# Patient Record
Sex: Female | Born: 1970 | Race: Black or African American | Hispanic: No | Smoking: Never smoker
Health system: Southern US, Community
[De-identification: ages and names within clinical notes are randomized; demographics above are authoritative.]

## PROBLEM LIST (undated history)

## (undated) DIAGNOSIS — Z8742 Personal history of other diseases of the female genital tract: Secondary | ICD-10-CM

## (undated) DIAGNOSIS — IMO0002 Reserved for concepts with insufficient information to code with codable children: Secondary | ICD-10-CM

## (undated) DIAGNOSIS — M329 Systemic lupus erythematosus, unspecified: Secondary | ICD-10-CM

## (undated) DIAGNOSIS — J329 Chronic sinusitis, unspecified: Secondary | ICD-10-CM

## (undated) HISTORY — PX: OTHER SURGICAL HISTORY: SHX169

## (undated) HISTORY — PX: DILATION AND CURETTAGE OF UTERUS: SHX78

## (undated) HISTORY — DX: Reserved for concepts with insufficient information to code with codable children: IMO0002

## (undated) HISTORY — DX: Chronic sinusitis, unspecified: J32.9

## (undated) HISTORY — PX: TONSILLECTOMY: SUR1361

## (undated) HISTORY — DX: Personal history of other diseases of the female genital tract: Z87.42

## (undated) HISTORY — DX: Systemic lupus erythematosus, unspecified: M32.9

---

## 1998-03-28 ENCOUNTER — Ambulatory Visit (HOSPITAL_COMMUNITY): Admission: RE | Admit: 1998-03-28 | Discharge: 1998-03-28 | Payer: Self-pay | Admitting: Internal Medicine

## 1998-05-17 ENCOUNTER — Emergency Department (HOSPITAL_COMMUNITY): Admission: EM | Admit: 1998-05-17 | Discharge: 1998-05-17 | Payer: Self-pay | Admitting: Emergency Medicine

## 2001-08-05 ENCOUNTER — Other Ambulatory Visit: Admission: RE | Admit: 2001-08-05 | Discharge: 2001-08-05 | Payer: Self-pay | Admitting: Obstetrics & Gynecology

## 2003-07-24 ENCOUNTER — Other Ambulatory Visit: Admission: RE | Admit: 2003-07-24 | Discharge: 2003-07-24 | Payer: Self-pay | Admitting: Obstetrics & Gynecology

## 2004-08-30 ENCOUNTER — Encounter: Admission: RE | Admit: 2004-08-30 | Discharge: 2004-08-30 | Payer: Self-pay | Admitting: Gastroenterology

## 2005-08-29 ENCOUNTER — Other Ambulatory Visit: Admission: RE | Admit: 2005-08-29 | Discharge: 2005-08-29 | Payer: Self-pay | Admitting: Family Medicine

## 2006-11-20 ENCOUNTER — Ambulatory Visit (HOSPITAL_COMMUNITY): Admission: RE | Admit: 2006-11-20 | Discharge: 2006-11-20 | Payer: Self-pay | Admitting: Obstetrics & Gynecology

## 2006-11-20 ENCOUNTER — Encounter (INDEPENDENT_AMBULATORY_CARE_PROVIDER_SITE_OTHER): Payer: Self-pay | Admitting: Obstetrics & Gynecology

## 2008-01-18 ENCOUNTER — Ambulatory Visit (HOSPITAL_COMMUNITY): Admission: RE | Admit: 2008-01-18 | Discharge: 2008-01-18 | Payer: Self-pay | Admitting: Gastroenterology

## 2008-03-25 ENCOUNTER — Ambulatory Visit: Payer: Self-pay | Admitting: Interventional Radiology

## 2008-03-25 ENCOUNTER — Emergency Department (HOSPITAL_BASED_OUTPATIENT_CLINIC_OR_DEPARTMENT_OTHER): Admission: EM | Admit: 2008-03-25 | Discharge: 2008-03-25 | Payer: Self-pay | Admitting: Emergency Medicine

## 2008-06-05 ENCOUNTER — Ambulatory Visit: Payer: Self-pay | Admitting: Diagnostic Radiology

## 2008-06-05 ENCOUNTER — Emergency Department (HOSPITAL_BASED_OUTPATIENT_CLINIC_OR_DEPARTMENT_OTHER): Admission: EM | Admit: 2008-06-05 | Discharge: 2008-06-05 | Payer: Self-pay | Admitting: Emergency Medicine

## 2009-01-30 ENCOUNTER — Encounter: Admission: RE | Admit: 2009-01-30 | Discharge: 2009-01-30 | Payer: Self-pay | Admitting: Specialist

## 2009-02-21 ENCOUNTER — Encounter: Admission: RE | Admit: 2009-02-21 | Discharge: 2009-02-21 | Payer: Self-pay | Admitting: Family Medicine

## 2009-05-03 ENCOUNTER — Encounter: Admission: RE | Admit: 2009-05-03 | Discharge: 2009-05-03 | Payer: Self-pay | Admitting: Family Medicine

## 2009-05-16 ENCOUNTER — Encounter: Admission: RE | Admit: 2009-05-16 | Discharge: 2009-05-16 | Payer: Self-pay | Admitting: Neurological Surgery

## 2009-08-15 ENCOUNTER — Encounter: Admission: RE | Admit: 2009-08-15 | Discharge: 2009-08-15 | Payer: Self-pay | Admitting: Family Medicine

## 2010-03-30 ENCOUNTER — Emergency Department (HOSPITAL_BASED_OUTPATIENT_CLINIC_OR_DEPARTMENT_OTHER)
Admission: EM | Admit: 2010-03-30 | Discharge: 2010-03-30 | Payer: Self-pay | Source: Home / Self Care | Admitting: Emergency Medicine

## 2010-04-11 ENCOUNTER — Emergency Department (HOSPITAL_BASED_OUTPATIENT_CLINIC_OR_DEPARTMENT_OTHER)
Admission: EM | Admit: 2010-04-11 | Discharge: 2010-04-11 | Payer: Self-pay | Source: Home / Self Care | Admitting: Emergency Medicine

## 2010-04-21 ENCOUNTER — Encounter: Payer: Self-pay | Admitting: Specialist

## 2010-04-21 ENCOUNTER — Encounter: Payer: Self-pay | Admitting: Family Medicine

## 2010-08-13 NOTE — Op Note (Signed)
NAME:  Jennifer Erickson, Jennifer Erickson        ACCOUNT NO.:  [id number]   MEDICAL RECORD NO.:  [id number]          PATIENT TYPE:  AMB   LOCATION:  SDC                           FACILITY:  [facility]   PHYSICIAN:  Genia Del, M.D.DATE OF BIRTH:  January 25, 1971   DATE OF PROCEDURE:  11/20/2006  DATE OF DISCHARGE:                               OPERATIVE REPORT   PREOPERATIVE DIAGNOSIS:  Menorrhagia with thick endometrium.   POSTOPERATIVE DIAGNOSIS:  Menorrhagia with thick endometrium.   PROCEDURE:  Hysteroscopy with dilatation and curettage.   SURGEON:  Genia Del, M.D.   ASSISTANT:  None.   ANESTHESIOLOGIST:  Angelica Pou, M.D.   DESCRIPTION OF PROCEDURE:  Under general anesthesia with endotracheal  intubation, the patient was placed in the lithotomy position.  She was  prepped with Betadine on the suprapubic, vulvar, and vaginal areas.  The  bladder is catheterized and the patient is draped as usual.  Vaginal  exam under general anesthesia reveals an anteverted uterus, mobile,  normal volume, no adnexal mass.  The cervix is long and closed.  No  vaginal bleeding is present.  We introduce the speculum in the vagina.  The anterior lip of the cervix is grasped with a tenaculum.  We proceed  with dilatation of the cervix with Hegar dilators up to #31 without  difficulty.  We then insert the diagnostic hysteroscope in the  intrauterine cavity.  The intrauterine cavity is inspected thoroughly.  Pictures are taken.  Both ostia are seen.  No frank lesion is present.  No evidence of myoma.  No large polyp. The endometrium is thick, small  polyps could be present within the thick endometrium. We decide to  proceed with curettage.  We, therefore, removed the hysteroscope.  We  used a sharp curet and systematically curettaged the entire intrauterine  cavity.  Endometrial curettings are sent to pathology.  We then go back  with the hysteroscope and visualize the entire intrauterine cavity.  No  lesion is visible. Pictures are taken of the intrauterine cavity and  also of the endocervix. Hemostasis is adequate.  All instruments are  removed. The estimated blood loss was minimal.  The fluid deficit was 20  mL.  No complications occurred.  The patient was brought to the recovery  room in good stable status.      Genia Del, M.D.  Electronically Signed     ML/MEDQ  D:  11/20/2006  T:  11/21/2006  Job:  [id number]

## 2011-01-10 LAB — PREGNANCY, URINE: Preg Test, Ur: NEGATIVE

## 2011-01-10 LAB — CBC
RBC: 4.35
WBC: 11.1 — ABNORMAL HIGH

## 2011-03-14 ENCOUNTER — Other Ambulatory Visit (HOSPITAL_COMMUNITY): Payer: Self-pay | Admitting: Otolaryngology

## 2011-03-14 ENCOUNTER — Other Ambulatory Visit (HOSPITAL_COMMUNITY): Payer: Self-pay | Admitting: *Deleted

## 2011-03-14 DIAGNOSIS — R4702 Dysphasia: Secondary | ICD-10-CM

## 2011-03-14 DIAGNOSIS — M542 Cervicalgia: Secondary | ICD-10-CM

## 2011-03-14 DIAGNOSIS — R220 Localized swelling, mass and lump, head: Secondary | ICD-10-CM

## 2011-03-17 ENCOUNTER — Inpatient Hospital Stay (HOSPITAL_COMMUNITY): Admission: RE | Admit: 2011-03-17 | Payer: Self-pay | Source: Ambulatory Visit

## 2011-03-17 ENCOUNTER — Other Ambulatory Visit (HOSPITAL_COMMUNITY): Payer: Self-pay

## 2011-03-17 ENCOUNTER — Ambulatory Visit (HOSPITAL_COMMUNITY): Payer: Self-pay

## 2011-07-16 ENCOUNTER — Encounter: Payer: Self-pay | Admitting: *Deleted

## 2011-07-16 ENCOUNTER — Emergency Department (INDEPENDENT_AMBULATORY_CARE_PROVIDER_SITE_OTHER)
Admission: EM | Admit: 2011-07-16 | Discharge: 2011-07-16 | Disposition: A | Discharge status: Home / Self Care | Payer: Federal, State, Local not specified - PPO | Source: Home / Self Care | Attending: Family Medicine | Admitting: Family Medicine

## 2011-07-16 DIAGNOSIS — R109 Unspecified abdominal pain: Secondary | ICD-10-CM

## 2011-07-16 DIAGNOSIS — R110 Nausea: Secondary | ICD-10-CM

## 2011-07-16 LAB — POCT URINALYSIS DIPSTICK
Bilirubin, UA: NEGATIVE
Ketones, UA: NEGATIVE
Leukocytes, UA: NEGATIVE
Nitrite, UA: NEGATIVE

## 2011-07-16 LAB — POCT CBC W AUTO DIFF (K'VILLE URGENT CARE)

## 2011-07-16 LAB — POCT URINE PREGNANCY: Preg Test, Ur: NEGATIVE

## 2011-07-16 MED ORDER — SULFAMETHOXAZOLE-TRIMETHOPRIM 800-160 MG PO TABS: 1.0000 | ORAL_TABLET | Freq: Two times a day (BID) | ORAL | Status: AC

## 2011-07-16 MED ORDER — ONDANSETRON HCL 4 MG PO TABS: 4.0000 mg | ORAL_TABLET | Freq: Four times a day (QID) | ORAL | Status: AC

## 2011-07-16 MED ORDER — METRONIDAZOLE 500 MG PO TABS: ORAL_TABLET | ORAL | Status: DC

## 2011-07-16 NOTE — ED Notes (Signed)
Patient c/o nausea, abdominal cramps and diarrhea x 2 days. She has not eaten anything today but has had fluids.

## 2011-07-16 NOTE — Discharge Instructions (Signed)
Begin clear liquids for 24 hours, then advance to BRAT diet if tolerated.  Gradually advance to a regular diet. If symptoms become significantly worse during the night or over the weekend, proceed to the local emergency room.   Clear Liquid Diet The clear liquid dietconsists of foods that are liquid or will become liquid at room temperature.You should be able to see through the liquid and beverages. Examples of foods allowed on a clear liquid diet include fruit juice, broth or bouillon, gelatin, or frozen ice pops. The purpose of this diet is to provide necessary fluid, electrolytes such as sodium and potassium, and energy to keep the body functioning during times when you are not able to consume a regular diet.A clear liquid diet should not be continued for long periods of time as it is not nutritionally adequate.  REASONS FOR USING A CLEAR LIQUID DIET  In sudden onset (acute) conditions for a patient before or after surgery.   As the first step in oral feeding.   For fluid and electrolyte replacement in diarrheal diseases.   As a diet before certain medical tests are performed.  ADEQUACY The clear liquid diet is adequate only in ascorbic acid, according to the Recommended Dietary Allowances of the [employer]. CHOOSING FOODS Breads and Starches  Allowed:  None are allowed.   Avoid: All are avoided.  Vegetables  Allowed:  Strained tomato or vegetable juice.   Avoid: Any others.  Fruit  Allowed:  Strained fruit juices and fruit drinks. Include 1 serving of citrus or vitamin C-enriched fruit juice daily.   Avoid: Any others.  Meat and Meat Substitutes  Allowed:  None are allowed.   Avoid: All are avoided.  Milk  Allowed:  None are allowed.   Avoid: All are avoided.  Soups and Combination Foods  Allowed:  Clear bouillon, broth, or strained broth-based soups.   Avoid: Any others.  Desserts and Sweets  Allowed:  Sugar, honey. High protein gelatin.  Flavored gelatin, ices, or frozen ice pops that do not contain milk.   Avoid: Any others.  Fats and Oils  Allowed:  None are allowed.   Avoid: All are avoided.  Beverages  Allowed: Cereal beverages, coffee (regular or decaffeinated), tea, or soda at the discretion of your caregiver.   Avoid: Any others.  Condiments  Allowed:  Iodized salt.   Avoid: Any others, including pepper.  Supplements  Allowed:  Liquid nutrition beverages.   Avoid: Any others that contain lactose or fiber.  SAMPLE MEAL PLAN Breakfast  4 oz (120 mL) strained orange juice.    to 1 cup (125 to 250 mL) gelatin (plain or fortified).   1 cup (250 mL) beverage (coffee or tea).   Sugar, if desired.  Midmorning Snack   cup (125 mL) gelatin (plain or fortified).  Lunch  1 cup (250 mL) broth or consomm.   4 oz (120 mL) strained grapefruit juice.    cup (125 mL) gelatin (plain or fortified).   1 cup (250 mL) beverage (coffee or tea).   Sugar, if desired.  Midafternoon Snack   cup (125 mL) fruit ice.    cup (125 mL) strained fruit juice.  Dinner  1 cup (250 mL) broth or consomm.    cup (125 mL) cranberry juice.    cup (125 mL) flavored gelatin (plain or fortified).   1 cup (250 mL) beverage (coffee or tea).   Sugar, if desired.  Evening Snack  4 oz (120 mL) strained apple juice (  vitamin C-fortified).    cup (125 mL) flavored gelatin (plain or fortified).  Document Released: 03/17/2005 Document Revised: 03/06/2011 Document Reviewed: 06/14/2010 [facility] Patient Information 2012 [place], [place].   B.R.A.T. Diet Your doctor has recommended the B.R.A.T. diet for you or your child until the condition improves. This is often used to help control diarrhea and vomiting symptoms. If you or your child can tolerate clear liquids, you may have:  Bananas.   Rice.   Applesauce.   Toast (and other simple starches such as crackers, potatoes, noodles).  Be sure to avoid dairy  products, meats, and fatty foods until symptoms are better. Fruit juices such as apple, grape, and prune juice can make diarrhea worse. Avoid these. Continue this diet for 2 days or as instructed by your caregiver. Document Released: 03/17/2005 Document Revised: 03/06/2011 Document Reviewed: 09/03/2006 [facility] Patient Information 2012 [place], [place].    Abdominal Pain Abdominal pain can be caused by many things. Your caregiver decides the seriousness of your pain by an examination and possibly blood tests and X-rays. Many cases can be observed and treated at home. Most abdominal pain is not caused by a disease and will probably improve without treatment. However, in many cases, more time must pass before a clear cause of the pain can be found. Before that point, it may not be known if you need more testing, or if hospitalization or surgery is needed. HOME CARE INSTRUCTIONS   Do not take laxatives unless directed by your caregiver.   Take pain medicine only as directed by your caregiver.   Only take over-the-counter or prescription medicines for pain, discomfort, or fever as directed by your caregiver.   Try a clear liquid diet (broth, tea, or water) for as long as directed by your caregiver. Slowly move to a bland diet as tolerated.  SEEK IMMEDIATE MEDICAL CARE IF:   The pain does not go away.   You have a fever.   You keep throwing up (vomiting).   The pain is felt only in portions of the abdomen. Pain in the right side could possibly be appendicitis. In an adult, pain in the left lower portion of the abdomen could be colitis or diverticulitis.   You pass bloody or black tarry stools.  MAKE SURE YOU:   Understand these instructions.   Will watch your condition.   Will get help right away if you are not doing well or get worse.  Document Released: 12/25/2004 Document Revised: 03/06/2011 Document Reviewed: 11/03/2007 [facility] Patient Information 2012 [place], [place].

## 2011-07-16 NOTE — ED Provider Notes (Signed)
History     CSN: [id number]  Arrival date & time 07/16/11  1613   First MD Initiated Contact with Patient 07/16/11 1634      Chief Complaint  Patient presents with  . Diarrhea  . Nausea     HPI Comments: Patient complains of onset of lower abdominal cramps 2 days ago with tenesmus, but no diarrhea.  She has had several bowel movements that were small but otherwise normal.  Today she developed nausea without vomiting.  No fevers, chills, and sweats.  No urinary symptoms.  She states that she normally does not have menstrual periods (on estrostep)  Patient is a 41 y.o. female presenting with abdominal pain. The history is provided by the patient.  Abdominal Pain The primary symptoms of the illness include abdominal pain, fatigue and nausea. The primary symptoms of the illness do not include fever, vomiting, diarrhea or dysuria. The current episode started 2 days ago. The onset of the illness was sudden.  The patient states that she believes she is currently not pregnant. The patient has had a change in bowel habit. Additional symptoms associated with the illness include anorexia. Symptoms associated with the illness do not include chills, diaphoresis, heartburn, constipation, urgency, hematuria, frequency or back pain.    History reviewed. No pertinent past medical history.  Past Surgical History  Procedure Date  . Dilation and curettage of uterus   . Tonsillectomy     History reviewed. No pertinent family history.  History  Substance Use Topics  . Smoking status: Never Smoker   . Smokeless tobacco: Not on file  . Alcohol Use: No    OB History    Grav Para Term Preterm Abortions TAB SAB Ect Mult Living                  Review of Systems  Constitutional: Positive for appetite change and fatigue. Negative for fever, chills and diaphoresis.  HENT: Negative.   Eyes: Negative.   Respiratory: Negative.   Cardiovascular: Negative.   Gastrointestinal: Positive for nausea,  abdominal pain, abdominal distention and anorexia. Negative for heartburn, vomiting, diarrhea, constipation, blood in stool and rectal pain.  Genitourinary: Negative for dysuria, urgency, frequency, hematuria, flank pain, decreased urine volume, vaginal pain and pelvic pain.  Musculoskeletal: Negative.  Negative for back pain.  Skin: Negative.   Neurological: Negative for headaches.    Allergies  Aspirin  Home Medications   Current Outpatient Rx  Name Route Sig Dispense Refill  . NORETHINDRON-ETHINYL ESTRAD-FE 1-20/1-30/1-35 MG-MCG PO TABS Oral Take 1 tablet by mouth daily.    [name] METRONIDAZOLE 500 MG PO TABS  Take one tab by mouth Q6hr 28 tablet 0  . ONDANSETRON HCL 4 MG PO TABS Oral Take 1 tablet (4 mg total) by mouth every 6 (six) hours. as needed for nausea 12 tablet 0  . SULFAMETHOXAZOLE-TRIMETHOPRIM 800-160 MG PO TABS Oral Take 1 tablet by mouth 2 (two) times daily. 14 tablet 0    BP 126/87  Pulse 88  Temp(Src) 98.5 F (36.9 C) (Oral)  Resp 16  Ht 5\' 6"  (1.676 m)  Wt 274 lb (124.286 kg)  BMI 44.22 kg/m2  SpO2 97%  Physical Exam Nursing notes and Vital Signs reviewed. Appearance:  Patient appears stated age, and in no acute distress.  Patient is obese (BMI 44.3)  Eyes:  Pupils are equal, round, and reactive to light and accomodation.  Extraocular movement is intact.  Conjunctivae are not inflamed  Pharynx:  Normal Neck:  Supple.  No adenopathy Lungs:  Clear to auscultation.  Breath sounds are equal.  Heart:  Regular rate and rhythm without murmurs, rubs, or gallops.  Abdomen:   Obese.  No masses or hepatosplenomegaly.  Bowel sounds are present.  There is tenderness in the hypogastric regions bilaterally.  No rebound tenderness.  No CVA or flank tenderness.  Extremities:  No edema.  No calf tenderness Skin:  No rash present.   ED Course  Procedures none   Labs Reviewed  POCT CBC W AUTO DIFF ([facility]) :  WBC 7.0; LY 48.2; MO 2.8; GR 49.0; Hgb 13.6;  Platelets 271   POCT URINALYSIS DIPSTICK trace blood, trace prot, SG = 1.025, otherwise negative  POCT URINE PREGNANCY negative      1. Abdominal pain; ? Diverticulitis early  2. Nausea alone       MDM   Begin Septra DS and Flagyl for one week.  Zofran for nausea. Begin clear liquids for 24 hours, then advance to BRAT diet if tolerated.  Gradually advance to a regular diet. If symptoms become significantly worse during the night or over the weekend, proceed to the local emergency room. Followup with Family Doctor if not improved in 3 days.         Lattie Haw, MD 07/19/11 [phone]

## 2011-07-28 ENCOUNTER — Emergency Department
Admission: EM | Admit: 2011-07-28 | Discharge: 2011-07-28 | Disposition: A | Discharge status: Home / Self Care | Payer: Federal, State, Local not specified - PPO | Source: Home / Self Care

## 2011-07-28 DIAGNOSIS — R059 Cough, unspecified: Secondary | ICD-10-CM

## 2011-07-28 DIAGNOSIS — R05 Cough: Secondary | ICD-10-CM

## 2011-07-28 LAB — POCT INFLUENZA A/B
Influenza A, POC: NEGATIVE
Influenza B, POC: NEGATIVE

## 2011-07-28 MED ORDER — FLUTICASONE PROPIONATE 50 MCG/ACT NA SUSP: 2.0000 | Freq: Every day | NASAL | Status: DC

## 2011-07-28 MED ORDER — METHYLPREDNISOLONE ACETATE 80 MG/ML IJ SUSP
80.0000 mg | Freq: Once | INTRAMUSCULAR | Status: AC
  Administered 2011-07-28: 80 mg via INTRAMUSCULAR

## 2011-07-28 MED ORDER — CETIRIZINE HCL 10 MG PO CAPS: 10.0000 mg | ORAL_CAPSULE | Freq: Every day | ORAL | Status: DC

## 2011-07-28 NOTE — Discharge Instructions (Signed)
Viremia Your exam shows you have a viral illness. Viremia means your symptoms are due to the presence of the virus in your blood. This will often cause a chill or sweat. Other common symptoms of viral infections include fever, muscle aches, headache, fatigue, stomach upsets, sore throat, and dry cough. Antibiotics are not effective in viral illnesses; they are usually only given when there is a secondary bacterial infection. General treatment includes bed rest, increasing oral fluid intake of clear, non-caffeinated drinks like ginger ale, fruit juices, water, or sports drinks. Medicines to relieve specific symptoms such as cough, pain, or diarrhea may also be prescribed. Only take over-the-counter or prescription medicines for pain, discomfort, or fever as directed by your caregiver.  Please call your doctor if you are not better after 2 to 3 days of symptom treatment. Call or return here right away if your illness gets more severe, or you develop any other new symptoms, such as a fever above 103 F (39.4 C), vomiting for more than a day, severe headache or other pain, stiff neck, trouble breathing, visual problems, "blackouts" or fainting. Document Released: 04/24/2004 Document Revised: 03/06/2011 Document Reviewed: 03/17/2005 [facility] Patient Information 2012 [place], [place].  Allergic Rhinitis Allergic rhinitis is when the mucous membranes in the nose respond to allergens. Allergens are particles in the air that cause your body to have an allergic reaction. This causes you to release allergic antibodies. Through a chain of events, these eventually cause you to release histamine into the blood stream (hence the use of antihistamines). Although meant to be protective to the body, it is this release that causes your discomfort, such as frequent sneezing, congestion and an itchy runny nose.  CAUSES  The pollen allergens may come from grasses, trees, and weeds. This is seasonal allergic rhinitis, or "hay  fever." Other allergens cause year-round allergic rhinitis (perennial allergic rhinitis) such as house dust mite allergen, pet dander and mold spores.  SYMPTOMS   Nasal stuffiness (congestion).   Runny, itchy nose with sneezing and tearing of the eyes.   There is often an itching of the mouth, eyes and ears.  It cannot be cured, but it can be controlled with medications. DIAGNOSIS  If you are unable to determine the offending allergen, skin or blood testing may find it. TREATMENT   Avoid the allergen.   Medications and allergy shots (immunotherapy) can help.   Hay fever may often be treated with antihistamines in pill or nasal spray forms. Antihistamines block the effects of histamine. There are over-the-counter medicines that may help with nasal congestion and swelling around the eyes. Check with your caregiver before taking or giving this medicine.  If the treatment above does not work, there are many new medications your caregiver can prescribe. Stronger medications may be used if initial measures are ineffective. Desensitizing injections can be used if medications and avoidance fails. Desensitization is when a patient is given ongoing shots until the body becomes less sensitive to the allergen. Make sure you follow up with your caregiver if problems continue. SEEK MEDICAL CARE IF:   You develop fever (more than 100.5 F (38.1 C).   You develop a cough that does not stop easily (persistent).   You have shortness of breath.   You start wheezing.   Symptoms interfere with normal daily activities.  Document Released: 12/10/2000 Document Revised: 03/06/2011 Document Reviewed: 06/21/2008 [facility] Patient Information 2012 [place], [place].

## 2011-07-28 NOTE — ED Notes (Signed)
Symptoms started on Tuesday w/out relief from OTC meds

## 2011-07-28 NOTE — ED Provider Notes (Signed)
History     CSN: [id number]  Arrival date & time 07/28/11  1949   First MD Initiated Contact with Patient 07/28/11 1952      Chief Complaint  Patient presents with  . URI    (Consider location/radiation/quality/duration/timing/severity/associated sxs/prior treatment) HPI Comments: URI Symptoms Onset: 6 days  Description: rhinorrhea, nasal congestion, cough, swollen lymph nodes  Modifying factors:  None   Symptoms Nasal discharge: yes  Fever: no Sore throat: yes Cough: yes Wheezing: no Ear pain: no GI symptoms: no Sick contacts: yes  Red Flags  Stiff neck: no Dyspnea: no Rash: no Swallowing difficulty: no  Sinusitis Risk Factors Headache/face pain: no Double sickening: no tooth pain: no  Allergy Risk Factors Sneezing: no Itchy scratchy throat: yes Seasonal symptoms: yes  Flu Risk Factors Headache: yes muscle aches: no severe fatigue: yes + chills   -Pt has used claritin with mild to moderate improvement in sxs.    Patient is a 41 y.o. female presenting with URI. The history is provided by the patient.  URI The primary symptoms include fatigue, swollen glands and cough. Primary symptoms do not include fever. The current episode started 6 to 7 days ago. This is a new problem.  Symptoms associated with the illness include congestion and rhinorrhea.    History reviewed. No pertinent past medical history.  Past Surgical History  Procedure Date  . Dilation and curettage of uterus   . Tonsillectomy     History reviewed. No pertinent family history.  History  Substance Use Topics  . Smoking status: Never Smoker   . Smokeless tobacco: Not on file  . Alcohol Use: No    OB History    Grav Para Term Preterm Abortions TAB SAB Ect Mult Living                  Review of Systems  Constitutional: Positive for fatigue. Negative for fever.  HENT: Positive for congestion, rhinorrhea and postnasal drip.   Eyes: Positive for itching.  Respiratory:  Positive for cough.   Cardiovascular: Negative for palpitations.    Allergies  Aspirin  Home Medications   Current Outpatient Rx  Name Route Sig Dispense Refill  . CETIRIZINE HCL 10 MG PO CAPS Oral Take 1 capsule (10 mg total) by mouth daily. 30 capsule 2  . FLUTICASONE PROPIONATE 50 MCG/ACT NA SUSP Nasal Place 2 sprays into the nose daily. 16 g 2  . METRONIDAZOLE 500 MG PO TABS  Take one tab by mouth Q6hr 28 tablet 0  . NORETHINDRON-ETHINYL ESTRAD-FE 1-20/1-30/1-35 MG-MCG PO TABS Oral Take 1 tablet by mouth daily.      BP 134/90  Pulse 93  Temp(Src) 98.4 F (36.9 C) (Oral)  Resp 24  Ht 5\' 6"  (1.676 m)  Wt 271 lb (122.925 kg)  BMI 43.74 kg/m2  SpO2 97%  Physical Exam  Constitutional: She appears well-developed and well-nourished.  HENT:  Head: Normocephalic and atraumatic.       +nasal erythema, rhinorrhea bilaterally, + post oropharyngeal erythema    Eyes: Conjunctivae are normal. Pupils are equal, round, and reactive to light.  Neck:       Large neck girth   Cardiovascular: Normal rate and regular rhythm.   Pulmonary/Chest: Effort normal and breath sounds normal.  Musculoskeletal: Normal range of motion.  Lymphadenopathy:    She has cervical adenopathy.  Neurological: She is alert.  Skin: Skin is warm. No pallor.    ED Course  Procedures (including critical care time)  Labs  Reviewed - No data to display No results found.   No diagnosis found.    MDM  Likely viral illness with overlapping allergic sxs. Centor criteria score of 1. Discussed supportive care, infectious, and resp red flags. Zyrtec and flonase for allergic rhinitis. Depo medrol 80mg  IM x1 to help sinus inflammation. F/u w/ PCP in 2-3 days if not improved.         Floydene Flock, MD 07/28/11 2014  Floydene Flock, MD 07/28/11 2017

## 2011-07-29 NOTE — ED Provider Notes (Signed)
Agree with exam, assessment, and plan.   Lattie Haw, MD 07/29/11 [phone]

## 2011-08-05 ENCOUNTER — Emergency Department
Admission: EM | Admit: 2011-08-05 | Discharge: 2011-08-05 | Disposition: A | Discharge status: Home / Self Care | Payer: Federal, State, Local not specified - PPO | Source: Home / Self Care | Attending: Emergency Medicine | Admitting: Emergency Medicine

## 2011-08-05 DIAGNOSIS — J329 Chronic sinusitis, unspecified: Secondary | ICD-10-CM

## 2011-08-05 DIAGNOSIS — J069 Acute upper respiratory infection, unspecified: Secondary | ICD-10-CM

## 2011-08-05 MED ORDER — AMOXICILLIN-POT CLAVULANATE 875-125 MG PO TABS: 1.0000 | ORAL_TABLET | Freq: Two times a day (BID) | ORAL | Status: AC

## 2011-08-05 MED ORDER — FLUTICASONE PROPIONATE 50 MCG/ACT NA SUSP: 2.0000 | Freq: Every day | NASAL | Status: DC

## 2011-08-05 MED ORDER — LORATADINE-PSEUDOEPHEDRINE ER 5-120 MG PO TB12: 1.0000 | ORAL_TABLET | Freq: Two times a day (BID) | ORAL | Status: DC

## 2011-08-05 NOTE — ED Provider Notes (Signed)
History     CSN: [id number]  Arrival date & time 08/05/11  1404   First MD Initiated Contact with Patient 08/05/11 1412      Chief Complaint  Patient presents with  . Sinus Problem    (Consider location/radiation/quality/duration/timing/severity/associated sxs/prior treatment) HPI Jennifer Erickson is a 41 y.o. female who complains of onset of cold symptoms for 2-3 weeks.  The symptoms are constant and mild-moderate in severity.  She was seen here 8 days ago with very similar symptoms.  She was diagnosed with a viral illness with overlapping allergic symptoms.  She was given a shot of Depo-Medrol and told to followup with her PCP in 2-3 days if she is not getting better.  She cannot do that.  She feels that she is getting worse. No sore throat + cough No pleuritic pain No wheezing ++ nasal congestion ++ post-nasal drainage ++ sinus pain/pressure No chest congestion No itchy/red eyes No earache No hemoptysis No SOB No chills/sweats No fever No nausea No vomiting No abdominal pain No diarrhea No skin rashes + fatigue No myalgias + headache   No past medical history on file.  Past Surgical History  Procedure Date  . Dilation and curettage of uterus   . Tonsillectomy     No family history on file.  History  Substance Use Topics  . Smoking status: Never Smoker   . Smokeless tobacco: Not on file  . Alcohol Use: No    OB History    Grav Para Term Preterm Abortions TAB SAB Ect Mult Living                  Review of Systems  All other systems reviewed and are negative.    Allergies  Aspirin  Home Medications   Current Outpatient Rx  Name Route Sig Dispense Refill  . AMOXICILLIN-POT CLAVULANATE 875-125 MG PO TABS Oral Take 1 tablet by mouth 2 (two) times daily. 20 tablet 0  . CETIRIZINE HCL 10 MG PO CAPS Oral Take 1 capsule (10 mg total) by mouth daily. 30 capsule 2  . FLUTICASONE PROPIONATE 50 MCG/ACT NA SUSP Nasal Place 2 sprays into the nose daily. 16 g 2  .  FLUTICASONE PROPIONATE 50 MCG/ACT NA SUSP Nasal Place 2 sprays into the nose daily. 16 g 1  . LORATADINE-PSEUDOEPHEDRINE ER 5-120 MG PO TB12 Oral Take 1 tablet by mouth 2 (two) times daily. 10 tablet 0  . METRONIDAZOLE 500 MG PO TABS  Take one tab by mouth Q6hr 28 tablet 0  . NORETHINDRON-ETHINYL ESTRAD-FE 1-20/1-30/1-35 MG-MCG PO TABS Oral Take 1 tablet by mouth daily.      BP 121/89  Pulse 84  Temp(Src) 98.3 F (36.8 C) (Oral)  Resp 16  Ht 5\' 6"  (1.676 m)  Wt 275 lb (124.739 kg)  BMI 44.39 kg/m2  SpO2 97%  Physical Exam  Nursing note and vitals reviewed. Constitutional: She is oriented to person, place, and time. She appears well-developed and well-nourished.  HENT:  Head: Normocephalic and atraumatic.  Right Ear: Tympanic membrane, external ear and ear canal normal.  Left Ear: Tympanic membrane, external ear and ear canal normal.  Nose: Mucosal edema and rhinorrhea present.  Mouth/Throat: Posterior oropharyngeal erythema (post nasal drip) present. No oropharyngeal exudate or posterior oropharyngeal edema.  Eyes: No scleral icterus.  Neck: Neck supple.  Cardiovascular: Regular rhythm and normal heart sounds.   Pulmonary/Chest: Effort normal and breath sounds normal. No respiratory distress.  Neurological: She is alert and oriented to person, place,  and time.  Skin: Skin is warm and dry.  Psychiatric: She has a normal mood and affect. Her speech is normal.    ED Course  Procedures (including critical care time)  Labs Reviewed - No data to display No results found.   1. Acute upper respiratory infections of unspecified site   2. Sinusitis       MDM  1) this is probably a combination of mild sinusitis and a lingering allergic rhinitis. Take the prescribed antibiotic as instructed.  I also gave her prescription for Flonase and Claritin-D.  After finished with Claritin-D, switch to generic Claritin.  If she is not improving in 2 weeks, she is to followup with an ENT since  she has been having symptoms for the last 3 months and not improving on multiple modalities. 2)  Use nasal saline solution (over the counter) at least 3 times a day. 3)  Can take tylenol every 6 hours or motrin every 8 hours for pain or fever. 4)  Follow up with your primary doctor if no improvement in 5-7 days, sooner if increasing pain, fever, or new symptoms.     Marlaine Hind, MD 08/05/11 [id number]

## 2011-08-05 NOTE — ED Notes (Signed)
Sinus congestion, cough, headache x 3 weeks

## 2011-08-06 ENCOUNTER — Telehealth: Payer: Self-pay | Admitting: *Deleted

## 2011-08-06 ENCOUNTER — Ambulatory Visit: Payer: Federal, State, Local not specified - PPO | Admitting: Physician Assistant

## 2011-08-19 ENCOUNTER — Encounter: Payer: Self-pay | Admitting: Family Medicine

## 2011-08-19 ENCOUNTER — Ambulatory Visit (INDEPENDENT_AMBULATORY_CARE_PROVIDER_SITE_OTHER): Payer: Federal, State, Local not specified - PPO | Admitting: Family Medicine

## 2011-08-19 VITALS — BP 114/63 | HR 93 | Temp 98.4°F | Ht 66.0 in | Wt 270.0 lb

## 2011-08-19 DIAGNOSIS — R519 Headache, unspecified: Secondary | ICD-10-CM

## 2011-08-19 DIAGNOSIS — K1370 Unspecified lesions of oral mucosa: Secondary | ICD-10-CM

## 2011-08-19 DIAGNOSIS — E669 Obesity, unspecified: Secondary | ICD-10-CM

## 2011-08-19 DIAGNOSIS — E66813 Obesity, class 3: Secondary | ICD-10-CM

## 2011-08-19 DIAGNOSIS — K2100 Gastro-esophageal reflux disease with esophagitis, without bleeding: Secondary | ICD-10-CM

## 2011-08-19 DIAGNOSIS — R51 Headache: Secondary | ICD-10-CM

## 2011-08-19 DIAGNOSIS — K1379 Other lesions of oral mucosa: Secondary | ICD-10-CM | POA: Insufficient documentation

## 2011-08-19 MED ORDER — PANTOPRAZOLE SODIUM 40 MG PO TBEC: 40.0000 mg | DELAYED_RELEASE_TABLET | Freq: Every day | ORAL | Status: DC

## 2011-08-19 MED ORDER — KETOROLAC TROMETHAMINE 60 MG/2ML IM SOLN
60.0000 mg | Freq: Once | INTRAMUSCULAR | Status: AC
  Administered 2011-08-19: 60 mg via INTRAMUSCULAR

## 2011-08-19 NOTE — Progress Notes (Signed)
Subjective:    Patient ID: Jennifer Erickson, female    DOB: 05-Jan-1971, 41 y.o.   MRN: [id number]  Headache  This is a new problem. The current episode started 1 to 4 weeks ago (10 days). The problem occurs daily. The pain is located in the bilateral region. The pain does not radiate. Similar to Prior Headaches: imaging study inthe last 6 months. Quality: sitting there. The pain is at a severity of 3/10. The pain is mild. Associated symptoms include a loss of balance and nausea. Pertinent negatives include no abdominal pain, abnormal behavior, anorexia, back pain, blurred vision, coughing, dizziness, drainage, ear pain, eye pain, eye redness, eye watering, facial sweating, fever, hearing loss, insomnia, muscle aches, neck pain, numbness, phonophobia, photophobia, rhinorrhea, scalp tenderness, seizures, sinus pressure, sore throat, swollen glands, tingling, tinnitus, visual change, vomiting, weakness or weight loss. The symptoms are aggravated by nothing. She has tried NSAIDs for the symptoms. The treatment provided no relief. Her past medical history is significant for obesity and sinus disease. There is no history of cancer, cluster headaches, hypertension, immunosuppression, migraine headaches, migraines in the family, pseudotumor cerebri, recent head traumas or TMJ.   She also reports trouble with uvula swelling. She states So bad that she is actually an EpiPen. She is seen a primary care for this problem before and allergist and an ears nose and throat doctor. She reports not getting along well with the ENT doctor who offered to cut out her uvula which she took [place] with. She states that the allergist prescribed an EpiPen for her. She's had episodes of snoring heavily and various people have some concern that her breathing would be cut off. She has never taken ACE's before. She doesn't think that reflux is a problem since she doesn't have any significant acid indigestion. She has had trouble for  her sinuses and this winter season she's had about 4 URI. She states the problems with uvula started about 4 years ago off and on but she's had trouble snoring most of her life and also coughing or heavily in the morning.  She is concerned about her weight and we talked about getting her in for physical in the near future..   Review of Systems  Constitutional: Negative for fever and weight loss.  HENT: Negative for hearing loss, ear pain, sore throat, rhinorrhea, neck pain, sinus pressure and tinnitus.        Swelling of the ulvla  Eyes: Negative for blurred vision, photophobia, pain and redness.  Respiratory: Negative for cough.   Gastrointestinal: Positive for nausea. Negative for vomiting, abdominal pain and anorexia.  Musculoskeletal: Negative for back pain.  Neurological: Positive for headaches and loss of balance. Negative for dizziness, tingling, seizures, weakness and numbness.  Psychiatric/Behavioral: The patient does not have insomnia.       BP 114/63  Pulse 93  Temp(Src) 98.4 F (36.9 C) (Oral)  Ht 5\' 6"  (1.676 m)  Wt 270 lb (122.471 kg)  BMI 43.58 kg/m2  SpO2 98% Objective:   Physical Exam  Vitals reviewed. Constitutional: She is oriented to person, place, and time. She appears well-developed and well-nourished. No distress.       obesse  HENT:  Head: Normocephalic and atraumatic.  Right Ear: External ear normal.  Left Ear: External ear normal.  Nose: Nose normal.  Mouth/Throat: Oropharynx is clear and moist.       Uvula today is normal no signs of significant swelling or edema present. Negative TMJ tenderness, occipital area tenderness,  or sinus tenderness  Eyes: Pupils are equal, round, and reactive to light.  Neck: Normal range of motion. Neck supple. No tracheal deviation present. No thyromegaly present.  Cardiovascular: Normal rate.   Pulmonary/Chest: No stridor.  Musculoskeletal: Normal range of motion.  Neurological: She is alert and oriented to person,  place, and time. She displays normal reflexes. No cranial nerve deficit.  Skin: Skin is warm. She is not diaphoretic. No erythema.  Psychiatric: She has a normal mood and affect.      Assessment & Plan:  #1 headache. We'll give her a Frova tablet see if this helps with the headache and a shot of Toradol. She's contact nursing staff if she's not better by tomorrow.  #2 uvula swelling which appears to be intermittent. I'm going to recommend at this time treated for reflux. We'll going to elevate head of bed place her on Protonix 40 mg a day and hopefully this will keep her from having uvula swelling. Explained to her that since the uvula is not swollen 24 7 surgery does not seem to be a worthwhile option at this time. She may use the EpiPen but does not mean that she has to go to the hospital for a doesn't mean that the EpiPen doesn't help then she should and must get medical attention at that time. #3 scheduled for return in 2 weeks for health maintenance and obesity evaluation.

## 2011-08-19 NOTE — Patient Instructions (Signed)

## 2011-08-28 ENCOUNTER — Ambulatory Visit (INDEPENDENT_AMBULATORY_CARE_PROVIDER_SITE_OTHER): Payer: Federal, State, Local not specified - PPO | Admitting: Family Medicine

## 2011-08-28 ENCOUNTER — Encounter: Payer: Self-pay | Admitting: Family Medicine

## 2011-08-28 VITALS — BP 116/78 | HR 83 | Temp 98.2°F | Ht 65.5 in | Wt 271.0 lb

## 2011-08-28 DIAGNOSIS — R51 Headache: Secondary | ICD-10-CM

## 2011-08-28 DIAGNOSIS — Z0000 Encounter for general adult medical examination without abnormal findings: Secondary | ICD-10-CM

## 2011-08-28 LAB — POCT URINALYSIS DIPSTICK
Bilirubin, UA: NEGATIVE
Glucose, UA: NEGATIVE
Leukocytes, UA: NEGATIVE
Nitrite, UA: NEGATIVE
Urobilinogen, UA: 0.2
pH, UA: 6

## 2011-08-28 MED ORDER — SUMATRIPTAN SUCCINATE 100 MG PO TABS: 100.0000 mg | ORAL_TABLET | ORAL | Status: DC | PRN

## 2011-08-28 MED ORDER — BUTALBITAL-APAP-CAFFEINE 50-325-40 MG PO TABS: 1.0000 | ORAL_TABLET | Freq: Two times a day (BID) | ORAL | Status: AC | PRN

## 2011-08-28 NOTE — Patient Instructions (Signed)

## 2011-08-28 NOTE — Progress Notes (Signed)
  Subjective:    Patient ID: Jennifer Erickson, female    DOB: [birth year], 41 y.o.   MRN: [id number]  HPI  Physical examination.  Review of Systems  All other systems reviewed and are negative.      ,BP 116/78  Pulse 83  Temp(Src) 98.2 F (36.8 C) (Oral)  Ht 5' 5.5" (1.664 m)  Wt 271 lb (122.925 kg)  BMI 44.41 kg/m2  SpO2 100% Objective:   Physical Exam  Vitals reviewed. Constitutional: She is oriented to person, place, and time. She appears well-developed and well-nourished.       Obese black female  HENT:  Head: Normocephalic and atraumatic.  Right Ear: External ear normal.  Left Ear: External ear normal.  Nose: Nose normal.       Uvula normal size today.  Eyes: Conjunctivae are normal. Pupils are equal, round, and reactive to light. Right eye exhibits no discharge. Left eye exhibits no discharge.  Neck: Normal range of motion. Neck supple. No tracheal deviation present. No thyromegaly present.  Cardiovascular: Normal rate, regular rhythm and normal heart sounds.  Exam reveals no friction rub.   No murmur heard. Pulmonary/Chest: Effort normal and breath sounds normal.  Abdominal: Soft. Bowel sounds are normal. She exhibits no distension. There is no tenderness. There is no rebound.  Musculoskeletal: Normal range of motion. She exhibits no edema.  Neurological: She is alert and oriented to person, place, and time. She has normal reflexes.  Skin: Skin is warm and dry.  Psychiatric: She has a normal mood and affect. Her behavior is normal.          Results for orders placed in visit on 08/28/11  POCT URINALYSIS DIPSTICK      Component Value Range   Color, UA yellow     Clarity, UA clear     Glucose, UA negative     Bilirubin, UA negative     Ketones, UA negative     Spec Grav, UA 1.025     Blood, UA trace-intact     pH, UA 6.0     Protein, UA negative     Urobilinogen, UA 0.2     Nitrite, UA negative     Leukocytes, UA Negative     Assessment & Plan:    #1 health maintenance. She see her Pap smear breast exam with her GYN. Lab work will be obtained at a later date when she is fasting and the lab is open. #2 history of amenorrhea. She is on low progesterone or minipill. #3 headaches. Prescription for Fioricet for tension headaches and Imitrex for migraines.  #4 hematuria. Mild recommend repeat urine in 4-6 months. #5 immunization status she does bring back reports of her last tetanus and Pap smear.  #6 obesity. Need to start exercising. Natural alternatives as his body key but in the next year we'll consider phentermine if so desired.

## 2011-09-01 ENCOUNTER — Encounter: Payer: Self-pay | Admitting: *Deleted

## 2011-09-01 ENCOUNTER — Other Ambulatory Visit (HOSPITAL_BASED_OUTPATIENT_CLINIC_OR_DEPARTMENT_OTHER): Payer: Self-pay | Admitting: Obstetrics & Gynecology

## 2011-09-01 DIAGNOSIS — Z1231 Encounter for screening mammogram for malignant neoplasm of breast: Secondary | ICD-10-CM

## 2011-09-02 ENCOUNTER — Ambulatory Visit (HOSPITAL_BASED_OUTPATIENT_CLINIC_OR_DEPARTMENT_OTHER)
Admission: RE | Admit: 2011-09-02 | Discharge: 2011-09-02 | Disposition: A | Discharge status: Home / Self Care | Payer: Federal, State, Local not specified - PPO | Source: Ambulatory Visit | Attending: Obstetrics & Gynecology | Admitting: Obstetrics & Gynecology

## 2011-09-02 DIAGNOSIS — Z1231 Encounter for screening mammogram for malignant neoplasm of breast: Secondary | ICD-10-CM

## 2011-09-03 LAB — COMPREHENSIVE METABOLIC PANEL
ALT: 29 U/L (ref 0–35)
AST: 18 U/L (ref 0–37)
Albumin: 3.8 g/dL (ref 3.5–5.2)
BUN: 13 mg/dL (ref 6–23)
Calcium: 9.3 mg/dL (ref 8.4–10.5)
Chloride: 104 mEq/L (ref 96–112)
Potassium: 3.9 mEq/L (ref 3.5–5.3)
Sodium: 138 mEq/L (ref 135–145)
Total Protein: 7.6 g/dL (ref 6.0–8.3)

## 2011-09-03 LAB — CBC WITH DIFFERENTIAL/PLATELET
Basophils Absolute: 0 10*3/uL (ref 0.0–0.1)
Basophils Relative: 0 % (ref 0–1)
Eosinophils Absolute: 0.1 10*3/uL (ref 0.0–0.7)
MCH: 29.7 pg (ref 26.0–34.0)
MCHC: 33.3 g/dL (ref 30.0–36.0)
Neutro Abs: 5.2 10*3/uL (ref 1.7–7.7)
Neutrophils Relative %: 58 % (ref 43–77)
Platelets: 347 10*3/uL (ref 150–400)
RDW: 14.2 % (ref 11.5–15.5)

## 2011-09-03 LAB — TSH: TSH: 1.229 u[IU]/mL (ref 0.350–4.500)

## 2011-09-03 LAB — HEMOGLOBIN A1C
Hgb A1c MFr Bld: 5.9 % — ABNORMAL HIGH (ref <5.7)
Mean Plasma Glucose: 123 mg/dL — ABNORMAL HIGH (ref <117)

## 2011-09-03 LAB — LIPID PANEL
LDL Cholesterol: 95 mg/dL (ref 0–99)
Total CHOL/HDL Ratio: 3.1 Ratio

## 2011-09-04 ENCOUNTER — Encounter: Payer: Self-pay | Admitting: Family Medicine

## 2011-09-08 ENCOUNTER — Ambulatory Visit
Admission: RE | Admit: 2011-09-08 | Discharge: 2011-09-08 | Disposition: A | Discharge status: Home / Self Care | Payer: Federal, State, Local not specified - PPO | Source: Ambulatory Visit | Attending: Physician Assistant | Admitting: Physician Assistant

## 2011-09-08 ENCOUNTER — Encounter: Payer: Self-pay | Admitting: Physician Assistant

## 2011-09-08 ENCOUNTER — Ambulatory Visit (INDEPENDENT_AMBULATORY_CARE_PROVIDER_SITE_OTHER): Payer: Federal, State, Local not specified - PPO | Admitting: Physician Assistant

## 2011-09-08 VITALS — BP 115/71 | HR 76 | Temp 98.2°F | Ht 65.5 in | Wt 268.0 lb

## 2011-09-08 DIAGNOSIS — M549 Dorsalgia, unspecified: Secondary | ICD-10-CM

## 2011-09-08 DIAGNOSIS — M542 Cervicalgia: Secondary | ICD-10-CM

## 2011-09-08 LAB — POCT URINALYSIS DIPSTICK
Blood, UA: NEGATIVE
Glucose, UA: NEGATIVE
Nitrite, UA: NEGATIVE
Protein, UA: NEGATIVE
Spec Grav, UA: >=1.03
Urobilinogen, UA: 0.2

## 2011-09-08 MED ORDER — CYCLOBENZAPRINE HCL 5 MG PO TABS: 5.0000 mg | ORAL_TABLET | Freq: Every evening | ORAL | Status: AC | PRN

## 2011-09-08 NOTE — Patient Instructions (Addendum)
Start taking Ibuprofen 800mg  three times a day. Take Flexeril 10mg  at bedtime. Use Ice. Will get x-rays of neck and call with results. Call in not improving in next week.  UA negative likely back pain due to muscle tightness.  Cervical Sprain A cervical sprain is an injury in the neck in which the ligaments are stretched or torn. The ligaments are the tissues that hold the bones of the neck (vertebrae) in place.Cervical sprains can range from very mild to very severe. Most cervical sprains get better in 1 to 3 weeks, but it depends on the cause and extent of the injury. Severe cervical sprains can cause the neck vertebrae to be unstable. This can lead to damage of the spinal cord and can result in serious nervous system problems. Your caregiver will determine whether your cervical sprain is mild or severe. CAUSES  Severe cervical sprains may be caused by:  Contact sport injuries (football, rugby, wrestling, hockey, auto racing, gymnastics, diving, martial arts, boxing).   Motor vehicle collisions.   Whiplash injuries. This means the neck is forcefully whipped backward and forward.   Falls.  Mild cervical sprains may be caused by:   Awkward positions, such as cradling a telephone between your ear and shoulder.   Sitting in a chair that does not offer proper support.   Working at a poorly Marketing executive station.   Activities that require looking up or down for long periods of time.  SYMPTOMS   Pain, soreness, stiffness, or a burning sensation in the front, back, or sides of the neck. This discomfort may develop immediately after injury or it may develop slowly and not begin for 24 hours or more after an injury.   Pain or tenderness directly in the middle of the back of the neck.   Shoulder or upper back pain.   Limited ability to move the neck.   Headache.   Dizziness.   Weakness, numbness, or tingling in the hands or arms.   Muscle spasms.   Difficulty swallowing or  chewing.   Tenderness and swelling of the neck.  DIAGNOSIS  Most of the time, your caregiver can diagnose this problem by taking your history and doing a physical exam. Your caregiver will ask about any known problems, such as arthritis in the neck or a previous neck injury. X-rays may be taken to find out if there are any other problems, such as problems with the bones of the neck. However, an X-ray often does not reveal the full extent of a cervical sprain. Other tests such as a computed tomography (CT) scan or magnetic resonance imaging (MRI) may be needed. TREATMENT  Treatment depends on the severity of the cervical sprain. Mild sprains can be treated with rest, keeping the neck in place (immobilization), and pain medicines. Severe cervical sprains need immediate immobilization and an appointment with an orthopedist or neurosurgeon. Several treatment options are available to help with pain, muscle spasms, and other symptoms. Your caregiver may prescribe:  Medicines, such as pain relievers, numbing medicines, or muscle relaxants.   Physical therapy. This can include stretching exercises, strengthening exercises, and posture training. Exercises and improved posture can help stabilize the neck, strengthen muscles, and help stop symptoms from returning.   A neck collar to be worn for short periods of time. Often, these collars are worn for comfort. However, certain collars may be worn to protect the neck and prevent further worsening of a serious cervical sprain.  HOME CARE INSTRUCTIONS   Put ice  on the injured area.   Put ice in a plastic bag.   Place a towel between your skin and the bag.   Leave the ice on for 15 to 20 minutes, 3 to 4 times a day.   Only take over-the-counter or prescription medicines for pain, discomfort, or fever as directed by your caregiver.   Keep all follow-up appointments as directed by your caregiver.   Keep all physical therapy appointments as directed by your  caregiver.   If a neck collar is prescribed, wear it as directed by your caregiver.   Do not drive while wearing a neck collar.   Make any needed adjustments to your work station to promote good posture.   Avoid positions and activities that make your symptoms worse.   Warm up and stretch before being active to help prevent problems.  SEEK MEDICAL CARE IF:   Your pain is not controlled with medicine.   You are unable to decrease your pain medicine over time as planned.   Your activity level is not improving as expected.  SEEK IMMEDIATE MEDICAL CARE IF:   You develop any bleeding, stomach upset, or signs of an allergic reaction to your medicine.   Your symptoms get worse.   You develop new, unexplained symptoms.   You have numbness, tingling, weakness, or paralysis in any part of your body.  MAKE SURE YOU:   Understand these instructions.   Will watch your condition.   Will get help right away if you are not doing well or get worse.  Document Released: 01/12/2007 Document Revised: 03/06/2011 Document Reviewed: 12/18/2010 [facility] Patient Information 2012 [place], [place].

## 2011-09-08 NOTE — Progress Notes (Signed)
  Subjective:    Patient ID: Jennifer Erickson, female    DOB: October 02, 1970, 41 y.o.   MRN: [id number]  HPI Patient presents with 4 days of neck pain. Denies any trauma. Has had a hx of neck pain but not like it has been the last 4 days. Reports 7/10 pain without radiation. She feels like she cannot move her neck at all. Denies headaches or numbness or tingling of either arm. She has tried heat on neck as well as changing the way she sleeps. Nothing makes better but has not tried any medication. She has not had fever, chills, muscle aches. She has had low to mid back pain. She has not had painful urination or abdominal discomfort.    Review of Systems     Objective:   Physical Exam  Constitutional: She is oriented to person, place, and time. She appears well-developed and well-nourished.       Morbidly obese.  HENT:  Head: Normocephalic and atraumatic.  Neck: Neck supple.       Neck ROM limited to side to side left to right. Less ROM when having to turn head to left. Up and down ROM is normal.   Cardiovascular: Normal rate, regular rhythm and normal heart sounds.   Pulmonary/Chest: Effort normal and breath sounds normal.       No CVA tenderness.  Musculoskeletal:       Arms:      paraspinus tenderness and tightness to the left of the base of cervical spine. No bony tenderness with palpation.  Lymphadenopathy:    She has no cervical adenopathy.  Neurological: She is alert and oriented to person, place, and time.  Skin: Skin is warm and dry.  Psychiatric: She has a normal mood and affect. Her behavior is normal.          Assessment & Plan:    Cervical neck pain- Will get x ray of cervical spine. Likely a sprain. Start taking ibuprofen 800mg  three times a day. Take Flexeril at bedtime. Use ice on neck. Start doing slow exercises with neck. Call if not improving in next week. May need to consider PT. Gave exercising to start doing at home. Follow up in 2 months.  No signs of  acute changes with xray. Reversal of normal curvature.  Back pain- UA negative for blood, leuks, and nitrates. Likely due to muscle tightness from neck strain. Treatment for neck strain should also help lower/mid back pain.

## 2011-09-24 ENCOUNTER — Other Ambulatory Visit: Payer: Self-pay | Admitting: Family Medicine

## 2011-09-30 ENCOUNTER — Ambulatory Visit (INDEPENDENT_AMBULATORY_CARE_PROVIDER_SITE_OTHER): Payer: Federal, State, Local not specified - PPO | Admitting: Family Medicine

## 2011-09-30 ENCOUNTER — Encounter: Payer: Self-pay | Admitting: Family Medicine

## 2011-09-30 VITALS — BP 126/76 | HR 84 | Ht 65.5 in | Wt 265.0 lb

## 2011-09-30 DIAGNOSIS — R7309 Other abnormal glucose: Secondary | ICD-10-CM

## 2011-09-30 DIAGNOSIS — E66813 Obesity, class 3: Secondary | ICD-10-CM

## 2011-09-30 NOTE — Patient Instructions (Signed)
Obesity Obesity is defined as having a body mass index (BMI) of 30 or more. To calculate your BMI divide your weight in pounds by your height in inches squared and multiply that product by 703. Major illnesses resulting from long-term obesity include:  Stroke.   Heart disease.   Diabetes.   Many cancers.   Arthritis.  Obesity also complicates recovery from many other medical problems.  CAUSES   A history of obesity in your parents.   Thyroid hormone imbalance.   Environmental factors such as excess calorie intake and physical inactivity.  TREATMENT  A healthy weight loss program includes:  A calorie restricted diet based on individual calorie needs.   Increased physical activity (exercise).  An exercise program is just as important as the right low-calorie diet.  Weight-loss medicines should be used only under the supervision of your physician. These medicines help, but only if they are used with diet and exercise programs. Medicines can have side effects including nervousness, nausea, abdominal pain, diarrhea, headache, drowsiness, and depression.  An unhealthy weight loss program includes:  Fasting.   Fad diets.   Supplements and drugs.  These choices do not succeed in long-term weight control.  HOME CARE INSTRUCTIONS  To help you make the needed dietary changes:   Exercise and perform physical activity as directed by your caregiver.   Keep a daily record of everything you eat. There are many free websites to help you with this. It may be helpful to measure your foods so you can determine if you are eating the correct portion sizes.   Use low-calorie cookbooks or take special cooking classes.   Avoid alcohol. Drink more water and drinks with no calories.   Take vitamins and supplements only as recommended by your caregiver.   Weight loss support groups, Registered Dieticians, counselors, and stress reduction education can also be very helpful.  Document Released:  04/24/2004 Document Revised: 03/06/2011 Document Reviewed: 02/21/2007 [employer] Patient Information 2012 [employer]. 

## 2011-09-30 NOTE — Progress Notes (Signed)
  Subjective:    Patient ID: Jennifer Erickson, female    DOB: [birth year], 41 y.o.   MRN: [id number]  HPI  Obesity discussed needs to start exercising she has had about a 6 pound weight loss since the end of May. While in the weight loss his weight loss she seems to be somewhat discouraged him the amount did talk and offered phentermine but she doesn't want to take any pills also spent time discussing the need to exercise. Unfortunately she has the concept of exercise being doing more labor at work but explained to her that this doesn't count since it doesn't increase her aerobic need to try her system to help boost her metabolism #2 elevated A1c 5.9. Patient informed of her borderline status.  Review of Systems  Constitutional: Negative.   All other systems reviewed and are negative.      BP 126/76  Pulse 84  Ht 5' 5.5" (1.664 m)  Wt 265 lb (120.203 kg)  BMI 43.43 kg/m2  SpO2 99% Objective:   Physical Exam  Vitals reviewed. Constitutional: She is oriented to person, place, and time. She appears well-nourished. No distress.       Obese black female  HENT:  Head: Normocephalic.  Neck: Normal range of motion. Neck supple.  Cardiovascular: Normal rate, regular rhythm and normal heart sounds.   Pulmonary/Chest: Effort normal and breath sounds normal.  Neurological: She is alert and oriented to person, place, and time.  Skin: Skin is warm and dry.  Psychiatric: Her speech is normal. Cognition and memory are normal. She exhibits a depressed mood.     Assessment & Plan:  #1 obesity return a month followup encouraged exercise. #2 elevated A1c. Would recheck in about 3-4 months. Addendum patient reports being very happy with current lab downstairs in obtaining both her and her husband lab work. Did offer patient the opportunity to have her lab work in the future at or near [employer] if she would like and if we can work things out.  Return one month for followup.

## 2011-10-16 ENCOUNTER — Emergency Department (INDEPENDENT_AMBULATORY_CARE_PROVIDER_SITE_OTHER): Payer: Federal, State, Local not specified - PPO

## 2011-10-16 ENCOUNTER — Emergency Department
Admission: EM | Admit: 2011-10-16 | Discharge: 2011-10-16 | Disposition: A | Discharge status: Home Health | Payer: Federal, State, Local not specified - PPO | Source: Home / Self Care

## 2011-10-16 DIAGNOSIS — S63509A Unspecified sprain of unspecified wrist, initial encounter: Secondary | ICD-10-CM

## 2011-10-16 DIAGNOSIS — M25539 Pain in unspecified wrist: Secondary | ICD-10-CM

## 2011-10-16 NOTE — ED Notes (Signed)
Pt c/o L wrist pain for past 2 days.  Pt states she struck wrist on unknown object 2 days ago.  TX with ice at home with relief.  Pt states she notices intermittent swelling and some ring finger pain as well.  Pt not taking any meds for pain.  Cap refil <2 secs distally.

## 2011-10-16 NOTE — ED Provider Notes (Signed)
History     CSN: [id number]  Arrival date & time 10/16/11  1654   First MD Initiated Contact with Patient 10/16/11 1655      Chief Complaint  Patient presents with  . Wrist Pain  HPI Comments: Pt struck ulnar side of her wrist while cleaning out her garage.  Pt states that she has had persistent ulnar sided wrist pain since this point.  No numbness or paresthesias.  Pain worse with wrist supination.   Patient is a 41 y.o. female presenting with wrist pain.  Wrist Pain This is a new problem. The current episode started more than 2 days ago. The problem occurs constantly. The problem has not changed since onset.   Past Medical History  Diagnosis Date  . Personal history of amenorrhea   . Sinusitis     Past Surgical History  Procedure Date  . Dilation and curettage of uterus   . Tonsillectomy     Family History  Problem Relation Age of Onset  . Cancer Mother   . Hypertension Father   . Diabetes Brother     History  Substance Use Topics  . Smoking status: Never Smoker   . Smokeless tobacco: Never Used  . Alcohol Use: No    OB History    Grav Para Term Preterm Abortions TAB SAB Ect Mult Living                  Review of Systems  All other systems reviewed and are negative.    Allergies  Aspirin  Home Medications   Current Outpatient Rx  Name Route Sig Dispense Refill  . FLUTICASONE PROPIONATE 50 MCG/ACT NA SUSP Nasal Place 2 sprays into the nose daily. 16 g 1  . NORETHINDRON-ETHINYL ESTRAD-FE 1-20/1-30/1-35 MG-MCG PO TABS Oral Take 1 tablet by mouth daily.    [name] PANTOPRAZOLE SODIUM 40 MG PO TBEC Oral Take 1 tablet (40 mg total) by mouth daily. 30 tablet 1  . SUMATRIPTAN SUCCINATE 100 MG PO TABS Oral Take 1 tablet (100 mg total) by mouth every 2 (two) hours as needed for migraine (no more than 2 in 24 hrs). 10 tablet 0    BP 147/91  Pulse 87  Temp 98.3 F (36.8 C) (Oral)  Resp 14  Ht 5\' 6"  (1.676 m)  Wt 270 lb (122.471 kg)  BMI 43.58 kg/m2  SpO2  99%  Physical Exam  Constitutional: She appears well-developed and well-nourished.  HENT:  Head: Normocephalic and atraumatic.  Mouth/Throat: Oropharynx is clear and moist.  Eyes: Conjunctivae are normal. Pupils are equal, round, and reactive to light.  Neck: Normal range of motion. Neck supple.  Cardiovascular: Normal rate and regular rhythm.   Pulmonary/Chest: Effort normal and breath sounds normal.  Abdominal: Soft.  Musculoskeletal:       Arms: Neurological: She is alert.  Skin: Skin is warm.    ED Course  Procedures (including critical care time)  Labs Reviewed - No data to display Dg Wrist Complete Left  10/16/2011  *RADIOLOGY REPORT*  Clinical Data: Left wrist pain.  LEFT WRIST - COMPLETE 3+ VIEW  Comparison: None.  Findings: The left wrist is located.  No acute bone or soft tissue abnormality is evident.  IMPRESSION: Negative left wrist.  Original Report Authenticated By: Jamesetta Orleans. MATTERN, M.D.     No diagnosis found.    MDM  Likely TFCC sprain.  Will splint.  RICE, NSAIDs.  Sports medicine follow up in 1-2 weeks.  Handout given.  The patient and/or caregiver has been counseled thoroughly with regard to treatment plan and/or medications prescribed including dosage, schedule, interactions, rationale for use, and possible side effects and they verbalize understanding. Diagnoses and expected course of recovery discussed and will return if not improved as expected or if the condition worsens. Patient and/or caregiver verbalized understanding.              Floydene Flock, MD 10/16/11 [phone]

## 2011-10-18 NOTE — ED Provider Notes (Signed)
Agree with exam, assessment, and plan.   Lattie Haw, MD 10/18/11 [phone]

## 2011-10-24 ENCOUNTER — Encounter: Payer: Self-pay | Admitting: Physician Assistant

## 2011-10-24 ENCOUNTER — Ambulatory Visit (INDEPENDENT_AMBULATORY_CARE_PROVIDER_SITE_OTHER): Payer: Federal, State, Local not specified - PPO | Admitting: Physician Assistant

## 2011-10-24 VITALS — BP 112/75 | HR 70 | Ht 66.0 in | Wt 263.0 lb

## 2011-10-24 DIAGNOSIS — R030 Elevated blood-pressure reading, without diagnosis of hypertension: Secondary | ICD-10-CM

## 2011-10-24 DIAGNOSIS — IMO0001 Reserved for inherently not codable concepts without codable children: Secondary | ICD-10-CM

## 2011-10-24 NOTE — Progress Notes (Signed)
  Subjective:    Patient ID: Jennifer Erickson, female    DOB: December 05, 1970, 41 y.o.   MRN: [id number]  HPI Pt presents to the clinic because she has felt dizzy and had episodes of high blood pressure for the last week and a half. She felt really bad yesterday and took blood pressure and was 140/100 and 138/98. She denies any chest pains or weird chest tightness. She denies any SOB, fever, chills, vision blurredness. She didn't take anything for it but went to sleep and felt better when she woke up. She has been working more at work lately and been hurting all over and not drinking as much fluid. 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 No GI symptoms. Work has also been making her feel more anxious.    Review of Systems     Objective:   Physical Exam  Constitutional: She is oriented to person, place, and time. She appears well-developed and well-nourished.  HENT:  Head: Normocephalic and atraumatic.  Right Ear: External ear normal.  Left Ear: External ear normal.  Nose: Nose normal.  Mouth/Throat: Oropharynx is clear and moist. No oropharyngeal exudate.       TM's are normal.  Eyes: EOM are normal. Pupils are equal, round, and reactive to light.  Neck: Normal range of motion. Neck supple.  Cardiovascular: Normal rate, regular rhythm, normal heart sounds and intact distal pulses.   Pulmonary/Chest: Effort normal and breath sounds normal. She has no wheezes.  Lymphadenopathy:    She has no cervical adenopathy.  Neurological: She is alert and oriented to person, place, and time.       Negative Dix hallpike  Skin: Skin is warm and dry.  Psychiatric: She has a normal mood and affect. Her behavior is normal.          Assessment & Plan:  Elevated Blood pressure- I suspect that elevation might be due to pain or temporary anxiety. STay hydrated water hourly. Continue to monitor blood pressure. If blood pressure get above 140/90 on a regular basis then we need to talk  about medication. Consider low salt. Limit processed foods and fast food. Continue exercise. Tylenol or Motrin for headaches.

## 2011-10-24 NOTE — Patient Instructions (Addendum)
STay hydrated water hourly. Continue to monitor blood pressure. If blood pressure get above 140/90 on a regular basis then we need to talk about medication. Consider low salt. Limit processed foods and fast food. Continue exercise. Tylenol or Motrin for headaches.

## 2011-11-04 ENCOUNTER — Ambulatory Visit: Payer: Federal, State, Local not specified - PPO | Admitting: Family Medicine

## 2011-11-25 ENCOUNTER — Ambulatory Visit (INDEPENDENT_AMBULATORY_CARE_PROVIDER_SITE_OTHER): Payer: Federal, State, Local not specified - PPO | Admitting: Family Medicine

## 2011-11-25 ENCOUNTER — Encounter: Payer: Self-pay | Admitting: Family Medicine

## 2011-11-25 VITALS — BP 109/59 | HR 70 | Ht 66.0 in | Wt 262.0 lb

## 2011-11-25 DIAGNOSIS — F4541 Pain disorder exclusively related to psychological factors: Secondary | ICD-10-CM

## 2011-11-25 DIAGNOSIS — E66813 Obesity, class 3: Secondary | ICD-10-CM

## 2011-11-25 DIAGNOSIS — IMO0001 Reserved for inherently not codable concepts without codable children: Secondary | ICD-10-CM

## 2011-11-25 DIAGNOSIS — R030 Elevated blood-pressure reading, without diagnosis of hypertension: Secondary | ICD-10-CM

## 2011-11-25 DIAGNOSIS — G44209 Tension-type headache, unspecified, not intractable: Secondary | ICD-10-CM

## 2011-11-25 NOTE — Patient Instructions (Signed)

## 2011-11-25 NOTE — Progress Notes (Signed)
Subjective:    Patient ID: Jennifer Erickson, female    DOB: March 13, 1971, 41 y.o.   MRN: [id number]  HPI #1 headaches. Patient is concerned about her headaches. She has used a few of her Fioricet tablets for headaches. She is concerned about driving with the Fioricet tablets in her system due to sedation. Still feel that this may be migraine headaches and we talked about treatment for migraines with Tryptans.  #2 obesity. Patient's husband has had results losing a significant amount of weight by restricting his daily calories and exercise. While this has been great for him she has been frustrated and taking supplements from a local supplements store that she thinks may not be agreeing with her and aggravating her headaches. She states her husband refuses to take those supplements. Once again I've explained to her the lack of regulation by the FDA on supplements and that some weight loss supplements have stimulants to try to improve their efficacy.  #3 elevated blood pressure she reports that her blood pressure at work was elevated. Especially one time when her her hand was hurting and she is also noted after taking her dietary supplement at work and then having her blood pressure checked.  #4 she needs a new date on her FMLA for her husband because her husband does not drive and she works day shift. I bring him to the doctor's office she's unable to get her normal daytime sleep and then has trouble functioning at work.  Review of Systems  Neurological: Positive for headaches.  All other systems reviewed and are negative.      BP 109/59  Pulse 70  Ht 5\' 6"  (1.676 m)  Wt 262 lb (118.842 kg)  BMI 42.29 kg/m2  SpO2 99% Objective:   Physical Exam  Vitals reviewed. Constitutional: She is oriented to person, place, and time. She appears well-developed and well-nourished. She is active. She appears distressed.       Obese black female  HENT:  Head: Normocephalic and atraumatic.  Neck:  Neck supple.  Cardiovascular: Normal rate, regular rhythm and normal heart sounds.   Pulmonary/Chest: Effort normal and breath sounds normal.  Neurological: She is alert and oriented to person, place, and time.  Skin: Skin is warm and dry.  Psychiatric: She has a normal mood and affect. Her behavior is normal.    CT report of her head done less than two years ago was reviewed.    Assessment & Plan:  #1 tension headaches/possible migraine headaches. Explained to her why I want to try her on a tryptan to diagnose whether she had migraines or not. Unfortunately we are out of injectable and other samples Triptans in our sample closets. Because of that I will give her a prescription of Imitrex 100 mg to take 1 and repeat in 2 hours if needed maximum 2 in 24 hours to see if these headaches are migraine headaches. Reassure her that this low numbers of her sats given should not cause addiction and that if she is able to function and has not suffered from sedation with the Fioricet then she could actually probably drive with the Fioricet.  #2 obesity. The current obesity trial with supplements that may or may not have been contaminated has not worked well for her weight loss. We'll try to get more information such as the Nutrilite products to her.  #3 elevated blood pressure. Reviewed blood pressure but she's had done at the office and other than 1 blood pressure where her systolic  went into the 140s 150 range she has not show no signs of hypertension at this time.  #4 situational problems. Patient needs a new FLMA form signed to allow her to bring her husband to the doctor's office since he does not drive and he has had exacerbation of some of his illnesses.  We will have her return with her husband at his next visit.

## 2011-12-25 ENCOUNTER — Other Ambulatory Visit: Payer: Self-pay | Admitting: *Deleted

## 2011-12-25 MED ORDER — SUMATRIPTAN SUCCINATE 100 MG PO TABS: 100.0000 mg | ORAL_TABLET | ORAL | Status: DC | PRN

## 2011-12-30 ENCOUNTER — Encounter: Payer: Self-pay | Admitting: Family Medicine

## 2011-12-30 ENCOUNTER — Ambulatory Visit (INDEPENDENT_AMBULATORY_CARE_PROVIDER_SITE_OTHER): Payer: Federal, State, Local not specified - PPO | Admitting: Family Medicine

## 2011-12-30 VITALS — BP 135/90 | HR 94 | Wt 262.0 lb

## 2011-12-30 DIAGNOSIS — K1379 Other lesions of oral mucosa: Secondary | ICD-10-CM | POA: Insufficient documentation

## 2011-12-30 DIAGNOSIS — R51 Headache: Secondary | ICD-10-CM

## 2011-12-30 DIAGNOSIS — Z23 Encounter for immunization: Secondary | ICD-10-CM

## 2011-12-30 DIAGNOSIS — K2100 Gastro-esophageal reflux disease with esophagitis, without bleeding: Secondary | ICD-10-CM

## 2011-12-30 DIAGNOSIS — K1370 Unspecified lesions of oral mucosa: Secondary | ICD-10-CM

## 2011-12-30 DIAGNOSIS — G4700 Insomnia, unspecified: Secondary | ICD-10-CM

## 2011-12-30 MED ORDER — RANITIDINE HCL 300 MG PO TABS: 300.0000 mg | ORAL_TABLET | Freq: Every day | ORAL | Status: DC

## 2011-12-30 MED ORDER — LORATADINE-PSEUDOEPHEDRINE ER 10-240 MG PO TB24: 1.0000 | ORAL_TABLET | Freq: Every day | ORAL | Status: DC

## 2011-12-30 MED ORDER — AMOXICILLIN-POT CLAVULANATE 875-125 MG PO TABS: 1.0000 | ORAL_TABLET | Freq: Two times a day (BID) | ORAL | Status: DC

## 2011-12-30 MED ORDER — EPINEPHRINE 0.3 MG/0.3ML IJ DEVI: 0.3000 mg | Freq: Once | INTRAMUSCULAR | Status: DC

## 2011-12-30 NOTE — Patient Instructions (Signed)
Epinephrine Injection Epinephrine is a medicine given by injection to temporarily treat an emergency allergic reaction. It is also used to treat severe asthmatic attacks and other lung problems. The medicine helps to enlarge (dilate) the small breathing tubes of the lungs. A life-threatening, sudden allergic reaction that involves the whole body is called anaphylaxis. Because of potential side effects, epinephrine should only be used as directed by your caregiver. RISKS AND COMPLICATIONS Possible side effects of epinephrine injections include:  Chest pain.  Irregular or rapid heartbeat.  Shortness of breath.  Nausea.  Vomiting.  Abdominal pain or cramping.  Sweating.  Dizziness.  Weakness.  Headache.  Nervousness. Report all side effects to your caregiver. HOW TO GIVE AN EPINEPHRINE INJECTION Give the epinephrine injection immediately when symptoms of a severe reaction begin. Inject the medicine into the outer thigh or any available, large muscle. Your caregiver can teach you how to do this. You do not need to remove any clothing. After the injection, call your local emergency services (911 in [place] Even if you improve after the injection, you need to be examined at a hospital emergency department. Epinephrine works quickly, but it also wears off quickly. Delayed reactions can occur. A delayed reaction may be as serious and dangerous as the initial reaction. HOME CARE INSTRUCTIONS  Make sure you and your family know how to give an epinephrine injection.  Use epinephrine injections as directed by your caregiver. Do not use this medicine more often or in larger doses than prescribed.  Always carry your epinephrine injection or anaphylaxis kit with you. This can be lifesaving if you have a severe reaction.  Store the medicine in a cool, dry place. If the medicine becomes discolored or cloudy, dispose of it properly and replace it with new medicine.  Check the expiration date on  your medicine. It may be unsafe to use medicines past their expiration date.  Tell your caregiver about any other medicines you are taking. Some medicines can react badly with epinephrine.  Tell your caregiver about any medical conditions you have, such as diabetes, high blood pressure (hypertension), heart disease, irregular heartbeats, or if you are pregnant. SEEK IMMEDIATE MEDICAL CARE IF:  You have used an epinephrine injection. Call your local emergency services (911 in [place] Even if you improve after the injection, you need to be examined at a hospital emergency department to make sure your allergic reaction is under control. You will also be monitored for adverse effects from the medicine.  You have chest pain.  You have irregular or fast heartbeats.  You have shortness of breath.  You have severe headaches.  You have severe nausea, vomiting, or abdominal cramps.  You have severe pain, swelling, or redness in the area where you gave the injection. Document Released: 03/14/2000 Document Revised: 06/09/2011 Document Reviewed: 12/04/2010 [facility] Patient Information 2013 [place], [place]. Anaphylactic Reaction An anaphylactic reaction is a sudden, severe allergic reaction that involves the whole body. It can be life threatening. A hospital stay is often required. People with asthma, eczema, or hay fever are slightly more likely to have an anaphylactic reaction. CAUSES  An anaphylactic reaction may be caused by anything to which you are allergic. After being exposed to the allergic substance, your immune system becomes sensitized to it. When you are exposed to that allergic substance again, an allergic reaction can occur. Common causes of an anaphylactic reaction include:  Medicines.  Foods, especially peanuts, wheat, shellfish, milk, and eggs.  Insect bites or stings.  Blood  products.  Chemicals, such as dyes, latex, and contrast material used for imaging tests. SYMPTOMS    When an allergic reaction occurs, the body releases histamine and other substances. These substances cause symptoms such as tightening of the airway. Symptoms often develop within seconds or minutes of exposure. Symptoms may include:  Skin rash or hives.  Itching.  Chest tightness.  Swelling of the eyes, tongue, or lips.  Trouble breathing or swallowing.  Lightheadedness or fainting.  Anxiety or confusion.  Stomach pains, vomiting, or diarrhea.  Nasal congestion.  A fast or irregular heartbeat (palpitations). DIAGNOSIS  Diagnosis is based on your history of recent exposure to allergic substances, your symptoms, and a physical exam. Your caregiver may also perform blood or urine tests to confirm the diagnosis. TREATMENT  Epinephrine medicine is the main treatment for an anaphylactic reaction. Other medicines that may be used for treatment include antihistamines, steroids, and albuterol. In severe cases, fluids and medicine to support blood pressure may be given through an intravenous line (IV). Even if you improve after treatment, you need to be observed to make sure your condition does not get worse. This may require a stay in the hospital. HOME CARE INSTRUCTIONS   Wear a medical alert bracelet or necklace stating your allergy.  You and your family must learn how to use an anaphylaxis kit or give an epinephrine injection to temporarily treat an emergency allergic reaction. Always carry your epinephrine injection or anaphylaxis kit with you. This can be lifesaving if you have a severe reaction.  Do not drive or perform tasks after treatment until the medicines used to treat your reaction have worn off, or until your caregiver says it is okay.  If you have hives or a rash:  Take medicines as directed by your caregiver.  You may use an over-the-counter antihistamine (diphenhydramine) as needed.  Apply cold compresses to the skin or take baths in cool water. Avoid hot baths or  showers. SEEK MEDICAL CARE IF:   You develop symptoms of an allergic reaction to a new substance. Symptoms may start right away or minutes later.  You develop a rash, hives, or itching.  You develop new symptoms. SEEK IMMEDIATE MEDICAL CARE IF:   You have swelling of the mouth, difficulty breathing, or wheezing.  You have a tight feeling in your chest or throat.  You develop hives, swelling, or itching all over your body.  You develop severe vomiting or diarrhea.  You feel faint or pass out. This is an emergency. Use your epinephrine injection or anaphylaxis kit as you have been instructed. Call your local emergency services (911 in [place] Even if you improve after the injection, you need to be examined at a hospital emergency department. MAKE SURE YOU:   Understand these instructions.  Will watch your condition.  Will get help right away if you are not doing well or get worse. Document Released: 03/17/2005 Document Revised: 09/16/2011 Document Reviewed: 06/18/2011 [facility] Patient Information 2013 [place], [place].

## 2011-12-30 NOTE — Progress Notes (Signed)
Subjective:    Patient ID: Jennifer Erickson, female    DOB: 03-Jun-1970, 41 y.o.   MRN: [id number]  Allergic Reaction This is a recurrent problem. The current episode started 5 to 7 days ago (Patient took an epipen Sunday due to ulvula swelling). The problem occurs intermittently. The problem has been waxing and waning since onset. The problem is severe. The time of exposure is unknown. The exposure occurred at home. Associated symptoms include coughing, difficulty breathing and trouble swallowing. Pertinent negatives include no abdominal pain, chest pain, chest pressure, diarrhea, drooling, eye itching, eye redness, globus sensation, hyperventilation, itching, stridor, vomiting or wheezing. Swelling is present on the tongue. Past treatments include epinephrine. The treatment provided moderate relief. There is no history of asthma, atopic dermatitis, food allergies, medication allergies or seasonal allergies.   Patient on Sunday has some trouble swallowing and consider uvula swollen she administered injection or at to her thigh. Her UA did shrink she did breathe better however she had a lot of burning in her thigh that was persistent after the injection.  Should be noted that patient has had a history of reflux esophagitis.  #2 headaches. Patient still has not tried her Imitrex. She is afraid it will make her sleepy and she states that when she takes her Fioricets it knocks her out. Explained once again that the Fioricet will cause sedation while the Imitrex would not and that she needs to get this a try.  #3 offered flu vaccination. Patient declines.     Review of Systems  HENT: Positive for trouble swallowing. Negative for drooling.   Eyes: Negative for redness and itching.  Respiratory: Positive for cough. Negative for wheezing and stridor.   Cardiovascular: Negative for chest pain.  Gastrointestinal: Negative for vomiting, abdominal pain and diarrhea.       Patient has had history of  acid reflux and does have some recurrence of the acid reflux as well  Skin: Negative for itching.      BP 135/90  Pulse 94  Wt 262 lb (118.842 kg) Objective:   Physical Exam  Constitutional: She is oriented to person, place, and time. She appears well-developed and well-nourished.  HENT:  Head: Normocephalic.  Neck: Neck supple.  Cardiovascular: Normal rate and regular rhythm.   Pulmonary/Chest: Effort normal and breath sounds normal.  Neurological: She is alert and oriented to person, place, and time.  Skin: Skin is warm and dry.  Psychiatric: She has a normal mood and affect.       Assessment & Plan:  #1 uvula swelling/allergic reaction. The question is whether this is coming from uvula irritation from reflux or sinus drainage. Even today with uvula back to normal size she reports of fullness of her mouth and the need to spit. When the one considers that she is ready had her tonsils and adenoids removed I have to wonder if this could be coming from the GI tract versus her nasal passages.  We will do a strep test place her on a short course of antibiotics but the main 3 things I want her to take on a daily basis until she returns is Zantac 300 mg at night every night, Flonase nasal spray 2 puffs each nostril daily, and either Allegra D., Claritin D. or Zyrtec D. on a daily basis until I see her back in 4-6 weeks if this stops the swelling and the symptoms fine but if not may need ENT evaluation. She did not particularly connect with her previous ENT  so she might see a different ENT.  Also we're going to give her another EpiPen and I want to make sure that she has access to 2 pins at all times and if she has to use one EpiPen she needs to be able to have access and second EpiPen and if she doesn't then she may need to strongly consider getting to the emergency room.  #2 headaches. Due to her recurrent headaches will try the Imitrex and reevaluate when she returns in 6 weeks #3 should be  noted the patient declined flu vaccination today despite my recommendations.

## 2012-01-08 ENCOUNTER — Encounter: Payer: Self-pay | Admitting: Family Medicine

## 2012-01-08 ENCOUNTER — Ambulatory Visit (INDEPENDENT_AMBULATORY_CARE_PROVIDER_SITE_OTHER): Payer: Federal, State, Local not specified - PPO | Admitting: Family Medicine

## 2012-01-08 ENCOUNTER — Ambulatory Visit (INDEPENDENT_AMBULATORY_CARE_PROVIDER_SITE_OTHER): Payer: Federal, State, Local not specified - PPO

## 2012-01-08 VITALS — BP 131/86 | HR 99 | Temp 99.6°F | Ht 66.0 in | Wt 261.0 lb

## 2012-01-08 DIAGNOSIS — R079 Chest pain, unspecified: Secondary | ICD-10-CM

## 2012-01-08 DIAGNOSIS — R0781 Pleurodynia: Secondary | ICD-10-CM

## 2012-01-08 DIAGNOSIS — G479 Sleep disorder, unspecified: Secondary | ICD-10-CM

## 2012-01-08 DIAGNOSIS — R51 Headache: Secondary | ICD-10-CM

## 2012-01-08 DIAGNOSIS — R0602 Shortness of breath: Secondary | ICD-10-CM

## 2012-01-08 MED ORDER — KETOROLAC TROMETHAMINE 60 MG/2ML IM SOLN
60.0000 mg | Freq: Once | INTRAMUSCULAR | Status: AC
  Administered 2012-01-08: 60 mg via INTRAMUSCULAR

## 2012-01-08 NOTE — Patient Instructions (Addendum)
Sleep Apnea  Sleep apnea is a sleep disorder characterized by abnormal pauses in breathing while you sleep. When your breathing pauses, the level of oxygen in your blood decreases. This causes you to move out of deep sleep and into light sleep. As a result, your quality of sleep is poor, and the system that carries your blood throughout your body (cardiovascular system) experiences stress. If sleep apnea remains untreated, the following conditions can develop:  High blood pressure (hypertension).  Coronary artery disease.  Inability to achieve or maintain an erection (impotence).  Impairment of your thought process (cognitive dysfunction). There are three types of sleep apnea: 1. Obstructive sleep apnea Pauses in breathing during sleep because of a blocked airway. 2. Central sleep apnea Pauses in breathing during sleep because the area of the brain that controls your breathing does not send the correct signals to the muscles that control breathing. 3. Mixed sleep apnea A combination of both obstructive and central sleep apnea. RISK FACTORS The following risk factors can increase your risk of developing sleep apnea:  Being overweight.  Smoking.  Having narrow passages in your nose and throat.  Being of older age.  Being female.  Alcohol use.  Sedative and tranquilizer use.  Ethnicity. Among individuals younger than 35 years, African Americans are at increased risk of sleep apnea. SYMPTOMS   Difficulty staying asleep.  Daytime sleepiness and fatigue.  Loss of energy.  Irritability.  Loud, heavy snoring.  Morning headaches.  Trouble concentrating.  Forgetfulness.  Decreased interest in sex. DIAGNOSIS  In order to diagnose sleep apnea, your caregiver will perform a physical examination. Your caregiver may suggest that you take a home sleep test. Your caregiver may also recommend that you spend the night in a sleep lab. In the sleep lab, several monitors record  information about your heart, lungs, and brain while you sleep. Your leg and arm movements and blood oxygen level are also recorded. TREATMENT The following actions may help to resolve mild sleep apnea:  Sleeping on your side.   Using a decongestant if you have nasal congestion.   Avoiding the use of depressants, including alcohol, sedatives, and narcotics.   Losing weight and modifying your diet if you are overweight. There also are devices and treatments to help open your airway:  Oral appliances. These are custom-made mouthpieces that shift your lower jaw forward and slightly open your bite. This opens your airway.  Devices that create positive airway pressure. This positive pressure "splints" your airway open to help you breathe better during sleep. The following devices create positive airway pressure:  Continuous positive airway pressure (CPAP) device. The CPAP device creates a continuous level of air pressure with an air pump. The air is delivered to your airway through a mask while you sleep. This continuous pressure keeps your airway open.  Nasal expiratory positive airway pressure (EPAP) device. The EPAP device creates positive air pressure as you exhale. The device consists of single-use valves, which are inserted into each nostril and held in place by adhesive. The valves create very little resistance when you inhale but create much more resistance when you exhale. That increased resistance creates the positive airway pressure. This positive pressure while you exhale keeps your airway open, making it easier to breath when you inhale again.  Bilevel positive airway pressure (BPAP) device. The BPAP device is used mainly in patients with central sleep apnea. This device is similar to the CPAP device because it also uses an air pump to deliver  continuous air pressure through a mask. However, with the BPAP machine, the pressure is set at two different levels. The pressure when you  exhale is lower than the pressure when you inhale.  Surgery. Typically, surgery is only done if you cannot comply with less invasive treatments or if the less invasive treatments do not improve your condition. Surgery involves removing excess tissue in your airway to create a wider passage way. Document Released: 03/07/2002 Document Revised: 09/16/2011 Document Reviewed: 07/24/2011 [facility] Patient Information 2013 [place], [place]. Polysomnography (Sleep Studies) Polysomnography (PSG) is a series of tests used for detecting (diagnosing) obstructive sleep apnea and other sleep disorders. The tests measure how some parts of your body are working while you are sleeping. The tests are extensive and expensive. They are done in a sleep lab or hospital, and vary from center to center. Your caregiver may perform other more simple sleep studies and questionnaires before doing more complete and involved testing. Testing may not be covered by insurance. Some of these tests are:  An EEG (Electroencephalogram). This tests your brain waves and stages of sleep.  An EOG (Electrooculogram). This measures the movements of your eyes. It detects periods of REM (rapid eye movement) sleep, which is your dream sleep.  An EKG (Electrocardiogram). This measures your heart rhythm.  EMG (Electromyography). This is a measurement of how the muscles are working in your upper airway and your legs while sleeping.  An oximetry measurement. It measures how much oxygen (air) you are getting while sleeping.  Breathing efforts may be measured. The same test can be interpreted (understood) differently by different caregivers and centers that study sleep.  Studies may be given an apnea/hypopnea index (AHI). This is a number which is found by counting the times of no breathing or under breathing during the night, and relating those numbers to the amount of time spent in bed. When the AHI is greater than 15, the patient is likely to  complain of daytime sleepiness. When the AHI is greater than 30, the patient is at increased risk for heart problems and must be followed more closely. Following the AHI also allows you to know how treatment is working. Simple oximetry (tracking the amount of oxygen that is taken in) can be used for screening patients who:  Do not have symptoms (problems) of OSA.  Have a normal Epworth Sleepiness Scale Score.  Have a low pre-test probability of having OSA.  Have none of the upper airway problems likely to cause apnea.  Oximetry is also used to determine if treatment is effective in patients who showed significant desaturations (not getting enough oxygen) on their home sleep study. One extra measure of safety is to perform additional studies for the person who only snores. This is because no one can predict with absolute certainty who will have OSA. Those who show significant desaturations (not getting enough oxygen) are recommended to have a more detailed sleep study. Document Released: 09/21/2002 Document Revised: 06/09/2011 Document Reviewed: 03/17/2005 [facility] Patient Information 2013 [place], [place]. Chest Pain (Nonspecific) It is often hard to give a specific diagnosis for the cause of chest pain. There is always a chance that your pain could be related to something serious, such as a heart attack or a blood clot in the lungs. You need to follow up with your caregiver for further evaluation. CAUSES   Heartburn.  Pneumonia or bronchitis.  Anxiety or stress.  Inflammation around your heart (pericarditis) or lung (pleuritis or pleurisy).  A blood clot in  the lung.  A collapsed lung (pneumothorax). It can develop suddenly on its own (spontaneous pneumothorax) or from injury (trauma) to the chest.  Shingles infection (herpes zoster virus). The chest wall is composed of bones, muscles, and cartilage. Any of these can be the source of the pain.  The bones can be bruised by  injury.  The muscles or cartilage can be strained by coughing or overwork.  The cartilage can be affected by inflammation and become sore (costochondritis). DIAGNOSIS  Lab tests or other studies, such as X-rays, electrocardiography, stress testing, or cardiac imaging, may be needed to find the cause of your pain.  TREATMENT   Treatment depends on what may be causing your chest pain. Treatment may include:  Acid blockers for heartburn.  Anti-inflammatory medicine.  Pain medicine for inflammatory conditions.  Antibiotics if an infection is present.  You may be advised to change lifestyle habits. This includes stopping smoking and avoiding alcohol, caffeine, and chocolate.  You may be advised to keep your head raised (elevated) when sleeping. This reduces the chance of acid going backward from your stomach into your esophagus.  Most of the time, nonspecific chest pain will improve within 2 to 3 days with rest and mild pain medicine. HOME CARE INSTRUCTIONS   If antibiotics were prescribed, take your antibiotics as directed. Finish them even if you start to feel better.  For the next few days, avoid physical activities that bring on chest pain. Continue physical activities as directed.  Do not smoke.  Avoid drinking alcohol.  Only take over-the-counter or prescription medicine for pain, discomfort, or fever as directed by your caregiver.  Follow your caregiver's suggestions for further testing if your chest pain does not go away.  Keep any follow-up appointments you made. If you do not go to an appointment, you could develop lasting (chronic) problems with pain. If there is any problem keeping an appointment, you must call to reschedule. SEEK MEDICAL CARE IF:   You think you are having problems from the medicine you are taking. Read your medicine instructions carefully.  Your chest pain does not go away, even after treatment.  You develop a rash with blisters on your  chest. SEEK IMMEDIATE MEDICAL CARE IF:   You have increased chest pain or pain that spreads to your arm, neck, jaw, back, or abdomen.  You develop shortness of breath, an increasing cough, or you are coughing up blood.  You have severe back or abdominal pain, feel nauseous, or vomit.  You develop severe weakness, fainting, or chills.  You have a fever. THIS IS AN EMERGENCY. Do not wait to see if the pain will go away. Get medical help at once. Call your local emergency services (911 in [place] Do not drive yourself to the hospital. MAKE SURE YOU:   Understand these instructions.  Will watch your condition.  Will get help right away if you are not doing well or get worse. Document Released: 12/25/2004 Document Revised: 06/09/2011 Document Reviewed: 10/21/2007 [facility] Patient Information 2013 [place], [place].

## 2012-01-08 NOTE — Progress Notes (Signed)
  Subjective:    Patient ID: Jennifer Erickson, female    DOB: 26-Jun-1970, 41 y.o.   MRN: [id number]  HPI #1 chest pain . She reports for the last 2 days having chest pain. 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 Hurts when she takes deep breath. It hurts when she is at rest or is working. Interferes with her sleeping at night because it is constant. No previous history of heart attack or MI before.  #2 headaches she reports having a significant headache at this time that his also been ongoing for several days.  #3 sleep disturbance she's been unable to sleep and finally agrees to have sleep study done to make sure she does not have sleep apnea. She does state that her husband reports that she does snore and at times seems to stop breathing.  Review of Systems  Respiratory: Positive for chest tightness and shortness of breath.   Musculoskeletal: Positive for arthralgias.  Neurological: Positive for dizziness.  Psychiatric/Behavioral: Positive for disturbed wake/sleep cycle.      BP 106/64  Pulse 94  Temp 99.6 F (37.6 C) (Oral)  Ht 5\' 6"  (1.676 m)  Wt 261 lb (118.389 kg)  BMI 42.13 kg/m2  SpO2 99% Objective:   Physical Exam  Vitals reviewed. Constitutional: She is oriented to person, place, and time. She appears well-developed and well-nourished.       Obese black female.  HENT:  Head: Normocephalic and atraumatic.  Eyes: Pupils are equal, round, and reactive to light.  Neck: Normal range of motion. Neck supple. No tracheal deviation present.  Cardiovascular: Normal rate, regular rhythm and normal heart sounds.        Marked tenderness over the anterior chest wall including the sternum and also tenderness along the right ribs as well. The pain along the ribs goes from the anterior ribs all the way to the back  Pulmonary/Chest: Effort normal and breath sounds normal.  Musculoskeletal: Normal range of motion.    Neurological: She is alert and oriented to person, place, and time.  Skin: Skin is warm and dry. No rash noted. No erythema.  Psychiatric: She has a normal mood and affect. Her behavior is normal.      EKG showed sinus rhythm no specific abnormalities noted. Was compared with previous EKG and found to be essentially unchanged.  Assessment & Plan:  #1 chest pain/costochondritis and rib pain. Patient sent for x-rays and lab work after the Toradol injection was given and she returned she does states that the headache and chest pain were better. #2 headaches improvement of the headaches will encourage her to continue to think about using     triptans for her headaches. #3 sleep disturbance. Will refer for sleep study. I am aware of her concern that if we try to do that test at night she might not be able to sleep.  Return in 2 weeks for followup.

## 2012-01-09 LAB — COMPREHENSIVE METABOLIC PANEL
ALT: 22 U/L (ref 0–35)
Albumin: 3.9 g/dL (ref 3.5–5.2)
CO2: 26 mEq/L (ref 19–32)
Calcium: 8.9 mg/dL (ref 8.4–10.5)
Chloride: 104 mEq/L (ref 96–112)
Glucose, Bld: 89 mg/dL (ref 70–99)
Potassium: 4.1 mEq/L (ref 3.5–5.3)
Sodium: 140 mEq/L (ref 135–145)
Total Protein: 7.2 g/dL (ref 6.0–8.3)

## 2012-01-09 LAB — CBC
MCV: 87.7 fL (ref 78.0–100.0)
Platelets: 289 10*3/uL (ref 150–400)
RBC: 4.54 MIL/uL (ref 3.87–5.11)
RDW: 14 % (ref 11.5–15.5)
WBC: 16.6 10*3/uL — ABNORMAL HIGH (ref 4.0–10.5)

## 2012-01-09 LAB — TROPONIN I: Troponin I: <0.01 ng/mL (ref <0.06)

## 2012-01-09 LAB — CK TOTAL AND CKMB (NOT AT ARMC): Relative Index: 0.8 (ref 0.0–2.5)

## 2012-01-12 ENCOUNTER — Encounter: Payer: Self-pay | Admitting: *Deleted

## 2012-01-12 ENCOUNTER — Ambulatory Visit (INDEPENDENT_AMBULATORY_CARE_PROVIDER_SITE_OTHER): Payer: Federal, State, Local not specified - PPO | Admitting: Physician Assistant

## 2012-01-12 ENCOUNTER — Encounter: Payer: Self-pay | Admitting: Physician Assistant

## 2012-01-12 VITALS — BP 104/59 | HR 97 | Temp 98.4°F | Ht 66.0 in | Wt 259.0 lb

## 2012-01-12 DIAGNOSIS — R0789 Other chest pain: Secondary | ICD-10-CM

## 2012-01-12 DIAGNOSIS — R109 Unspecified abdominal pain: Secondary | ICD-10-CM

## 2012-01-12 DIAGNOSIS — R748 Abnormal levels of other serum enzymes: Secondary | ICD-10-CM

## 2012-01-12 DIAGNOSIS — R112 Nausea with vomiting, unspecified: Secondary | ICD-10-CM

## 2012-01-12 DIAGNOSIS — R7989 Other specified abnormal findings of blood chemistry: Secondary | ICD-10-CM

## 2012-01-12 DIAGNOSIS — R071 Chest pain on breathing: Secondary | ICD-10-CM

## 2012-01-12 LAB — CBC WITH DIFFERENTIAL/PLATELET
Basophils Absolute: 0.1 10*3/uL (ref 0.0–0.1)
HCT: 36.1 % (ref 36.0–46.0)
Lymphocytes Relative: 13 % (ref 12–46)
Lymphs Abs: 3.1 10*3/uL (ref 0.7–4.0)
MCV: 85.7 fL (ref 78.0–100.0)
Neutro Abs: 17.2 10*3/uL — ABNORMAL HIGH (ref 1.7–7.7)
Platelets: 318 10*3/uL (ref 150–400)
RBC: 4.21 MIL/uL (ref 3.87–5.11)
RDW: 13.2 % (ref 11.5–15.5)
WBC: 23.3 10*3/uL — ABNORMAL HIGH (ref 4.0–10.5)

## 2012-01-12 LAB — POCT URINALYSIS DIPSTICK
Leukocytes, UA: NEGATIVE
Nitrite, UA: NEGATIVE
Protein, UA: >=300
Urobilinogen, UA: 1
pH, UA: 5.5

## 2012-01-12 LAB — CK TOTAL AND CKMB (NOT AT ARMC): CK, MB: 1.4 ng/mL (ref 0.3–4.0)

## 2012-01-12 MED ORDER — NITROFURANTOIN MONOHYD MACRO 100 MG PO CAPS: 100.0000 mg | ORAL_CAPSULE | Freq: Two times a day (BID) | ORAL | Status: DC

## 2012-01-12 MED ORDER — PROMETHAZINE HCL 25 MG/ML IJ SOLN
25.0000 mg | Freq: Four times a day (QID) | INTRAMUSCULAR | Status: DC | PRN
  Administered 2012-01-12: 25 mg via INTRAMUSCULAR

## 2012-01-12 MED ORDER — MELOXICAM 7.5 MG PO TABS: 7.5000 mg | ORAL_TABLET | Freq: Every day | ORAL | Status: DC

## 2012-01-12 MED ORDER — KETOROLAC TROMETHAMINE 30 MG/ML IJ SOLN
30.0000 mg | Freq: Once | INTRAMUSCULAR | Status: AC
  Administered 2012-01-12: 30 mg via INTRAVENOUS

## 2012-01-12 NOTE — Progress Notes (Signed)
  Subjective:    Patient ID: Jennifer Erickson, female    DOB: September 03, 1970, 41 y.o.   MRN: [id number]  HPI Patient presents to the clinic to follow up after seeing Dr. Thurmond Butts and being diagnosed with Costochondritis. All of cardiac work up was negative. Her WBC were elevated on CBC at a little over 16,000. She did feel better after shot of Toradol. She still has right sided chest pain that is made worse when she takes deep breath in. Yesterday she also started to vomit. She has not been able to keep anything down. She has not vomited this morning. She has not taken temperature but feels warm and then hot. Denies any lower extremity muscle pains. She does have a headache but no sinus pressure. Ears do hurt and feel like there is pressure behind them. No ST. She has felt some pressure and pain with urination. She is having some loose stools but no diarrhea. When she was able to keep pills down aleve did take the edge off of symptoms. Denies any wheezing or excessive cough. Her chest x-ray on 10/10 was negative for any pulmonary issues. Her CK was elevated slightly. She is very fatigued.  She is on OCP. NO recent surgery, no lower edema swelling, no recent immoblization. No history of blood clots.     Review of Systems     Objective:   Physical Exam  Constitutional: She is oriented to person, place, and time. She appears well-developed and well-nourished.       Obese. Large Breasts.  HENT:  Head: Normocephalic and atraumatic.  Right Ear: External ear normal.  Left Ear: External ear normal.  Nose: Nose normal.  Mouth/Throat: Oropharynx is clear and moist. No oropharyngeal exudate.  Eyes: Conjunctivae normal are normal.  Neck: Normal range of motion. Neck supple.  Cardiovascular: Normal rate, regular rhythm and normal heart sounds.   Pulmonary/Chest:       SP02 is 96.   Effort decreased due to pain with deep breath. NO wheezing heard on exam.   No CVA tenderness.  Abdominal: Soft. Bowel  sounds are normal. She exhibits no distension. There is no tenderness.  Lymphadenopathy:    She has no cervical adenopathy.  Neurological: She is alert and oriented to person, place, and time.  Skin: Skin is warm and dry.  Psychiatric: She has a normal mood and affect. Her behavior is normal.          Assessment & Plan:  Right flank pain/dysuria- UA dipstick +Bilirubin/+blood.and protein. Will send for culture. Went ahead and treated for UTI with Macrobid. Encouraged patient to stay hydrated.   Nausea and vomiting- Gave shot of phenergan in office. Offered oral anti-nausea. She did not want at this time. Told her to call back and would give at any time. If not improving in 24-48 hours please call office. CMP was normal 5 days ago will recheck if N/V continues.   Right sided chest wall pain/S0B- Will reorder CBC to recheck WBC. Will also recheck CK to make sure not trending up. Gave another shot of Toradol. Start Mobic daily and gave samples of nexium to take along side to make sure she does not have any GI upset. Blood clots/Pe low on differential. Will consider if not improving.

## 2012-01-12 NOTE — Patient Instructions (Addendum)
Go get labs to recheck from 10/10. Will call with results. I am going to give you a stronger anti-inflammatory to use daily for at least 7 days. Gave samples of nexium to take in adjunct. If not improving or worsening. in the next week then call office. Advance diet as tolerated. Call office if vomiting is not better in next 24-48 hrs.   I am going to treat since blood in urine. Will give macrobid to pharmacy.  B.R.A.T. Diet Your doctor has recommended the B.R.A.T. diet for you or your child until the condition improves. This is often used to help control diarrhea and vomiting symptoms. If you or your child can tolerate clear liquids, you may have:  Bananas.   Rice.   Applesauce.   Toast (and other simple starches such as crackers, potatoes, noodles).  Be sure to avoid dairy products, meats, and fatty foods until symptoms are better. Fruit juices such as apple, grape, and prune juice can make diarrhea worse. Avoid these. Continue this diet for 2 days or as instructed by your caregiver. Document Released: 03/17/2005 Document Revised: 03/06/2011 Document Reviewed: 09/03/2006 [facility] Patient Information 2012 [place], [place].  Costochondritis Costochondritis (Tietze syndrome), or costochondral separation, is a swelling and irritation (inflammation) of the tissue (cartilage) that connects your ribs with your breastbone (sternum). It may occur on its own (spontaneously), through damage caused by an accident (trauma), or simply from coughing or minor exercise. It may take up to 6 weeks to get better and longer if you are unable to be conservative in your activities. HOME CARE INSTRUCTIONS   Avoid exhausting physical activity. Try not to strain your ribs during normal activity. This would include any activities using chest, belly (abdominal), and side muscles, especially if heavy weights are used.  Use ice for 15 to 20 minutes per hour while awake for the first 2 days. Place the ice in a plastic  bag, and place a towel between the bag of ice and your skin.  Only take over-the-counter or prescription medicines for pain, discomfort, or fever as directed by your caregiver. SEEK IMMEDIATE MEDICAL CARE IF:   Your pain increases or you are very uncomfortable.  You have a fever.  You develop difficulty with your breathing.  You cough up blood.  You develop worse chest pains, shortness of breath, sweating, or vomiting.  You develop new, unexplained problems (symptoms). MAKE SURE YOU:   Understand these instructions.  Will watch your condition.  Will get help right away if you are not doing well or get worse. Document Released: 12/25/2004 Document Revised: 06/09/2011 Document Reviewed: 11/03/2007 [facility] Patient Information 2013 [place], [place].

## 2012-01-14 ENCOUNTER — Other Ambulatory Visit: Payer: Self-pay | Admitting: Physician Assistant

## 2012-01-14 DIAGNOSIS — R109 Unspecified abdominal pain: Secondary | ICD-10-CM

## 2012-01-14 DIAGNOSIS — R0602 Shortness of breath: Secondary | ICD-10-CM

## 2012-01-14 DIAGNOSIS — R0789 Other chest pain: Secondary | ICD-10-CM

## 2012-01-15 ENCOUNTER — Ambulatory Visit (INDEPENDENT_AMBULATORY_CARE_PROVIDER_SITE_OTHER): Payer: Federal, State, Local not specified - PPO | Admitting: Family Medicine

## 2012-01-15 ENCOUNTER — Ambulatory Visit (HOSPITAL_BASED_OUTPATIENT_CLINIC_OR_DEPARTMENT_OTHER)
Admission: RE | Admit: 2012-01-15 | Discharge: 2012-01-15 | Disposition: A | Discharge status: Home / Self Care | Payer: Federal, State, Local not specified - PPO | Source: Ambulatory Visit | Attending: Physician Assistant | Admitting: Physician Assistant

## 2012-01-15 ENCOUNTER — Encounter: Payer: Self-pay | Admitting: Family Medicine

## 2012-01-15 VITALS — BP 134/92 | HR 91 | Ht 66.0 in | Wt 257.0 lb

## 2012-01-15 DIAGNOSIS — R0602 Shortness of breath: Secondary | ICD-10-CM

## 2012-01-15 DIAGNOSIS — N12 Tubulo-interstitial nephritis, not specified as acute or chronic: Secondary | ICD-10-CM

## 2012-01-15 DIAGNOSIS — N200 Calculus of kidney: Secondary | ICD-10-CM | POA: Insufficient documentation

## 2012-01-15 DIAGNOSIS — R109 Unspecified abdominal pain: Secondary | ICD-10-CM

## 2012-01-15 DIAGNOSIS — R0789 Other chest pain: Secondary | ICD-10-CM

## 2012-01-15 DIAGNOSIS — R079 Chest pain, unspecified: Secondary | ICD-10-CM | POA: Insufficient documentation

## 2012-01-15 DIAGNOSIS — R5381 Other malaise: Secondary | ICD-10-CM

## 2012-01-15 DIAGNOSIS — J329 Chronic sinusitis, unspecified: Secondary | ICD-10-CM

## 2012-01-15 DIAGNOSIS — R1011 Right upper quadrant pain: Secondary | ICD-10-CM | POA: Insufficient documentation

## 2012-01-15 MED ORDER — ONDANSETRON 8 MG PO TBDP: 8.0000 mg | ORAL_TABLET | Freq: Three times a day (TID) | ORAL | Status: DC | PRN

## 2012-01-15 MED ORDER — CEFIXIME 400 MG PO TABS: 400.0000 mg | ORAL_TABLET | Freq: Every day | ORAL | Status: DC

## 2012-01-15 MED ORDER — CEFTRIAXONE SODIUM 1 G IJ SOLR
1.0000 g | Freq: Once | INTRAMUSCULAR | Status: AC
  Administered 2012-01-15: 1 g via INTRAMUSCULAR

## 2012-01-15 MED ORDER — IOHEXOL 300 MG/ML  SOLN
100.0000 mL | Freq: Once | INTRAMUSCULAR | Status: AC | PRN
  Administered 2012-01-15: 100 mL via INTRAVENOUS

## 2012-01-15 NOTE — Patient Instructions (Addendum)
Continue with the Macrobid antibiotic and add Suprax 400 mg one tablet a day as well. Uses Zofran for nausea as needed. Return to office followup on Tuesday.Pyelonephritis, Adult Pyelonephritis is a kidney infection. In general, there are 2 main types of pyelonephritis:  Infections that come on quickly without any warning (acute pyelonephritis).  Infections that persist for a long period of time (chronic pyelonephritis). CAUSES  Two main causes of pyelonephritis are:  Bacteria traveling from the bladder to the kidney. This is a problem especially in pregnant women. The urine in the bladder can become filled with bacteria from multiple causes, including:  Inflammation of the prostate gland (prostatitis).  Sexual intercourse in females.  Bladder infection (cystitis).  Bacteria traveling from the bloodstream to the tissue part of the kidney. Problems that may increase your risk of getting a kidney infection include:  Diabetes.  Kidney stones or bladder stones.  Cancer.  Catheters placed in the bladder.  Other abnormalities of the kidney or ureter. SYMPTOMS   Abdominal pain.  Pain in the side or flank area.  Fever.  Chills.  Upset stomach.  Blood in the urine (dark urine).  Frequent urination.  Strong or persistent urge to urinate.  Burning or stinging when urinating. DIAGNOSIS  Your caregiver may diagnose your kidney infection based on your symptoms. A urine sample may also be taken. TREATMENT  In general, treatment depends on how severe the infection is.   If the infection is mild and caught early, your caregiver may treat you with oral antibiotics and send you home.  If the infection is more severe, the bacteria may have gotten into the bloodstream. This will require intravenous (IV) antibiotics and a hospital stay. Symptoms may include:  High fever.  Severe flank pain.  Shaking chills.  Even after a hospital stay, your caregiver may require you to be on  oral antibiotics for a period of time.  Other treatments may be required depending upon the cause of the infection. HOME CARE INSTRUCTIONS   Take your antibiotics as directed. Finish them even if you start to feel better.  Make an appointment to have your urine checked to make sure the infection is gone.  Drink enough fluids to keep your urine clear or pale yellow.  Take medicines for the bladder if you have urgency and frequency of urination as directed by your caregiver. SEEK IMMEDIATE MEDICAL CARE IF:   You have a fever or persistent symptoms for more than 2-3 days.  You have a fever and your symptoms suddenly get worse.  You are unable to take your antibiotics or fluids.  You develop shaking chills.  You experience extreme weakness or fainting.  There is no improvement after 2 days of treatment. MAKE SURE YOU:  Understand these instructions.  Will watch your condition.  Will get help right away if you are not doing well or get worse. Document Released: 03/17/2005 Document Revised: 09/16/2011 Document Reviewed: 08/21/2010 [facility] Patient Information 2013 [place], [place].

## 2012-01-15 NOTE — Progress Notes (Signed)
  Subjective:    Patient ID: Jennifer Erickson, female    DOB: [birth year], 41 y.o.   MRN: [id number]  HPI  Patient general malaise not feeling well and hurting all over. She states that she is miserable since seeing Ms.Breback on a Monday. Apparently her white count had gone up and so they were concerned. Not sure why but she was unable to get her CT scan of her abdomen until today which was abnormal showing possible kidney abscess and what appears to be pyelonephritis on CT scan. She is afebrile now but did go to contrast and feels miserable.  Review of Systems  Constitutional: Positive for activity change and appetite change.  HENT: Positive for nosebleeds, facial swelling, rhinorrhea, trouble swallowing and postnasal drip.   Gastrointestinal: Positive for nausea and vomiting.  Neurological: Positive for dizziness and light-headedness.  Psychiatric/Behavioral: Positive for disturbed wake/sleep cycle.      BP 134/92  Pulse 91  Ht 5\' 6"  (1.676 m)  Wt 257 lb (116.574 kg)  BMI 41.48 kg/m2  SpO2 98% Objective:   Physical Exam  Constitutional: She is oriented to person, place, and time. She appears well-developed and well-nourished. She appears listless. She is cooperative. She appears ill.  HENT:  Head: Normocephalic.  Right Ear: Hearing, tympanic membrane and external ear normal.  Left Ear: Hearing and tympanic membrane normal.  Nose: Mucosal edema, rhinorrhea and sinus tenderness present. No septal deviation. Right sinus exhibits maxillary sinus tenderness. Right sinus exhibits no frontal sinus tenderness. Left sinus exhibits maxillary sinus tenderness. Left sinus exhibits no frontal sinus tenderness.  Mouth/Throat: Mucous membranes are normal. Normal dentition. No uvula swelling or dental caries. No tonsillar abscesses.  Eyes: Pupils are equal, round, and reactive to light.       Reassured patient uvula swelling was not present that she's had before  Neck: Normal range of motion.  Neck supple. No thyromegaly present.  Cardiovascular: Normal rate, regular rhythm and normal heart sounds.   Pulmonary/Chest: Effort normal and breath sounds normal. No respiratory distress.  Abdominal: Soft. She exhibits no distension. There is no tenderness.  Neurological: She is oriented to person, place, and time. She appears listless.  Skin: Skin is warm and dry.  Psychiatric: Her mood appears anxious. She exhibits a depressed mood.      Assessment & Plan:  #1 possible pyelonephritis. Patient refuses/declines hospitalization. In fact she does not want to take a more time off work. Explained to her that that may not be her choice. Will give 1 g of Rocephin IM here. Continue the Macrobid we'll also add Suprax 400 milligrams tablets a day. Obtain urine culture sensitivity but that may not show anything since she has been on Macrobid. Return Tuesday for followup. Work note given for Monday if she chooses to use it. Zofran for nausea as needed.  #2 sinusitis explained to patient she will be on Suprax which will work on her sinuses. Recommend taking her Flonase nasal spray and her decongestant of choice. Reassured that the uvula swelling is not prominent at this time.

## 2012-01-18 LAB — CULTURE, URINE COMPREHENSIVE: Colony Count: NO GROWTH

## 2012-01-20 ENCOUNTER — Encounter: Payer: Self-pay | Admitting: Family Medicine

## 2012-01-20 ENCOUNTER — Encounter (HOSPITAL_BASED_OUTPATIENT_CLINIC_OR_DEPARTMENT_OTHER): Payer: Federal, State, Local not specified - PPO

## 2012-01-20 ENCOUNTER — Ambulatory Visit (INDEPENDENT_AMBULATORY_CARE_PROVIDER_SITE_OTHER): Payer: Federal, State, Local not specified - PPO | Admitting: Family Medicine

## 2012-01-20 VITALS — BP 131/78 | HR 105 | Ht 66.0 in | Wt 254.0 lb

## 2012-01-20 DIAGNOSIS — R079 Chest pain, unspecified: Secondary | ICD-10-CM

## 2012-01-20 DIAGNOSIS — N159 Renal tubulo-interstitial disease, unspecified: Secondary | ICD-10-CM

## 2012-01-20 DIAGNOSIS — N939 Abnormal uterine and vaginal bleeding, unspecified: Secondary | ICD-10-CM

## 2012-01-20 DIAGNOSIS — N898 Other specified noninflammatory disorders of vagina: Secondary | ICD-10-CM

## 2012-01-20 DIAGNOSIS — R0781 Pleurodynia: Secondary | ICD-10-CM

## 2012-01-20 DIAGNOSIS — N12 Tubulo-interstitial nephritis, not specified as acute or chronic: Secondary | ICD-10-CM

## 2012-01-20 DIAGNOSIS — G4730 Sleep apnea, unspecified: Secondary | ICD-10-CM

## 2012-01-20 LAB — POCT URINE PREGNANCY: Preg Test, Ur: NEGATIVE

## 2012-01-20 MED ORDER — FLUCONAZOLE 150 MG PO TABS: ORAL_TABLET | ORAL | Status: DC

## 2012-01-20 NOTE — Progress Notes (Signed)
  Subjective:    Patient ID: Jennifer Erickson, female    DOB: 1971-02-17, 41 y.o.   MRN: [id number]  HPI #1 chest pain she reports feeling much better with improvement of the chest pain. She reports the pain over her right ribs have diminished. There is still some pain but nothing like it was before. #2 pyelonephritis. Patient reports her friends were very concerned about her health and well-being. She reports being exhausted when she was here last Thursday and after arguing with me that she was going to go back to work Thursday night she was unable to function. She has been trying to go to work since Friday but only able to stay for 4-6 because of the tremendous fatigue and general malaise. Fortunately she is now feeling much better.   #3 possible side effects of antibiotics. She's worried about developing a yeast infection, she has noticed some spotting and has had some bleeding which she describes as being more old vaginal blood. She does not have a period anymore after having an IUD replaced due to side effects she is on a continuous pill which prevents her cycle. She also reports having some diarrhea which is actually improved.   #4 we received notice that she did not go to her sleep apnea testing.   #5 once again offered flu vaccination.  Review of Systems  Constitutional: Positive for activity change and appetite change. Negative for fever and fatigue.  Musculoskeletal: Negative for myalgias.       Back pain and myalgias have all improved  Neurological:       Improved weakness.      BP 131/78  Pulse 105  Ht 5\' 6"  (1.676 m)  Wt 254 lb (115.214 kg)  BMI 41.00 kg/m2  SpO2 98% Objective:   Physical Exam  Vitals reviewed. Constitutional: She is oriented to person, place, and time. She appears well-developed and well-nourished. No distress.       Looks much better today.  HENT:  Head: Normocephalic.  Musculoskeletal: She exhibits no edema and no tenderness.  Neurological: She  is alert and oriented to person, place, and time.  Skin: She is not diaphoretic.       Assessment & Plan:  #1 chest pain. Much better. Retrospectively the chest pain on the right and right rib pains may have been referred from the pyelonephritis. #2 pyelonephritis improving. We'll have her continue finishing the Suprax antibiotic. Work note written allowing her to work 4-6 hours a day for the rest of this week. Will also have her return in 4-6 weeks and agree with her that ultrasound of the kidney would probably be a good test to make sure that no abscesses left. #3 side effects of medication. We'll give her Diflucan prescription for possible yeast infection. Vaginal bleeding should stop on his own if it was caused by the pyelonephritis. Pregnancy test was negative. If the bleeding continues after douching would recommend followup here or with her GYN. If the diarrhea persists may need a dose of Flagyl for 10 days but would first see if it continues to improve. #4 sleep apnea encourage her to followup with the sleep apnea testing site. #5 she continues to decline flu vaccination.    Return in 4-6 weeks for followup.

## 2012-01-20 NOTE — Patient Instructions (Signed)
Pyelonephritis, Adult  Pyelonephritis is a kidney infection. In general, there are 2 main types of pyelonephritis:   Infections that come on quickly without any warning (acute pyelonephritis).   Infections that persist for a long period of time (chronic pyelonephritis).  CAUSES   Two main causes of pyelonephritis are:   Bacteria traveling from the bladder to the kidney. This is a problem especially in pregnant women. The urine in the bladder can become filled with bacteria from multiple causes, including:   Inflammation of the prostate gland (prostatitis).   Sexual intercourse in females.   Bladder infection (cystitis).   Bacteria traveling from the bloodstream to the tissue part of the kidney.  Problems that may increase your risk of getting a kidney infection include:   Diabetes.   Kidney stones or bladder stones.   Cancer.   Catheters placed in the bladder.   Other abnormalities of the kidney or ureter.  SYMPTOMS    Abdominal pain.   Pain in the side or flank area.   Fever.   Chills.   Upset stomach.   Blood in the urine (dark urine).   Frequent urination.   Strong or persistent urge to urinate.   Burning or stinging when urinating.  DIAGNOSIS   Your caregiver may diagnose your kidney infection based on your symptoms. A urine sample may also be taken.  TREATMENT   In general, treatment depends on how severe the infection is.    If the infection is mild and caught early, your caregiver may treat you with oral antibiotics and send you home.   If the infection is more severe, the bacteria may have gotten into the bloodstream. This will require intravenous (IV) antibiotics and a hospital stay. Symptoms may include:   High fever.   Severe flank pain.   Shaking chills.   Even after a hospital stay, your caregiver may require you to be on oral antibiotics for a period of time.   Other treatments may be required depending upon the cause of the infection.  HOME CARE INSTRUCTIONS    Take your  antibiotics as directed. Finish them even if you start to feel better.   Make an appointment to have your urine checked to make sure the infection is gone.   Drink enough fluids to keep your urine clear or pale yellow.   Take medicines for the bladder if you have urgency and frequency of urination as directed by your caregiver.  SEEK IMMEDIATE MEDICAL CARE IF:    You have a fever or persistent symptoms for more than 2-3 days.   You have a fever and your symptoms suddenly get worse.   You are unable to take your antibiotics or fluids.   You develop shaking chills.   You experience extreme weakness or fainting.   There is no improvement after 2 days of treatment.  MAKE SURE YOU:   Understand these instructions.   Will watch your condition.   Will get help right away if you are not doing well or get worse.  Document Released: 03/17/2005 Document Revised: 09/16/2011 Document Reviewed: 08/21/2010  ExitCare Patient Information 2013 [employer].

## 2012-01-28 ENCOUNTER — Ambulatory Visit (HOSPITAL_BASED_OUTPATIENT_CLINIC_OR_DEPARTMENT_OTHER): Payer: Federal, State, Local not specified - PPO | Attending: Family Medicine | Admitting: Radiology

## 2012-01-28 VITALS — Ht 66.0 in | Wt 260.0 lb

## 2012-01-28 DIAGNOSIS — G4733 Obstructive sleep apnea (adult) (pediatric): Secondary | ICD-10-CM | POA: Insufficient documentation

## 2012-01-31 DIAGNOSIS — G4733 Obstructive sleep apnea (adult) (pediatric): Secondary | ICD-10-CM

## 2012-02-01 NOTE — Procedures (Signed)
NAME:  Jennifer Erickson, Jennifer Erickson        ACCOUNT NO.:  [id number]  MEDICAL RECORD NO.:  [id number]          PATIENT TYPE:  OUT  LOCATION:  SLEEP CENTER                 FACILITY:  [facility]  PHYSICIAN:  Clinton D. Maple Hudson, MD, FCCP, FACPDATE OF BIRTH:  12-Jan-1971  DATE OF STUDY:                           NOCTURNAL POLYSOMNOGRAM  REFERRING PHYSICIAN:  EUGENE WADE  INDICATION FOR STUDY:  Hypersomnia with sleep apnea.  EPWORTH SLEEPINESS SCORE:  A 13/24.  BMI 42, weight 260 pounds.  Height 66 inches, neck 14 inches.  MEDICATIONS:  Home medications are charted and reviewed.  This study was done as a daytime sleep protocol to accommodate her usual sleep schedule.  Lights out at 9:24 a.m. and study ended at 3:04 p.m.  SLEEP ARCHITECTURE:  Split study protocol.  During the diagnostic phase, a total sleep time 130 minutes with sleep efficiency 73.4%.  Stage I was 12.7%, stage II 77.7%.  Stage III 0.8%, REM 8.8% of total sleep time. Sleep latency 19 minutes, REM latency 85 minutes.  Awake after sleep onset 16.5 minutes.  Arousal index 17.1.  BEDTIME MEDICATION:  None.  RESPIRATORY DATA:  Split study protocol.  Apnea-hypopnea index (AHI) 14.8 per hour.  A total of 32 events was scored including 1 obstructive apnea and 31 hypopneas.  Most events were associated with supine sleep position.  REM AHI 99.1 per hour.  CPAP was then titrated to 13 CWP, AHI 0 per hour.  She wore a small ResMed Mirage Quattro full-face mask with heated humidifier and a C-Flex setting of 3.  OXYGEN DATA:  Very loud snoring before CPAP with oxygen desaturation to a nadir of 83% on room air.  With CPAP control, snoring was prevented and mean oxygen saturation held at 96.8% on room air.  CARDIAC DATA:  Normal sinus rhythm.  MOVEMENT-PARASOMNIA:  No significant movement disturbance.  Bathroom x1.  IMPRESSIONS-RECOMMENDATIONS: 1. Mild obstructive sleep apnea/hypopnea syndrome, AHI of 14.8 per     hour with mainly supine  events.  Very loud snoring with oxygen     desaturation to a nadir of 83% on room air. 2. Successful CPAP titration to 13 CWP, AHI 0 per hour.  She wore a     small ResMed Mirage Quattro  full face     mask with heated humidifier and C-Flex setting of 3.  Snoring was     prevented and mean oxygen saturation held 96.8% on room air with     CPAP.     Clinton D. Maple Hudson, MD, [facility], FACP Diplomate, American Board of Sleep Medicine    CDY/MEDQ  D:  01/31/2012 13:09:22  T:  02/01/2012 01:12:24  Job:  [id number]

## 2012-02-10 ENCOUNTER — Telehealth: Payer: Self-pay | Admitting: *Deleted

## 2012-02-10 NOTE — Telephone Encounter (Signed)
Pt calls and states had sleep study done and they are calling to set up a time for Cpap. States hasn't heard from you on results and would like to hear from you before doing this

## 2012-02-10 NOTE — Telephone Encounter (Signed)
Please inform patient that Dr. Maple Hudson a pulmonologist at [facility] did review her studies. It was his opinion that she would benefit greatly from CPAP and I've asked the home health agency that works with:[facility] to initiate CPAP at the settings recommended by Dr. Maple Hudson.

## 2012-02-11 NOTE — Telephone Encounter (Signed)
Patient advised.

## 2012-02-25 ENCOUNTER — Encounter: Payer: Self-pay | Admitting: Physician Assistant

## 2012-02-25 ENCOUNTER — Emergency Department (HOSPITAL_BASED_OUTPATIENT_CLINIC_OR_DEPARTMENT_OTHER): Payer: Federal, State, Local not specified - PPO

## 2012-02-25 ENCOUNTER — Emergency Department (HOSPITAL_BASED_OUTPATIENT_CLINIC_OR_DEPARTMENT_OTHER)
Admission: EM | Admit: 2012-02-25 | Discharge: 2012-02-25 | Disposition: A | Discharge status: Home / Self Care | Payer: Federal, State, Local not specified - PPO | Attending: Emergency Medicine | Admitting: Emergency Medicine

## 2012-02-25 ENCOUNTER — Encounter (HOSPITAL_BASED_OUTPATIENT_CLINIC_OR_DEPARTMENT_OTHER): Payer: Self-pay | Admitting: Family Medicine

## 2012-02-25 ENCOUNTER — Ambulatory Visit (INDEPENDENT_AMBULATORY_CARE_PROVIDER_SITE_OTHER): Payer: Federal, State, Local not specified - PPO | Admitting: Physician Assistant

## 2012-02-25 VITALS — BP 139/66 | HR 78 | Temp 97.9°F | Ht 66.0 in | Wt 262.0 lb

## 2012-02-25 DIAGNOSIS — Z87440 Personal history of urinary (tract) infections: Secondary | ICD-10-CM

## 2012-02-25 DIAGNOSIS — N912 Amenorrhea, unspecified: Secondary | ICD-10-CM | POA: Insufficient documentation

## 2012-02-25 DIAGNOSIS — Z792 Long term (current) use of antibiotics: Secondary | ICD-10-CM | POA: Insufficient documentation

## 2012-02-25 DIAGNOSIS — J329 Chronic sinusitis, unspecified: Secondary | ICD-10-CM | POA: Insufficient documentation

## 2012-02-25 DIAGNOSIS — Z791 Long term (current) use of non-steroidal anti-inflammatories (NSAID): Secondary | ICD-10-CM | POA: Insufficient documentation

## 2012-02-25 DIAGNOSIS — R300 Dysuria: Secondary | ICD-10-CM

## 2012-02-25 DIAGNOSIS — N898 Other specified noninflammatory disorders of vagina: Secondary | ICD-10-CM | POA: Insufficient documentation

## 2012-02-25 DIAGNOSIS — R109 Unspecified abdominal pain: Secondary | ICD-10-CM

## 2012-02-25 DIAGNOSIS — N390 Urinary tract infection, site not specified: Secondary | ICD-10-CM

## 2012-02-25 DIAGNOSIS — R350 Frequency of micturition: Secondary | ICD-10-CM | POA: Insufficient documentation

## 2012-02-25 DIAGNOSIS — Z79899 Other long term (current) drug therapy: Secondary | ICD-10-CM | POA: Insufficient documentation

## 2012-02-25 DIAGNOSIS — Z87448 Personal history of other diseases of urinary system: Secondary | ICD-10-CM

## 2012-02-25 DIAGNOSIS — Z7982 Long term (current) use of aspirin: Secondary | ICD-10-CM | POA: Insufficient documentation

## 2012-02-25 LAB — POCT URINALYSIS DIPSTICK
Bilirubin, UA: NEGATIVE
Glucose, UA: NEGATIVE
Ketones, UA: NEGATIVE
Nitrite, UA: NEGATIVE
Spec Grav, UA: >=1.03
Urobilinogen, UA: 0.2
pH, UA: 6

## 2012-02-25 LAB — URINALYSIS, ROUTINE W REFLEX MICROSCOPIC
Bilirubin Urine: NEGATIVE
Glucose, UA: NEGATIVE mg/dL
Ketones, ur: NEGATIVE mg/dL
Nitrite: NEGATIVE
Protein, ur: NEGATIVE mg/dL
Specific Gravity, Urine: 1.018 (ref 1.005–1.030)
Urobilinogen, UA: 0.2 mg/dL (ref 0.0–1.0)
pH: 5.5 (ref 5.0–8.0)

## 2012-02-25 LAB — URINE MICROSCOPIC-ADD ON

## 2012-02-25 MED ORDER — CIPROFLOXACIN HCL 500 MG PO TABS: 500.0000 mg | ORAL_TABLET | Freq: Two times a day (BID) | ORAL | Status: DC

## 2012-02-25 NOTE — ED Notes (Signed)
Pt sts she was seen at [facility] today and diagnosed with UTI and told she should come here for repeat CT due to kidney issue.

## 2012-02-25 NOTE — Patient Instructions (Addendum)
I want you to go to [facility] ED. Tell allergic to contrast die. Need follow up CT. Suspect possible pyelonephritis. Had previous CT done 01/14/12 that was suspicious for cyst.   [phone] ([facility] Raygoza's Cell)

## 2012-02-25 NOTE — ED Notes (Signed)
Patient back from CT.

## 2012-02-25 NOTE — ED Provider Notes (Signed)
History     CSN: [id number]  Arrival date & time 02/25/12  1245   First MD Initiated Contact with Patient 02/25/12 1302      Chief Complaint  Patient presents with  . Urinary Tract Infection    (Consider location/radiation/quality/duration/timing/severity/associated sxs/prior treatment) HPI Comments: Patient presents with a two-day history of some right lower back pain and some urinary frequency. She was treated for pyelonephritis about one month ago and at that point had a CT scan which showed a possible renal cyst on the right versus micro abscess. She was treated initially with Macrobid and then Suprax. She was seen by her primary care physician in [place]. She saw her physician there today when she started having the symptoms again and they did a urine dipstick which showed trace leukocyte Estrace and felt that she needed a repeat CT scan on to make sure that there wasn't a worsening abscess on CT. She's had a constant throbbing to her right lower back since yesterday. She's had urinary frequency but no burning on urination. She denies any hematuria although she's had some vaginal spotting. She denies any other vaginal discharge. She denies any nausea or vomiting. Denies any fevers or chills. Denies a history of kidney problems in the past.  Patient is a 41 y.o. female presenting with urinary tract infection.  Urinary Tract Infection Pertinent negatives include no chest pain, no abdominal pain, no headaches and no shortness of breath.    Past Medical History  Diagnosis Date  . Personal history of amenorrhea   . Sinusitis     Past Surgical History  Procedure Date  . Dilation and curettage of uterus   . Tonsillectomy     Family History  Problem Relation Age of Onset  . Cancer Mother   . Hypertension Father   . Diabetes Brother     History  Substance Use Topics  . Smoking status: Never Smoker   . Smokeless tobacco: Never Used  . Alcohol Use: No    OB History    Grav Para Term Preterm Abortions TAB SAB Ect Mult Living                  Review of Systems  Constitutional: Negative for fever, chills, diaphoresis and fatigue.  HENT: Negative for congestion, rhinorrhea and sneezing.   Eyes: Negative.   Respiratory: Negative for cough, chest tightness and shortness of breath.   Cardiovascular: Negative for chest pain and leg swelling.  Gastrointestinal: Negative for nausea, vomiting, abdominal pain, diarrhea and blood in stool.  Genitourinary: Positive for frequency and vaginal bleeding. Negative for hematuria, flank pain, vaginal discharge and difficulty urinating.  Musculoskeletal: Positive for back pain. Negative for arthralgias.  Skin: Negative for rash.  Neurological: Negative for dizziness, speech difficulty, weakness, numbness and headaches.    Allergies  Aspirin and Ivp dye  Home Medications   Current Outpatient Rx  Name  Route  Sig  Dispense  Refill  . CEFIXIME 400 MG PO TABS   Oral   Take 1 tablet (400 mg total) by mouth daily.   14 tablet   0   . CIPROFLOXACIN HCL 500 MG PO TABS   Oral   Take 1 tablet (500 mg total) by mouth 2 (two) times daily.   14 tablet   0   . EPINEPHRINE 0.3 MG/0.3ML IJ DEVI   Intramuscular   Inject 0.3 mLs (0.3 mg total) into the muscle once.   1 Device   4   . FLUCONAZOLE  150 MG PO TABS      Take 1 now and repeat in 1 week   2 tablet   2   . LORATADINE-PSEUDOEPHEDRINE ER 10-240 MG PO TB24   Oral   Take 1 tablet by mouth daily.   30 tablet   6   . MELOXICAM 7.5 MG PO TABS   Oral   Take 1 tablet (7.5 mg total) by mouth daily.   30 tablet   0   . NITROFURANTOIN MONOHYD MACRO 100 MG PO CAPS   Oral   Take 1 capsule (100 mg total) by mouth 2 (two) times daily.   14 capsule   0   . NORETHINDRON-ETHINYL ESTRAD-FE 1-20/1-30/1-35 MG-MCG PO TABS   Oral   Take 1 tablet by mouth daily.         [name] ONDANSETRON 8 MG PO TBDP   Oral   Take 1 tablet (8 mg total) by mouth every 8 (eight)  hours as needed for nausea.   20 tablet   0   . RANITIDINE HCL 300 MG PO TABS   Oral   Take 1 tablet (300 mg total) by mouth at bedtime.   30 tablet   6   . SUMATRIPTAN SUCCINATE 100 MG PO TABS   Oral   Take 1 tablet (100 mg total) by mouth every 2 (two) hours as needed. Maximum 2 dosages in 24 hours   10 tablet   1     BP 118/80  Pulse 78  Temp 98.2 F (36.8 C) (Oral)  Resp 16  SpO2 100%  Physical Exam  Constitutional: She is oriented to person, place, and time. She appears well-developed and well-nourished.  HENT:  Head: Normocephalic and atraumatic.  Eyes: Pupils are equal, round, and reactive to light.  Neck: Normal range of motion. Neck supple.  Cardiovascular: Normal rate, regular rhythm and normal heart sounds.   Pulmonary/Chest: Effort normal and breath sounds normal. No respiratory distress. She has no wheezes. She has no rales. She exhibits no tenderness.  Abdominal: Soft. Bowel sounds are normal. There is no tenderness. There is no rebound and no guarding.       Mild tenderness to right lower back  Musculoskeletal: Normal range of motion. She exhibits no edema.  Lymphadenopathy:    She has no cervical adenopathy.  Neurological: She is alert and oriented to person, place, and time.  Skin: Skin is warm and dry. No rash noted.  Psychiatric: She has a normal mood and affect.    ED Course  Procedures (including critical care time)  Results for orders placed during the hospital encounter of 02/25/12  URINALYSIS, ROUTINE W REFLEX MICROSCOPIC      Component Value Range   Color, Urine YELLOW  YELLOW   APPearance CLOUDY (*) CLEAR   Specific Gravity, Urine 1.018  1.005 - 1.030   pH 5.5  5.0 - 8.0   Glucose, UA NEGATIVE  NEGATIVE mg/dL   Hgb urine dipstick LARGE (*) NEGATIVE   Bilirubin Urine NEGATIVE  NEGATIVE   Ketones, ur NEGATIVE  NEGATIVE mg/dL   Protein, ur NEGATIVE  NEGATIVE mg/dL   Urobilinogen, UA 0.2  0.0 - 1.0 mg/dL   Nitrite NEGATIVE  NEGATIVE    Leukocytes, UA MODERATE (*) NEGATIVE  URINE MICROSCOPIC-ADD ON      Component Value Range   Squamous Epithelial / LPF RARE  RARE   WBC, UA 0-2  <3 WBC/hpf   RBC / HPF TOO NUMEROUS TO COUNT  <3 RBC/hpf  Bacteria, UA FEW (*) RARE   Urine-Other MUCOUS PRESENT     Ct Abdomen Pelvis Wo Contrast  02/25/2012  *RADIOLOGY REPORT*  Clinical Data: Right flank pain, concern for renal abscess.  CT ABDOMEN AND PELVIS WITHOUT CONTRAST  Technique:  Multidetector CT imaging of the abdomen and pelvis was performed following the standard protocol without intravenous contrast.  Comparison: CT 01/15/2012  Findings: Lung bases are clear.  No pericardial fluid.  Non-IV contrast images demonstrate no focal hepatic lesion.  The gallbladder, pancreas, spleen, and adrenal glands are normal. There is a coarse calcification in the lower pole of the left kidney measuring 11 mm which is unchanged from prior.  No obstructive uropathy on the left.  No ureterolithiasis.  The right kidney is free of calculi as well as the right ureter.  The stomach, small bowel, and cecum are normal.  Appendix is not identified.  No pericecal inflammation.  The colon rectosigmoid colon are normal.  Abdominal aorta is normal caliber.  No retroperitoneal periportal lymphadenopathy. A tiny umbilical hernia.  No free fluid the pelvis.  The bladder and uterus and ovaries are normal.  No distal ureteral stones or bladder stones.  No pelvic lymphadenopathy. Review of  bone windows demonstrates no aggressive osseous lesions. 1.  IMPRESSION: 1. No acute abdominal or pelvic findings.  No change from prior. 2.  Nonobstructing calculus within the lower pole of the left kidney. 3.  Small umbilical hernia. 4. No abdominal abscess or renal abscess.   Original Report Authenticated By: Genevive Bi, M.D.       1. UTI (lower urinary tract infection)       MDM  Patient is well-appearing with no evidence of a renal abscess. I will start her on Cipro for her  urinary tract infection and advised her that she will need to followup with her primary care physician [facility] for recheck on her urine within next 1-2 weeks. Advised her to be seen sooner if her symptoms are not improving or return here as needed for any worsening symptoms.        Rolan Bucco, MD 02/25/12 [phone]

## 2012-02-25 NOTE — Progress Notes (Signed)
  Subjective:    Patient ID: Jennifer Erickson, female    DOB: 28-Apr-1970, 41 y.o.   MRN: [id number]  HPI Patient is a 41 yo female who presents to the clinic with dysuria, abdominal pressure, right sided flank pain and just not feeling right. She had pyelonephritis in October. Radiologist suggested follow up CT if symptoms reoccured. She was feeling better but has never felt back to 100%. She was allergic to contrast dye when she had CT in October. She was treated with Rochephin, suprax, and macrobid. She denies and fever, chills, N/V. She has not tried anything to make better.    Review of Systems     Objective:   Physical Exam  Constitutional: She is oriented to person, place, and time. She appears well-developed and well-nourished.       Obese.  HENT:  Head: Normocephalic and atraumatic.  Cardiovascular: Normal rate, regular rhythm and normal heart sounds.   Pulmonary/Chest: Effort normal and breath sounds normal.       CVA tenderness right side only.  Abdominal: Soft. Bowel sounds are normal. There is no tenderness.  Neurological: She is alert and oriented to person, place, and time.  Skin: Skin is warm and dry.  Psychiatric: She has a normal mood and affect. Her behavior is normal.          Assessment & Plan:  Suspected pyleonephritis/Right flank pain/dysuria- UA positive for leuks and blood; however not a very big sample of urine. I wanted to get repeat CT to evaluate Kidneys and questionable abscess formation; therefore, I sent to [place] ED to have done via protocol for allergy to contrast. I am also concerned that she might need to be treated with IV abx since October Cocktail may not have completely eliminated infection. After ED evaluation she should be treated appropriately after testing. Pt informed that if she did not go or get any abx she is to call me and I will send something in. I do believe that if pt is not treated with abx it could developed into more.

## 2012-02-25 NOTE — ED Notes (Signed)
Patient transported to CT via stretcher.

## 2012-02-27 LAB — URINE CULTURE
Colony Count: 10000
Special Requests: NORMAL

## 2012-02-28 NOTE — ED Notes (Signed)
+   Urine Patient treated with cipro-sensitive to samec-chart appended per protocol MD.

## 2012-03-02 ENCOUNTER — Ambulatory Visit: Payer: Federal, State, Local not specified - PPO | Admitting: Family Medicine

## 2012-04-29 ENCOUNTER — Encounter: Payer: Self-pay | Admitting: Family Medicine

## 2012-04-29 ENCOUNTER — Ambulatory Visit (INDEPENDENT_AMBULATORY_CARE_PROVIDER_SITE_OTHER): Payer: Federal, State, Local not specified - PPO | Admitting: Family Medicine

## 2012-04-29 VITALS — BP 131/81 | HR 94 | Temp 98.0°F | Resp 18 | Wt 260.0 lb

## 2012-04-29 DIAGNOSIS — R350 Frequency of micturition: Secondary | ICD-10-CM

## 2012-04-29 DIAGNOSIS — N912 Amenorrhea, unspecified: Secondary | ICD-10-CM

## 2012-04-29 DIAGNOSIS — Z87448 Personal history of other diseases of urinary system: Secondary | ICD-10-CM

## 2012-04-29 DIAGNOSIS — Z87440 Personal history of urinary (tract) infections: Secondary | ICD-10-CM

## 2012-04-29 DIAGNOSIS — R109 Unspecified abdominal pain: Secondary | ICD-10-CM

## 2012-04-29 LAB — POCT URINALYSIS DIPSTICK
Bilirubin, UA: NEGATIVE
Glucose, UA: NEGATIVE
Ketones, UA: NEGATIVE
Leukocytes, UA: NEGATIVE
Nitrite, UA: NEGATIVE
pH, UA: 7

## 2012-04-29 LAB — POCT URINE PREGNANCY: Preg Test, Ur: NEGATIVE

## 2012-04-29 MED ORDER — CYCLOBENZAPRINE HCL 10 MG PO TABS: 10.0000 mg | ORAL_TABLET | Freq: Every evening | ORAL | Status: DC | PRN

## 2012-04-29 NOTE — Patient Instructions (Addendum)
Can try Ibuprofen 600mg  up to 3 times a day.  Try the muscle relaxer at bedtime.   Gentle stretches of the low back.   Call if any fever or feel you are getting worse

## 2012-04-29 NOTE — Progress Notes (Signed)
  Subjective:    Patient ID: Jennifer Erickson, female    DOB: [birth year], 42 y.o.   MRN: [id number]  HPI Stopped her OCP x 3 weeks and wants pregnancy test. She stopped it because she was on Symbicort as of antibiotics back in the fall that she started having irregular bleeding. She usually takes continuous birth control so doesn't usually have periods.   Urinary freq x 2 days.  Had ecoli UTI w/ pyelnephritits in November that was resistant to BActrim.  Urine is darker than usual.  Has some mild right low back pain. No bodyches or fever.  Does have a kidney stones on the left.  She says she drinks about 3 L of water a day. Thus she was concerned when her urine started to look darker than usual.  Right low back pain - constant low grade aching.  No aggreavting factors such as movement. She has not tried anti-inflammatory. She has not tried a heating pad or ice pack. She had similar pain when she had, nephritis back in October of 2013. She does not have any fevers and denies feeling achy like she did then.  Review of Systems     Objective:   Physical Exam  Constitutional: She is oriented to person, place, and time. She appears well-developed and well-nourished.  HENT:  Head: Normocephalic and atraumatic.  Cardiovascular: Normal rate, regular rhythm and normal heart sounds.   Pulmonary/Chest: Effort normal and breath sounds normal.  Musculoskeletal:       Tender over the right low back.  Hip with NROM.  Non-tender over the spine.    Neurological: She is alert and oriented to person, place, and time.  Skin: Skin is warm and dry.  Psychiatric: She has a normal mood and affect. Her behavior is normal.          Assessment & Plan:  Right low Back Pain - Likely MSK in nature. She is very feaful it is pyelnephritis. Recommend trial of over-the-counter anti-inflammatory. Instructions given. I did write for muscle relaxer as well to use at bedtime as needed. Work on low back stretches. If  she feels it's getting worse or not improving over the next week then please let us know. She does have a heating pad at home and I encouraged her to try using this.  Urinary freq - UA was neg for infection, but will send for culture.  Call if suddenly gets worse, or starts to notice any urinary symptoms. She does have a followup with urology in about 2 weeks. I gave her printouts of the CT scans as well as her last urine culture to take with her to her appointment..   Amenorrhea-her urine pregnancy test is negative today. Gave reassurance. She can go ahead and restart her birth control.

## 2012-05-02 LAB — URINE CULTURE: Colony Count: 4000

## 2012-05-12 ENCOUNTER — Ambulatory Visit: Payer: Federal, State, Local not specified - PPO | Admitting: Physician Assistant

## 2012-05-26 ENCOUNTER — Ambulatory Visit (INDEPENDENT_AMBULATORY_CARE_PROVIDER_SITE_OTHER): Payer: Federal, State, Local not specified - PPO | Admitting: Family Medicine

## 2012-05-26 VITALS — BP 138/93 | HR 88 | Temp 98.3°F | Wt 261.0 lb

## 2012-05-26 DIAGNOSIS — L259 Unspecified contact dermatitis, unspecified cause: Secondary | ICD-10-CM

## 2012-05-26 DIAGNOSIS — L309 Dermatitis, unspecified: Secondary | ICD-10-CM | POA: Insufficient documentation

## 2012-05-26 DIAGNOSIS — N390 Urinary tract infection, site not specified: Secondary | ICD-10-CM

## 2012-05-26 DIAGNOSIS — R300 Dysuria: Secondary | ICD-10-CM

## 2012-05-26 DIAGNOSIS — M79609 Pain in unspecified limb: Secondary | ICD-10-CM

## 2012-05-26 DIAGNOSIS — M79672 Pain in left foot: Secondary | ICD-10-CM

## 2012-05-26 LAB — POCT URINALYSIS DIPSTICK
Ketones, UA: NEGATIVE
Protein, UA: NEGATIVE
Urobilinogen, UA: 0.2
pH, UA: 6

## 2012-05-26 MED ORDER — CIPROFLOXACIN HCL 250 MG PO TABS: ORAL_TABLET | ORAL | Status: AC

## 2012-05-26 MED ORDER — FLUCONAZOLE 150 MG PO TABS: ORAL_TABLET | ORAL | Status: AC

## 2012-05-26 NOTE — Progress Notes (Signed)
CC: Jennifer Erickson is a 42 y.o. female is here for left ankle pain and Dysuria   Subjective: HPI:  Patient presents concerned of UTI. For the past 4 days she has had mild dysuria. Symptoms are identical to that which led to pyelonephritis last year. No interventions as of yet. Nothing seems to make the symptoms better or worse but they are not apparent when she's not urinating. Symptoms are present any hour of the day but not during sleep. Discomfort is localized to the external urethra. She denies fevers, chills, nausea, vomiting, abdominal pain, back pain, flank pain, vaginal bleeding, nor any other genitourinary complaints  Patient complains of left foot pain. It has been present for a undetermined number of months and seems to be getting worse on monthly basis. Is described only as a pain, it is moderate in severity. It is worse when standing for long periods of time and improves greatly with rest. At its worst it is moderate to severe in severity. She denies trauma. She has tried over-the-counter inserts but has not helped with her pain. No interventions otherwise. Is localized on the dorsal lateral surface of the foot. It is described only as a pain  Patient complains of a rash on the left arm. It has been present for 2-3 weeks. It came on after scratching her arm while shaving. It seems to get worse the more she shaves her arms. It does not itch and does not painful. No interventions as of yet. No skin lesions elsewhere. She's never had this before.   Review Of Systems Outlined In HPI  Past Medical History  Diagnosis Date  . Personal history of amenorrhea   . Sinusitis      Family History  Problem Relation Age of Onset  . Cancer Mother   . Hypertension Father   . Diabetes Brother      History  Substance Use Topics  . Smoking status: Never Smoker   . Smokeless tobacco: Never Used  . Alcohol Use: No     Objective: Filed Vitals:   05/26/12 1015  BP: 138/93  Pulse:  88  Temp: 98.3 F (36.8 C)    General: Alert and Oriented, No Acute Distress HEENT: Pupils equal, round, reactive to light. Conjunctivae clear. Moist mucous membranes  Lungs: Clear and comfortable work of breathing Cardiac: Regular rate and rhythm. Back: No CVA tenderness Extremities: No peripheral edema.  Strong peripheral pulses. She is able to bear weight on the left foot. Pain is not elicited with palpation of fifth metatarsal, navicular, nor medial malleolus. Mild pain with palpation of calcanofibular ligament. She has markedly pes planus on the left foot. Mental Status: No depression, anxiety, nor agitation. Skin: Warm and dry. There is mild erythema in a circular half centimeter diameter on the left forearm.  Assessment & Plan: Jennifer Erickson was seen today for left ankle pain and dysuria.  Diagnoses and associated orders for this visit:  Dysuria - Urinalysis Dipstick - Urine Culture  UTI (urinary tract infection) - ciprofloxacin (CIPRO) 250 MG tablet; Take one by mouth twice a day for five days. - fluconazole (DIFLUCAN) 150 MG tablet; Take one tab, may take second tab if no improvement after 72 hours.  Left foot pain  Dermatitis    Dysuria: Discussed suspicion for UTI given blood and leukocytes in urine. Reviewed past cultures, will prescribe Cipro pending culture from today. Patient requests Diflucan for history of yeast infections with antibiotics. Left foot pain: Discussed with her pain is likely do  to poor biomechanics in the setting of flatfootedness. Discussed low suspicion for stress fracture. She is open to the idea of referral to Dr. Karie Schwalbe. in sports medicine for further evaluation. She declines nonsteroidal anti-inflammatory medications Dermatitis: Discussed this is likely do to persistent irritation do to shaving, encouraged her to stop shaving the site of her arm and to apply hydrocortisone cream 1-2 times a day for the next 2 weeks  25 minutes spent face-to-face during  visit today of which at least 50% was counseling or coordinating care regarding dermatitis, left foot pain, UTI.   Return in about 1 week (around 06/02/2012), or sports med consult Dr. Karie Schwalbe.

## 2012-05-28 LAB — URINE CULTURE: Colony Count: 40000

## 2012-08-02 ENCOUNTER — Encounter: Payer: Self-pay | Admitting: Emergency Medicine

## 2012-08-02 ENCOUNTER — Emergency Department
Admission: EM | Admit: 2012-08-02 | Discharge: 2012-08-02 | Disposition: A | Discharge status: Home / Self Care | Payer: Federal, State, Local not specified - PPO | Source: Home / Self Care | Attending: Family Medicine | Admitting: Family Medicine

## 2012-08-02 DIAGNOSIS — J069 Acute upper respiratory infection, unspecified: Secondary | ICD-10-CM

## 2012-08-02 MED ORDER — METHYLPREDNISOLONE SODIUM SUCC 125 MG IJ SOLR
125.0000 mg | Freq: Once | INTRAMUSCULAR | Status: AC
  Administered 2012-08-02: 125 mg via INTRAMUSCULAR

## 2012-08-02 MED ORDER — BENZONATATE 200 MG PO CAPS: 200.0000 mg | ORAL_CAPSULE | Freq: Every day | ORAL | Status: DC

## 2012-08-02 MED ORDER — AMOXICILLIN 875 MG PO TABS: 875.0000 mg | ORAL_TABLET | Freq: Two times a day (BID) | ORAL | Status: DC

## 2012-08-02 MED ORDER — PREDNISONE 20 MG PO TABS: 20.0000 mg | ORAL_TABLET | Freq: Two times a day (BID) | ORAL | Status: DC

## 2012-08-02 NOTE — ED Notes (Signed)
Reports progressively worsening of sinus congestion with cough over past 5 days.

## 2012-08-02 NOTE — ED Provider Notes (Signed)
History     CSN: [id number]  Arrival date & time 08/02/12  2003   First MD Initiated Contact with Patient 08/02/12 2031      Chief Complaint  Patient presents with  . Nasal Congestion       HPI Comments: Patient has a history of seasonal rhinitis and one week ago developed increased sinus congestion and sneezing that has not responded to a variety of antihistamines.  She has developed fatigue, chills, myalgias, headache and a cough.  In the mornings upon awakening, she feels that her uvula is swollen and she feels shortness of breath.  Upon arising, the swelling resolves.  She has had no sore throat.  The history is provided by the patient.    Past Medical History  Diagnosis Date  . Personal history of amenorrhea   . Sinusitis     Past Surgical History  Procedure Laterality Date  . Dilation and curettage of uterus    . Tonsillectomy      Family History  Problem Relation Age of Onset  . Cancer Mother   . Hypertension Father   . Diabetes Brother     History  Substance Use Topics  . Smoking status: Never Smoker   . Smokeless tobacco: Never Used  . Alcohol Use: No    OB History   Grav Para Term Preterm Abortions TAB SAB Ect Mult Living                  Review of Systems + sore throat + cough No pleuritic pain + wheezing + nasal congestion + post-nasal drainage ? sinus pain/pressure No itchy/red eyes No earache No hemoptysis + SOB No fever, + chills No nausea No vomiting No abdominal pain No diarrhea No urinary symptoms No skin rashes + fatigue + myalgias + headache Used OTC meds without relief  Allergies  Aspirin and Ivp dye  Home Medications   Current Outpatient Rx  Name  Route  Sig  Dispense  Refill  . amoxicillin (AMOXIL) 875 MG tablet   Oral   Take 1 tablet (875 mg total) by mouth 2 (two) times daily. (Rx void after 08/10/12)   20 tablet   0   . benzonatate (TESSALON) 200 MG capsule   Oral   Take 1 capsule (200 mg total) by mouth  at bedtime.   12 capsule   0   . cyclobenzaprine (FLEXERIL) 10 MG tablet   Oral   Take 1 tablet (10 mg total) by mouth at bedtime as needed for muscle spasms.   20 tablet   0   . EPINEPHrine (EPIPEN 2-PAK) 0.3 mg/0.3 mL DEVI   Intramuscular   Inject 0.3 mLs (0.3 mg total) into the muscle once.   1 Device   4   . norethindrone-ethinyl estradiol-iron (ESTROSTEP FE,TILIA FE,TRI-LEGEST FE) 1-20/1-30/1-35 MG-MCG tablet   Oral   Take 1 tablet by mouth daily.         . predniSONE (DELTASONE) 20 MG tablet   Oral   Take 1 tablet (20 mg total) by mouth 2 (two) times daily. Take with food.   10 tablet   0   . SUMAtriptan (IMITREX) 100 MG tablet   Oral   Take 1 tablet (100 mg total) by mouth every 2 (two) hours as needed. Maximum 2 dosages in 24 hours   10 tablet   1     BP 121/80  Pulse 81  Temp(Src) 97.9 F (36.6 C) (Oral)  Resp 18  Ht 5'  6" (1.676 m)  Wt 260 lb (117.935 kg)  BMI 41.99 kg/m2  SpO2 100%  Physical Exam Nursing notes and Vital Signs reviewed. Appearance:  Patient appears stated age, and in no acute distress.  Patient is obese (BMI 42.0) Eyes:  Pupils are equal, round, and reactive to light and accomodation.  Extraocular movement is intact.  Conjunctivae are not inflamed  Ears:  Canals normal.  Tympanic membranes normal.  Nose:  Mildly congested turbinates.  No sinus tenderness.   Pharynx:  Normal;  No erythema and no swelling of uvula noted. Neck:  Supple.   Tender shotty posterior nodes are palpated bilaterally  Lungs:  Clear to auscultation.  Breath sounds are equal.  Chest:  Distinct tenderness to palpation over the mid-sternum.  Heart:  Regular rate and rhythm without murmurs, rubs, or gallops.  Abdomen:  Nontender without masses or hepatosplenomegaly.  Bowel sounds are present.  No CVA or flank tenderness.  Extremities:  No edema.  No calf tenderness Skin:  No rash present.   ED Course  Procedures  none      1. Acute upper respiratory  infections of unspecified site; suspect viral URI       MDM  Solumedrol 125mg  IM;  Prescription written for Benzonatate (Tessalon) to take at bedtime for night-time cough.  Take Mucinex D (guaifenesin with decongestant) twice daily for congestion.  Increase fluid intake, rest. Begin prednisone on Tuesday morning. May use Afrin nasal spray (or generic oxymetazoline) twice daily for about 5 days.  Also recommend using saline nasal spray several times daily and saline nasal irrigation (AYR is a common brand).  Use Flonase spray after using Afrin and saline irrigation. Stop all antihistamines for now, and other non-prescription cough/cold preparations. Begin Amoxicillin if not improving about 5 days or if persistent fever develops (Given a prescription to hold, with an expiration date)  Follow-up with family doctor if not improving 7 to 10 days.         Lattie Haw, MD 08/04/12 [phone]

## 2012-10-20 ENCOUNTER — Encounter: Payer: Self-pay | Admitting: Physician Assistant

## 2012-10-20 ENCOUNTER — Ambulatory Visit (INDEPENDENT_AMBULATORY_CARE_PROVIDER_SITE_OTHER): Payer: Federal, State, Local not specified - PPO | Admitting: Physician Assistant

## 2012-10-20 VITALS — BP 143/91 | HR 81 | Wt 265.0 lb

## 2012-10-20 DIAGNOSIS — M79609 Pain in unspecified limb: Secondary | ICD-10-CM

## 2012-10-20 DIAGNOSIS — M722 Plantar fascial fibromatosis: Secondary | ICD-10-CM

## 2012-10-20 DIAGNOSIS — M79661 Pain in right lower leg: Secondary | ICD-10-CM

## 2012-10-20 MED ORDER — MELOXICAM 7.5 MG PO TABS: ORAL_TABLET | ORAL | Status: DC

## 2012-10-20 NOTE — Progress Notes (Signed)
  Subjective:    Patient ID: Jennifer Erickson, female    DOB: 22-Oct-1970, 42 y.o.   MRN: [id number]  HPI Patient presents to the clinic with bilateral plantar facittis pain/swelling and right calf pain. She has ongoing feet and joint issues for many years.recent pain been worsening over the past month. She stands on concrete every day and wears steel toed boots. Her feet have become more and more painful. Last night pain was 7/10 today it is more manageable at a 3/10. She wears orthotics but does not like to take anything by mouth. She has had injections before in fascia but not recently. She does not do any stretches/icing. She admits to being worse after work. Swelling is occuring on the bottom of foot which sometimes makes it hard to maintain her balance. Her calf pain occurred after feet began to ache. NO trauma to leg, to injury, no swelling, no erythema, no briusing. She has not had any recent surgeries. She is not currently taking birth control and has not been immobilized. Right calf and up behind knee hurts when walking the most.    Review of Systems     Objective:   Physical Exam  Constitutional: She appears well-developed and well-nourished.  Cardiovascular: Normal rate, regular rhythm and normal heart sounds.   Musculoskeletal:  Minimal swelling around bilateral heels and middle of plantar aspect of foot. Pain with palpation over heel and fascia of bilateral feet. ROM full and normal. Strength 5/5.   No swelling, bruising, erythema over right calf. Tenderness with palpation over calf and behind knee. ROM of right knee full.   Psychiatric: She has a normal mood and affect. Her behavior is normal.          Assessment & Plan:  Plantar fasciitis- discussed with patient I think she would benefit from an injection by Dr. Karie Schwalbe in office. Pt wanted to try other things first. I gave her mobic to take 1-2 tabs daily for at least 2 weeks. Pt does not like oral meds but strongly  recommended. She was given exercises and told to ice bottoms of foot. Continue to wear shoes with orthotics or strong support. If not improving schedule appt with Dr. Karie Schwalbe for injection.   Right calf pain- I suspect pain coming from walking and posture of feet pain. I think NSAIDs will help. Gave exercises for calf. Consider icing and stretching before any activities or heavy walking.  Follow up if not improving. Do not suspect blood clot discussed swelling, redness worsening pain to call office to potentially get U/S.  Elevated bp- suspect due to pain and discomfort. Will continue to monitor.   Spent 30 minutes with patient and greater than 50 percent of visit spent counseling on exercises and treatment plan for plantar fasciitis.

## 2012-10-25 ENCOUNTER — Telehealth: Payer: Self-pay

## 2012-10-25 NOTE — Telephone Encounter (Signed)
Patient called and left a message on nurse line asking for a return call.   Returned Call: Left message asking patient to call back.  

## 2012-10-29 ENCOUNTER — Other Ambulatory Visit (HOSPITAL_BASED_OUTPATIENT_CLINIC_OR_DEPARTMENT_OTHER): Payer: Self-pay | Admitting: Obstetrics & Gynecology

## 2012-10-29 DIAGNOSIS — Z1231 Encounter for screening mammogram for malignant neoplasm of breast: Secondary | ICD-10-CM

## 2012-11-01 ENCOUNTER — Encounter: Payer: Self-pay | Admitting: Sports Medicine

## 2012-11-01 ENCOUNTER — Ambulatory Visit (INDEPENDENT_AMBULATORY_CARE_PROVIDER_SITE_OTHER): Payer: Federal, State, Local not specified - PPO | Admitting: Sports Medicine

## 2012-11-01 ENCOUNTER — Ambulatory Visit (HOSPITAL_BASED_OUTPATIENT_CLINIC_OR_DEPARTMENT_OTHER)
Admission: RE | Admit: 2012-11-01 | Discharge: 2012-11-01 | Disposition: A | Discharge status: Home / Self Care | Payer: Federal, State, Local not specified - PPO | Source: Ambulatory Visit | Attending: Obstetrics & Gynecology | Admitting: Obstetrics & Gynecology

## 2012-11-01 VITALS — BP 142/90 | HR 91 | Wt 265.0 lb

## 2012-11-01 DIAGNOSIS — M722 Plantar fascial fibromatosis: Secondary | ICD-10-CM

## 2012-11-01 DIAGNOSIS — Z1231 Encounter for screening mammogram for malignant neoplasm of breast: Secondary | ICD-10-CM | POA: Insufficient documentation

## 2012-11-01 NOTE — Assessment & Plan Note (Signed)
Bilateral guided injections. She will return for custom orthotics. Continue Mobic. Work aggressively on home rehabilitation.

## 2012-11-01 NOTE — Progress Notes (Signed)
Subjective:    I'm seeing this patient as a consultation for: Tandy Gaw, PA-C   CC: Bilateral foot pain  HPI: This is a very pleasant 42 year old female who comes in with a long history of pain which he localizes on the calcaneal insertion of the plantar fascia, worse in the mornings, spends all day working on concrete. It is moderate, persistent. She's had injections by a podiatrist in the past, as well as custom orthotics created by the podiatrist. These were moderately effective, injections lasted approximately 2-3 weeks. Unfortunately pain is persistent, she desires interventional treatment today.  Past medical history, Surgical history, Family history not pertinant except as noted below, Social history, Allergies, and medications have been entered into the medical record, reviewed, and no changes needed.   Review of Systems: No headache, visual changes, nausea, vomiting, diarrhea, constipation, dizziness, abdominal pain, skin rash, fevers, chills, night sweats, weight loss, swollen lymph nodes, body aches, joint swelling, muscle aches, chest pain, shortness of breath, mood changes, visual or auditory hallucinations.   Objective:   General: Well Developed, well nourished, and in no acute distress.  Neuro/Psych: Alert and oriented x3, extra-ocular muscles intact, able to move all 4 extremities, sensation grossly intact. Skin: Warm and dry, no rashes noted.  Respiratory: Not using accessory muscles, speaking in full sentences, trachea midline.  Cardiovascular: Pulses palpable, no extremity edema. Abdomen: Does not appear distended. Bilateral Foot: No visible erythema or swelling. Range of motion is full in all directions. Strength is 5/5 in all directions. No hallux valgus. Mild pes planus. No abnormal callus noted. No pain over the navicular prominence, or base of fifth metatarsal. Tender to palpation of the calcaneal insertion of plantar fascia. No pain at the Achilles  insertion. No pain over the calcaneal bursa. No pain of the retrocalcaneal bursa. No tenderness to palpation over the tarsals, metatarsals, or phalanges. No hallux rigidus or limitus. No tenderness palpation over interphalangeal joints. No pain with compression of the metatarsal heads. Neurovascularly intact distally.  Procedure: Real-time Ultrasound Guided Injection of left plantar fascia Device: GE Logiq E  Verbal informed consent obtained.  Time-out conducted.  Noted no overlying erythema, induration, or other signs of local infection.  Skin prepped in a sterile fashion.  Local anesthesia: Topical Ethyl chloride.  With sterile technique and under real time ultrasound guidance:  25-gauge needle advanced just deep to the plantar fascia, to the calcaneal insertion, 1 cc Kenalog 40, 3 cc lidocaine injected easily. Completed without difficulty  Pain immediately resolved suggesting accurate placement of the medication.  Advised to call if fevers/chills, erythema, induration, drainage, or persistent bleeding.  Images permanently stored and available for review in the ultrasound unit.  Impression: Technically successful ultrasound guided injection.  Procedure: Real-time Ultrasound Guided Injection of right plantar fascia Device: GE Logiq E  Verbal informed consent obtained.  Time-out conducted.  Noted no overlying erythema, induration, or other signs of local infection.  Skin prepped in a sterile fashion.  Local anesthesia: Topical Ethyl chloride.  With sterile technique and under real time ultrasound guidance:  25-gauge needle advanced just deep to the plantar fascia, to the calcaneal insertion, 1 cc Kenalog 40, 3 cc lidocaine injected easily. Completed without difficulty  Pain immediately resolved suggesting accurate placement of the medication.  Advised to call if fevers/chills, erythema, induration, drainage, or persistent bleeding.  Images permanently stored and available for review  in the ultrasound unit.  Impression: Technically successful ultrasound guided injection. Impression and Recommendations:   This case required  medical decision making of moderate complexity.

## 2012-11-02 ENCOUNTER — Other Ambulatory Visit: Payer: Self-pay | Admitting: Obstetrics & Gynecology

## 2012-11-02 DIAGNOSIS — R928 Other abnormal and inconclusive findings on diagnostic imaging of breast: Secondary | ICD-10-CM

## 2012-11-03 ENCOUNTER — Ambulatory Visit (INDEPENDENT_AMBULATORY_CARE_PROVIDER_SITE_OTHER): Payer: Federal, State, Local not specified - PPO | Admitting: Physician Assistant

## 2012-11-03 ENCOUNTER — Encounter: Payer: Self-pay | Admitting: Sports Medicine

## 2012-11-03 ENCOUNTER — Encounter: Payer: Self-pay | Admitting: Physician Assistant

## 2012-11-03 ENCOUNTER — Ambulatory Visit (INDEPENDENT_AMBULATORY_CARE_PROVIDER_SITE_OTHER): Payer: Federal, State, Local not specified - PPO | Admitting: Sports Medicine

## 2012-11-03 VITALS — BP 138/87 | HR 83 | Wt 269.0 lb

## 2012-11-03 VITALS — BP 126/88 | HR 78 | Wt 266.0 lb

## 2012-11-03 DIAGNOSIS — R5383 Other fatigue: Secondary | ICD-10-CM

## 2012-11-03 DIAGNOSIS — R5381 Other malaise: Secondary | ICD-10-CM

## 2012-11-03 DIAGNOSIS — M722 Plantar fascial fibromatosis: Secondary | ICD-10-CM

## 2012-11-03 NOTE — Progress Notes (Signed)
    Patient was fitted for a : standard, cushioned, semi-rigid orthotic. The orthotic was heated and afterward the patient stood on the orthotic blank positioned on the orthotic stand. The patient was positioned in subtalar neutral position and 10 degrees of ankle dorsiflexion in a weight bearing stance. After completion of molding, a stable base was applied to the orthotic blank. The blank was ground to a stable position for weight bearing. Size:10 Base: Blue EVA Additional Posting and Padding: None The patient ambulated these, and they were very comfortable.  I spent 40 minutes with this patient, greater than 50% was face-to-face time counseling regarding the below diagnosis.   

## 2012-11-03 NOTE — Assessment & Plan Note (Signed)
Orthotics as above, continue exercises, return one month, repeat injection if no better.

## 2012-11-04 LAB — COMPLETE METABOLIC PANEL WITH GFR
ALT: 20 U/L (ref 0–35)
Albumin: 4 g/dL (ref 3.5–5.2)
CO2: 23 mEq/L (ref 19–32)
Calcium: 9.4 mg/dL (ref 8.4–10.5)
Chloride: 106 mEq/L (ref 96–112)
GFR, Est African American: >89 mL/min
Glucose, Bld: 73 mg/dL (ref 70–99)
Sodium: 139 mEq/L (ref 135–145)
Total Protein: 7.2 g/dL (ref 6.0–8.3)

## 2012-11-04 LAB — CBC WITH DIFFERENTIAL/PLATELET
Hemoglobin: 13.2 g/dL (ref 12.0–15.0)
Lymphocytes Relative: 29 % (ref 12–46)
Lymphs Abs: 3.4 10*3/uL (ref 0.7–4.0)
MCV: 88 fL (ref 78.0–100.0)
Monocytes Relative: 7 % (ref 3–12)
Neutrophils Relative %: 63 % (ref 43–77)
Platelets: 327 10*3/uL (ref 150–400)
RBC: 4.5 MIL/uL (ref 3.87–5.11)
WBC: 12 10*3/uL — ABNORMAL HIGH (ref 4.0–10.5)

## 2012-11-04 LAB — EPSTEIN-BARR VIRUS VCA ANTIBODY PANEL
EBV EA IgG: <5 U/mL (ref <9.0)
EBV VCA IgM: <10 U/mL (ref <36.0)

## 2012-11-05 ENCOUNTER — Telehealth: Payer: Self-pay | Admitting: Physician Assistant

## 2012-11-05 NOTE — Telephone Encounter (Signed)
Call patient and see if would be interested in sleep study test to evaluate for sleep apnea as a cause of fatigue.

## 2012-11-05 NOTE — Telephone Encounter (Signed)
Spoke with patient & she states that she DOES have sleep apnea & already has a c-pap.  She's asking about her lab results as well.

## 2012-11-05 NOTE — Progress Notes (Signed)
  Subjective:    Patient ID: Jennifer Erickson, female    DOB: June 10, 1970, 42 y.o.   MRN: [id number]  HPI Patient is a 42 year old female who presents to the clinic with fatigue. Patient admits to working third shift and not getting adequate rest. She is not exercising and not watching her diet. When she is at work she is very active. She reports she feels tired all the time. She denies any feelings of hopelessness or helplessness. She denies that anything helps with the sensation that it does not seem that anything makes her fatigue worse. She has not had routine labs check. She does feel like she sleeps good when she does sleep is just not for long periods of time. Patient does snore. Patient denies any fever, chills, muscle aches, sinus pressure, ear pain, sore throat, nausea, vomiting, diarrhea.   Review of Systems     Objective:   Physical Exam  Constitutional: She is oriented to person, place, and time. She appears well-developed and well-nourished.  Obese.  HENT:  Head: Normocephalic and atraumatic.  Right Ear: External ear normal.  Left Ear: External ear normal.  Nose: Nose normal.  Mouth/Throat: Oropharynx is clear and moist.  Eyes: Conjunctivae are normal.  Neck: Normal range of motion. Neck supple. No thyromegaly present.  Cardiovascular: Normal rate, regular rhythm and normal heart sounds.   Pulmonary/Chest: Effort normal and breath sounds normal. She has no wheezes.  Abdominal: Soft. Bowel sounds are normal. There is no tenderness.  Neurological: She is alert and oriented to person, place, and time.  Skin: Skin is warm and dry.  Psychiatric: She has a normal mood and affect. Her behavior is normal.          Assessment & Plan:  Fatigue-fatigue work up was done with CBC, TSH, B12, vitamin D. We may need to consider sleep study test to rule out sleep apnea. I am not completely convinced that some of this fatigue might be caused by depression; however patient does deny  depression. Will continue to follow.

## 2012-11-10 ENCOUNTER — Other Ambulatory Visit: Payer: Self-pay | Admitting: Radiology

## 2012-11-11 ENCOUNTER — Encounter: Payer: Self-pay | Admitting: *Deleted

## 2012-11-12 ENCOUNTER — Encounter: Payer: Federal, State, Local not specified - PPO | Admitting: Physician Assistant

## 2012-11-12 NOTE — Telephone Encounter (Signed)
Pt been called with labs.

## 2012-11-18 ENCOUNTER — Other Ambulatory Visit: Payer: Federal, State, Local not specified - PPO

## 2012-11-22 ENCOUNTER — Encounter: Payer: Self-pay | Admitting: Physician Assistant

## 2012-11-22 ENCOUNTER — Ambulatory Visit (INDEPENDENT_AMBULATORY_CARE_PROVIDER_SITE_OTHER): Payer: Federal, State, Local not specified - PPO | Admitting: Physician Assistant

## 2012-11-22 VITALS — BP 111/81 | HR 74 | Wt 264.0 lb

## 2012-11-22 DIAGNOSIS — N63 Unspecified lump in unspecified breast: Secondary | ICD-10-CM

## 2012-11-22 DIAGNOSIS — Z0000 Encounter for general adult medical examination without abnormal findings: Secondary | ICD-10-CM

## 2012-11-22 DIAGNOSIS — N6320 Unspecified lump in the left breast, unspecified quadrant: Secondary | ICD-10-CM | POA: Insufficient documentation

## 2012-11-22 DIAGNOSIS — G4733 Obstructive sleep apnea (adult) (pediatric): Secondary | ICD-10-CM | POA: Insufficient documentation

## 2012-11-22 DIAGNOSIS — Z13220 Encounter for screening for lipoid disorders: Secondary | ICD-10-CM

## 2012-11-22 NOTE — Patient Instructions (Addendum)
Keeping You Healthy  Get These Tests 1. Blood Pressure- Have your blood pressure checked once a year by your health care provider.  Normal blood pressure is 120/80. 2. Weight- Have your body mass index (BMI) calculated to screen for obesity.  BMI is measure of body fat based on height and weight.  You can also calculate your own BMI at www.nhlbisupport.com/bmi/. 3. Cholesterol- Have your cholesterol checked every 5 years starting at age 42 then yearly starting at age 45. 4. Chlamydia, HIV, and other sexually transmitted diseases- Get screened every year until age 25, then within three months of each new sexual provider. 5. Pap Smear- Every 1-3 years; discuss with your health care provider. 6. Mammogram- Every year starting at age 40  Take these medicines  Calcium with Vitamin D-Your body needs 1200 mg of Calcium each day and 800-1000 IU of Vitamin D daily.  Your body can only absorb 500 mg of Calcium at a time so Calcium must be taken in 2 or 3 divided doses throughout the day.  Multivitamin with folic acid- Once daily if it is possible for you to become pregnant.  Get these Immunizations  Tetanus shot- Every 10 years.  Flu shot-Every year.  Take these steps 1. Do not smoke-Your healthcare provider can help you quit.  For tips on how to quit go to www.smokefree.gov or call 1-800 QUITNOW. 2. Be physically active- Exercise 5 days a week for at least 30 minutes.  If you are not already physically active, start slow and gradually work up to 30 minutes of moderate physical activity.  Examples of moderate activity include walking briskly, dancing, swimming, bicycling, etc. 3. Breast Cancer- A self breast exam every month is important for early detection of breast cancer.  For more information and instruction on self breast exams, ask your healthcare provider or www.womenshealth.gov/faq/breast-self-exam.cfm. 4. Eat a healthy diet- Eat a variety of healthy foods such as fruits, vegetables, whole  grains, low fat milk, low fat cheeses, yogurt, lean meats, poultry and fish, beans, nuts, tofu, etc.  For more information go to www. Thenutritionsource.org 5. Drink alcohol in moderation- Limit alcohol intake to one drink or less per day. Never drink and drive. 6. Depression- Your emotional health is as important as your physical health.  If you're feeling down or losing interest in things you normally enjoy please talk to your healthcare provider about being screened for depression. 7. Dental visit- Brush and floss your teeth twice daily; visit your dentist twice a year. 8. Eye doctor- Get an eye exam at least every 2 years. 9. Helmet use- Always wear a helmet when riding a bicycle, motorcycle, rollerblading or skateboarding. 10. Safe sex- If you may be exposed to sexually transmitted infections, use a condom. 11. Seat belts- Seat belts can save your live; always wear one. 12. Smoke/Carbon Monoxide detectors- These detectors need to be installed on the appropriate level of your home. Replace batteries at least once a year. 13. Skin cancer- When out in the sun please cover up and use sunscreen 15 SPF or higher. 14. Violence- If anyone is threatening or hurting you, please tell your healthcare provider.        

## 2012-11-22 NOTE — Progress Notes (Signed)
  Subjective:     Jennifer Erickson is a 42 y.o. female and is here for a comprehensive physical exam. The patient reports no problems.  Patient went to update medical record with left breast mass evaluated after biopsy to be benign.  Patient also reports to have much more energy and feels better than previous visits.  History   Social History  . Marital Status: Married    Spouse Name: N/A    Number of Children: N/A  . Years of Education: N/A   Occupational History  . Not on file.   Social History Main Topics  . Smoking status: Never Smoker   . Smokeless tobacco: Never Used  . Alcohol Use: No  . Drug Use: No  . Sexual Activity: Yes    Birth Control/ Protection: Pill   Other Topics Concern  . Not on file   Social History Narrative  . No narrative on file   Health Maintenance  Topic Date Due  . Influenza Vaccine  11/29/2012  . Pap Smear  06/08/2014  . Tetanus/tdap  09/08/2018    The following portions of the patient's history were reviewed and updated as appropriate: allergies, current medications, past family history, past medical history, past social history, past surgical history and problem list.  Review of Systems A comprehensive review of systems was negative.   Objective:    BP 111/81  Pulse 74  Wt 264 lb (119.75 kg)  BMI 42.63 kg/m2  LMP 11/20/2012 General appearance: alert, cooperative, appears stated age and moderately obese Head: Normocephalic, without obvious abnormality, atraumatic Eyes: conjunctivae/corneas clear. PERRL, EOM's intact. Fundi benign. Ears: normal TM's and external ear canals both ears Nose: Nares normal. Septum midline. Mucosa normal. No drainage or sinus tenderness. Throat: lips, mucosa, and tongue normal; teeth and gums normal Neck: no adenopathy, no carotid bruit, no JVD, supple, symmetrical, trachea midline and thyroid not enlarged, symmetric, no tenderness/mass/nodules Back: symmetric, no curvature. ROM normal. No CVA  tenderness. Heart: regular rate and rhythm, S1, S2 normal, no murmur, click, rub or gallop Abdomen: soft, non-tender; bowel sounds normal; no masses,  no organomegaly Extremities: extremities normal, atraumatic, no cyanosis or edema Pulses: 2+ and symmetric Skin: Skin color, texture, turgor normal. No rashes or lesions Lymph nodes: Cervical, supraclavicular, and axillary nodes normal. Neurologic: Grossly normal    Assessment:    Healthy female exam.      Plan:    CPE/left breast mass- patient recently had a mammogram and after biopsy mass was negative. She sees her OB/GYN for Pap smear. Call vaccines are up-to-date. Patient report trying to be more active. Encouraged patient to start calcium 1200 mg. I do not want her starting of vitamin D since her last vitamin D level was perfect and on the high side. Lab slip were given for fasting labs.  Patient declined flu shot today. See After Visit Summary for Counseling Recommendations

## 2012-12-06 ENCOUNTER — Ambulatory Visit: Payer: Federal, State, Local not specified - PPO | Admitting: Sports Medicine

## 2012-12-06 DIAGNOSIS — Z0289 Encounter for other administrative examinations: Secondary | ICD-10-CM

## 2012-12-07 LAB — LIPID PANEL
LDL Cholesterol: 130 mg/dL — ABNORMAL HIGH (ref 0–99)
Triglycerides: 64 mg/dL (ref <150)

## 2013-04-27 ENCOUNTER — Other Ambulatory Visit: Payer: Self-pay

## 2013-04-27 DIAGNOSIS — K1379 Other lesions of oral mucosa: Secondary | ICD-10-CM

## 2013-04-27 MED ORDER — EPINEPHRINE 0.3 MG/0.3ML IJ SOAJ: 0.3000 mg | Freq: Once | INTRAMUSCULAR | Status: DC

## 2013-04-28 ENCOUNTER — Telehealth: Payer: Self-pay | Admitting: *Deleted

## 2013-04-28 NOTE — Telephone Encounter (Signed)
Pt calls today asking if you need her to come back in for more FMLA paperwork.  She states that her employer is having her resubmit more.  I couldn't find any recent forms in her chart but she said you filled some out a few months back for edema in her feet.  Please advise.

## 2013-04-29 NOTE — Telephone Encounter (Signed)
Pt notified & was transferred to scheduling.

## 2013-04-29 NOTE — Telephone Encounter (Signed)
Yes I have not seen you since August 2014. Please bring forms in with you to fill out at visit. Amber please make 30 minute appt for this.

## 2013-05-02 ENCOUNTER — Ambulatory Visit (INDEPENDENT_AMBULATORY_CARE_PROVIDER_SITE_OTHER): Payer: Federal, State, Local not specified - PPO | Admitting: Physician Assistant

## 2013-05-02 ENCOUNTER — Encounter: Payer: Self-pay | Admitting: Physician Assistant

## 2013-05-02 VITALS — BP 119/74 | HR 82 | Wt 265.0 lb

## 2013-05-02 DIAGNOSIS — M7989 Other specified soft tissue disorders: Secondary | ICD-10-CM

## 2013-05-02 DIAGNOSIS — M722 Plantar fascial fibromatosis: Secondary | ICD-10-CM

## 2013-05-02 NOTE — Patient Instructions (Signed)
Plantar Fasciitis (Heel Spur Syndrome) with Rehab The plantar fascia is a fibrous, ligament-like, soft-tissue structure that spans the bottom of the foot. Plantar fasciitis is a condition that causes pain in the foot due to inflammation of the tissue. SYMPTOMS   Pain and tenderness on the underneath side of the foot.  Pain that worsens with standing or walking. CAUSES  Plantar fasciitis is caused by irritation and injury to the plantar fascia on the underneath side of the foot. Common mechanisms of injury include:  Direct trauma to bottom of the foot.  Damage to a small nerve that runs under the foot where the main fascia attaches to the heel bone.  Stress placed on the plantar fascia due to bone spurs. RISK INCREASES WITH:   Activities that place stress on the plantar fascia (running, jumping, pivoting, or cutting).  Poor strength and flexibility.  Improperly fitted shoes.  Tight calf muscles.  Flat feet.  Failure to warm-up properly before activity.  Obesity. PREVENTION  Warm up and stretch properly before activity.  Allow for adequate recovery between workouts.  Maintain physical fitness:  Strength, flexibility, and endurance.  Cardiovascular fitness.  Maintain a health body weight.  Avoid stress on the plantar fascia.  Wear properly fitted shoes, including arch supports for individuals who have flat feet. PROGNOSIS  If treated properly, then the symptoms of plantar fasciitis usually resolve without surgery. However, occasionally surgery is necessary. RELATED COMPLICATIONS   Recurrent symptoms that may result in a chronic condition.  Problems of the lower back that are caused by compensating for the injury, such as limping.  Pain or weakness of the foot during push-off following surgery.  Chronic inflammation, scarring, and partial or complete fascia tear, occurring more often from repeated injections. TREATMENT  Treatment initially involves the use of  ice and medication to help reduce pain and inflammation. The use of strengthening and stretching exercises may help reduce pain with activity, especially stretches of the Achilles tendon. These exercises may be performed at home or with a therapist. Your caregiver may recommend that you use heel cups of arch supports to help reduce stress on the plantar fascia. Occasionally, corticosteroid injections are given to reduce inflammation. If symptoms persist for greater than 6 months despite non-surgical (conservative), then surgery may be recommended.  MEDICATION   If pain medication is necessary, then nonsteroidal anti-inflammatory medications, such as aspirin and ibuprofen, or other minor pain relievers, such as acetaminophen, are often recommended.  Do not take pain medication within 7 days before surgery.  Prescription pain relievers may be given if deemed necessary by your caregiver. Use only as directed and only as much as you need.  Corticosteroid injections may be given by your caregiver. These injections should be reserved for the most serious cases, because they may only be given a certain number of times. HEAT AND COLD  Cold treatment (icing) relieves pain and reduces inflammation. Cold treatment should be applied for 10 to 15 minutes every 2 to 3 hours for inflammation and pain and immediately after any activity that aggravates your symptoms. Use ice packs or massage the area with a piece of ice (ice massage).  Heat treatment may be used prior to performing the stretching and strengthening activities prescribed by your caregiver, physical therapist, or athletic trainer. Use a heat pack or soak the injury in warm water. SEEK IMMEDIATE MEDICAL CARE IF:  Treatment seems to offer no benefit, or the condition worsens.  Any medications produce adverse side effects. EXERCISES RANGE   OF MOTION (ROM) AND STRETCHING EXERCISES - Plantar Fasciitis (Heel Spur Syndrome) These exercises may help you  when beginning to rehabilitate your injury. Your symptoms may resolve with or without further involvement from your physician, physical therapist or athletic trainer. While completing these exercises, remember:   Restoring tissue flexibility helps normal motion to return to the joints. This allows healthier, less painful movement and activity.  An effective stretch should be held for at least 30 seconds.  A stretch should never be painful. You should only feel a gentle lengthening or release in the stretched tissue. RANGE OF MOTION - Toe Extension, Flexion  Sit with your right / left leg crossed over your opposite knee.  Grasp your toes and gently pull them back toward the top of your foot. You should feel a stretch on the bottom of your toes and/or foot.  Hold this stretch for __________ seconds.  Now, gently pull your toes toward the bottom of your foot. You should feel a stretch on the top of your toes and or foot.  Hold this stretch for __________ seconds. Repeat __________ times. Complete this stretch __________ times per day.  RANGE OF MOTION - Ankle Dorsiflexion, Active Assisted  Remove shoes and sit on a chair that is preferably not on a carpeted surface.  Place right / left foot under knee. Extend your opposite leg for support.  Keeping your heel down, slide your right / left foot back toward the chair until you feel a stretch at your ankle or calf. If you do not feel a stretch, slide your bottom forward to the edge of the chair, while still keeping your heel down.  Hold this stretch for __________ seconds. Repeat __________ times. Complete this stretch __________ times per day.  STRETCH  Gastroc, Standing  Place hands on wall.  Extend right / left leg, keeping the front knee somewhat bent.  Slightly point your toes inward on your back foot.  Keeping your right / left heel on the floor and your knee straight, shift your weight toward the wall, not allowing your back to  arch.  You should feel a gentle stretch in the right / left calf. Hold this position for __________ seconds. Repeat __________ times. Complete this stretch __________ times per day. STRETCH  Soleus, Standing  Place hands on wall.  Extend right / left leg, keeping the other knee somewhat bent.  Slightly point your toes inward on your back foot.  Keep your right / left heel on the floor, bend your back knee, and slightly shift your weight over the back leg so that you feel a gentle stretch deep in your back calf.  Hold this position for __________ seconds. Repeat __________ times. Complete this stretch __________ times per day. STRETCH  Gastrocsoleus, Standing  Note: This exercise can place a lot of stress on your foot and ankle. Please complete this exercise only if specifically instructed by your caregiver.   Place the ball of your right / left foot on a step, keeping your other foot firmly on the same step.  Hold on to the wall or a rail for balance.  Slowly lift your other foot, allowing your body weight to press your heel down over the edge of the step.  You should feel a stretch in your right / left calf.  Hold this position for __________ seconds.  Repeat this exercise with a slight bend in your right / left knee. Repeat __________ times. Complete this stretch __________ times per day.    STRENGTHENING EXERCISES - Plantar Fasciitis (Heel Spur Syndrome)  These exercises may help you when beginning to rehabilitate your injury. They may resolve your symptoms with or without further involvement from your physician, physical therapist or athletic trainer. While completing these exercises, remember:   Muscles can gain both the endurance and the strength needed for everyday activities through controlled exercises.  Complete these exercises as instructed by your physician, physical therapist or athletic trainer. Progress the resistance and repetitions only as guided. STRENGTH - Towel  Curls  Sit in a chair positioned on a non-carpeted surface.  Place your foot on a towel, keeping your heel on the floor.  Pull the towel toward your heel by only curling your toes. Keep your heel on the floor.  If instructed by your physician, physical therapist or athletic trainer, add ____________________ at the end of the towel. Repeat __________ times. Complete this exercise __________ times per day. STRENGTH - Ankle Inversion  Secure one end of a rubber exercise band/tubing to a fixed object (table, pole). Loop the other end around your foot just before your toes.  Place your fists between your knees. This will focus your strengthening at your ankle.  Slowly, pull your big toe up and in, making sure the band/tubing is positioned to resist the entire motion.  Hold this position for __________ seconds.  Have your muscles resist the band/tubing as it slowly pulls your foot back to the starting position. Repeat __________ times. Complete this exercises __________ times per day.  Document Released: 03/17/2005 Document Revised: 06/09/2011 Document Reviewed: 06/29/2008 [facility] Patient Information 2014 [place], [place].

## 2013-05-02 NOTE — Progress Notes (Signed)
   Subjective:    Patient ID: Jennifer Erickson, female    DOB: 11-23-1970, 43 y.o.   MRN: [id number]  HPI Patient is a 43 year old female who presents to the clinic to followup own [place] laid there for her for periodic absences from work due to flares of plantar fasciitis and bilateral lower edema of feet. Patient works a job where on Sundays all she does is walk. When she has flares of these 2 conditions it makes it And possible to go to work. She reports that in the last 4 months she is only called in once or twice; however, she does find a lot of time off work so it makes it difficult for her to call and without FMLA papers. When she has an exacerbation of plantar fasciitis/swelling of feet she elevates, ices and takes ibuprofen which usually helps within 24-48 hours. She has noticed since getting more supportive pair she use she's had less flares.    Review of Systems     Objective:   Physical Exam  Constitutional: She appears well-developed and well-nourished.  HENT:  Head: Normocephalic and atraumatic.  Cardiovascular: Normal rate, regular rhythm and normal heart sounds.   Pulmonary/Chest: Effort normal and breath sounds normal.  Musculoskeletal:  Pedal pulses 2+ and symmetric. Good range of motion of both feet and ankles. No edema present today. No pain over the plantar or dorsal aspect of foot. No pain with plantar flexion. No pain with palpation over the heel.  Skin: Skin is dry.  Psychiatric: She has a normal mood and affect. Her behavior is normal.          Assessment & Plan:  Bilateral plantar fasciitis/bilateral lower feet edema- patient did not have FMLA  to fill out today. She will bring back. Discussed I will allow for her to be able to Miss 2 days at a time no more than twice a month. Discussed with patient how to keep symptoms at [place] with good supportive shoes daily, anti-inflammatories as needed, icing regularly, and proper stretches. Patient was given handout  today with proper stretches for plantar fasciitis. Followup if symptoms progressing and not staying controlled.

## 2013-05-30 ENCOUNTER — Encounter: Payer: Self-pay | Admitting: Physician Assistant

## 2013-05-30 ENCOUNTER — Ambulatory Visit (INDEPENDENT_AMBULATORY_CARE_PROVIDER_SITE_OTHER): Payer: Federal, State, Local not specified - PPO | Admitting: Physician Assistant

## 2013-05-30 VITALS — BP 124/81 | HR 78 | Wt 268.0 lb

## 2013-05-30 DIAGNOSIS — R0789 Other chest pain: Secondary | ICD-10-CM

## 2013-05-30 DIAGNOSIS — R071 Chest pain on breathing: Secondary | ICD-10-CM

## 2013-05-30 MED ORDER — CYCLOBENZAPRINE HCL 10 MG PO TABS: 10.0000 mg | ORAL_TABLET | Freq: Three times a day (TID) | ORAL | Status: DC | PRN

## 2013-05-30 NOTE — Patient Instructions (Addendum)
Ibuprofen 825m up to three times a day.  Flexeril as needed.  Warm compresses.  Try prilosex OTC 435mdaily.

## 2013-05-30 NOTE — Progress Notes (Signed)
   Subjective:    Patient ID: Jennifer Erickson, female    DOB: 1970-10-01, 43 y.o.   MRN: [id number]  HPI Pt is a 43 yo female who presents to the clinic with chest pain and tightness. She has had off and on over the past year and it would go away without any treatment. She woke up this morning with a constriction feeling under her breast. Feels hard for her to get a deep breath. She does not feel burpy or belchy. No abdominal pain but just pressure. She will feel episodes of intense pressure and then will wane off. Denies any sOB. Tried alka setzer with no relief. Denies any fever, chills, body aches, nausea or vomiting. Not started any new medications or supplements.    Review of Systems     Objective:   Physical Exam  Constitutional: She is oriented to person, place, and time. She appears well-developed and well-nourished.  Obese.   HENT:  Head: Normocephalic and atraumatic.  Eyes: Conjunctivae are normal.  Neck: Normal range of motion. Neck supple.  Cardiovascular: Normal rate, regular rhythm and normal heart sounds.   Pulmonary/Chest: Effort normal. She has no wheezes.  Pain with palpation over right chest wall.   Large breast bilaterally.   Abdominal: Soft. Bowel sounds are normal. She exhibits no distension and no mass. There is no tenderness. There is no rebound and no guarding.  Neurological: She is alert and oriented to person, place, and time.  Skin: Skin is dry.  Psychiatric: She has a normal mood and affect. Her behavior is normal.          Assessment & Plan:  Chest tightness/ right side chest wall pain-  EKG- NSR at 69bpm, No ST elevation, no arrhthymias. Will get CBC and CK to make sure no muscle break down. Does not seem cardiac in origin. Since tender to palpation over right side chest wall almost seems like costochondritis or musculoskeletal. Will try flexeril up to three times a day with ibuprofen 800 mg up to three times a day. Pt does have large breast I  am wondering if they could be causing some musclar strain.I am suspicious of GERD however no other symptoms. Discussed with pt if symptoms continue to take prilosec 62m daily in morning for 5-7 days and see if she can get relief. Follow up if continues or changes. Pt does not feel anxious or like having a panic attack.

## 2013-05-31 LAB — CBC WITH DIFFERENTIAL/PLATELET
BASOS ABS: 0 10*3/uL (ref 0.0–0.1)
BASOS PCT: 0 % (ref 0–1)
EOS ABS: 0.2 10*3/uL (ref 0.0–0.7)
EOS PCT: 2 % (ref 0–5)
HEMATOCRIT: 41 % (ref 36.0–46.0)
Hemoglobin: 14.1 g/dL (ref 12.0–15.0)
Lymphocytes Relative: 44 % (ref 12–46)
Lymphs Abs: 3.4 10*3/uL (ref 0.7–4.0)
MCH: 30.1 pg (ref 26.0–34.0)
MCHC: 34.4 g/dL (ref 30.0–36.0)
MCV: 87.6 fL (ref 78.0–100.0)
MONO ABS: 0.7 10*3/uL (ref 0.1–1.0)
Monocytes Relative: 9 % (ref 3–12)
Neutro Abs: 3.5 10*3/uL (ref 1.7–7.7)
Neutrophils Relative %: 45 % (ref 43–77)
PLATELETS: 305 10*3/uL (ref 150–400)
RBC: 4.68 MIL/uL (ref 3.87–5.11)
RDW: 14 % (ref 11.5–15.5)
WBC: 7.8 10*3/uL (ref 4.0–10.5)

## 2013-05-31 LAB — CK: Total CK: 146 U/L (ref 7–177)

## 2013-06-14 ENCOUNTER — Encounter: Payer: Self-pay | Admitting: *Deleted

## 2013-11-14 ENCOUNTER — Encounter: Payer: Self-pay | Admitting: Physician Assistant

## 2013-11-14 ENCOUNTER — Ambulatory Visit (INDEPENDENT_AMBULATORY_CARE_PROVIDER_SITE_OTHER): Payer: Federal, State, Local not specified - PPO

## 2013-11-14 ENCOUNTER — Ambulatory Visit (INDEPENDENT_AMBULATORY_CARE_PROVIDER_SITE_OTHER): Payer: Federal, State, Local not specified - PPO | Admitting: Physician Assistant

## 2013-11-14 VITALS — BP 127/67 | HR 86 | Ht 66.0 in | Wt 275.0 lb

## 2013-11-14 DIAGNOSIS — M79609 Pain in unspecified limb: Secondary | ICD-10-CM

## 2013-11-14 DIAGNOSIS — W19XXXA Unspecified fall, initial encounter: Secondary | ICD-10-CM

## 2013-11-14 DIAGNOSIS — M722 Plantar fascial fibromatosis: Secondary | ICD-10-CM | POA: Diagnosis not present

## 2013-11-14 DIAGNOSIS — M1710 Unilateral primary osteoarthritis, unspecified knee: Secondary | ICD-10-CM

## 2013-11-14 DIAGNOSIS — M25562 Pain in left knee: Secondary | ICD-10-CM

## 2013-11-14 DIAGNOSIS — M25569 Pain in unspecified knee: Secondary | ICD-10-CM | POA: Diagnosis not present

## 2013-11-14 DIAGNOSIS — M79675 Pain in left toe(s): Secondary | ICD-10-CM

## 2013-11-14 NOTE — Progress Notes (Signed)
   Subjective:    Patient ID: Jennifer Erickson, female    DOB: October 23, 1970, 43 y.o.   MRN: [id number]  HPI Pt presents to the clinic to have FMLA for plantar fasciitis filled out and to discuss injury of left great toe and left knee.   About 1 and 1/2 weeks ago pt missed the last 4 steps at her house and fell and landed on her left knee. She has continued to go to work and bear weight. Pain is bearable just a discomfort. Better with supportive shoe. Not taking anything by mouth. Pain with full extension of left knee. Great toe of left foot aches after a long day of walking. Pt has to walk a lot at work.     Review of Systems  All other systems reviewed and are negative.      Objective:   Physical Exam  Constitutional: She is oriented to person, place, and time. She appears well-developed and well-nourished.  HENT:  Head: Normocephalic and atraumatic.  Cardiovascular: Normal rate, regular rhythm and normal heart sounds.   Pulmonary/Chest: Effort normal and breath sounds normal.  Musculoskeletal:  Tenderness over left patella. Full extension can be done but painful.   significant swelling. No bruising. No pain over joint spaces.  Negative anterior drawer.  NO laxity of ACL, LCL, PCL, MCL.  Strength 5/5.  Patellar reflex 2+.    Pain to palpation over great toe.  Strength 5/5.  NROM  Neurological: She is alert and oriented to person, place, and time.  Skin: Skin is dry.  Psychiatric: She has a normal mood and affect. Her behavior is normal.          Assessment & Plan:  Plantar fascitis, bilateral- papers filled out and signed for prn days to missed due to pain and flare ups.   Fall/left knee pain/left great toe pain- will get xrays. Suspect contusion. Discussed ice, NSAID, REST, elevation and compression. Make sure wearing good supportive shoe. Written out of work for today and tomorrow. Follow up if not improving or if worsening.

## 2013-11-14 NOTE — Patient Instructions (Signed)
Knee Exercises EXERCISES RANGE OF MOTION (ROM) AND STRETCHING EXERCISES These exercises may help you when beginning to rehabilitate your injury. Your symptoms may resolve with or without further involvement from your physician, physical therapist, or athletic trainer. While completing these exercises, remember:   Restoring tissue flexibility helps normal motion to return to the joints. This allows healthier, less painful movement and activity.  An effective stretch should be held for at least 30 seconds.  A stretch should never be painful. You should only feel a gentle lengthening or release in the stretched tissue. STRETCH - Knee Extension, Prone  Lie on your stomach on a firm surface, such as a bed or countertop. Place your right / left knee and leg just beyond the edge of the surface. You may wish to place a towel under the far end of your right / left thigh for comfort.  Relax your leg muscles and allow gravity to straighten your knee. Your clinician may advise you to add an ankle weight if more resistance is helpful for you.  You should feel a stretch in the back of your right / left knee. Hold this position for __________ seconds. Repeat __________ times. Complete this stretch __________ times per day. * Your physician, physical therapist, or athletic trainer may ask you to add ankle weight to enhance your stretch.  RANGE OF MOTION - Knee Flexion, Active  Lie on your back with both knees straight. (If this causes back discomfort, bend your opposite knee, placing your foot flat on the floor.)  Slowly slide your heel back toward your buttocks until you feel a gentle stretch in the front of your knee or thigh.  Hold for __________ seconds. Slowly slide your heel back to the starting position. Repeat __________ times. Complete this exercise __________ times per day.  STRETCH - Quadriceps, Prone   Lie on your stomach on a firm surface, such as a bed or padded floor.  Bend your right /  left knee and grasp your ankle. If you are unable to reach your ankle or pant leg, use a belt around your foot to lengthen your reach.  Gently pull your heel toward your buttocks. Your knee should not slide out to the side. You should feel a stretch in the front of your thigh and/or knee.  Hold this position for __________ seconds. Repeat __________ times. Complete this stretch __________ times per day.  STRETCH - Hamstrings, Supine   Lie on your back. Loop a belt or towel over the ball of your right / left foot.  Straighten your right / left knee and slowly pull on the belt to raise your leg. Do not allow the right / left knee to bend. Keep your opposite leg flat on the floor.  Raise the leg until you feel a gentle stretch behind your right / left knee or thigh. Hold this position for __________ seconds. Repeat __________ times. Complete this stretch __________ times per day.  STRENGTHENING EXERCISES These exercises may help you when beginning to rehabilitate your injury. They may resolve your symptoms with or without further involvement from your physician, physical therapist, or athletic trainer. While completing these exercises, remember:   Muscles can gain both the endurance and the strength needed for everyday activities through controlled exercises.  Complete these exercises as instructed by your physician, physical therapist, or athletic trainer. Progress the resistance and repetitions only as guided.  You may experience muscle soreness or fatigue, but the pain or discomfort you are trying to eliminate  should never worsen during these exercises. If this pain does worsen, stop and make certain you are following the directions exactly. If the pain is still present after adjustments, discontinue the exercise until you can discuss the trouble with your clinician. STRENGTH - Quadriceps, Isometrics  Lie on your back with your right / left leg extended and your opposite knee  bent.  Gradually tense the muscles in the front of your right / left thigh. You should see either your knee cap slide up toward your hip or increased dimpling just above the knee. This motion will push the back of the knee down toward the floor/mat/bed on which you are lying.  Hold the muscle as tight as you can without increasing your pain for __________ seconds.  Relax the muscles slowly and completely in between each repetition. Repeat __________ times. Complete this exercise __________ times per day.  STRENGTH - Quadriceps, Short Arcs   Lie on your back. Place a __________ inch towel roll under your knee so that the knee slightly bends.  Raise only your lower leg by tightening the muscles in the front of your thigh. Do not allow your thigh to rise.  Hold this position for __________ seconds. Repeat __________ times. Complete this exercise __________ times per day.  OPTIONAL ANKLE WEIGHTS: Begin with ____________________, but DO NOT exceed ____________________. Increase in 1 pound/0.5 kilogram increments.  STRENGTH - Quadriceps, Straight Leg Raises  Quality counts! Watch for signs that the quadriceps muscle is working to insure you are strengthening the correct muscles and not "cheating" by substituting with healthier muscles.  Lay on your back with your right / left leg extended and your opposite knee bent.  Tense the muscles in the front of your right / left thigh. You should see either your knee cap slide up or increased dimpling just above the knee. Your thigh may even quiver.  Tighten these muscles even more and raise your leg 4 to 6 inches off the floor. Hold for __________ seconds.  Keeping these muscles tense, lower your leg.  Relax the muscles slowly and completely in between each repetition. Repeat __________ times. Complete this exercise __________ times per day.  STRENGTH - Hamstring, Curls  Lay on your stomach with your legs extended. (If you lay on a bed, your feet  may hang over the edge.)  Tighten the muscles in the back of your thigh to bend your right / left knee up to 90 degrees. Keep your hips flat on the bed/floor.  Hold this position for __________ seconds.  Slowly lower your leg back to the starting position. Repeat __________ times. Complete this exercise __________ times per day.  OPTIONAL ANKLE WEIGHTS: Begin with ____________________, but DO NOT exceed ____________________. Increase in 1 pound/0.5 kilogram increments.  STRENGTH - Quadriceps, Squats  Stand in a door frame so that your feet and knees are in line with the frame.  Use your hands for balance, not support, on the frame.  Slowly lower your weight, bending at the hips and knees. Keep your lower legs upright so that they are parallel with the door frame. Squat only within the range that does not increase your knee pain. Never let your hips drop below your knees.  Slowly return upright, pushing with your legs, not pulling with your hands. Repeat __________ times. Complete this exercise __________ times per day.  STRENGTH - Quadriceps, Wall Slides  Follow guidelines for form closely. Increased knee pain often results from poorly placed feet or knees.  [employer]  against a smooth wall or door and walk your feet out 18-24 inches. Place your feet hip-width apart.  Slowly slide down the wall or door until your knees bend __________ degrees.* Keep your knees over your heels, not your toes, and in line with your hips, not falling to either side.  Hold for __________ seconds. Stand up to rest for __________ seconds in between each repetition. Repeat __________ times. Complete this exercise __________ times per day. * Your physician, physical therapist, or athletic trainer will alter this angle based on your symptoms and progress. Document Released: 01/29/2005 Document Revised: 08/01/2013 Document Reviewed: 06/29/2008 [facility] Patient Information 2015 [place], [place]. This information is not  intended to replace advice given to you by your health care provider. Make sure you discuss any questions you have with your health care provider.

## 2013-11-14 NOTE — Progress Notes (Signed)
   Subjective:    Patient ID: Jennifer Erickson, female    DOB: 02-27-1971, 43 y.o.   MRN: [id number]  HPI    Review of Systems     Objective:   Physical Exam        Assessment & Plan:

## 2013-11-25 ENCOUNTER — Ambulatory Visit: Payer: Federal, State, Local not specified - PPO | Admitting: Physician Assistant

## 2013-11-25 ENCOUNTER — Telehealth: Payer: Self-pay | Admitting: Physician Assistant

## 2013-11-25 DIAGNOSIS — Z0289 Encounter for other administrative examinations: Secondary | ICD-10-CM

## 2013-11-25 NOTE — Telephone Encounter (Signed)
Call pt: follow up if symptoms persist. Pt missed appt today.

## 2013-12-07 ENCOUNTER — Ambulatory Visit (INDEPENDENT_AMBULATORY_CARE_PROVIDER_SITE_OTHER): Payer: Federal, State, Local not specified - PPO | Admitting: Physician Assistant

## 2013-12-07 ENCOUNTER — Encounter: Payer: Self-pay | Admitting: Physician Assistant

## 2013-12-07 VITALS — BP 121/74 | HR 83 | Temp 98.2°F | Ht 66.0 in | Wt 275.0 lb

## 2013-12-07 DIAGNOSIS — R300 Dysuria: Secondary | ICD-10-CM

## 2013-12-07 DIAGNOSIS — R350 Frequency of micturition: Secondary | ICD-10-CM | POA: Diagnosis not present

## 2013-12-07 DIAGNOSIS — R109 Unspecified abdominal pain: Secondary | ICD-10-CM

## 2013-12-07 DIAGNOSIS — N3000 Acute cystitis without hematuria: Secondary | ICD-10-CM | POA: Diagnosis not present

## 2013-12-07 DIAGNOSIS — N3001 Acute cystitis with hematuria: Secondary | ICD-10-CM

## 2013-12-07 LAB — POCT URINALYSIS DIPSTICK
Bilirubin, UA: NEGATIVE
Glucose, UA: NEGATIVE
Ketones, UA: NEGATIVE
NITRITE UA: NEGATIVE
PROTEIN UA: NEGATIVE
Spec Grav, UA: 1.015
Urobilinogen, UA: 0.2
pH, UA: 5.5

## 2013-12-07 MED ORDER — FLUCONAZOLE 150 MG PO TABS: 150.0000 mg | ORAL_TABLET | Freq: Once | ORAL | Status: DC

## 2013-12-07 MED ORDER — CIPROFLOXACIN HCL 500 MG PO TABS: 500.0000 mg | ORAL_TABLET | Freq: Two times a day (BID) | ORAL | Status: DC

## 2013-12-07 NOTE — Patient Instructions (Signed)

## 2013-12-07 NOTE — Progress Notes (Signed)
   Subjective:    Patient ID: Jennifer Erickson, female    DOB: March 06, 1971, 43 y.o.   MRN: [id number]  HPI  Pt is a 43 yo female who presents to the clinic with 2 weeks of increased urinary frequency, urgency and dysuria. She treated with AZO for 3-5 days but symptoms have not resolved and now she has right flank pain. No fever, chills, nausea, vomiting.   Review of Systems  All other systems reviewed and are negative.      Objective:   Physical Exam  Constitutional: She is oriented to person, place, and time. She appears well-developed and well-nourished.  HENT:  Head: Normocephalic and atraumatic.  Cardiovascular: Normal rate and normal heart sounds.   Pulmonary/Chest: Effort normal and breath sounds normal.  No CVA tenderness.   Abdominal: Soft. Bowel sounds are normal. She exhibits no distension and no mass. There is no rebound and no guarding.  Mild tenderness over lower abdomen.   Neurological: She is alert and oriented to person, place, and time.  Psychiatric: She has a normal mood and affect. Her behavior is normal.          Assessment & Plan:  UTI- .[name] Results for orders placed in visit on 12/07/13  POCT URINALYSIS DIPSTICK      Result Value Ref Range   Color, UA yellow     Clarity, UA cloudy     Glucose, UA neg     Bilirubin, UA neg     Ketones, UA neg     Spec Grav, UA 1.015     Blood, UA trace-lysed     pH, UA 5.5     Protein, UA neg     Urobilinogen, UA 0.2     Nitrite, UA neg     Leukocytes, UA moderate (2+)     Treated with cipro and diflucan for residual yeast infection. HO given. Increase hydration. Follow up if not improving or if worsening.

## 2013-12-12 ENCOUNTER — Other Ambulatory Visit: Payer: Self-pay | Admitting: Physician Assistant

## 2013-12-12 ENCOUNTER — Telehealth: Payer: Self-pay | Admitting: *Deleted

## 2013-12-12 MED ORDER — NITROFURANTOIN MONOHYD MACRO 100 MG PO CAPS: 100.0000 mg | ORAL_CAPSULE | Freq: Two times a day (BID) | ORAL | Status: DC

## 2013-12-12 NOTE — Telephone Encounter (Signed)
Stop cipro. Start marcobid that was sent to pharmacy.

## 2013-12-12 NOTE — Telephone Encounter (Signed)
Having an allergic reaction to Cipro itching and nausea accompanied with vomiting since starting medication. She said that she is not feeling much better either since starting medication. Please advise. Margette Fast, CMA

## 2013-12-12 NOTE — Telephone Encounter (Signed)
Patient aware. Jennifer Erickson, CMA

## 2013-12-28 ENCOUNTER — Encounter: Payer: Federal, State, Local not specified - PPO | Admitting: Physician Assistant

## 2014-01-23 ENCOUNTER — Ambulatory Visit (INDEPENDENT_AMBULATORY_CARE_PROVIDER_SITE_OTHER): Payer: Federal, State, Local not specified - PPO

## 2014-01-23 ENCOUNTER — Encounter: Payer: Self-pay | Admitting: Physician Assistant

## 2014-01-23 ENCOUNTER — Ambulatory Visit (INDEPENDENT_AMBULATORY_CARE_PROVIDER_SITE_OTHER): Payer: Federal, State, Local not specified - PPO | Admitting: Physician Assistant

## 2014-01-23 VITALS — BP 132/88 | HR 87 | Wt 280.0 lb

## 2014-01-23 DIAGNOSIS — M545 Low back pain, unspecified: Secondary | ICD-10-CM

## 2014-01-23 DIAGNOSIS — N200 Calculus of kidney: Secondary | ICD-10-CM

## 2014-01-23 MED ORDER — MELOXICAM 15 MG PO TABS: 15.0000 mg | ORAL_TABLET | Freq: Every day | ORAL | Status: DC

## 2014-01-23 MED ORDER — TRAMADOL HCL 50 MG PO TABS: 50.0000 mg | ORAL_TABLET | Freq: Three times a day (TID) | ORAL | Status: DC | PRN

## 2014-01-23 MED ORDER — CYCLOBENZAPRINE HCL 10 MG PO TABS: 10.0000 mg | ORAL_TABLET | Freq: Three times a day (TID) | ORAL | Status: DC | PRN

## 2014-01-23 MED ORDER — PREDNISONE 50 MG PO TABS: ORAL_TABLET | ORAL | Status: DC

## 2014-01-23 MED ORDER — KETOROLAC TROMETHAMINE 60 MG/2ML IM SOLN: 60.0000 mg | Freq: Once | INTRAMUSCULAR | Status: DC

## 2014-01-23 NOTE — Patient Instructions (Signed)
Low Back Sprain with Rehab  A sprain is an injury in which a ligament is torn. The ligaments of the lower back are vulnerable to sprains. However, they are strong and require great force to be injured. These ligaments are important for stabilizing the spinal column. Sprains are classified into three categories. Grade 1 sprains cause pain, but the tendon is not lengthened. Grade 2 sprains include a lengthened ligament, due to the ligament being stretched or partially ruptured. With grade 2 sprains there is still function, although the function may be decreased. Grade 3 sprains involve a complete tear of the tendon or muscle, and function is usually impaired. SYMPTOMS   Severe pain in the lower back.  Sometimes, a feeling of a "pop," "snap," or tear, at the time of injury.  Tenderness and sometimes swelling at the injury site.  Uncommonly, bruising (contusion) within 48 hours of injury.  Muscle spasms in the back. CAUSES  Low back sprains occur when a force is placed on the ligaments that is greater than they can handle. Common causes of injury include:  Performing a stressful act while off-balance.  Repetitive stressful activities that involve movement of the lower back.  Direct hit (trauma) to the lower back. RISK INCREASES WITH:  Contact sports (football, wrestling).  Collisions (major skiing accidents).  Sports that require throwing or lifting (baseball, weightlifting).  Sports involving twisting of the spine (gymnastics, diving, tennis, golf).  Poor strength and flexibility.  Inadequate protection.  Previous back injury or surgery (especially fusion). PREVENTION  Wear properly fitted and padded protective equipment.  Warm up and stretch properly before activity.  Allow for adequate recovery between workouts.  Maintain physical fitness:  Strength, flexibility, and endurance.  Cardiovascular fitness.  Maintain a healthy body weight. PROGNOSIS  If treated  properly, low back sprains usually heal with non-surgical treatment. The length of time for healing depends on the severity of the injury.  RELATED COMPLICATIONS   Recurring symptoms, resulting in a chronic problem.  Chronic inflammation and pain in the low back.  Delayed healing or resolution of symptoms, especially if activity is resumed too soon.  Prolonged impairment.  Unstable or arthritic joints of the low back. TREATMENT  Treatment first involves the use of ice and medicine, to reduce pain and inflammation. The use of strengthening and stretching exercises may help reduce pain with activity. These exercises may be performed at home or with a therapist. Severe injuries may require referral to a therapist for further evaluation and treatment, such as ultrasound. Your caregiver may advise that you wear a back brace or corset, to help reduce pain and discomfort. Often, prolonged bed rest results in greater harm then benefit. Corticosteroid injections may be recommended. However, these should be reserved for the most serious cases. It is important to avoid using your back when lifting objects. At night, sleep on your back on a firm mattress, with a pillow placed under your knees. If non-surgical treatment is unsuccessful, surgery may be needed.  MEDICATION   If pain medicine is needed, nonsteroidal anti-inflammatory medicines (aspirin and ibuprofen), or other minor pain relievers (acetaminophen), are often advised.  Do not take pain medicine for 7 days before surgery.  Prescription pain relievers may be given, if your caregiver thinks they are needed. Use only as directed and only as much as you need.  Ointments applied to the skin may be helpful.  Corticosteroid injections may be given by your caregiver. These injections should be reserved for the most serious cases,  because they may only be given a certain number of times. HEAT AND COLD  Cold treatment (icing) should be applied for 10  to 15 minutes every 2 to 3 hours for inflammation and pain, and immediately after activity that aggravates your symptoms. Use ice packs or an ice massage.  Heat treatment may be used before performing stretching and strengthening activities prescribed by your caregiver, physical therapist, or athletic trainer. Use a heat pack or a warm water soak. SEEK MEDICAL CARE IF:   Symptoms get worse or do not improve in 2 to 4 weeks, despite treatment.  You develop numbness or weakness in either leg.  You lose bowel or bladder function.  Any of the following occur after surgery: fever, increased pain, swelling, redness, drainage of fluids, or bleeding in the affected area.  New, unexplained symptoms develop. (Drugs used in treatment may produce side effects.) EXERCISES  RANGE OF MOTION (ROM) AND STRETCHING EXERCISES - Low Back Sprain Most people with lower back pain will find that their symptoms get worse with excessive bending forward (flexion) or arching at the lower back (extension). The exercises that will help resolve your symptoms will focus on the opposite motion.  Your physician, physical therapist or athletic trainer will help you determine which exercises will be most helpful to resolve your lower back pain. Do not complete any exercises without first consulting with your caregiver. Discontinue any exercises which make your symptoms worse, until you speak to your caregiver. If you have pain, numbness or tingling which travels down into your buttocks, leg or foot, the goal of the therapy is for these symptoms to move closer to your back and eventually resolve. Sometimes, these leg symptoms will get better, but your lower back pain may worsen. This is often an indication of progress in your rehabilitation. Be very alert to any changes in your symptoms and the activities in which you participated in the 24 hours prior to the change. Sharing this information with your caregiver will allow him or her to  most efficiently treat your condition. These exercises may help you when beginning to rehabilitate your injury. Your symptoms may resolve with or without further involvement from your physician, physical therapist or athletic trainer. While completing these exercises, remember:   Restoring tissue flexibility helps normal motion to return to the joints. This allows healthier, less painful movement and activity.  An effective stretch should be held for at least 30 seconds.  A stretch should never be painful. You should only feel a gentle lengthening or release in the stretched tissue. FLEXION RANGE OF MOTION AND STRETCHING EXERCISES: STRETCH - Flexion, Single Knee to Chest   Lie on a firm bed or floor with both legs extended in front of you.  Keeping one leg in contact with the floor, bring your opposite knee to your chest. Hold your leg in place by either grabbing behind your thigh or at your knee.  Pull until you feel a gentle stretch in your low back. Hold __________ seconds.  Slowly release your grasp and repeat the exercise with the opposite side. Repeat __________ times. Complete this exercise __________ times per day.  STRETCH - Flexion, Double Knee to Chest  Lie on a firm bed or floor with both legs extended in front of you.  Keeping one leg in contact with the floor, bring your opposite knee to your chest.  Tense your stomach muscles to support your back and then lift your other knee to your chest. Hold your legs  in place by either grabbing behind your thighs or at your knees.  Pull both knees toward your chest until you feel a gentle stretch in your low back. Hold __________ seconds.  Tense your stomach muscles and slowly return one leg at a time to the floor. Repeat __________ times. Complete this exercise __________ times per day.  STRETCH - Low Trunk Rotation  Lie on a firm bed or floor. Keeping your legs in front of you, bend your knees so they are both pointed toward the  ceiling and your feet are flat on the floor.  Extend your arms out to the side. This will stabilize your upper body by keeping your shoulders in contact with the floor.  Gently and slowly drop both knees together to one side until you feel a gentle stretch in your low back. Hold for __________ seconds.  Tense your stomach muscles to support your lower back as you bring your knees back to the starting position. Repeat the exercise to the other side. Repeat __________ times. Complete this exercise __________ times per day  EXTENSION RANGE OF MOTION AND FLEXIBILITY EXERCISES: STRETCH - Extension, Prone on Elbows   Lie on your stomach on the floor, a bed will be too soft. Place your palms about shoulder width apart and at the height of your head.  Place your elbows under your shoulders. If this is too painful, stack pillows under your chest.  Allow your body to relax so that your hips drop lower and make contact more completely with the floor.  Hold this position for __________ seconds.  Slowly return to lying flat on the floor. Repeat __________ times. Complete this exercise __________ times per day.  RANGE OF MOTION - Extension, Prone Press Ups  Lie on your stomach on the floor, a bed will be too soft. Place your palms about shoulder width apart and at the height of your head.  Keeping your back as relaxed as possible, slowly straighten your elbows while keeping your hips on the floor. You may adjust the placement of your hands to maximize your comfort. As you gain motion, your hands will come more underneath your shoulders.  Hold this position __________ seconds.  Slowly return to lying flat on the floor. Repeat __________ times. Complete this exercise __________ times per day.  RANGE OF MOTION- Quadruped, Neutral Spine   Assume a hands and knees position on a firm surface. Keep your hands under your shoulders and your knees under your hips. You may place padding under your knees for  comfort.  Drop your head and point your tailbone toward the ground below you. This will round out your lower back like an angry cat. Hold this position for __________ seconds.  Slowly lift your head and release your tail bone so that your back sags into a large arch, like an old horse.  Hold this position for __________ seconds.  Repeat this until you feel limber in your low back.  Now, find your "sweet spot." This will be the most comfortable position somewhere between the two previous positions. This is your neutral spine. Once you have found this position, tense your stomach muscles to support your low back.  Hold this position for __________ seconds. Repeat __________ times. Complete this exercise __________ times per day.  STRENGTHENING EXERCISES - Low Back Sprain These exercises may help you when beginning to rehabilitate your injury. These exercises should be done near your "sweet spot." This is the neutral, low-back arch, somewhere between fully rounded  and fully arched, that is your least painful position. When performed in this safe range of motion, these exercises can be used for people who have either a flexion or extension based injury. These exercises may resolve your symptoms with or without further involvement from your physician, physical therapist or athletic trainer. While completing these exercises, remember:   Muscles can gain both the endurance and the strength needed for everyday activities through controlled exercises.  Complete these exercises as instructed by your physician, physical therapist or athletic trainer. Increase the resistance and repetitions only as guided.  You may experience muscle soreness or fatigue, but the pain or discomfort you are trying to eliminate should never worsen during these exercises. If this pain does worsen, stop and make certain you are following the directions exactly. If the pain is still present after adjustments, discontinue the  exercise until you can discuss the trouble with your caregiver. STRENGTHENING - Deep Abdominals, Pelvic Tilt   Lie on a firm bed or floor. Keeping your legs in front of you, bend your knees so they are both pointed toward the ceiling and your feet are flat on the floor.  Tense your lower abdominal muscles to press your low back into the floor. This motion will rotate your pelvis so that your tail bone is scooping upwards rather than pointing at your feet or into the floor. With a gentle tension and even breathing, hold this position for __________ seconds. Repeat __________ times. Complete this exercise __________ times per day.  STRENGTHENING - Abdominals, Crunches   Lie on a firm bed or floor. Keeping your legs in front of you, bend your knees so they are both pointed toward the ceiling and your feet are flat on the floor. Cross your arms over your chest.  Slightly tip your chin down without bending your neck.  Tense your abdominals and slowly lift your trunk high enough to just clear your shoulder blades. Lifting higher can put excessive stress on the lower back and does not further strengthen your abdominal muscles.  Control your return to the starting position. Repeat __________ times. Complete this exercise __________ times per day.  STRENGTHENING - Quadruped, Opposite UE/LE Lift   Assume a hands and knees position on a firm surface. Keep your hands under your shoulders and your knees under your hips. You may place padding under your knees for comfort.  Find your neutral spine and gently tense your abdominal muscles so that you can maintain this position. Your shoulders and hips should form a rectangle that is parallel with the floor and is not twisted.  Keeping your trunk steady, lift your right hand no higher than your shoulder and then your left leg no higher than your hip. Make sure you are not holding your breath. Hold this position for __________ seconds.  Continuing to keep  your abdominal muscles tense and your back steady, slowly return to your starting position. Repeat with the opposite arm and leg. Repeat __________ times. Complete this exercise __________ times per day.  STRENGTHENING - Abdominals and Quadriceps, Straight Leg Raise   Lie on a firm bed or floor with both legs extended in front of you.  Keeping one leg in contact with the floor, bend the other knee so that your foot can rest flat on the floor.  Find your neutral spine, and tense your abdominal muscles to maintain your spinal position throughout the exercise.  Slowly lift your straight leg off the floor about 6 inches for a count  of 15, making sure to not hold your breath.  Still keeping your neutral spine, slowly lower your leg all the way to the floor. Repeat this exercise with each leg __________ times. Complete this exercise __________ times per day. POSTURE AND BODY MECHANICS CONSIDERATIONS - Low Back Sprain Keeping correct posture when sitting, standing or completing your activities will reduce the stress put on different body tissues, allowing injured tissues a chance to heal and limiting painful experiences. The following are general guidelines for improved posture. Your physician or physical therapist will provide you with any instructions specific to your needs. While reading these guidelines, remember:  The exercises prescribed by your provider will help you have the flexibility and strength to maintain correct postures.  The correct posture provides the best environment for your joints to work. All of your joints have less wear and tear when properly supported by a spine with good posture. This means you will experience a healthier, less painful body.  Correct posture must be practiced with all of your activities, especially prolonged sitting and standing. Correct posture is as important when doing repetitive low-stress activities (typing) as it is when doing a single heavy-load  activity (lifting). RESTING POSITIONS Consider which positions are most painful for you when choosing a resting position. If you have pain with flexion-based activities (sitting, bending, stooping, squatting), choose a position that allows you to rest in a less flexed posture. You would want to avoid curling into a fetal position on your side. If your pain worsens with extension-based activities (prolonged standing, working overhead), avoid resting in an extended position such as sleeping on your stomach. Most people will find more comfort when they rest with their spine in a more neutral position, neither too rounded nor too arched. Lying on a non-sagging bed on your side with a pillow between your knees, or on your back with a pillow under your knees will often provide some relief. Keep in mind, being in any one position for a prolonged period of time, no matter how correct your posture, can still lead to stiffness. PROPER SITTING POSTURE In order to minimize stress and discomfort on your spine, you must sit with correct posture. Sitting with good posture should be effortless for a healthy body. Returning to good posture is a gradual process. Many people can work toward this most comfortably by using various supports until they have the flexibility and strength to maintain this posture on their own. When sitting with proper posture, your ears will fall over your shoulders and your shoulders will fall over your hips. You should use the back of the chair to support your upper back. Your lower back will be in a neutral position, just slightly arched. You may place a small pillow or folded towel at the base of your lower back for  support.  When working at a desk, create an environment that supports good, upright posture. Without extra support, muscles tire, which leads to excessive strain on joints and other tissues. Keep these recommendations in mind: CHAIR:  A chair should be able to slide under your desk  when your back makes contact with the back of the chair. This allows you to work closely.  The chair's height should allow your eyes to be level with the upper part of your monitor and your hands to be slightly lower than your elbows. BODY POSITION  Your feet should make contact with the floor. If this is not possible, use a foot rest.  Keep your  ears over your shoulders. This will reduce stress on your neck and low back. INCORRECT SITTING POSTURES  If you are feeling tired and unable to assume a healthy sitting posture, do not slouch or slump. This puts excessive strain on your back tissues, causing more damage and pain. Healthier options include:  Using more support, like a lumbar pillow.  Switching tasks to something that requires you to be upright or walking.  Talking a brief walk.  Lying down to rest in a neutral-spine position. PROLONGED STANDING WHILE SLIGHTLY LEANING FORWARD  When completing a task that requires you to lean forward while standing in one place for a long time, place either foot up on a stationary 2-4 inch high object to help maintain the best posture. When both feet are on the ground, the lower back tends to lose its slight inward curve. If this curve flattens (or becomes too large), then the back and your other joints will experience too much stress, tire more quickly, and can cause pain. CORRECT STANDING POSTURES Proper standing posture should be assumed with all daily activities, even if they only take a few moments, like when brushing your teeth. As in sitting, your ears should fall over your shoulders and your shoulders should fall over your hips. You should keep a slight tension in your abdominal muscles to brace your spine. Your tailbone should point down to the ground, not behind your body, resulting in an over-extended swayback posture.  INCORRECT STANDING POSTURES  Common incorrect standing postures include a forward head, locked knees and/or an excessive  swayback. WALKING Walk with an upright posture. Your ears, shoulders and hips should all line-up. PROLONGED ACTIVITY IN A FLEXED POSITION When completing a task that requires you to bend forward at your waist or lean over a low surface, try to find a way to stabilize 3 out of 4 of your limbs. You can place a hand or elbow on your thigh or rest a knee on the surface you are reaching across. This will provide you more stability, so that your muscles do not tire as quickly. By keeping your knees relaxed, or slightly bent, you will also reduce stress across your lower back. CORRECT LIFTING TECHNIQUES DO :  Assume a wide stance. This will provide you more stability and the opportunity to get as close as possible to the object which you are lifting.  Tense your abdominals to brace your spine. Bend at the knees and hips. Keeping your back locked in a neutral-spine position, lift using your leg muscles. Lift with your legs, keeping your back straight.  Test the weight of unknown objects before attempting to lift them.  Try to keep your elbows locked down at your sides in order get the best strength from your shoulders when carrying an object.  Always ask for help when lifting heavy or awkward objects. INCORRECT LIFTING TECHNIQUES DO NOT:   Lock your knees when lifting, even if it is a small object.  Bend and twist. Pivot at your feet or move your feet when needing to change directions.  Assume that you can safely pick up even a paperclip without proper posture. Document Released: 03/17/2005 Document Revised: 06/09/2011 Document Reviewed: 06/29/2008 [facility] Patient Information 2015 [place], [place]. This information is not intended to replace advice given to you by your health care provider. Make sure you discuss any questions you have with your health care provider.

## 2014-01-23 NOTE — Progress Notes (Signed)
   Subjective:    Patient ID: Jennifer Erickson, female    DOB: 1970-11-02, 43 y.o.   MRN: [id number]  HPI Pt presents to the clinic with LBP for last week and half. Started around the 16th went she slept on cough all night. When she woke up she noticed her back was stiff. It has gotten progressevely worse. Today 10/10 pain. Denies any trauma or injury. Hx of LBP that resolved with heat and rest. Denies any saddle anesthesia and/or bladder or bowel dysfunction. Taking 674m of ibuprofen at least twice a day with no benefit. This morning needed help standing and getting to the toilet.  .   Review of Systems  All other systems reviewed and are negative.      Objective:   Physical Exam  Constitutional: She is oriented to person, place, and time. She appears well-developed and well-nourished.  HENT:  Head: Normocephalic and atraumatic.  Cardiovascular: Normal rate, regular rhythm and normal heart sounds.   Pulmonary/Chest: Effort normal and breath sounds normal.  Musculoskeletal:  No ROM without pain.  Pt laid on exam table and pain with lifting of both legs.  No real radiation of pain but stays in low back.  Strength 5/5 bilaterally.  Patellar reflexes 2+ and symmetric.  No pain with palpation over spine.  paraspinous muscle tight bilaterally low back.   Neurological: She is alert and oriented to person, place, and time.  Skin: Skin is dry.  Psychiatric: She has a normal mood and affect. Her behavior is normal.          Assessment & Plan:  Bilateral low back pain without sciatic- suspect some SI dysfunction as well. Will get lumbar xrays today. toradol [id number] IM in office today. Prednisone burst. mobic to replace advil to take daily. Flexeril up to three times a day. Sedation warning given. execise for low back and SI joint given to start. Encouraged ice for first 2 days then transition to heat. Tramadol for acute pain. Written out of work through Thursday. Follow up  Wednesday for recheck. Paperwork filled out for husband to stay at home with patient. She currently is needed help to ambulate and maneuver around house.

## 2014-01-25 ENCOUNTER — Ambulatory Visit (INDEPENDENT_AMBULATORY_CARE_PROVIDER_SITE_OTHER): Payer: Federal, State, Local not specified - PPO | Admitting: Physician Assistant

## 2014-01-25 ENCOUNTER — Encounter: Payer: Self-pay | Admitting: Physician Assistant

## 2014-01-25 VITALS — BP 115/62 | HR 80 | Ht 66.0 in | Wt 281.0 lb

## 2014-01-25 DIAGNOSIS — M5416 Radiculopathy, lumbar region: Secondary | ICD-10-CM | POA: Insufficient documentation

## 2014-01-25 DIAGNOSIS — M545 Low back pain, unspecified: Secondary | ICD-10-CM

## 2014-01-25 NOTE — Progress Notes (Signed)
   Subjective:    Patient ID: Jennifer Erickson, female    DOB: [birth year], 43 y.o.   MRN: [id number]  HPI Patient is a 43 year old female who presents to the clinic to follow-up on low back pain. Her back pain is significantly better today. She would rate the discomfort at 2 out of 10. She is currently on prednisone, Mobic and Flexeril. Her range of motion has increased and she feels like she could go back to work. She is doing some of the stretches. She is able to ambulate without difficultly and perform activities of daily living now.   Review of Systems  All other systems reviewed and are negative.      Objective:   Physical Exam  Constitutional: She appears well-developed and well-nourished.  HENT:  Head: Normocephalic and atraumatic.  Musculoskeletal:  Pt is slow but able to have full RoM at waist with minimal discomfort.  Paraspinous muscles and palpation around SI joint bilaterally still tender.   Psychiatric: She has a normal mood and affect. Her behavior is normal.          Assessment & Plan:  Bilateral low back pain without sciatica-released to go back to work on Friday 01/27/14. Continue with low back exercises. Finish prednisone. Continue with Flexeril as needed. Continue with Mobic for the next 2 weeks then as needed.she is to bring in paperwork for being out of work this week. Sent pt home with note for work.

## 2014-04-18 ENCOUNTER — Encounter: Payer: Self-pay | Admitting: Physician Assistant

## 2014-04-18 ENCOUNTER — Ambulatory Visit (INDEPENDENT_AMBULATORY_CARE_PROVIDER_SITE_OTHER): Payer: Federal, State, Local not specified - PPO

## 2014-04-18 ENCOUNTER — Ambulatory Visit (INDEPENDENT_AMBULATORY_CARE_PROVIDER_SITE_OTHER): Payer: Federal, State, Local not specified - PPO | Admitting: Physician Assistant

## 2014-04-18 VITALS — BP 131/90 | HR 79 | Temp 98.7°F | Ht 66.0 in | Wt 276.0 lb

## 2014-04-18 DIAGNOSIS — M79645 Pain in left finger(s): Secondary | ICD-10-CM

## 2014-04-18 DIAGNOSIS — M79662 Pain in left lower leg: Secondary | ICD-10-CM | POA: Diagnosis not present

## 2014-04-18 DIAGNOSIS — S86812A Strain of other muscle(s) and tendon(s) at lower leg level, left leg, initial encounter: Secondary | ICD-10-CM

## 2014-04-18 DIAGNOSIS — R5383 Other fatigue: Secondary | ICD-10-CM

## 2014-04-18 DIAGNOSIS — Z0000 Encounter for general adult medical examination without abnormal findings: Secondary | ICD-10-CM | POA: Diagnosis not present

## 2014-04-18 DIAGNOSIS — S86112A Strain of other muscle(s) and tendon(s) of posterior muscle group at lower leg level, left leg, initial encounter: Secondary | ICD-10-CM

## 2014-04-18 DIAGNOSIS — Z131 Encounter for screening for diabetes mellitus: Secondary | ICD-10-CM

## 2014-04-18 DIAGNOSIS — Z13220 Encounter for screening for lipoid disorders: Secondary | ICD-10-CM

## 2014-04-18 MED ORDER — MELOXICAM 15 MG PO TABS: 15.0000 mg | ORAL_TABLET | Freq: Every day | ORAL | Status: DC

## 2014-04-18 NOTE — Progress Notes (Signed)
   Subjective:    Patient ID: Jennifer Erickson, female    DOB: Dec 15, 1970, 44 y.o.   MRN: [id number]  HPI  Pt comes in with some acute concerns.   Left finger ring pain at base. Since December. Not worsening just not resolving. Done nothing to make better. No injury or trauma. Getting harder to open jars. Pain not extending into hand or wrist.   Left calf pain behind knee painful for last few days. No known injury or trauma better once gets moving. Worse when resting or stretching. Not tried anything to make better. No swelling. No CP, SOB, dyspnea.  Wants labs for CPE but wants to look for things that cause fatigue. She just feels run down and tired all the time. Denies hopeless or helplessness feeling from depression.    Review of Systems  All other systems reviewed and are negative.      Objective:   Physical Exam  Constitutional: She is oriented to person, place, and time. She appears well-developed and well-nourished.  HENT:  Head: Normocephalic and atraumatic.  Cardiovascular: Normal rate, regular rhythm and normal heart sounds.   Pulmonary/Chest: Effort normal and breath sounds normal.  Musculoskeletal:  Left calf: No tenderess over actual calf.  Tenderness at insertion point of gastronemicus at posterior knee.  No warmth.  Able to bear weight.  Strength 5/5.  No pain over achilles tendon.  Flexion decrease due to pain.  Full extension.   Left ring finger No catching with flexion/extension of left metacarpal. Tenderness to palapation at base of ring left metacarpal.  No mass or nodule felt.  No bruising or swelling.   Neurological: She is alert and oriented to person, place, and time.  Skin: Skin is dry.  Psychiatric: She has a normal mood and affect. Her behavior is normal.          Assessment & Plan:  Left ring finger pain- will get xray today. Suspect some OA. No trigger point signs on exam today. Did not feel a mass or knot. mobic likely will help.  Could be some inflammation over a extensor tendon.  Ice area. May consider referral to Dr. Darene Lamer per pt she does have a hand orthopedic that she might follow up with.   Left calf pain- due to point of tenderness suspect gastrocnemius sprain. Will check a d-dimer to reassure no blood clot. i do not think so but due to pt's size a location want to be certain. mobic given. Ice area. Exercises given. REST and elevate. Follow up in 1 week at CPE.   Pt has CPE scheduled for next week labs were ordered.

## 2014-04-18 NOTE — Patient Instructions (Signed)
Ice 15 minutes three times a day.  Exercises for calf strain.

## 2014-04-19 ENCOUNTER — Other Ambulatory Visit: Payer: Self-pay | Admitting: Physician Assistant

## 2014-04-19 DIAGNOSIS — E559 Vitamin D deficiency, unspecified: Secondary | ICD-10-CM

## 2014-04-19 LAB — LIPID PANEL
CHOL/HDL RATIO: 3.2 ratio
Cholesterol: 164 mg/dL (ref 0–200)
HDL: 52 mg/dL (ref >39)
LDL Cholesterol: 98 mg/dL (ref 0–99)
Triglycerides: 69 mg/dL (ref <150)
VLDL: 14 mg/dL (ref 0–40)

## 2014-04-19 LAB — CBC WITH DIFFERENTIAL/PLATELET
Basophils Absolute: 0 10*3/uL (ref 0.0–0.1)
Basophils Relative: 0 % (ref 0–1)
Eosinophils Absolute: 0.1 10*3/uL (ref 0.0–0.7)
Eosinophils Relative: 1 % (ref 0–5)
HCT: 44.2 % (ref 36.0–46.0)
HEMOGLOBIN: 14.9 g/dL (ref 12.0–15.0)
LYMPHS ABS: 3.7 10*3/uL (ref 0.7–4.0)
LYMPHS PCT: 46 % (ref 12–46)
MCH: 30 pg (ref 26.0–34.0)
MCHC: 33.7 g/dL (ref 30.0–36.0)
MCV: 88.9 fL (ref 78.0–100.0)
MONO ABS: 0.6 10*3/uL (ref 0.1–1.0)
MPV: 9.8 fL (ref 8.6–12.4)
Monocytes Relative: 8 % (ref 3–12)
Neutro Abs: 3.6 10*3/uL (ref 1.7–7.7)
Neutrophils Relative %: 45 % (ref 43–77)
Platelets: 330 10*3/uL (ref 150–400)
RBC: 4.97 MIL/uL (ref 3.87–5.11)
RDW: 13.6 % (ref 11.5–15.5)
WBC: 8.1 10*3/uL (ref 4.0–10.5)

## 2014-04-19 LAB — COMPLETE METABOLIC PANEL WITH GFR
ALBUMIN: 3.9 g/dL (ref 3.5–5.2)
ALK PHOS: 119 U/L — AB (ref 39–117)
ALT: 29 U/L (ref 0–35)
AST: 17 U/L (ref 0–37)
BUN: 11 mg/dL (ref 6–23)
CO2: 23 mEq/L (ref 19–32)
Calcium: 9.1 mg/dL (ref 8.4–10.5)
Chloride: 104 mEq/L (ref 96–112)
Creat: 0.82 mg/dL (ref 0.50–1.10)
GFR, EST NON AFRICAN AMERICAN: 88 mL/min
GFR, Est African American: >89 mL/min
GLUCOSE: 78 mg/dL (ref 70–99)
Potassium: 4.2 mEq/L (ref 3.5–5.3)
Sodium: 139 mEq/L (ref 135–145)
Total Bilirubin: 0.4 mg/dL (ref 0.2–1.2)
Total Protein: 7.9 g/dL (ref 6.0–8.3)

## 2014-04-19 LAB — D-DIMER, QUANTITATIVE: D-Dimer, Quant: 0.32 ug/mL-FEU (ref 0.00–0.48)

## 2014-04-19 LAB — FERRITIN: FERRITIN: 305 ng/mL — AB (ref 10–291)

## 2014-04-19 LAB — TSH: TSH: 1.718 u[IU]/mL (ref 0.350–4.500)

## 2014-04-19 LAB — VITAMIN B12: Vitamin B-12: 615 pg/mL (ref 211–911)

## 2014-04-19 LAB — VITAMIN D 25 HYDROXY (VIT D DEFICIENCY, FRACTURES): VIT D 25 HYDROXY: 7 ng/mL — AB (ref 30–100)

## 2014-04-19 LAB — T4, FREE: FREE T4: 0.93 ng/dL (ref 0.80–1.80)

## 2014-04-19 MED ORDER — VITAMIN D (ERGOCALCIFEROL) 1.25 MG (50000 UNIT) PO CAPS
50000.0000 [IU] | ORAL_CAPSULE | ORAL | Status: DC
Start: 2014-04-19 — End: 2014-11-01

## 2014-04-25 ENCOUNTER — Encounter: Payer: Self-pay | Admitting: Physician Assistant

## 2014-04-25 ENCOUNTER — Ambulatory Visit (INDEPENDENT_AMBULATORY_CARE_PROVIDER_SITE_OTHER): Payer: Federal, State, Local not specified - PPO | Admitting: Physician Assistant

## 2014-04-25 VITALS — BP 120/78 | HR 93 | Ht 66.0 in | Wt 277.0 lb

## 2014-04-25 DIAGNOSIS — M545 Low back pain, unspecified: Secondary | ICD-10-CM

## 2014-04-25 DIAGNOSIS — N62 Hypertrophy of breast: Secondary | ICD-10-CM

## 2014-04-25 DIAGNOSIS — Z0000 Encounter for general adult medical examination without abnormal findings: Secondary | ICD-10-CM | POA: Diagnosis not present

## 2014-04-25 NOTE — Progress Notes (Signed)
Subjective:    Patient ID: Jennifer Erickson, female    DOB: 1971/01/26, 44 y.o.   MRN: [id number]  HPI  Review of Systems     Objective:   Physical Exam        Assessment & Plan:   Subjective:     Jennifer Erickson is a 44 y.o. female and is here for a comprehensive physical exam. The patient reports problems - patient would like to discuss ongoing fatigue. This is been ongoing for the past 6 months to a year. She denies any feelings of hopelessness or helplessness. She really does not think she has any depression. She is on a CPAP for sleep apnea. She wakes up feeling rested. She denies any shortness of breath or wheezing.  She reports to be getting 610 7 hours of sleep at bedtime.  History   Social History  . Marital Status: Married    Spouse Name: N/A    Number of Children: N/A  . Years of Education: N/A   Occupational History  . Not on file.   Social History Main Topics  . Smoking status: Never Smoker   . Smokeless tobacco: Never Used  . Alcohol Use: No  . Drug Use: No  . Sexual Activity: Yes    Birth Control/ Protection: Pill   Other Topics Concern  . Not on file   Social History Narrative   Health Maintenance  Topic Date Due  . MAMMOGRAM  11/01/2013  . INFLUENZA VACCINE  10/24/2014 (Originally 10/29/2013)  . PAP SMEAR  06/08/2014  . TETANUS/TDAP  09/08/2018    The following portions of the patient's history were reviewed and updated as appropriate: allergies, current medications, past family history, past medical history, past social history, past surgical history and problem list.  Review of Systems A comprehensive review of systems was negative.   Objective:    BP 120/78 mmHg  Pulse 93  Ht 5' 6"  (1.676 m)  Wt 277 lb (125.646 kg)  BMI 44.73 kg/m2 General appearance: alert, cooperative and morbidly obese Head: Normocephalic, without obvious abnormality, atraumatic Eyes: conjunctivae/corneas clear. PERRL, EOM's intact. Fundi  benign. Ears: normal TM's and external ear canals both ears Nose: Nares normal. Septum midline. Mucosa normal. No drainage or sinus tenderness. Throat: lips, mucosa, and tongue normal; teeth and gums normal Neck: no adenopathy, no carotid bruit, no JVD, supple, symmetrical, trachea midline and thyroid not enlarged, symmetric, no tenderness/mass/nodules Back: symmetric, no curvature. ROM normal. No CVA tenderness. Lungs: clear to auscultation bilaterally Breasts: Breasts exam was not performed today. Patient is scheduled for mammogram in the next couple months. She declined exam today. I would like to note large breast size bilaterally. She reports she is a 44 J cup. shoulder knotching present.  Heart: regular rate and rhythm, S1, S2 normal, no murmur, click, rub or gallop Abdomen: soft, non-tender; bowel sounds normal; no masses,  no organomegaly Extremities: extremities normal, atraumatic, no cyanosis or edema Pulses: 2+ and symmetric Skin: Skin color, texture, turgor normal. No rashes or lesions Lymph nodes: Cervical, supraclavicular, and axillary nodes normal. Neurologic: Grossly normal    Assessment:    Healthy female exam.      Plan:    CPE- fasting labs were done. Cholesterol looks amazing. Negative for diabetes. Will treat vitamin D deficiency. Encouraged calcium 1200 mg along with vitamin D. See below discussion on weight and fatigue.  Fatigue- depression screening 0/2. Patient is on CPAP does not report not feeling rested in the morning when  she awakens. All of her labs checked out except vitamin D deficiency. Patient is going to start vitamin D 50,000 units for the next 3 months and will recheck level. Certainly feel like some of her fatigue is coming from her obesity. See below discussion.  Morbid obesity- we had a long discussion about how obesity can affect energy level and fatigue. Discussed there are medications to help with weight loss such as contrary for long-term or  phentermine for short-term. She declines medication treatment today. She would like to try 6 weeks on her own and see what she can do. Discussed the 1500-calorie diet daily. Discussed at least 150 minutes of exercise a week. Encouraged patient to not drink calories and eliminate sugar. Follow-up in 6 weeks.  Large breast/bilateral back pain without sciatica- I would like to make a referral to see a plastic surgeon for breast reduction. I think this would jump start her weight loss as well as decrease some back problems. See After Visit Summary for Counseling Recommendations

## 2014-04-25 NOTE — Patient Instructions (Addendum)
Fatigue Fatigue is a feeling of tiredness, lack of energy, lack of motivation, or feeling tired all the time. Having enough rest, good nutrition, and reducing stress will normally reduce fatigue. Consult your caregiver if it persists. The nature of your fatigue will help your caregiver to find out its cause. The treatment is based on the cause.  CAUSES  There are many causes for fatigue. Most of the time, fatigue can be traced to one or more of your habits or routines. Most causes fit into one or more of three general areas. They are: Lifestyle problems  Sleep disturbances.  Overwork.  Physical exertion.  Unhealthy habits.  Poor eating habits or eating disorders.  Alcohol and/or drug use .  Lack of proper nutrition (malnutrition). Psychological problems  Stress and/or anxiety problems.  Depression.  Grief.  Boredom. Medical Problems or Conditions  Anemia.  Pregnancy.  Thyroid gland problems.  Recovery from major surgery.  Continuous pain.  Emphysema or asthma that is not well controlled  Allergic conditions.  Diabetes.  Infections (such as mononucleosis).  Obesity.  Sleep disorders, such as sleep apnea.  Heart failure or other heart-related problems.  Cancer.  Kidney disease.  Liver disease.  Effects of certain medicines such as antihistamines, cough and cold remedies, prescription pain medicines, heart and blood pressure medicines, drugs used for treatment of cancer, and some antidepressants. SYMPTOMS  The symptoms of fatigue include:   Lack of energy.  Lack of drive (motivation).  Drowsiness.  Feeling of indifference to the surroundings. DIAGNOSIS  The details of how you feel help guide your caregiver in finding out what is causing the fatigue. You will be asked about your present and past health condition. It is important to review all medicines that you take, including prescription and non-prescription items. A thorough exam will be done.  You will be questioned about your feelings, habits, and normal lifestyle. Your caregiver may suggest blood tests, urine tests, or other tests to look for common medical causes of fatigue.  TREATMENT  Fatigue is treated by correcting the underlying cause. For example, if you have continuous pain or depression, treating these causes will improve how you feel. Similarly, adjusting the dose of certain medicines will help in reducing fatigue.  HOME CARE INSTRUCTIONS   Try to get the required amount of good sleep every night.  Eat a healthy and nutritious diet, and drink enough water throughout the day.  Practice ways of relaxing (including yoga or meditation).  Exercise regularly.  Make plans to change situations that cause stress. Act on those plans so that stresses decrease over time. Keep your work and personal routine reasonable.  Avoid street drugs and minimize use of alcohol.  Start taking a daily multivitamin after consulting your caregiver. SEEK MEDICAL CARE IF:   You have persistent tiredness, which cannot be accounted for.  You have fever.  You have unintentional weight loss.  You have headaches.  You have disturbed sleep throughout the night.  You are feeling sad.  You have constipation.  You have dry skin.  You have gained weight.  You are taking any new or different medicines that you suspect are causing fatigue.  You are unable to sleep at night.  You develop any unusual swelling of your legs or other parts of your body. SEEK IMMEDIATE MEDICAL CARE IF:   You are feeling confused.  Your vision is blurred.  You feel faint or pass out.  You develop severe headache.  You develop severe abdominal, pelvic, or   back pain.  You develop chest pain, shortness of breath, or an irregular or fast heartbeat.  You are unable to pass a normal amount of urine.  You develop abnormal bleeding such as bleeding from the rectum or you vomit blood.  You have thoughts  about harming yourself or committing suicide.  You are worried that you might harm someone else. MAKE SURE YOU:   Understand these instructions.  Will watch your condition.  Will get help right away if you are not doing well or get worse. Document Released: 01/12/2007 Document Revised: 06/09/2011 Document Reviewed: 07/19/2013 [facility] Patient Information 2015 [place], [place]. This information is not intended to replace advice given to you by your health care provider. Make sure you discuss any questions you have with your health care provider.  1500 calories a day.  Exercise 150 minutes a week.  contrave-long term  Phentermine- appetite decrease.

## 2014-06-05 ENCOUNTER — Ambulatory Visit: Payer: Federal, State, Local not specified - PPO | Admitting: Physician Assistant

## 2014-11-01 ENCOUNTER — Encounter: Payer: Self-pay | Admitting: Family Medicine

## 2014-11-01 ENCOUNTER — Ambulatory Visit (INDEPENDENT_AMBULATORY_CARE_PROVIDER_SITE_OTHER): Payer: Federal, State, Local not specified - PPO | Admitting: Family Medicine

## 2014-11-01 VITALS — BP 137/88 | HR 96 | Wt 284.0 lb

## 2014-11-01 DIAGNOSIS — M5489 Other dorsalgia: Secondary | ICD-10-CM

## 2014-11-01 DIAGNOSIS — R109 Unspecified abdominal pain: Secondary | ICD-10-CM | POA: Insufficient documentation

## 2014-11-01 LAB — POCT URINALYSIS DIPSTICK
BILIRUBIN UA: NEGATIVE
Blood, UA: NEGATIVE
GLUCOSE UA: NEGATIVE
Ketones, UA: NEGATIVE
LEUKOCYTES UA: NEGATIVE
Nitrite, UA: NEGATIVE
Urobilinogen, UA: 0.2
pH, UA: 6

## 2014-11-01 NOTE — Progress Notes (Signed)
Jennifer Erickson is a 44 y.o. female who presents to [facility]  today for left flank pain. Symptoms present for one week. For 1 week. Patient denies any injury. No significant abdominal pain urinary frequency or urgency or dysuria. No blood in the urine. She has not tried any medications yet. Symptoms are moderate. Her biggest concern is to ensure that it is not a urinary tract infection or something more serious. Symptoms are bothersome but not bad enough to want to take medicines.   Past Medical History  Diagnosis Date  . Personal history of amenorrhea   . Sinusitis    Past Surgical History  Procedure Laterality Date  . Dilation and curettage of uterus    . Tonsillectomy     History  Substance Use Topics  . Smoking status: Never Smoker   . Smokeless tobacco: Never Used  . Alcohol Use: No   ROS as above Medications: Current Outpatient Prescriptions  Medication Sig Dispense Refill  . etonogestrel (IMPLANON) 68 MG IMPL implant Inject 1 each into the skin once.     No current facility-administered medications for this visit.   Allergies  Allergen Reactions  . Aspirin Other (See Comments)    GI issues  . Ciprofloxacin     Nausea and vomiting.   Samuel Germany Dye [Iodinated Diagnostic Agents]      Exam:  BP 137/88 mmHg  Pulse 96  Wt 284 lb (128.822 kg) Gen: Well NAD HEENT: EOMI,  MMM Lungs: Normal work of breathing. CTABL Heart: RRR no MRG Abd: NABS, Soft. Nondistended, Nontender no CV angle tenderness to percussion Exts: Brisk capillary refill, warm and well perfused.   Results for orders placed or performed in visit on 11/01/14 (from the past 24 hour(s))  POCT Urinalysis Dipstick     Status: None   Collection Time: 11/01/14  1:34 PM  Result Value Ref Range   Color, UA yellow    Clarity, UA clear    Glucose, UA neg    Bilirubin, UA neg    Ketones, UA neg    Spec Grav, UA >=1.030    Blood, UA neg    pH, UA 6.0    Protein, UA  trace    Urobilinogen, UA 0.2    Nitrite, UA neg    Leukocytes, UA Negative Negative   No results found.   Please see individual assessment and plan sections.

## 2014-11-01 NOTE — Patient Instructions (Signed)
Thank you for coming in today. If your belly pain worsens, or you have high fever, bad vomiting, blood in your stool or black tarry stool go to the Emergency Room.  Come back or go to the emergency room if you notice new weakness new numbness problems walking or bowel or bladder problems.  Lumbosacral Strain Lumbosacral strain is a strain of any of the parts that make up your lumbosacral vertebrae. Your lumbosacral vertebrae are the bones that make up the lower third of your backbone. Your lumbosacral vertebrae are held together by muscles and tough, fibrous tissue (ligaments).  CAUSES  A sudden blow to your back can cause lumbosacral strain. Also, anything that causes an excessive stretch of the muscles in the low back can cause this strain. This is typically seen when people exert themselves strenuously, fall, lift heavy objects, bend, or crouch repeatedly. RISK FACTORS  Physically demanding work.  Participation in pushing or pulling sports or sports that require a sudden twist of the back (tennis, golf, baseball).  Weight lifting.  Excessive lower back curvature.  Forward-tilted pelvis.  Weak back or abdominal muscles or both.  Tight hamstrings. SIGNS AND SYMPTOMS  Lumbosacral strain may cause pain in the area of your injury or pain that moves (radiates) down your leg.  DIAGNOSIS Your health care provider can often diagnose lumbosacral strain through a physical exam. In some cases, you may need tests such as X-ray exams.  TREATMENT  Treatment for your lower back injury depends on many factors that your clinician will have to evaluate. However, most treatment will include the use of anti-inflammatory medicines. HOME CARE INSTRUCTIONS   Avoid hard physical activities (tennis, racquetball, waterskiing) if you are not in proper physical condition for it. This may aggravate or create problems.  If you have a back problem, avoid sports requiring sudden body movements. Swimming and  walking are generally safer activities.  Maintain good posture.  Maintain a healthy weight.  For acute conditions, you may put ice on the injured area.  Put ice in a plastic bag.  Place a towel between your skin and the bag.  Leave the ice on for 20 minutes, 2-3 times a day.  When the low back starts healing, stretching and strengthening exercises may be recommended. SEEK MEDICAL CARE IF:  Your back pain is getting worse.  You experience severe back pain not relieved with medicines. SEEK IMMEDIATE MEDICAL CARE IF:   You have numbness, tingling, weakness, or problems with the use of your arms or legs.  There is a change in bowel or bladder control.  You have increasing pain in any area of the body, including your belly (abdomen).  You notice shortness of breath, dizziness, or feel faint.  You feel sick to your stomach (nauseous), are throwing up (vomiting), or become sweaty.  You notice discoloration of your toes or legs, or your feet get very cold. MAKE SURE YOU:   Understand these instructions.  Will watch your condition.  Will get help right away if you are not doing well or get worse. Document Released: 12/25/2004 Document Revised: 03/22/2013 Document Reviewed: 11/03/2012 [facility] Patient Information 2015 [place], [place]. This information is not intended to replace advice given to you by your health care provider. Make sure you discuss any questions you have with your health care provider.

## 2014-11-01 NOTE — Assessment & Plan Note (Signed)
Likely musculoskeletal. Urinalysis is free of signs of infection and no blood present. Doubtful for kidney stone. Treat with heating pad at home exercises. If not better or return to clinic. At that time may consider noncontrast CT scan of the abdomen and pelvis to evaluate for kidney stone. Urine culture pending.

## 2014-11-03 LAB — URINE CULTURE
Colony Count: NO GROWTH
Organism ID, Bacteria: NO GROWTH

## 2014-12-04 ENCOUNTER — Encounter: Payer: Self-pay | Admitting: *Deleted

## 2014-12-04 ENCOUNTER — Emergency Department
Admission: EM | Admit: 2014-12-04 | Discharge: 2014-12-04 | Disposition: A | Discharge status: Home / Self Care | Payer: Federal, State, Local not specified - PPO | Source: Home / Self Care | Attending: Family Medicine | Admitting: Family Medicine

## 2014-12-04 ENCOUNTER — Emergency Department (INDEPENDENT_AMBULATORY_CARE_PROVIDER_SITE_OTHER): Payer: Federal, State, Local not specified - PPO

## 2014-12-04 DIAGNOSIS — M179 Osteoarthritis of knee, unspecified: Secondary | ICD-10-CM | POA: Diagnosis not present

## 2014-12-04 DIAGNOSIS — M1711 Unilateral primary osteoarthritis, right knee: Secondary | ICD-10-CM

## 2014-12-04 DIAGNOSIS — M25561 Pain in right knee: Secondary | ICD-10-CM | POA: Diagnosis not present

## 2014-12-04 MED ORDER — MELOXICAM 7.5 MG PO TABS: 7.5000 mg | ORAL_TABLET | Freq: Every day | ORAL | Status: DC

## 2014-12-04 NOTE — ED Provider Notes (Signed)
CSN: [id number]     Arrival date & time 12/04/14  1252 History   First MD Initiated Contact with Patient 12/04/14 1254     Chief Complaint  Patient presents with  . Knee Pain   (Consider location/radiation/quality/duration/timing/severity/associated sxs/prior Treatment) HPI Pt is a 44yo female presenting to [facility] with c/o gradually worsening Right knee pain that started 4 days ago. Pt denies known injury including fall or heavy lifting. Denies new shoes or being on her feet more than normal. Pain is aching and sore, worse when driving but not when standing still. Pain is 8/10 at this time. She has not tried anything for pain as she does not like taking medications. Pt does report mild lower back pain and bilateral posterior leg pain but states this is chronic and intermittent but anterior Right knee pain is new.    Past Medical History  Diagnosis Date  . Personal history of amenorrhea   . Sinusitis    Past Surgical History  Procedure Laterality Date  . Dilation and curettage of uterus    . Tonsillectomy     Family History  Problem Relation Age of Onset  . Cancer Mother   . Hypertension Father   . Diabetes Brother    Social History  Substance Use Topics  . Smoking status: Never Smoker   . Smokeless tobacco: Never Used  . Alcohol Use: No   OB History    No data available     Review of Systems  Constitutional: Negative for fever and chills.  Musculoskeletal: Positive for arthralgias. Negative for myalgias and joint swelling.       Right knee  Skin: Negative for color change and wound.  Neurological: Negative for weakness and numbness.    Allergies  Aspirin; Ciprofloxacin; and Ivp dye  Home Medications   Prior to Admission medications   Medication Sig Start Date End Date Taking? Authorizing Provider  etonogestrel (IMPLANON) 68 MG IMPL implant Inject 1 each into the skin once.    Historical Provider, MD  meloxicam (MOBIC) 7.5 MG tablet Take 1 tablet (7.5 mg total) by  mouth daily. 12/04/14   Noland Fordyce, PA-C   Meds Ordered and Administered this Visit  Medications - No data to display  BP 133/95 mmHg  Pulse 96  Resp 16  Wt 282 lb (127.914 kg)  SpO2 99% No data found.   Physical Exam  Constitutional: She is oriented to person, place, and time. She appears well-developed and well-nourished.  HENT:  Head: Normocephalic and atraumatic.  Eyes: EOM are normal.  Neck: Normal range of motion.  Cardiovascular: Normal rate.   Pulmonary/Chest: Effort normal.  Musculoskeletal: Normal range of motion. She exhibits tenderness. She exhibits no edema.  Right knee: no deformity or edema. Tenderness superior to patella. FROM, no crepitus. No tenderness in joint spaces.   Compartments in Right leg are soft, non-tender.  Normal gait.  Neurological: She is alert and oriented to person, place, and time.  Skin: Skin is warm and dry. No erythema.  Right knee: Skin in tact. No ecchymosis, erythema, or warmth. No red streaking, induration, or evidence of underlying infection.  Psychiatric: She has a normal mood and affect. Her behavior is normal.  Nursing note and vitals reviewed.   ED Course  Procedures (including critical care time)  Labs Review Labs Reviewed - No data to display  Imaging Review Dg Knee Complete 4 Views Right  12/04/2014   CLINICAL DATA:  44 year old female with anterior right knee pain for the  past 4 days  EXAM: RIGHT KNEE - COMPLETE 4+ VIEW  COMPARISON:  None.  FINDINGS: No evidence of acute fracture or malalignment. No joint effusion. Patellofemoral compartment osteoarthritis with narrowing of the joint space and superior patellar osteophyte formation. No focal soft tissue abnormality. Normal bony mineralization.  IMPRESSION: 1. No acute fracture or malalignment. 2. Patellofemoral compartment degenerative osteoarthritis.   Electronically Signed   By: Jacqulynn Cadet M.D.   On: 12/04/2014 13:40      MDM   1. Osteoarthritis of right knee,  unspecified osteoarthritis type   2. Right knee pain    Pt c/o Right knee pain w/o known injury. No evidence of underlying infection.  Plain films: OA of Right knee, no acute findings. Offered pt knee sleeve, pt states she has several at home. Rx: mobic for up to 2 weeks. Home care instructions provided. RICE (rest, ice, compression, elevation) F/u with Sports Medicine in 1-2 weeks if not improving. Patient verbalized understanding and agreement with treatment plan.    Noland Fordyce, PA-C 12/04/14 [phone]

## 2014-12-04 NOTE — ED Notes (Signed)
Pt c/o right anterior knee pain x 4 days. Aching in the back of both legs/knees as well.

## 2014-12-06 ENCOUNTER — Ambulatory Visit (INDEPENDENT_AMBULATORY_CARE_PROVIDER_SITE_OTHER): Payer: Federal, State, Local not specified - PPO | Admitting: Physician Assistant

## 2014-12-06 ENCOUNTER — Encounter: Payer: Self-pay | Admitting: Physician Assistant

## 2014-12-06 VITALS — BP 130/75 | HR 90 | Ht 66.0 in | Wt 284.0 lb

## 2014-12-06 DIAGNOSIS — M25561 Pain in right knee: Secondary | ICD-10-CM | POA: Diagnosis not present

## 2014-12-06 NOTE — Progress Notes (Signed)
   Subjective:    Patient ID: Jennifer Erickson, female    DOB: Aug 02, 1970, 44 y.o.   MRN: [id number]  HPI  Pt presents to the clinic to follow up for right knee pain. She was seen in [facility] on 9/5. Xrays were negative. She was given mobic and advised to start RICE. She admits she is not using mobic. No real improvement in pain. Pain is located right anterior suprapatellar knee. Pain is worse when weight bearing and driving. Just sitting there is no pain. No know trauma.   Review of Systems  All other systems reviewed and are negative.      Objective:   Physical Exam  Constitutional: She is oriented to person, place, and time. She appears well-developed and well-nourished.  Obese.  Cardiovascular: Normal rate, regular rhythm and normal heart sounds.   Pulmonary/Chest: Effort normal and breath sounds normal.  Musculoskeletal:  Right knee pain: Flexion decreased due to pain to 100 degrees.  No pain to palpation along the joint space.  Pain over quadricept muscleinsertion  to patella.  Fluid wave noted over suprapatellar area.  Strength of right lower extremity 5/5.  Negative anterior drawer. Negative mcmurrays.     Neurological: She is alert and oriented to person, place, and time.  Psychiatric: She has a normal mood and affect. Her behavior is normal.          Assessment & Plan:  Right knee pain- it seems pt has strained her quad muscles. written out of work due to amount of walking for next few days. We did put patient in brace. Pt educated and needs to start mobic 1-2 tablets daily. Ice and elevated as well. HO given to strengthen quads. Follow up as needed or in 4 weeks.

## 2014-12-06 NOTE — Patient Instructions (Signed)
Quadriceps Contusion with Rehab A contusion is a bruise to the skin and underlying tissues. The muscles on the top of the thigh (quadriceps) are a common location for a contusion. The contusion is caused by trauma that results in bleeding that enters the muscles, tendons, and skin.  SYMPTOMS   Pain, inflammation, and tenderness over the quadriceps muscle.  Characteristic "black and blue" discoloration.  Feeling of firmness when pressure is exerted at the injury site.  Limited function of the quadriceps muscles (straightening the knee or bending the hip).  Knee stiffness or pain when trying to bend the knee. CAUSES  Quadriceps contusions are caused by direct trauma to the thigh the ruptures small blood vessels, allowing blood to enter the soft tissues. RISK INCREASES WITH:  Contact sports (football, rugby, or soccer).  Failure to wear protective equipment.  Bleeding disorder or use of blood thinners (anticoagulants), or nonsteroidal anti-inflammatory medications. PREVENTION   Wear properly fitted and padded protective equipment.  If you have a bleeding disorder, then avoid any activities that may result in a traumatic injury.  Limit use of anticoagulants and nonsteroidal anti-inflammatory medications. PROGNOSIS  If treated properly, then quadriceps contusions usually heal within 2 weeks.  RELATED COMPLICATIONS   Complications from excessive bleeding, especially compartment syndrome.  Bone formation (calcification) that occurs during healing and may result in pain or limited function.  Infection (uncommon).  Knee stiffness or loss of motion.  Prolonged healing time, if improperly treated or re-injured. TREATMENT  Treatment initially involves the use of ice and medication to help reduce pain and inflammation. Do not take nonsteroidal anti-inflammatory medications within 3 days of injury. The use of strengthening and stretching exercises may help reduce pain with activity.  These exercises may be performed at home or with referral to a therapist. Compression bandages may also help reduce pain and inflammation. Rarely, your caregiver may use a needle to remove the collection of blood under the skin (hematoma).  MEDICATION If pain medication is necessary, then nonsteroidal anti-inflammatory medications, such as aspirin and ibuprofen, or other minor pain relievers, such as acetaminophen, are often recommended.  HEAT AND COLD  Cold treatment (icing) relieves pain and reduces inflammation. Cold treatment should be applied for 10 to 15 minutes every 2 to 3 hours for inflammation and pain and immediately after any activity that aggravates your symptoms. Use ice packs or massage the area with a piece of ice (ice massage).  Heat treatment may be used prior to performing the stretching and strengthening activities prescribed by your caregiver, physical therapist, or athletic trainer. Use a heat pack or soak the injury in warm water. SEEK MEDICAL CARE IF:  Treatment seems to offer no benefit, or the condition worsens.  Any medications produce adverse side effects. EXERCISES  RANGE OF MOTION (ROM) AND STRETCHING EXERCISES - Quadriceps Contusion These exercises may help you when beginning to rehabilitate your injury. Your symptoms may resolve with or without further involvement from your physician, physical therapist or athletic trainer. While completing these exercises, remember:   Restoring tissue flexibility helps normal motion to return to the joints. This allows healthier, less painful movement and activity.  An effective stretch should be held for at least 30 seconds.  A stretch should never be painful. You should only feel a gentle lengthening or release in the stretched tissue. RANGE OF MOTION - Knee Flexion, Active  Lie on your back with both knees straight. (If this causes back discomfort, bend your opposite knee, placing your foot flat on  the floor.)  Slowly  slide your heel back toward your buttocks until you feel a gentle stretch in the front of your knee or thigh.  Hold for __________ seconds. Slowly slide your heel back to the starting position. Repeat __________ times. Complete this exercise __________ times per day.  STRETCH - Quadriceps, Prone   Lie on your stomach on a firm surface, such as a bed or padded floor.  Bend your right / left knee and grasp your ankle. If you are unable to reach, your ankle or pant leg, use a belt around your foot to lengthen your reach.  Gently pull your heel toward your buttocks. Your knee should not slide out to the side. You should feel a stretch in the front of your thigh and/or knee.  Hold this position for __________ seconds. Repeat __________ times. Complete this stretch __________ times per day.  STRENGTHENING EXERCISES - Quadriceps Contusion These exercises may help you when beginning to rehabilitate your injury. They may resolve your symptoms with or without further involvement from your physician, physical therapist or athletic trainer. While completing these exercises, remember:   Muscles can gain both the endurance and the strength needed for everyday activities through controlled exercises.  Complete these exercises as instructed by your physician, physical therapist or athletic trainer. Progress the resistance and repetitions only as guided. STRENGTH - Quadriceps, Isometrics  Lie on your back with your right / left leg extended and your opposite knee bent.  Gradually tense the muscles in the front of your right / left thigh. You should see either your knee cap slide up toward your hip or increased dimpling just above the knee. This motion will push the back of the knee down toward the floor/mat/bed on which you are lying.  Hold the muscle as tight as you can without increasing your pain for __________ seconds.  Relax the muscles slowly and completely in between each repetition. Repeat  __________ times. Complete this exercise __________ times per day.  STRENGTH - Quadriceps, Short Arcs   Lie on your back. Place a __________ inch towel roll under your knee so that the knee slightly bends.  Raise only your lower leg by tightening the muscles in the front of your thigh. Do not allow your thigh to rise.  Hold this position for __________ seconds. Repeat __________ times. Complete this exercise __________ times per day.  OPTIONAL ANKLE WEIGHTS: Begin with ____________________, but DO NOT exceed ____________________. Increase in 1 lb/0.5 kg increments.  STRENGTH - Quadriceps, Straight Leg Raises  Quality counts! Watch for signs that the quadriceps muscle is working to insure you are strengthening the correct muscles and not "cheating" by substituting with healthier muscles.  Lay on your back with your right / left leg extended and your opposite knee bent.  Tense the muscles in the front of your right / left thigh. You should see either your knee cap slide up or increased dimpling just above the knee. Your thigh may even quiver.  Tighten these muscles even more and raise your leg 4 to 6 inches off the floor. Hold for __________ seconds.  Keeping these muscles tense, lower your leg.  Relax the muscles slowly and completely in between each repetition. Repeat __________ times. Complete this exercise __________ times per day.  STRENGTH - Quadriceps, Wall Slides  Follow guidelines for form closely. Increased knee pain often results from poorly placed feet or knees.  Lean against a smooth wall or door and walk your feet out 18-24 inches.  Place your feet hip-width apart.  Slowly slide down the wall or door until your knees bend __________ degrees.* Keep your knees over your heels, not your toes, and in line with your hips, not falling to either side.  Hold for __________ seconds. Stand up to rest for __________ seconds in between each repetition. Repeat __________ times. Complete  this exercise __________ times per day. * Your physician, physical therapist or athletic trainer will alter this angle based on your symptoms and progress. Document Released: 03/17/2005 Document Revised: 06/09/2011 Document Reviewed: 06/29/2008 [facility] Patient Information 2015 [place], [place]. This information is not intended to replace advice given to you by your health care provider. Make sure you discuss any questions you have with your health care provider.

## 2014-12-08 ENCOUNTER — Telehealth: Payer: Self-pay | Admitting: *Deleted

## 2014-12-08 ENCOUNTER — Encounter: Payer: Self-pay | Admitting: Physician Assistant

## 2014-12-08 ENCOUNTER — Encounter: Payer: Self-pay | Admitting: *Deleted

## 2014-12-08 DIAGNOSIS — M170 Bilateral primary osteoarthritis of knee: Secondary | ICD-10-CM | POA: Insufficient documentation

## 2014-12-08 NOTE — Telephone Encounter (Signed)
Pt left vm yesterday asking if you would write her out of work for 2 more days.  She stated that she is wearing the brace and it is helping, she would just like a couple extra days.  Please advise.

## 2014-12-08 NOTE — Telephone Encounter (Signed)
Ok for note 

## 2014-12-12 ENCOUNTER — Ambulatory Visit (INDEPENDENT_AMBULATORY_CARE_PROVIDER_SITE_OTHER): Payer: Federal, State, Local not specified - PPO | Admitting: Physician Assistant

## 2014-12-12 ENCOUNTER — Encounter: Payer: Self-pay | Admitting: Physician Assistant

## 2014-12-12 VITALS — BP 105/51 | HR 87 | Temp 98.5°F | Ht 66.0 in | Wt 283.0 lb

## 2014-12-12 DIAGNOSIS — J069 Acute upper respiratory infection, unspecified: Secondary | ICD-10-CM | POA: Diagnosis not present

## 2014-12-12 DIAGNOSIS — M25561 Pain in right knee: Secondary | ICD-10-CM | POA: Diagnosis not present

## 2014-12-12 MED ORDER — AMOXICILLIN-POT CLAVULANATE 875-125 MG PO TABS: 1.0000 | ORAL_TABLET | Freq: Two times a day (BID) | ORAL | Status: DC

## 2014-12-12 NOTE — Patient Instructions (Signed)
Upper Respiratory Infection, Adult An upper respiratory infection (URI) is also sometimes known as the common cold. The upper respiratory tract includes the nose, sinuses, throat, trachea, and bronchi. Bronchi are the airways leading to the lungs. Most people improve within 1 week, but symptoms can last up to 2 weeks. A residual cough may last even longer.  CAUSES Many different viruses can infect the tissues lining the upper respiratory tract. The tissues become irritated and inflamed and often become very moist. Mucus production is also common. A cold is contagious. You can easily spread the virus to others by oral contact. This includes kissing, sharing a glass, coughing, or sneezing. Touching your mouth or nose and then touching a surface, which is then touched by another person, can also spread the virus. SYMPTOMS  Symptoms typically develop 1 to 3 days after you come in contact with a cold virus. Symptoms vary from person to person. They may include:  Runny nose.  Sneezing.  Nasal congestion.  Sinus irritation.  Sore throat.  Loss of voice (laryngitis).  Cough.  Fatigue.  Muscle aches.  Loss of appetite.  Headache.  Low-grade fever. DIAGNOSIS  You might diagnose your own cold based on familiar symptoms, since most people get a cold 2 to 3 times a year. Your caregiver can confirm this based on your exam. Most importantly, your caregiver can check that your symptoms are not due to another disease such as strep throat, sinusitis, pneumonia, asthma, or epiglottitis. Blood tests, throat tests, and X-rays are not necessary to diagnose a common cold, but they may sometimes be helpful in excluding other more serious diseases. Your caregiver will decide if any further tests are required. RISKS AND COMPLICATIONS  You may be at risk for a more severe case of the common cold if you smoke cigarettes, have chronic heart disease (such as heart failure) or lung disease (such as asthma), or if  you have a weakened immune system. The very young and very old are also at risk for more serious infections. Bacterial sinusitis, middle ear infections, and bacterial pneumonia can complicate the common cold. The common cold can worsen asthma and chronic obstructive pulmonary disease (COPD). Sometimes, these complications can require emergency medical care and may be life-threatening. PREVENTION  The best way to protect against getting a cold is to practice good hygiene. Avoid oral or hand contact with people with cold symptoms. Wash your hands often if contact occurs. There is no clear evidence that vitamin C, vitamin E, echinacea, or exercise reduces the chance of developing a cold. However, it is always recommended to get plenty of rest and practice good nutrition. TREATMENT  Treatment is directed at relieving symptoms. There is no cure. Antibiotics are not effective, because the infection is caused by a virus, not by bacteria. Treatment may include:  Increased fluid intake. Sports drinks offer valuable electrolytes, sugars, and fluids.  Breathing heated mist or steam (vaporizer or shower).  Eating chicken soup or other clear broths, and maintaining good nutrition.  Getting plenty of rest.  Using gargles or lozenges for comfort.  Controlling fevers with ibuprofen or acetaminophen as directed by your caregiver.  Increasing usage of your inhaler if you have asthma. Zinc gel and zinc lozenges, taken in the first 24 hours of the common cold, can shorten the duration and lessen the severity of symptoms. Pain medicines may help with fever, muscle aches, and throat pain. A variety of non-prescription medicines are available to treat congestion and runny nose. Your caregiver   can make recommendations and may suggest nasal or lung inhalers for other symptoms.  HOME CARE INSTRUCTIONS   Only take over-the-counter or prescription medicines for pain, discomfort, or fever as directed by your  caregiver.  Use a warm mist humidifier or inhale steam from a shower to increase air moisture. This may keep secretions moist and make it easier to breathe.  Drink enough water and fluids to keep your urine clear or pale yellow.  Rest as needed.  Return to work when your temperature has returned to normal or as your caregiver advises. You may need to stay home longer to avoid infecting others. You can also use a face mask and careful hand washing to prevent spread of the virus. SEEK MEDICAL CARE IF:   After the first few days, you feel you are getting worse rather than better.  You need your caregiver's advice about medicines to control symptoms.  You develop chills, worsening shortness of breath, or brown or red sputum. These may be signs of pneumonia.  You develop yellow or brown nasal discharge or pain in the face, especially when you bend forward. These may be signs of sinusitis.  You develop a fever, swollen neck glands, pain with swallowing, or white areas in the back of your throat. These may be signs of strep throat. SEEK IMMEDIATE MEDICAL CARE IF:   You have a fever.  You develop severe or persistent headache, ear pain, sinus pain, or chest pain.  You develop wheezing, a prolonged cough, cough up blood, or have a change in your usual mucus (if you have chronic lung disease).  You develop sore muscles or a stiff neck. Document Released: 09/10/2000 Document Revised: 06/09/2011 Document Reviewed: 06/22/2013 [employer] Patient Information 2015 [employer]. This information is not intended to replace advice given to you by your health care provider. Make sure you discuss any questions you have with your health care provider.  

## 2014-12-12 NOTE — Progress Notes (Signed)
   Subjective:    Patient ID: Jennifer Erickson, female    DOB: [birth year], 44 y.o.   MRN: [id number]  HPI   Patient is a 44 year old female who presents to the clinic with cough, sinus congestion and body aches. She started feeling achy last week and has continued to progress. She does have some nausea but no vomiting. She has a lot of upper head congestion. She denies any shortness of breath or wheezing. She does have some sinus pressure, nasal congestion, cough with some production. She denies any ear pain. She denies any abdominal pain or abnormal stools.    Review of Systems  All other systems reviewed and are negative.      Objective:   Physical Exam  Constitutional: She is oriented to person, place, and time. She appears well-developed and well-nourished.  HENT:  Head: Normocephalic and atraumatic.  Bilateral TMs erythematous with some slight bulging membranes. No blood or pus present.  There is some tenderness over frontal and maxillary sinuses to tenderness.  Bilateral turbinates are red and swollen with rhinorrhea.  Oropharynx has postnasal drip present. No tonsillar exudate or enlargement.  Eyes: Conjunctivae are normal. Right eye exhibits no discharge. Left eye exhibits no discharge.  Neck:  Bilateral anterior cervical lymphadenopathy with some tenderness to palpation  Cardiovascular: Normal rate, regular rhythm and normal heart sounds.   Pulmonary/Chest: Effort normal and breath sounds normal. She has no wheezes.  Neurological: She is alert and oriented to person, place, and time.  Psychiatric: She has a normal mood and affect. Her behavior is normal.          Assessment & Plan:  URI- discussed symptomatic treatment with patient today such as Mucinex D and Flonase. I went and was given for 10 days. Follow-up if no improvement.   Right knee pain- short-term paperwork was filled out for absences due to right knee pain today.

## 2015-03-19 ENCOUNTER — Ambulatory Visit (INDEPENDENT_AMBULATORY_CARE_PROVIDER_SITE_OTHER): Payer: Federal, State, Local not specified - PPO | Admitting: Sports Medicine

## 2015-03-19 ENCOUNTER — Encounter: Payer: Self-pay | Admitting: Sports Medicine

## 2015-03-19 VITALS — BP 146/89 | HR 74 | Temp 98.0°F | Resp 18 | Wt 283.0 lb

## 2015-03-19 DIAGNOSIS — M170 Bilateral primary osteoarthritis of knee: Secondary | ICD-10-CM

## 2015-03-19 DIAGNOSIS — E66813 Obesity, class 3: Secondary | ICD-10-CM

## 2015-03-19 MED ORDER — PHENTERMINE HCL 37.5 MG PO TABS: ORAL_TABLET | ORAL | Status: DC

## 2015-03-19 NOTE — Addendum Note (Signed)
Addended by: Silverio Decamp on: 03/19/2015 11:16 AM   Modules accepted: Orders

## 2015-03-19 NOTE — Assessment & Plan Note (Signed)
Starting phentermine, referral to nutrition, further follow-ups with PCP.

## 2015-03-19 NOTE — Progress Notes (Signed)
   Subjective:    I'm seeing this patient as a consultation for:  Iran Planas, PA-C  CC:  Bilateral knee pain  HPI: For months this pleasant 44 year old female has had pain that she localizes in both knees, under the kneecap on the left side and at the medial joint line on the right. Pain is moderate, persistent, gelling in the morning and with periods of being still. She's tried oral naproxen without sufficient response. She is referred to me for further evaluation and definitive treatment. No mechanical symptoms with the exception of grinding.  Past medical history, Surgical history, Family history not pertinant except as noted below, Social history, Allergies, and medications have been entered into the medical record, reviewed, and no changes needed.   Review of Systems: No headache, visual changes, nausea, vomiting, diarrhea, constipation, dizziness, abdominal pain, skin rash, fevers, chills, night sweats, weight loss, swollen lymph nodes, body aches, joint swelling, muscle aches, chest pain, shortness of breath, mood changes, visual or auditory hallucinations.   Objective:   General: Well Developed, well nourished, and in no acute distress.  Neuro/Psych: Alert and oriented x3, extra-ocular muscles intact, able to move all 4 extremities, sensation grossly intact. Skin: Warm and dry, no rashes noted.  Respiratory: Not using accessory muscles, speaking in full sentences, trachea midline.  Cardiovascular: Pulses palpable, no extremity edema. Abdomen: Does not appear distended. Bilateral Knee: Minimal swollen, positive patellar crepitus bilaterally, tenderness at the medial joint line bilaterally. ROM normal in flexion and extension and lower leg rotation. Ligaments with solid consistent endpoints including ACL, PCL, LCL, MCL. Negative Mcmurray's and provocative meniscal tests. Painful patellar compression. Patellar and quadriceps tendons unremarkable. Hamstring and quadriceps strength  is normal.  Procedure: Real-time Ultrasound Guided Injection of left knee Device: GE Logiq E  Verbal informed consent obtained.  Time-out conducted.  Noted no overlying erythema, induration, or other signs of local infection.  Skin prepped in a sterile fashion.  Local anesthesia: Topical Ethyl chloride.  With sterile technique and under real time ultrasound guidance:  1 mL kenalog 40, 2 mL lidocaine, 2 mL Marcaine injected easily into the suprapatellar recess. Completed without difficulty  Pain immediately resolved suggesting accurate placement of the medication.  Advised to call if fevers/chills, erythema, induration, drainage, or persistent bleeding.  Images permanently stored and available for review in the ultrasound unit.  Impression: Technically successful ultrasound guided injection.  Procedure: Real-time Ultrasound Guided Injection of right knee Device: GE Logiq E  Verbal informed consent obtained.  Time-out conducted.  Noted no overlying erythema, induration, or other signs of local infection.  Skin prepped in a sterile fashion.  Local anesthesia: Topical Ethyl chloride.  With sterile technique and under real time ultrasound guidance:  1 mL kenalog 40, 2 mL lidocaine, 2 mL Marcaine injected easily into the suprapatellar recess. Completed without difficulty  Pain immediately resolved suggesting accurate placement of the medication.  Advised to call if fevers/chills, erythema, induration, drainage, or persistent bleeding.  Images permanently stored and available for review in the ultrasound unit.  Impression: Technically successful ultrasound guided injection.  Impression and Recommendations:   This case required medical decision making of moderate complexity.

## 2015-03-19 NOTE — Assessment & Plan Note (Signed)
X-ray confirmed and patellofemoral on the left, medial joint line on the right. Has failed oral NSAIDs, bilateral injection. Return in one month.

## 2015-04-03 ENCOUNTER — Other Ambulatory Visit: Payer: Self-pay | Admitting: Physician Assistant

## 2015-04-18 ENCOUNTER — Ambulatory Visit: Payer: Federal, State, Local not specified - PPO | Admitting: Physician Assistant

## 2015-04-18 ENCOUNTER — Ambulatory Visit: Payer: Federal, State, Local not specified - PPO | Admitting: Sports Medicine

## 2015-04-30 ENCOUNTER — Encounter: Payer: Federal, State, Local not specified - PPO | Admitting: Physician Assistant

## 2015-05-07 ENCOUNTER — Ambulatory Visit (INDEPENDENT_AMBULATORY_CARE_PROVIDER_SITE_OTHER): Payer: Federal, State, Local not specified - PPO | Admitting: Physician Assistant

## 2015-05-07 ENCOUNTER — Ambulatory Visit (INDEPENDENT_AMBULATORY_CARE_PROVIDER_SITE_OTHER): Payer: Federal, State, Local not specified - PPO | Admitting: Sports Medicine

## 2015-05-07 ENCOUNTER — Encounter: Payer: Self-pay | Admitting: Physician Assistant

## 2015-05-07 ENCOUNTER — Encounter: Payer: Self-pay | Admitting: Sports Medicine

## 2015-05-07 VITALS — BP 128/84 | HR 78 | Ht 66.0 in | Wt 278.0 lb

## 2015-05-07 VITALS — BP 128/84 | HR 78 | Resp 18 | Wt 278.0 lb

## 2015-05-07 DIAGNOSIS — M170 Bilateral primary osteoarthritis of knee: Secondary | ICD-10-CM

## 2015-05-07 DIAGNOSIS — M545 Low back pain, unspecified: Secondary | ICD-10-CM | POA: Insufficient documentation

## 2015-05-07 DIAGNOSIS — E66813 Obesity, class 3: Secondary | ICD-10-CM

## 2015-05-07 MED ORDER — MELOXICAM 15 MG PO TABS: ORAL_TABLET | ORAL | Status: DC

## 2015-05-07 MED ORDER — CYCLOBENZAPRINE HCL 10 MG PO TABS: 10.0000 mg | ORAL_TABLET | Freq: Three times a day (TID) | ORAL | Status: DC | PRN

## 2015-05-07 MED ORDER — PHENTERMINE HCL 37.5 MG PO TABS: ORAL_TABLET | ORAL | Status: DC

## 2015-05-07 MED FILL — CYCLOBENZAPRINE 10 MG TAB: 10 | 10 days supply | Qty: 30 | Fill #0

## 2015-05-07 MED FILL — MELOXICAM 15 MG TABLET: 15 | 30 days supply | Qty: 30 | Fill #0

## 2015-05-07 MED FILL — EPIPEN 2-PAK 0.3 MG AUTO-IN: 0.3 | 2 days supply | Qty: 2 | Fill #0

## 2015-05-07 NOTE — Assessment & Plan Note (Signed)
11 pound weight loss after the first 3 days of phentermine, and stopped it secondary to dry mouth. She has gained back 6 of those pounds. She will switch to one half tab daily for a week and then go to a full tab daily, and follow-up with PCP.

## 2015-05-07 NOTE — Patient Instructions (Signed)
Consider biofreeze.   saxenda information.

## 2015-05-07 NOTE — Progress Notes (Signed)
  Subjective:    CC: Follow-up  HPI: Bilateral knee osteoarthritis: Doing extremely well.  Obesity: Initial good weight loss, stop phentermine secondary to dry mouth. Wonders if she can do a half tab daily instead of whole tab.  Past medical history, Surgical history, Family history not pertinant except as noted below, Social history, Allergies, and medications have been entered into the medical record, reviewed, and no changes needed.   Review of Systems: No fevers, chills, night sweats, weight loss, chest pain, or shortness of breath.   Objective:    General: Well Developed, well nourished, and in no acute distress.  Neuro: Alert and oriented x3, extra-ocular muscles intact, sensation grossly intact.  HEENT: Normocephalic, atraumatic, pupils equal round reactive to light, neck supple, no masses, no lymphadenopathy, thyroid nonpalpable.  Skin: Warm and dry, no rashes. Cardiac: Regular rate and rhythm, no murmurs rubs or gallops, no lower extremity edema.  Respiratory: Clear to auscultation bilaterally. Not using accessory muscles, speaking in full sentences.  Impression and Recommendations:

## 2015-05-07 NOTE — Progress Notes (Signed)
   Subjective:    Patient ID: Jennifer Erickson, female    DOB: [birth year], 45 y.o.   MRN: [id number]  HPI Patient is a 45 year old female presenting for follow up regarding her pharmacotherapy regimen with phentermine for weight loss. Patient's concerns for this visit also include addressing low back pain. Patient states that she successfully lost eleven pounds with phentermine. However, she discontinued her medication due to adverse side effects, namely what she decribes as tongue swelling and ulceration with mouth dryness. She did not report any SOB, chest tightness, difficulty swallowing.Patient states that she has attempted to exercise more with a Pilates machine and is working with a nutritionist to consume smaller, more frequent meals. Patient rates her non-radiating L4/L5 back pain at 8/10 and describes it as "achy" but not constant. Patient states that pain is worse at night after a full day of activity. Patient states that she has taken Ibuprofen for relief but does not always help.    Review of Systems  All other systems reviewed and are negative.      Objective:   Physical Exam  Constitutional: She is oriented to person, place, and time. She appears well-developed and well-nourished.  Obese.  Cardiovascular: Normal rate, regular rhythm and normal heart sounds.   Pulmonary/Chest: Effort normal and breath sounds normal.  Musculoskeletal:  No tenderness over lumbar spine to palpation.  Tenderness over right lower back.  Muscles do seem to be tight over this area.  Negative straight leg test.  Neurological: She is alert and oriented to person, place, and time.  Psychiatric: She has a normal mood and affect. Her behavior is normal.          Assessment & Plan:  Obese- phentermine discussed to only take 1/2 tablet daily. Follow up in one month. If she has any symptoms of SOB, problems swallowing then stop medication. i would like for pt to consider adding saxenda with it.  Information given.  Patient was advised about ways to reduce the severity of phentermine side effects such as drinking more water, using saliva inducing candies/gums, and starting biotene supplementation.  Right sided low back pain without sciatica-mobic given as needed with flexeril as needed. Discussed heat therapy and biofreeze twice a day. Encouraged exercises to strengthen lower back. May need to consider physical therapy.  Hopefully weight loss can help.

## 2015-05-07 NOTE — Assessment & Plan Note (Signed)
Pain-free after injections the last visit, if recurrence of pain we will consider Visco supplementation.

## 2015-05-28 ENCOUNTER — Encounter: Payer: Federal, State, Local not specified - PPO | Admitting: Physician Assistant

## 2015-05-28 ENCOUNTER — Encounter: Payer: Self-pay | Admitting: Physician Assistant

## 2015-05-28 ENCOUNTER — Ambulatory Visit (INDEPENDENT_AMBULATORY_CARE_PROVIDER_SITE_OTHER): Payer: Federal, State, Local not specified - PPO | Admitting: Physician Assistant

## 2015-05-28 VITALS — BP 128/86 | HR 105 | Ht 66.0 in | Wt 268.0 lb

## 2015-05-28 DIAGNOSIS — M7711 Lateral epicondylitis, right elbow: Secondary | ICD-10-CM

## 2015-05-28 DIAGNOSIS — Z0000 Encounter for general adult medical examination without abnormal findings: Secondary | ICD-10-CM | POA: Diagnosis not present

## 2015-05-28 DIAGNOSIS — E559 Vitamin D deficiency, unspecified: Secondary | ICD-10-CM | POA: Diagnosis not present

## 2015-05-28 DIAGNOSIS — M7712 Lateral epicondylitis, left elbow: Secondary | ICD-10-CM | POA: Insufficient documentation

## 2015-05-28 DIAGNOSIS — Z13220 Encounter for screening for lipoid disorders: Secondary | ICD-10-CM | POA: Diagnosis not present

## 2015-05-28 DIAGNOSIS — N62 Hypertrophy of breast: Secondary | ICD-10-CM

## 2015-05-28 LAB — HM PAP SMEAR: HM PAP: NEGATIVE

## 2015-05-28 MED ORDER — DICLOFENAC EPOLAMINE 1.3 % TD PTCH
1.0000 | MEDICATED_PATCH | Freq: Two times a day (BID) | TRANSDERMAL | Status: DC
Start: 2015-05-28 — End: 2015-09-06

## 2015-05-28 MED ORDER — PHENTERMINE HCL 37.5 MG PO TABS: ORAL_TABLET | ORAL | Status: DC

## 2015-05-28 NOTE — Patient Instructions (Signed)
Lateral Epicondylitis With Rehab Lateral epicondylitis involves inflammation and pain around the outer portion of the elbow. The pain is caused by inflammation of the tendons in the forearm that bring back (extend) the wrist. Lateral epicondylitis is also called tennis elbow, because it is very common in tennis players. However, it may occur in any individual who extends the wrist repetitively. If lateral epicondylitis is left untreated, it may become a chronic problem. SYMPTOMS   Pain, tenderness, and inflammation on the outer (lateral) side of the elbow.  Pain or weakness with gripping activities.  Pain that increases with wrist-twisting motions (playing tennis, using a screwdriver, opening a door or a jar).  Pain with lifting objects, including a coffee cup. CAUSES  Lateral epicondylitis is caused by inflammation of the tendons that extend the wrist. Causes of injury may include:  Repetitive stress and strain on the muscles and tendons that extend the wrist.  Sudden change in activity level or intensity.  Incorrect grip in racquet sports.  Incorrect grip size of racquet (often too large).  Incorrect hitting position or technique (usually backhand, leading with the elbow).  Using a racket that is too heavy. RISK INCREASES WITH:  Sports or occupations that require repetitive and/or strenuous forearm and wrist movements (tennis, squash, racquetball, carpentry).  Poor wrist and forearm strength and flexibility.  Failure to warm up properly before activity.  Resuming activity before healing, rehabilitation, and conditioning are complete. PREVENTION   Warm up and stretch properly before activity.  Maintain physical fitness:  Strength, flexibility, and endurance.  Cardiovascular fitness.  Wear and use properly fitted equipment.  Learn and use proper technique and have a coach correct improper technique.  Wear a tennis elbow (counterforce) brace. PROGNOSIS  The course of  this condition depends on the degree of the injury. If treated properly, acute cases (symptoms lasting less than 4 weeks) are often resolved in 2 to 6 weeks. Chronic (longer lasting cases) often resolve in 3 to 6 months but may require physical therapy. RELATED COMPLICATIONS   Frequently recurring symptoms, resulting in a chronic problem. Properly treating the problem the first time decreases frequency of recurrence.  Chronic inflammation, scarring tendon degeneration, and partial tendon tear, requiring surgery.  Delayed healing or resolution of symptoms. TREATMENT  Treatment first involves the use of ice and medicine to reduce pain and inflammation. Strengthening and stretching exercises may help reduce discomfort if performed regularly. These exercises may be performed at home if the condition is an acute injury. Chronic cases may require a referral to a physical therapist for evaluation and treatment. Your caregiver may advise a corticosteroid injection to help reduce inflammation. Rarely, surgery is needed. MEDICATION  If pain medicine is needed, nonsteroidal anti-inflammatory medicines (aspirin and ibuprofen), or other minor pain relievers (acetaminophen), are often advised.  Do not take pain medicine for 7 days before surgery.  Prescription pain relievers may be given, if your caregiver thinks they are needed. Use only as directed and only as much as you need.  Corticosteroid injections may be recommended. These injections should be reserved only for the most severe cases, because they can only be given a certain number of times. HEAT AND COLD  Cold treatment (icing) should be applied for 10 to 15 minutes every 2 to 3 hours for inflammation and pain, and immediately after activity that aggravates your symptoms. Use ice packs or an ice massage.  Heat treatment may be used before performing stretching and strengthening activities prescribed by your caregiver, physical therapist,  or  Product/process development scientist. Use a heat pack or a warm water soak. SEEK MEDICAL CARE IF: Symptoms get worse or do not improve in 2 weeks, despite treatment. EXERCISES  RANGE OF MOTION (ROM) AND STRETCHING EXERCISES - Epicondylitis, Lateral (Tennis Elbow) These exercises may help you when beginning to rehabilitate your injury. Your symptoms may go away with or without further involvement from your physician, physical therapist, or athletic trainer. While completing these exercises, remember:   Restoring tissue flexibility helps normal motion to return to the joints. This allows healthier, less painful movement and activity.  An effective stretch should be held for at least 30 seconds.  A stretch should never be painful. You should only feel a gentle lengthening or release in the stretched tissue. RANGE OF MOTION - Wrist Flexion, Active-Assisted  Extend your right / left elbow with your fingers pointing down.*  Gently pull the back of your hand towards you, until you feel a gentle stretch on the top of your forearm.  Hold this position for __________ seconds. Repeat __________ times. Complete this exercise __________ times per day.  *If directed by your physician, physical therapist or athletic trainer, complete this stretch with your elbow bent, rather than extended. RANGE OF MOTION - Wrist Extension, Active-Assisted  Extend your right / left elbow and turn your palm upwards.*  Gently pull your palm and fingertips back, so your wrist extends and your fingers point more toward the ground.  You should feel a gentle stretch on the inside of your forearm.  Hold this position for __________ seconds. Repeat __________ times. Complete this exercise __________ times per day. *If directed by your physician, physical therapist or athletic trainer, complete this stretch with your elbow bent, rather than extended. STRETCH - Wrist Flexion  Place the back of your right / left hand on a tabletop, leaving your  elbow slightly bent. Your fingers should point away from your body.  Gently press the back of your hand down onto the table by straightening your elbow. You should feel a stretch on the top of your forearm.  Hold this position for __________ seconds. Repeat __________ times. Complete this stretch __________ times per day.  STRETCH - Wrist Extension   Place your right / left fingertips on a tabletop, leaving your elbow slightly bent. Your fingers should point backwards.  Gently press your fingers and palm down onto the table by straightening your elbow. You should feel a stretch on the inside of your forearm.  Hold this position for __________ seconds. Repeat __________ times. Complete this stretch __________ times per day.  STRENGTHENING EXERCISES - Epicondylitis, Lateral (Tennis Elbow) These exercises may help you when beginning to rehabilitate your injury. They may resolve your symptoms with or without further involvement from your physician, physical therapist, or athletic trainer. While completing these exercises, remember:   Muscles can gain both the endurance and the strength needed for everyday activities through controlled exercises.  Complete these exercises as instructed by your physician, physical therapist or athletic trainer. Increase the resistance and repetitions only as guided.  You may experience muscle soreness or fatigue, but the pain or discomfort you are trying to eliminate should never worsen during these exercises. If this pain does get worse, stop and make sure you are following the directions exactly. If the pain is still present after adjustments, discontinue the exercise until you can discuss the trouble with your caregiver. STRENGTH - Wrist Flexors  Sit with your right / left forearm palm-up and fully supported  on a table or countertop. Your elbow should be resting below the height of your shoulder. Allow your wrist to extend over the edge of the  surface.  Loosely holding a __________ weight, or a piece of rubber exercise band or tubing, slowly curl your hand up toward your forearm.  Hold this position for __________ seconds. Slowly lower the wrist back to the starting position in a controlled manner. Repeat __________ times. Complete this exercise __________ times per day.  STRENGTH - Wrist Extensors  Sit with your right / left forearm palm-down and fully supported on a table or countertop. Your elbow should be resting below the height of your shoulder. Allow your wrist to extend over the edge of the surface.  Loosely holding a __________ weight, or a piece of rubber exercise band or tubing, slowly curl your hand up toward your forearm.  Hold this position for __________ seconds. Slowly lower the wrist back to the starting position in a controlled manner. Repeat __________ times. Complete this exercise __________ times per day.  STRENGTH - Ulnar Deviators  Stand with a ____________________ weight in your right / left hand, or sit while holding a rubber exercise band or tubing, with your healthy arm supported on a table or countertop.  Move your wrist, so that your pinkie travels toward your forearm and your thumb moves away from your forearm.  Hold this position for __________ seconds and then slowly lower the wrist back to the starting position. Repeat __________ times. Complete this exercise __________ times per day STRENGTH - Radial Deviators  Stand with a ____________________ weight in your right / left hand, or sit while holding a rubber exercise band or tubing, with your injured arm supported on a table or countertop.  Raise your hand upward in front of you or pull up on the rubber tubing.  Hold this position for __________ seconds and then slowly lower the wrist back to the starting position. Repeat __________ times. Complete this exercise __________ times per day. STRENGTH - Forearm Supinators   Sit with your right /  left forearm supported on a table, keeping your elbow below shoulder height. Rest your hand over the edge, palm down.  Gently grip a hammer or a soup ladle.  Without moving your elbow, slowly turn your palm and hand upward to a "thumbs-up" position.  Hold this position for __________ seconds. Slowly return to the starting position. Repeat __________ times. Complete this exercise __________ times per day.  STRENGTH - Forearm Pronators   Sit with your right / left forearm supported on a table, keeping your elbow below shoulder height. Rest your hand over the edge, palm up.  Gently grip a hammer or a soup ladle.  Without moving your elbow, slowly turn your palm and hand upward to a "thumbs-up" position.  Hold this position for __________ seconds. Slowly return to the starting position. Repeat __________ times. Complete this exercise __________ times per day.  STRENGTH - Grip  Grasp a tennis ball, a dense sponge, or a large, rolled sock in your hand.  Squeeze as hard as you can, without increasing any pain.  Hold this position for __________ seconds. Release your grip slowly. Repeat __________ times. Complete this exercise __________ times per day.  STRENGTH - Elbow Extensors, Isometric  Stand or sit upright, on a firm surface. Place your right / left arm so that your palm faces your stomach, and it is at the height of your waist.  Place your opposite hand on the underside  of your forearm. Gently push up as your right / left arm resists. Push as hard as you can with both arms, without causing any pain or movement at your right / left elbow. Hold this stationary position for __________ seconds. Gradually release the tension in both arms. Allow your muscles to relax completely before repeating.   This information is not intended to replace advice given to you by your health care provider. Make sure you discuss any questions you have with your health care provider.   Document Released:  03/17/2005 Document Revised: 04/07/2014 Document Reviewed: 06/29/2008 Elsevier Interactive Patient Education [employer].

## 2015-05-28 NOTE — Progress Notes (Signed)
  Subjective:     Jennifer Erickson is a 45 y.o. female and is here for a comprehensive physical exam. The patient reports problems - see below.. Pt is having right lateral elbow pain since picking up a 72lb box 3 weeks ago. Hurts more the last 3 days. Moving makes worse. Nothing tried.     Social History   Social History  . Marital Status: Married    Spouse Name: N/A  . Number of Children: N/A  . Years of Education: N/A   Occupational History  . Not on file.   Social History Main Topics  . Smoking status: Never Smoker   . Smokeless tobacco: Never Used  . Alcohol Use: No  . Drug Use: No  . Sexual Activity: Yes    Birth Control/ Protection: Pill   Other Topics Concern  . Not on file   Social History Narrative   Health Maintenance  Topic Date Due  . HIV Screening  11/11/1985  . MAMMOGRAM  11/01/2013  . PAP SMEAR  06/08/2014  . INFLUENZA VACCINE  05/06/2016 (Originally 10/30/2014)  . TETANUS/TDAP  09/08/2018    The following portions of the patient's history were reviewed and updated as appropriate: allergies, current medications, past family history, past medical history, past social history, past surgical history and problem list.  Review of Systems Pertinent items noted in HPI and remainder of comprehensive ROS otherwise negative.   Objective:    BP 128/86 mmHg  Pulse 105  Ht 5' 6"  (1.676 m)  Wt 268 lb (121.564 kg)  BMI 43.28 kg/m2 General appearance: alert, cooperative, appears stated age and morbidly obese Head: Normocephalic, without obvious abnormality, atraumatic Eyes: conjunctivae/corneas clear. PERRL, EOM's intact. Fundi benign. Ears: normal TM's and external ear canals both ears Nose: Nares normal. Septum midline. Mucosa normal. No drainage or sinus tenderness. Throat: lips, mucosa, and tongue normal; teeth and gums normal Neck: no adenopathy, no carotid bruit, no JVD, supple, symmetrical, trachea midline and thyroid not enlarged, symmetric, no  tenderness/mass/nodules Back: symmetric, no curvature. ROM normal. No CVA tenderness. Lungs: clear to auscultation bilaterally Heart: regular rate and rhythm, S1, S2 normal, no murmur, click, rub or gallop Abdomen: soft, non-tender; bowel sounds normal; no masses,  no organomegaly Extremities: extremities normal, atraumatic, no cyanosis or edema Pulses: 2+ and symmetric Skin: Skin color, texture, turgor normal. No rashes or lesions Lymph nodes: Cervical, supraclavicular, and axillary nodes normal. Neurologic: Grossly normal    Assessment:    Healthy female exam.      Plan:    cpe-fasting labs ordered. Pap done elsewhere today. Mammogram up to date. Encouraged vitamin d and calcium. Encouraged weight loss. Fasting labs ordered.   Obesity- on phentermine. Discussed exercise and 1500 calorie diet. Large breast encouraged reduction.  Right lateral epicondylitis- treated with brace, exercises, ice, flexor patch, mobic prn. Follow up if not improving and one of sports meds doctors could consider injection.  See After Visit Summary for Counseling Recommendations

## 2015-05-29 LAB — LIPID PANEL
CHOLESTEROL: 162 mg/dL (ref 125–200)
HDL: 52 mg/dL (ref >=46)
LDL Cholesterol: 97 mg/dL (ref <130)
TRIGLYCERIDES: 63 mg/dL (ref <150)
Total CHOL/HDL Ratio: 3.1 Ratio (ref <=5.0)
VLDL: 13 mg/dL (ref <30)

## 2015-05-29 LAB — COMPLETE METABOLIC PANEL WITH GFR
ALBUMIN: 4.2 g/dL (ref 3.6–5.1)
ALK PHOS: 104 U/L (ref 33–115)
ALT: 26 U/L (ref 6–29)
AST: 19 U/L (ref 10–30)
BILIRUBIN TOTAL: 0.3 mg/dL (ref 0.2–1.2)
BUN: 15 mg/dL (ref 7–25)
CO2: 25 mmol/L (ref 20–31)
Calcium: 9.6 mg/dL (ref 8.6–10.2)
Chloride: 104 mmol/L (ref 98–110)
Creat: 1.02 mg/dL (ref 0.50–1.10)
GFR, EST AFRICAN AMERICAN: 77 mL/min (ref >=60)
GFR, EST NON AFRICAN AMERICAN: 67 mL/min (ref >=60)
Glucose, Bld: 95 mg/dL (ref 65–99)
Potassium: 4.4 mmol/L (ref 3.5–5.3)
Sodium: 139 mmol/L (ref 135–146)
TOTAL PROTEIN: 7.6 g/dL (ref 6.1–8.1)

## 2015-05-29 LAB — VITAMIN D 25 HYDROXY (VIT D DEFICIENCY, FRACTURES): Vit D, 25-Hydroxy: 12 ng/mL — ABNORMAL LOW (ref 30–100)

## 2015-05-29 MED FILL — PHENTERMINE 37.5 MG TABLET: 37.5 | 30 days supply | Qty: 30 | Fill #0

## 2015-05-30 ENCOUNTER — Encounter: Payer: Self-pay | Admitting: Sports Medicine

## 2015-05-30 ENCOUNTER — Ambulatory Visit (INDEPENDENT_AMBULATORY_CARE_PROVIDER_SITE_OTHER): Payer: Federal, State, Local not specified - PPO | Admitting: Sports Medicine

## 2015-05-30 VITALS — BP 126/86 | HR 97 | Resp 18 | Wt 267.9 lb

## 2015-05-30 DIAGNOSIS — M7711 Lateral epicondylitis, right elbow: Secondary | ICD-10-CM | POA: Diagnosis not present

## 2015-05-30 NOTE — Progress Notes (Signed)
  Subjective:    CC: Right elbow pain  HPI: For the past several weeks this pleasant 45 year old female has had worsening pain that she localizes at the tip of the lateral epicondyles with radiation into the dorsal forearm, moderate, persistent without radiation.  Past medical history, Surgical history, Family history not pertinant except as noted below, Social history, Allergies, and medications have been entered into the medical record, reviewed, and no changes needed.   Review of Systems: No fevers, chills, night sweats, weight loss, chest pain, or shortness of breath.   Objective:    General: Well Developed, well nourished, and in no acute distress.  Neuro: Alert and oriented x3, extra-ocular muscles intact, sensation grossly intact.  HEENT: Normocephalic, atraumatic, pupils equal round reactive to light, neck supple, no masses, no lymphadenopathy, thyroid nonpalpable.  Skin: Warm and dry, no rashes. Cardiac: Regular rate and rhythm, no murmurs rubs or gallops, no lower extremity edema.  Respiratory: Clear to auscultation bilaterally. Not using accessory muscles, speaking in full sentences. Right Elbow: Unremarkable to inspection. Range of motion full pronation, supination, flexion, extension. Strength is full to all of the above directions Stable to varus, valgus stress. Negative moving valgus stress test. Tender to palpation of the common extensor tendon origin Ulnar nerve does not sublux. Negative cubital tunnel Tinel's.  Procedure: Real-time Ultrasound Guided Injection of right common extensor tendon origin Device: GE Logiq E  Verbal informed consent obtained.  Time-out conducted.  Noted no overlying erythema, induration, or other signs of local infection.  Skin prepped in a sterile fashion.  Local anesthesia: Topical Ethyl chloride.  With sterile technique and under real time ultrasound guidance:  Medication injected both superficial to and deep to the origin of the common  extensor tendon on the lateral epicondyle using a total of 1 mL kenalog 40 2 mL lidocaine, 2 mL Marcaine. Completed without difficulty  Pain immediately resolved suggesting accurate placement of the medication.  Advised to call if fevers/chills, erythema, induration, drainage, or persistent bleeding.  Images permanently stored and available for review in the ultrasound unit.  Impression: Technically successful ultrasound guided injection.  Impression and Recommendations:

## 2015-05-30 NOTE — Assessment & Plan Note (Addendum)
Right common extensor tendon injection as above, rehabilitation exercises, return in one month. The elbow was also strapped with compressive dressing

## 2015-05-31 ENCOUNTER — Telehealth: Payer: Self-pay

## 2015-05-31 ENCOUNTER — Encounter: Payer: Self-pay | Admitting: Sports Medicine

## 2015-05-31 ENCOUNTER — Ambulatory Visit (INDEPENDENT_AMBULATORY_CARE_PROVIDER_SITE_OTHER): Payer: Federal, State, Local not specified - PPO | Admitting: Sports Medicine

## 2015-05-31 DIAGNOSIS — M7711 Lateral epicondylitis, right elbow: Secondary | ICD-10-CM | POA: Diagnosis not present

## 2015-05-31 MED ORDER — ERGOCALCIFEROL 1.25 MG (50000 UT) PO CAPS: 50000.0000 [IU] | ORAL_CAPSULE | ORAL | Status: DC

## 2015-05-31 MED ORDER — HYDROCODONE-ACETAMINOPHEN 5-325 MG PO TABS: 1.0000 | ORAL_TABLET | Freq: Three times a day (TID) | ORAL | Status: DC | PRN

## 2015-05-31 MED FILL — HYDROCODON-APAP 5-325: 5-325 | 13 days supply | Qty: 40 | Fill #0

## 2015-05-31 NOTE — Telephone Encounter (Signed)
Ordered vitamin d per lab note.

## 2015-05-31 NOTE — Assessment & Plan Note (Signed)
Postoperative pain several hours after injection yesterday, this is expected, adding hydrocodone, strap with compressive dressing, ice for 20 minutes 3-4 times a day. Reassurance given.

## 2015-05-31 NOTE — Progress Notes (Signed)
  Subjective:    CC: Right elbow pain  HPI: This is a pleasant 45 year old female, she had a common extensor tendon injection yesterday, less than 24 hours ago, she is having severe pain, and is wondering if something is wrong.  Past medical history, Surgical history, Family history not pertinant except as noted below, Social history, Allergies, and medications have been entered into the medical record, reviewed, and no changes needed.   Review of Systems: No fevers, chills, night sweats, weight loss, chest pain, or shortness of breath.   Objective:    General: Well Developed, well nourished, and in no acute distress.  Neuro: Alert and oriented x3, extra-ocular muscles intact, sensation grossly intact.  HEENT: Normocephalic, atraumatic, pupils equal round reactive to light, neck supple, no masses, no lymphadenopathy, thyroid nonpalpable.  Skin: Warm and dry, no rashes. Cardiac: Regular rate and rhythm, no murmurs rubs or gallops, no lower extremity edema.  Respiratory: Clear to auscultation bilaterally. Not using accessory muscles, speaking in full sentences. Right  Elbow: There is tenderness over the lateral epicondyle but no visible erythema, induration, or drainage. Range of motion full pronation, supination, flexion, extension. Strength is full to all of the above directions Stable to varus, valgus stress. Negative moving valgus stress test. No discrete areas of tenderness to palpation. Ulnar nerve does not sublux. Negative cubital tunnel Tinel's.  Impression and Recommendations:

## 2015-06-08 MED FILL — VIT D2 1.25 MG (50,000 UNIT: 1.25 MG | 56 days supply | Qty: 8 | Fill #0

## 2015-06-27 ENCOUNTER — Ambulatory Visit: Payer: Federal, State, Local not specified - PPO | Admitting: Sports Medicine

## 2015-07-10 ENCOUNTER — Encounter: Payer: Self-pay | Admitting: Physician Assistant

## 2015-07-10 ENCOUNTER — Ambulatory Visit (INDEPENDENT_AMBULATORY_CARE_PROVIDER_SITE_OTHER): Payer: Federal, State, Local not specified - PPO | Admitting: Physician Assistant

## 2015-07-10 VITALS — BP 124/77 | HR 92 | Ht 66.0 in | Wt 267.0 lb

## 2015-07-10 DIAGNOSIS — R002 Palpitations: Secondary | ICD-10-CM

## 2015-07-10 DIAGNOSIS — E669 Obesity, unspecified: Secondary | ICD-10-CM

## 2015-07-10 DIAGNOSIS — Z9989 Dependence on other enabling machines and devices: Secondary | ICD-10-CM

## 2015-07-10 DIAGNOSIS — N62 Hypertrophy of breast: Secondary | ICD-10-CM

## 2015-07-10 DIAGNOSIS — G4733 Obstructive sleep apnea (adult) (pediatric): Secondary | ICD-10-CM

## 2015-07-10 DIAGNOSIS — M545 Low back pain, unspecified: Secondary | ICD-10-CM

## 2015-07-10 NOTE — Progress Notes (Signed)
   Subjective:    Patient ID: Jennifer Erickson, female    DOB: 01/30/1971, 45 y.o.   MRN: [id number]  HPI  Pt is a 45 yo female who presents to the clinic with palpitations at rest that started about 3 weeks ago. She has never had anything like this before. She just finished phentermine and concerned about it causing palpitations. Does not drink a lot of caffiene. She denies any CP, SOB. No symptoms with exertion. Occur mostly at rest for about 30 seconds to 1 minute. Happening more frequently. She is very tired but not using CPAP. Taking vitamin D. She works at night and gets about 3-4 hours of sleep a night.   Review of Systems  All other systems reviewed and are negative.      Objective:   Physical Exam  Constitutional: She is oriented to person, place, and time. She appears well-developed and well-nourished.  Obese.   HENT:  Head: Normocephalic and atraumatic.  Cardiovascular: Normal rate, regular rhythm and normal heart sounds.   Pulmonary/Chest: Effort normal and breath sounds normal.  Large breast.   Neurological: She is alert and oriented to person, place, and time.  Skin: Skin is dry.  Psychiatric: She has a normal mood and affect. Her behavior is normal.          Assessment & Plan:  Palpitations-NSR no arrhthymias, no ST elevation or depression, no acute changes from last EKG.  Could be some lingering side effects of coming off phentermine. Gave HO on other causes. Discussed if worsened or became more symptomatic could consider beta blocker. Will continue to monitor.   OSA on CPAP- encouraged patient to start using regularly. This can help energy.   Obese/large breast/right low back pain/midline back pain- needs breast reduction. Referral made to plastics. Certainly reduction would help with weight loss and overall health.

## 2015-07-10 NOTE — Patient Instructions (Addendum)
Will place referral for breast reduction.  Consider holter monitor. Premature Ventricular Contraction A premature ventricular contraction is an irregularity in the normal heart rhythm. These contractions are extra heartbeats that occur too early in the normal sequence. In most cases, these contractions are harmless and do not require treatment. CAUSES Premature ventricular contractions may occur without a known cause. In healthy people, the extra contractions may be caused by:  Smoking.  Drinking alcohol.  Caffeine.  Certain medicines.  Some illegal drugs.  Stress. Sometimes, changes in chemicals in the blood (electrolytes) can also cause premature ventricular contractions. They can also occur in people with heart diseases that cause a decrease in blood flow to the heart. SIGNS AND SYMPTOMS Premature ventricular contractions often do not cause any symptoms. In some cases, you may have a feeling of your heart beating fast or skipping a beat (palpitations). DIAGNOSIS Your health care provider will take your medical history and do a physical exam. During the exam, the health care provider will check for irregular heartbeats. Various tests may be done to help diagnose premature ventricular contractions. These tests may include:  An ECG (electrocardiogram) to monitor the electrical activity of your heart.  Holter monitor testing. A Holter monitor is a portable device that can monitor the electrical activity of your heart over longer periods of time.  Stress tests to see how exercise affects your heart rhythm.  Echocardiogram. This test uses sound waves (ultrasound) to produce an image of your heart.  Electrophysiology study. This is used to evaluate the electrical conduction system of your heart. TREATMENT Usually, no treatment is needed. You may be advised to avoid things that can trigger the premature contractions, such as caffeine or alcohol. Medicines are sometimes given if symptoms  are severe or if the extra heartbeats are very frequent. Treatment may also be needed for an underlying cause of the contractions if one is found. HOME CARE INSTRUCTIONS  Take medicines only as directed by your health care provider.  Make any lifestyle changes recommended by your health care provider. These may include:  Quitting smoking.  Avoiding or limiting caffeine or alcohol.  Exercising. Talk to your health care provider about what type of exercise is safe for you.  Trying to reduce stress.  Keep all follow-up visits with your health care provider. This is important. SEEK IMMEDIATE MEDICAL CARE IF:  You feel palpitations that are frequent or continual.  You have chest pain.  You have shortness of breath.  You have sweating for no reason.  You have nausea and vomiting.  You become light-headed or faint.   This information is not intended to replace advice given to you by your health care provider. Make sure you discuss any questions you have with your health care provider.   Document Released: 11/02/2003 Document Revised: 04/07/2014 Document Reviewed: 08/18/2013 Elsevier Interactive Patient Education [employer].

## 2015-07-11 DIAGNOSIS — R002 Palpitations: Secondary | ICD-10-CM | POA: Insufficient documentation

## 2015-07-17 ENCOUNTER — Telehealth: Payer: Self-pay | Admitting: *Deleted

## 2015-07-17 ENCOUNTER — Ambulatory Visit: Payer: Federal, State, Local not specified - PPO | Admitting: Physician Assistant

## 2015-07-17 DIAGNOSIS — R002 Palpitations: Secondary | ICD-10-CM

## 2015-07-17 NOTE — Telephone Encounter (Signed)
Holter monitor ordered.

## 2015-07-24 ENCOUNTER — Ambulatory Visit (INDEPENDENT_AMBULATORY_CARE_PROVIDER_SITE_OTHER): Payer: Federal, State, Local not specified - PPO

## 2015-07-24 DIAGNOSIS — R002 Palpitations: Secondary | ICD-10-CM | POA: Diagnosis not present

## 2015-07-26 ENCOUNTER — Telehealth: Payer: Self-pay | Admitting: *Deleted

## 2015-07-26 NOTE — Telephone Encounter (Signed)
Spoke with pt today regarding the recall on epipens.  She wants to check the lot number on hers first before we send anything else in.  She also c/o her cpap mask not fitting right.  Advised her that I would reach out to [place] from [place] to see what needs to be done.

## 2015-07-30 DIAGNOSIS — E669 Obesity, unspecified: Secondary | ICD-10-CM | POA: Diagnosis not present

## 2015-08-03 ENCOUNTER — Encounter: Payer: Self-pay | Admitting: Physician Assistant

## 2015-08-03 DIAGNOSIS — I493 Ventricular premature depolarization: Secondary | ICD-10-CM | POA: Insufficient documentation

## 2015-08-20 DIAGNOSIS — E669 Obesity, unspecified: Secondary | ICD-10-CM | POA: Diagnosis not present

## 2015-09-06 ENCOUNTER — Encounter: Payer: Self-pay | Admitting: Osteopathic Medicine

## 2015-09-06 ENCOUNTER — Ambulatory Visit (INDEPENDENT_AMBULATORY_CARE_PROVIDER_SITE_OTHER): Payer: Federal, State, Local not specified - PPO | Admitting: Osteopathic Medicine

## 2015-09-06 VITALS — BP 139/93 | HR 99 | Temp 98.1°F | Wt 275.0 lb

## 2015-09-06 DIAGNOSIS — R059 Cough, unspecified: Secondary | ICD-10-CM

## 2015-09-06 DIAGNOSIS — B349 Viral infection, unspecified: Secondary | ICD-10-CM

## 2015-09-06 DIAGNOSIS — R05 Cough: Secondary | ICD-10-CM

## 2015-09-06 DIAGNOSIS — J988 Other specified respiratory disorders: Secondary | ICD-10-CM

## 2015-09-06 DIAGNOSIS — B9789 Other viral agents as the cause of diseases classified elsewhere: Secondary | ICD-10-CM

## 2015-09-06 MED ORDER — METHYLPREDNISOLONE 4 MG PO TBPK: ORAL_TABLET | ORAL | Status: DC

## 2015-09-06 MED ORDER — HYDROCODONE-HOMATROPINE 5-1.5 MG/5ML PO SYRP: 5.0000 mL | ORAL_SOLUTION | Freq: Four times a day (QID) | ORAL | Status: DC | PRN

## 2015-09-06 MED ORDER — AZITHROMYCIN 250 MG PO TABS: ORAL_TABLET | ORAL | Status: DC

## 2015-09-06 MED FILL — AZITHROMYCIN 250 MG TABLET: 250 | 5 days supply | Qty: 6 | Fill #0

## 2015-09-06 MED FILL — HYDROCODONE-HOMATROPINE SYR: 5-1.5 | 6 days supply | Qty: 118 | Fill #0

## 2015-09-06 MED FILL — METHYLPREDNISOLONE 4 MG TAB: 4 | 6 days supply | Qty: 21 | Fill #0

## 2015-09-06 NOTE — Progress Notes (Signed)
HPI: Jennifer Erickson is a 45 y.o. female who presents to [facility]  today for chief complaint of:  Chief Complaint  Patient presents with  . Cough    . Context:  persstent coughing . Quality: dry occasional mucus . Assoc signs/symptoms: see ROS . Duration: 5-7 days    Past medical, social and family history reviewed. Current medications and allergies reviewed.     Review of Systems: CONSTITUTIONAL: no fever/chills HEAD/EYES/EARS/NOSE/THROAT: sinus headache, no vision change or hearing change, no sore throat CARDIAC: No chest pain, No pressure/palpitations RESPIRATORY: yes cough, no shortness of breath GASTROINTESTINAL: no nausea, no vomiting, no abdominal pain, no diarrhea MUSCULOSKELETAL: no myalgia/arthralgia SKIN: no rash   Exam:  BP 139/93 mmHg  Pulse 99  Temp(Src) 98.1 F (36.7 C) (Oral)  Wt 275 lb (124.739 kg) Constitutional: VSS, see above. General Appearance: alert, well-developed, well-nourished, NAD Eyes: Normal lids and conjunctive, non-icteric sclera, PERRLA Ears, Nose, Mouth, Throat: Normal external inspection ears/nares/mouth/lips/gums, normal TM, MMM;       posterior pharynx without erythema, without exudate, nasal mucosa normal Neck: No masses, trachea midline. normal lymph nodes Respiratory: Normal respiratory effort. No  wheeze/rhonchi/rales Cardiovascular: S1/S2 normal, no murmur/rub/gallop auscultated. RRR.   No results found for this or any previous visit (from the past 72 hour(s)). No results found.   ASSESSMENT/PLAN: Fill abx if no better 10 days after symptoms began, most likely viral cough, lung exam no concerns, no sinus symptoms, likely viral bronchitis, advised cough can linger but should be improving overall, if not or if worse may consider CXR or other w/u  Cough - Plan: methylPREDNISolone (MEDROL DOSEPAK) 4 MG TBPK tablet, HYDROcodone-homatropine (HYCODAN) 5-1.5 MG/5ML syrup, azithromycin  (ZITHROMAX) 250 MG tablet  Viral respiratory illness - Plan: methylPREDNISolone (MEDROL DOSEPAK) 4 MG TBPK tablet, HYDROcodone-homatropine (HYCODAN) 5-1.5 MG/5ML syrup    Visit summary was printed for the patient with medications and pertinent instructions for patient to review. ER/RTC precautions reviewed. All questions answered. Return if symptoms worsen or fail to improve.

## 2015-09-06 NOTE — Patient Instructions (Signed)
DR. Redgie Grayer HOME CARE INSTRUCTIONS: VIRAL ILLNESSES - ACHES/PAINS, SORE THROAT, SINUSITIS, COUGH, GASTRITIS   FRIST, A FEW NOTES ON OVER-THE-COUNTER MEDICATIONS!   USE CAUTION - MANY OVER-THE-COUNTER MEDICATIONS COME IN COMBINATIONS OF MULTIPLE GENERIC MEDICINES. FOR INSTANCE, NYQUIL HAS TYLENOL + COUGH MEDICINE + AN ANTIHISTAMINE, SO BE CAREFUL YOU'RE NOT TAKING A COMBINATION MEDICINE WHICH CONTAINS MEDICATIONS YOU'RE ALSO TAKING SEPARATELY (LIKE NYQUIL SYRUP PLUS TYLENOL PILLS).   YOUR PHARMACIST CAN HELP YOU AVOID MEDICATION INTERACTIONS AND DUPLICATIONS - ASK FOR THEIR HELP IF YOU ARE CONFUSED OR UNSURE ABOUT WHAT TO PURCHASE OVER-THE-COUNTER!   REMEMBER - IF YOU'RE EVER CONCERNED ABOUT MEDICATION SIDE EFFECTS, OR IF YOU'RE EVER CONCERNED YOUR SYMPTOMS ARE GETTING WORSE DESPITE TREATMENT, PLEASE CALL THE DOCTOR'S OFFICE! IF AFTER-HOURS, YOU CAN BE SEEN IN URGENT CARE OR, FOR SEVERE ILLNESS, PLEASE GO TO THE EMERGENCY ROOM.   TREAT SINUS CONGESTION, RUNNY NOSE & POSTNASAL DRIP:  Treat to increase airflow through sinuses, decrease congestion pain and prevent bacterial growth!   Remember, only 0.5-2% of sinus infections are due to a bacteria, the rest are due to a virus (usually the common cold) which will not get better with antibiotics!  NASAL SPRAYS: generally safe and should not interact with other medicines. Can take any of these medications, either alone or together... FLONASE (FLUTICASONE) - 2 sprays in each nostril twice per day (also a great allergy medicine to use long-term!) AFRIN (OXYMETOLAZONE) - use sparingly because it will cause rebound congestion, NEVER USE IN KIDS  SALINE NASAL SPRAY- no limit, but avoid use after other nasal sprays or it can wash the medicine away PRESCRTIPTION ATROVENT - as directed on prescription bottle  ANTIHISTAMINES: Helps dry out runny nose and decreases postnasal drip. Benadryl may cause drowsiness but other preparations should not be  as sedating. Certain kinds are not as safe in elderly individuals. OK to use unless Dr A says otherwise.  Only use one of the following... BENADRYL (DIPHENHYDRAMINE) - 25-50 mg every 6 hours ZYRTEC (CETIRIZINE) - 5-10 mg daily CLARITIN (LORATIDINE) - less potent. 10 mg daily ALLEGRA (FEXOFENADINE) - least likely to cause drowsiness! 180 mg daily or 60 mg twice per day  DECONGESTANTS: Helps dry out runny nose and helps with sinus pain. May cause insomnia, or sometimes elevated heart rate. Can cause problems if used often in people with high blood pressure. OK to use unless Dr A says otherwise. NEVER USE IN KIDS UNDER 23 YEARS OLD. Only use one of the following... SUDAFED (PSEUDOEPHEDINE) - 60 mg every 4 - 6 hours, also comes in 120 mg extended release every 12 hours, maximum 240 mg in 24 hours.  SUDAED PE (PHENYLEPHRINE) - 10 mg every 4 - 6 hours, maximum 60 mg per day  COMBINATIONS OF ABOVE ANTIHISTAMINES + DECONGESTANTS: these usually require you to show your ID at the pharmacy counter. You can also purchase these medicines separately as noted above.  Only use one of the following... ZYRTEC-D (CETIRIZINE + PSEUDOEPHEDRINE) - 12 hour formulation as directed CLARITIN-D (LORATIDINE + PSEUDOEPHEDRINE) - 12 and 24 hour formulations as directed ALLEGRA-D (FEXOFENADINE + PSEUDOEPHEDRINE) - 12 and 24 hour formulations as directed  PRESCRIPTION TREATMENT FOR SINUS PROBLEMS: Can include nasal sprays, pills, or antibiotics in the case of true bacterial infection. Not everyone needs an antibiotic but there are other medicines which will help you feel better while your body fights the infection!    TREAT COUGH & SORE THROAT:  Remember, cough is the body's way of protecting your  airways and lungs, it's a hard-wired reflex that is tough for medicines to treat!   Irritation to the airways will cause cough. This irritation is usually caused by upper airway problems like postnasal drip (treat as above)  and viral sore throat, but in severe cases can be due to lower airway problems like bronchitis or pneumonia, which a doctor can usually hear on exam of your lungs or see on an X-ray.  Sore throat is almost always due to a virus, but occasionally caused by certain strains of Strep, which requires antibiotics.   Exercise and smoking may make cough worse - take it easy while you're sick, and QUIT SMOKING!   Cough due to viral infection can linger for 2-3 weeks or so. If you're coughing longer than you think you should, or if the cough is severe, please make an appointment in the office - you may need a chest X-ray.  EXPECTORANT: Used to help clear airways, take these with PLENTY of water to help thin mucus secretions and make the mucus easier to cough up  Only use one of the following... ROBITUSSIN (DEXTROMETHORPHAN OR GUAIFENISEN depending on formulation)  MUCINEX (GUAIFENICEN) - usually longer acting  COUGH DROPS/LOZENGES: Whichever over-the-counter agent you prefer!  Here are some suggestions for ingredients to look for (can take both)... BENZOCAINE - numbing effect, also in [place] - cooling effect  HONEY: has gone head-to-head in several clinical trials with prescription cough medicines and found to be equally effective! Try 1 Teaspoon Honey + 2 Drops Lemon Juice, as much as you want to use. NONE FOR KIDS UNDER AGE 41  HERBAL TEA: There are certain ingredients which help "coat the throat" to relieve pain  such as ELM BARK, LICORICE ROOT, MARSHMALLOW ROOT  PRESCRIPTION TREATMENT FOR COUGH: Reserved for severe cases. Can include pills, syrups or inhalers.    TREAT ACHES & PAINS, FEVER:  Illness causes aches, pains and fever as your body increases its natural inflammation response to help fight the infection.   Rest, good hydration and nutrition, and taking anti-inflammatory medications will help.   Remember: a true fever is a temperature 100.4 or  higher. If you have a fever that is 105.0 or higher, that is a dangerous level and needs medical attention in the office or in the ER!  Can take both of these together if it's ok with your doctor... IBUPROFEN - 400 mg every 6 - 8 hours. Ibuprofen and similar medications can cause problems for people with heart disease or history of stomach ulcers, check with Dr A first if you're concerned. Lower doses are usually safe and effective.  TYLENOL (ACETAMINOPHEN) - [phone] mg every 6 hours. It won't cause problems with heart or stomach.     TREAT GASTROINTESTINAL SYMPTOMS:  Hydrate, hydrate, hydrate! Try drinking Gatorade/Powerade, broth/soup. Avoid milk and juice, these can make diarrhea worse. You can drink water, of course, but if you are also having vomiting and loose stool you are losing electrolytes which water alone can't replace.  IMMODIUM (LOPERAMIDE) - as directed on the bottle to help with loose stool PRESCRIPTION ZOFRAN (ONDANSETRON) OR PHENERGAN (PROMETHAZINE) - as directed to help nausea and vomiting. Try taking these before you eat if you are having trouble keeping food down.     REMEMBER - THE MOST IMPORTANT THINGS YOU CAN DO TO AVOID CATCHING OR SPREADING ILLNESS INCLUDE:   COVER YOUR COUGH WITH YOUR ARM, NOT WITH YOUR HANDS!   DISINFECT COMMONLY USED SURFACES (SUCH AS  TELEPHONES & DOORKNOBS) WHEN YOU OR SOMEONE CLOSE TO YOU IS FEELING SICK!   BE SURE VACCINES ARE UP TO DATE - GET A FLU SHOT EVERY YEAR! THE FLU CAN BE DEADLY FOR BABIES, ELDERLY FOLKS, AND PEOPLE WITH WEAK IMMUNE SYSTEMS - YOU SHOULD BE VACCINATED TO HELP PREVENT YOURSELF FROM GETTING SICK, BUT ALSO TO PREVENT SOMEONE ELSE FROM GETTING AN INFECTION WHICH MAY HOSPITALIZE OR KILL THEM.  GOOD NUTRITION AND HEALTHY LIFESTYLE WILL HELP YOUR IMMUNE SYSTEM YEAR-ROUND! THERE IS NO MAGIC SUPPLEMENT!  AND ABOVE ALL - [place] YOUR HANDS OFTEN!

## 2015-09-26 ENCOUNTER — Ambulatory Visit (INDEPENDENT_AMBULATORY_CARE_PROVIDER_SITE_OTHER): Payer: Federal, State, Local not specified - PPO | Admitting: Physician Assistant

## 2015-09-26 ENCOUNTER — Encounter: Payer: Self-pay | Admitting: Physician Assistant

## 2015-09-26 VITALS — BP 121/80 | HR 95 | Ht 66.0 in | Wt 276.0 lb

## 2015-09-26 DIAGNOSIS — J329 Chronic sinusitis, unspecified: Secondary | ICD-10-CM | POA: Diagnosis not present

## 2015-09-26 DIAGNOSIS — A499 Bacterial infection, unspecified: Secondary | ICD-10-CM

## 2015-09-26 DIAGNOSIS — M79644 Pain in right finger(s): Secondary | ICD-10-CM

## 2015-09-26 DIAGNOSIS — B9689 Other specified bacterial agents as the cause of diseases classified elsewhere: Secondary | ICD-10-CM

## 2015-09-26 MED ORDER — PREDNISONE 50 MG PO TABS: ORAL_TABLET | ORAL | Status: DC

## 2015-09-26 MED ORDER — ALBUTEROL SULFATE HFA 108 (90 BASE) MCG/ACT IN AERS: 2.0000 | INHALATION_SPRAY | Freq: Four times a day (QID) | RESPIRATORY_TRACT | Status: DC | PRN

## 2015-09-26 MED ORDER — AMOXICILLIN-POT CLAVULANATE 875-125 MG PO TABS: 1.0000 | ORAL_TABLET | Freq: Two times a day (BID) | ORAL | Status: DC

## 2015-09-26 MED FILL — predniSONE 50 MG TABS: 50 | 5 days supply | Qty: 5 | Fill #0

## 2015-09-26 MED FILL — AMOX-CLAV 875-125 MG TABLET: 875-125 | 10 days supply | Qty: 20 | Fill #0

## 2015-09-26 MED FILL — VENTOLIN HFA 90 MCG INHALER: 108 (90 BAS | 30 days supply | Qty: 18 | Fill #0

## 2015-09-26 NOTE — Progress Notes (Signed)
   Subjective:    Patient ID: Jennifer Erickson, female    DOB: 06-29-1970, 45 y.o.   MRN: [id number]  HPI Pt is a 45 yo female who presents to the clinic with 2 1/2 weeks of cough, headache, sinus pressure,sinus drainage. She was seen on 09/06/15. She was told it was viral and given medrol dose pak. Her symptoms are persistent. No fever or chills. Cough is productive. Using atrovent.   She also questions right pinky pain and mobile nodule only when her fingers get cold. 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 She has not done anything about it since symptoms are sporadic. When finger gets cold tip of right pinky finger goes numb and very painful. Only thing that helps is putting it in a cup of warm water.    Review of Systems  All other systems reviewed and are negative.      Objective:   Physical Exam  Constitutional: She is oriented to person, place, and time. She appears well-developed and well-nourished.  HENT:  Head: Normocephalic and atraumatic.  Right Ear: External ear normal.  Left Ear: External ear normal.  TM's clear.  Sinus drainage down oropharynx.  Bilateral swollen nasal turbinates.  Tenderness over bilateral sinuses to palpation.   Eyes: Conjunctivae are normal. Right eye exhibits no discharge. Left eye exhibits no discharge.  Neck: Normal range of motion. Neck supple.  Cardiovascular: Normal rate, regular rhythm and normal heart sounds.   Pulmonary/Chest: Effort normal and breath sounds normal. She has no wheezes.  Musculoskeletal:  No swelling, tenderness, or nodule palpated over right 5th metatarsal. Normal ROM.   Lymphadenopathy:    She has no cervical adenopathy.  Neurological: She is alert and oriented to person, place, and time.  Psychiatric: She has a normal mood and affect. Her behavior is normal.          Assessment & Plan:  Bacterial sinusitis- treated with augmentin for 10 days. Prednisone burst. Albuterol as needed for cough and SoB.  Follow up if not improving.   Right 5th metatarsal pain- no nodule palpated today. Get records from previous provider and follow up with Dr. Darene Lamer.

## 2015-11-27 ENCOUNTER — Ambulatory Visit (INDEPENDENT_AMBULATORY_CARE_PROVIDER_SITE_OTHER): Payer: Federal, State, Local not specified - PPO | Admitting: Physician Assistant

## 2015-11-27 ENCOUNTER — Encounter: Payer: Self-pay | Admitting: Physician Assistant

## 2015-11-27 VITALS — BP 144/91 | HR 93 | Wt 282.0 lb

## 2015-11-27 DIAGNOSIS — R252 Cramp and spasm: Secondary | ICD-10-CM

## 2015-11-27 NOTE — Patient Instructions (Addendum)
Wrap warm towel around muscle 2-3 times a day and coat biofreeze.  Stretches for hamstring.  Aleve 2 tablets twice a day for 3-5 days.  Magnesium 564m daily.    Muscle Cramps and Spasms Muscle cramps and spasms occur when a muscle or muscles tighten and you have no control over this tightening (involuntary muscle contraction). They are a common problem and can develop in any muscle. The most common place is in the calf muscles of the leg. Both muscle cramps and muscle spasms are involuntary muscle contractions, but they also have differences:   Muscle cramps are sporadic and painful. They may last a few seconds to a quarter of an hour. Muscle cramps are often more forceful and last longer than muscle spasms.  Muscle spasms may or may not be painful. They may also last just a few seconds or much longer. CAUSES  It is uncommon for cramps or spasms to be due to a serious underlying problem. In many cases, the cause of cramps or spasms is unknown. Some common causes are:   Overexertion.   Overuse from repetitive motions (doing the same thing over and over).   Remaining in a certain position for a long period of time.   Improper preparation, form, or technique while performing a sport or activity.   Dehydration.   Injury.   Side effects of some medicines.   Abnormally low levels of the salts and ions in your blood (electrolytes), especially potassium and calcium. This could happen if you are taking water pills (diuretics) or you are pregnant.  Some underlying medical problems can make it more likely to develop cramps or spasms. These include, but are not limited to:   Diabetes.   Parkinson disease.   Hormone disorders, such as thyroid problems.   Alcohol abuse.   Diseases specific to muscles, joints, and bones.   Blood vessel disease where not enough blood is getting to the muscles.  HOME CARE INSTRUCTIONS   Stay well hydrated. Drink enough water and fluids to  keep your urine clear or pale yellow.  It may be helpful to massage, stretch, and relax the affected muscle.  For tight or tense muscles, use a warm towel, heating pad, or hot shower water directed to the affected area.  If you are sore or have pain after a cramp or spasm, applying ice to the affected area may relieve discomfort.  Put ice in a plastic bag.  Place a towel between your skin and the bag.  Leave the ice on for 15-20 minutes, 03-04 times a day.  Medicines used to treat a known cause of cramps or spasms may help reduce their frequency or severity. Only take over-the-counter or prescription medicines as directed by your caregiver. SEEK MEDICAL CARE IF:  Your cramps or spasms get more severe, more frequent, or do not improve over time.  MAKE SURE YOU:   Understand these instructions.  Will watch your condition.  Will get help right away if you are not doing well or get worse.   This information is not intended to replace advice given to you by your health care provider. Make sure you discuss any questions you have with your health care provider.   Document Released: 09/06/2001 Document Revised: 07/12/2012 Document Reviewed: 03/03/2012 Elsevier Interactive Patient Education [employer]

## 2015-11-27 NOTE — Progress Notes (Signed)
   Subjective:    Patient ID: Jennifer Erickson, female    DOB: 02-Feb-1971, 45 y.o.   MRN: [id number]  HPI  Patient is a 45 year old female who presents to the the clinic with left lower calf tightness, cramping, pain. The symptoms have been going on for last 2 weeks. She denies any injury. She is working on hard new floors at work. She is also required to walk more at work. She feels like walking makes this better and sitting makes worse. Her right foot has been swollen lately due to plantar fasciitis. She believes she is over compensating with walking different with left.     Review of Systems See HPI.     Objective:   Physical Exam  Constitutional: She is oriented to person, place, and time. She appears well-developed and well-nourished.  HENT:  Head: Normocephalic and atraumatic.  Cardiovascular: Normal rate, regular rhythm and normal heart sounds.   Pulmonary/Chest: Effort normal and breath sounds normal. She has no wheezes.  Musculoskeletal:  Left lower calf tenderness and tightness posterior. No swelling, redness or edema. Full ROM at knee.  Tenderness with heel lift, left side.   Neurological: She is alert and oriented to person, place, and time.  Psychiatric: She has a normal mood and affect. Her behavior is normal.          Assessment & Plan:  Cramps of lower extremity- unclear etiology. Order D-dimer to make sure no blood clot. cmp to look at electrolytes.  Make sure staying hydrated.  Discussed symptomatic care with warm compresses and biofreeze over muscles.  Stretches given for hamstring strain.  Aleve 2 tablets bid for 3-5 days.  Start magnesium.  Follow up as needed.

## 2015-11-28 DIAGNOSIS — R252 Cramp and spasm: Secondary | ICD-10-CM | POA: Insufficient documentation

## 2015-11-28 LAB — BASIC METABOLIC PANEL
BUN: 20 mg/dL (ref 7–25)
CO2: 22 mmol/L (ref 20–31)
CREATININE: 0.8 mg/dL (ref 0.50–1.10)
Calcium: 9.1 mg/dL (ref 8.6–10.2)
Chloride: 104 mmol/L (ref 98–110)
Glucose, Bld: 113 mg/dL — ABNORMAL HIGH (ref 65–99)
POTASSIUM: 4.4 mmol/L (ref 3.5–5.3)
Sodium: 138 mmol/L (ref 135–146)

## 2015-11-28 LAB — D-DIMER, QUANTITATIVE (NOT AT ARMC): D DIMER QUANT: 0.24 ug{FEU}/mL (ref <0.50)

## 2015-12-12 ENCOUNTER — Ambulatory Visit (INDEPENDENT_AMBULATORY_CARE_PROVIDER_SITE_OTHER): Payer: Federal, State, Local not specified - PPO | Admitting: Physician Assistant

## 2015-12-12 ENCOUNTER — Encounter: Payer: Self-pay | Admitting: Physician Assistant

## 2015-12-12 VITALS — BP 129/83 | HR 106 | Temp 98.5°F | Ht 66.0 in | Wt 277.0 lb

## 2015-12-12 DIAGNOSIS — J329 Chronic sinusitis, unspecified: Secondary | ICD-10-CM

## 2015-12-12 DIAGNOSIS — A499 Bacterial infection, unspecified: Secondary | ICD-10-CM

## 2015-12-12 DIAGNOSIS — B9689 Other specified bacterial agents as the cause of diseases classified elsewhere: Secondary | ICD-10-CM

## 2015-12-12 DIAGNOSIS — R059 Cough, unspecified: Secondary | ICD-10-CM

## 2015-12-12 DIAGNOSIS — R05 Cough: Secondary | ICD-10-CM | POA: Diagnosis not present

## 2015-12-12 MED ORDER — PREDNISONE 50 MG PO TABS: ORAL_TABLET | ORAL | 0 refills | Status: DC

## 2015-12-12 MED ORDER — AMOXICILLIN-POT CLAVULANATE 875-125 MG PO TABS: 1.0000 | ORAL_TABLET | Freq: Two times a day (BID) | ORAL | 0 refills | Status: DC

## 2015-12-12 MED FILL — predniSONE 50 MG TABS: 50 | 5 days supply | Qty: 5 | Fill #0

## 2015-12-12 MED FILL — AMOX-CLAV 875-125 MG TABLET: 875-125 | 10 days supply | Qty: 20 | Fill #0

## 2015-12-12 NOTE — Progress Notes (Signed)
   Subjective:    Patient ID: Jennifer Erickson, female    DOB: 12-09-1970, 45 y.o.   MRN: [id number]  HPI Pt is a 45 yo female who presents to the clinic with 10 days of cough, sinus pressure, nasal congestion, and fatigue. She is tried mucinex, tylenol cold and sinus, sudafed with no relief. Cough is productive with yellow to green sputum. She denies any wheezing and SOB only with exertion.   Her left leg continues to ache only when sitting. No new injury. Right plantar fasciitis is still bothering her.    Review of Systems    see HPI.  Objective:   Physical Exam  Constitutional: She is oriented to person, place, and time. She appears well-developed and well-nourished.  HENT:  Head: Normocephalic and atraumatic.  Right Ear: External ear normal.  Left Ear: External ear normal.  Mouth/Throat: Oropharynx is clear and moist. No oropharyngeal exudate.  TM's clear.  Tenderness over maxillary sinuses.  Bilateral nasal turbinates red and swollen.   Eyes: Conjunctivae are normal. Right eye exhibits no discharge. Left eye exhibits no discharge.  Neck: Normal range of motion. Neck supple.  Cardiovascular: Normal rate, regular rhythm and normal heart sounds.   Pulmonary/Chest: Effort normal and breath sounds normal. She has no wheezes.  Lymphadenopathy:    She has no cervical adenopathy.  Neurological: She is alert and oriented to person, place, and time.  Psychiatric: She has a normal mood and affect. Her behavior is normal.          Assessment & Plan:  Bacterial sinusitis/cough- treated with augmentin and prednisone for 5 days. Symptomatic care discussed with rest and hydration. Delsym for cough.    Cramps of lower extremity/plantar fasciitis of right foot- still think it could be some compensation from plantar fasciitis of right foot. Follow up with sports medicine. Discussed ice, NSAID, consider PT.

## 2015-12-12 NOTE — Patient Instructions (Signed)

## 2015-12-19 ENCOUNTER — Emergency Department
Admission: EM | Admit: 2015-12-19 | Discharge: 2015-12-19 | Disposition: A | Discharge status: Home / Self Care | Payer: Federal, State, Local not specified - PPO | Source: Home / Self Care | Attending: Family Medicine | Admitting: Family Medicine

## 2015-12-19 ENCOUNTER — Telehealth: Payer: Self-pay | Admitting: Family Medicine

## 2015-12-19 ENCOUNTER — Emergency Department (INDEPENDENT_AMBULATORY_CARE_PROVIDER_SITE_OTHER): Payer: Federal, State, Local not specified - PPO

## 2015-12-19 ENCOUNTER — Encounter: Payer: Self-pay | Admitting: *Deleted

## 2015-12-19 DIAGNOSIS — R0981 Nasal congestion: Secondary | ICD-10-CM

## 2015-12-19 MED ORDER — AZITHROMYCIN 250 MG PO TABS
ORAL_TABLET | ORAL | 0 refills | Status: DC
Start: 2015-12-19 — End: 2016-02-11

## 2015-12-19 MED ORDER — PREDNISONE 20 MG PO TABS: ORAL_TABLET | ORAL | 0 refills | Status: DC

## 2015-12-19 MED ORDER — METHYLPREDNISOLONE SODIUM SUCC 125 MG IJ SOLR
125.0000 mg | Freq: Once | INTRAMUSCULAR | Status: AC
  Administered 2015-12-19: 125 mg via INTRAMUSCULAR

## 2015-12-19 NOTE — ED Triage Notes (Signed)
Pt c/o nasal congestion, sinus HA, yellow nasal d/c, and productive cough x 3 wks. She is taking Amoxicillin from [facility] which is causing diarrhea.

## 2015-12-19 NOTE — ED Provider Notes (Signed)
[name]    CSN: [id number] Arrival date & time: 12/19/15  1928  First Provider Contact:  First MD Initiated Contact with Patient 12/19/15 2004        History   Chief Complaint Chief Complaint  Patient presents with  . Nasal Congestion    HPI Jennifer Erickson is a 45 y.o. female.   Patient has had persistent sinus congestion for about 3 weeks, and recently has developed increased cough.  No pleuritic pain or shortness of breath.  No fever.  One week ago she started a 10 day course of Augmentin which caused diarrhea so she discontinued it.  No abdominal pain.  No hematochezia.  She notes that her left ear feels clogged.   The history is provided by the patient.    Past Medical History:  Diagnosis Date  . Personal history of amenorrhea   . Sinusitis     Patient Active Problem List   Diagnosis Date Noted  . Cramps of left lower extremity 11/28/2015  . 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 Palpitations 07/11/2015  . OSA on CPAP 07/10/2015  . Large breasts 05/28/2015  . Lateral epicondylitis (tennis elbow) 05/28/2015  . Right-sided low back pain without sciatica 05/07/2015  . Primary osteoarthritis of both knees 12/08/2014  . Flank pain 11/01/2014  . Vitamin D deficiency 04/19/2014  . Bilateral low back pain without sciatica 01/25/2014  . Bilateral swelling of feet 05/02/2013  . Left breast mass 11/22/2012  . Obstructive sleep apnea 11/22/2012  . Plantar fasciitis, bilateral 11/01/2012  . Dermatitis 05/26/2012  . Uvular swelling 12/30/2011  . Insomnia 12/30/2011  . Obesity, Class III, BMI 40-49.9 (morbid obesity) ([place]) 08/19/2011    Past Surgical History:  Procedure Laterality Date  . DILATION AND CURETTAGE OF UTERUS    . TONSILLECTOMY      OB History    No data available       Home Medications    Prior to Admission medications   Medication Sig Start Date End Date Taking? Authorizing Provider  etonogestrel (IMPLANON) 68 MG IMPL implant  Inject 1 each into the skin once.   Yes Historical Provider, MD  azithromycin (ZITHROMAX Z-PAK) 250 MG tablet Take 2 tabs today; then begin one tab once daily for 4 more days. (Rx void after 12/27/15) 12/19/15   Kandra Nicolas, MD  predniSONE (DELTASONE) 20 MG tablet Take one tab by mouth twice daily for 5 days, then one daily for 3 days. Take with food. 12/19/15   Kandra Nicolas, MD    Family History Family History  Problem Relation Age of Onset  . Cancer Mother   . Hypertension Father   . Diabetes Brother     Social History Social History  Substance Use Topics  . Smoking status: Never Smoker  . Smokeless tobacco: Never Used  . Alcohol use No     Allergies   Aspirin; Ciprofloxacin; Hydrocodone; and Ivp dye [iodinated diagnostic agents]   Review of Systems Review of Systems No sore throat + cough No pleuritic pain No wheezing + nasal congestion + post-nasal drainage + sinus pain/pressure No itchy/red eyes ? Earache; left ear feels clogged No hemoptysis No SOB No fever/chills No nausea No vomiting No abdominal pain No diarrhea No urinary symptoms No skin rash No fatigue No myalgias + headache Used OTC meds without relief   Physical Exam Triage Vital Signs ED Triage Vitals  Enc Vitals Group     BP 12/19/15 1942 135/97  Pulse Rate 12/19/15 1942 96     Resp 12/19/15 1942 16     Temp 12/19/15 1942 98.2 F (36.8 C)     Temp Source 12/19/15 1942 Oral     SpO2 12/19/15 1942 99 %     Weight 12/19/15 1942 278 lb (126.1 kg)     Height 12/19/15 1942 5' 6"  (1.676 m)     Head Circumference --      Peak Flow --      Pain Score 12/19/15 1943 0     Pain Loc --      Pain Edu? --      Excl. in [place]? --    No data found.   Updated Vital Signs BP 135/97 (BP Location: Left Arm)   Pulse 96   Temp 98.2 F (36.8 C) (Oral)   Resp 16   Ht 5' 6"  (1.676 m)   Wt 278 lb (126.1 kg)   SpO2 99%   BMI 44.87 kg/m   Visual Acuity Right Eye Distance:   Left Eye  Distance:   Bilateral Distance:    Right Eye Near:   Left Eye Near:    Bilateral Near:     Physical Exam Nursing notes and Vital Signs reviewed. Appearance:  Patient appears stated age, and in no acute distress Eyes:  Pupils are equal, round, and reactive to light and accomodation.  Extraocular movement is intact.  Conjunctivae are not inflamed  Ears:  Canals normal.  Left tympanic membrane normal; right tympanic membrane slightly bulging. Nose:   Congested turbinates.  No sinus tenderness.    Pharynx:  Normal Neck:  Supple.  Posterior/lateral nodes are enlarged. Lungs:  Clear to auscultation.  Breath sounds are equal.  Moving air well. Heart:  Regular rate and rhythm without murmurs, rubs, or gallops.  Abdomen:  Nontender without masses or hepatosplenomegaly.  Bowel sounds are present.  No CVA or flank tenderness.  Extremities:  No edema.  Skin:  No rash present.    UC Treatments / Results  Labs (all labs ordered are listed, but only abnormal results are displayed)  Labs Reviewed -  Tympanometry:  Right ear tympanogram normal; Left ear tympanogram normal   EKG  EKG Interpretation None       Radiology Dg Sinuses Complete  Result Date: 12/19/2015 CLINICAL DATA:  Nasal congestion, sinus pressure in the frontal sinuses, nasal discharge, and productive cough for 3 weeks. Currently taking amoxicillin. EXAM: PARANASAL SINUSES - COMPLETE 3 + VIEW COMPARISON:  None. FINDINGS: The paranasal sinus are aerated. There is no evidence of sinus opacification air-fluid levels or mucosal thickening. No significant bone abnormalities are seen. IMPRESSION: The paranasal sinuses appear clear. Electronically Signed   By: Lucienne Capers M.D.   On: 12/19/2015 21:34    Procedures Procedures (including critical care time)  Medications Ordered in UC Medications  methylPREDNISolone sodium succinate (SOLU-MEDROL) 125 mg/2 mL injection 125 mg (125 mg Intramuscular Given 12/19/15 2146)      Initial Impression / Assessment and Plan / UC Course  I have reviewed the triage vital signs and the nursing notes.  Pertinent labs & imaging results that were available during my care of the patient were reviewed by me and considered in my medical decision making (see chart for details).  Clinical Course  There is no evidence of bacterial infection today.  Suspect post-infectious stage of viral URI. Administered Solumedrol 170m IM Begin prednisone burst/taper on Thursday 12/20/15. Take plain guaifenesin (12020mextended release tabs such as Mucinex)  twice daily, with plenty of water, for cough and congestion.  May add Pseudoephedrine (46m, one or two every 4 to 6 hours) for sinus congestion.  Get adequate rest.   May use Afrin nasal spray (or generic oxymetazoline) twice daily for about 5 days and then discontinue.  Also recommend using saline nasal spray several times daily and saline nasal irrigation (AYR is a common brand).  Use Flonase nasal spray each morning after using Afrin nasal spray and saline nasal irrigation. Try warm salt water gargles for sore throat.  Stop all antihistamines for now, and other non-prescription cough/cold preparations.   Begin Azithromycin if not improving about one week or if persistent fever develops (Given a prescription to hold, with an expiration date)  Follow-up with family doctor if not improving about10 days.      Final Clinical Impressions(s) / UC Diagnoses   Final diagnoses:  Nasal sinus congestion    New Prescriptions New Prescriptions   AZITHROMYCIN (ZITHROMAX Z-PAK) 250 MG TABLET    Take 2 tabs today; then begin one tab once daily for 4 more days. (Rx void after 12/27/15)   PREDNISONE (DELTASONE) 20 MG TABLET    Take one tab by mouth twice daily for 5 days, then one daily for 3 days. Take with food.     SKandra Nicolas MD 12/27/15 [phone]

## 2015-12-19 NOTE — Discharge Instructions (Signed)
Begin prednisone on Thursday 12/20/15. Take plain guaifenesin (1237m extended release tabs such as Mucinex) twice daily, with plenty of water, for cough and congestion.  May add Pseudoephedrine (375m one or two every 4 to 6 hours) for sinus congestion.  Get adequate rest.   May use Afrin nasal spray (or generic oxymetazoline) twice daily for about 5 days and then discontinue.  Also recommend using saline nasal spray several times daily and saline nasal irrigation (AYR is a common brand).  Use Flonase nasal spray each morning after using Afrin nasal spray and saline nasal irrigation. Try warm salt water gargles for sore throat.  Stop all antihistamines for now, and other non-prescription cough/cold preparations.   Begin Azithromycin if not improving about one week or if persistent fever develops   Follow-up with family doctor if not improving about10 days.

## 2015-12-20 NOTE — Telephone Encounter (Signed)
ENCOUNTER CREATED TO ADD TYMPANOGRAM ORDER SO RESULT CAN BE SCANNED. PERFORMED ON DOS 12/19/2015

## 2015-12-26 DIAGNOSIS — Z30017 Encounter for initial prescription of implantable subdermal contraceptive: Secondary | ICD-10-CM | POA: Diagnosis not present

## 2015-12-26 DIAGNOSIS — Z3200 Encounter for pregnancy test, result unknown: Secondary | ICD-10-CM | POA: Diagnosis not present

## 2015-12-26 DIAGNOSIS — Z3046 Encounter for surveillance of implantable subdermal contraceptive: Secondary | ICD-10-CM | POA: Diagnosis not present

## 2016-02-11 ENCOUNTER — Ambulatory Visit (INDEPENDENT_AMBULATORY_CARE_PROVIDER_SITE_OTHER): Payer: Federal, State, Local not specified - PPO

## 2016-02-11 ENCOUNTER — Ambulatory Visit (INDEPENDENT_AMBULATORY_CARE_PROVIDER_SITE_OTHER): Payer: Federal, State, Local not specified - PPO | Admitting: Sports Medicine

## 2016-02-11 ENCOUNTER — Encounter: Payer: Self-pay | Admitting: Sports Medicine

## 2016-02-11 DIAGNOSIS — G8929 Other chronic pain: Secondary | ICD-10-CM

## 2016-02-11 DIAGNOSIS — N200 Calculus of kidney: Secondary | ICD-10-CM

## 2016-02-11 DIAGNOSIS — E66813 Obesity, class 3: Secondary | ICD-10-CM

## 2016-02-11 DIAGNOSIS — M545 Low back pain: Secondary | ICD-10-CM

## 2016-02-11 DIAGNOSIS — M533 Sacrococcygeal disorders, not elsewhere classified: Secondary | ICD-10-CM

## 2016-02-11 DIAGNOSIS — M170 Bilateral primary osteoarthritis of knee: Secondary | ICD-10-CM | POA: Diagnosis not present

## 2016-02-11 DIAGNOSIS — M5136 Other intervertebral disc degeneration, lumbar region: Secondary | ICD-10-CM

## 2016-02-11 NOTE — Assessment & Plan Note (Signed)
Doing well after injection 9 months ago. Aggressive physical therapy.  Patient does not desire any medications. If she does not respond to PT we will add meloxicam and consider further injections.

## 2016-02-11 NOTE — Assessment & Plan Note (Signed)
We also discussed aggressive weight loss. She did lose some weight with phentermine, however the amount of weight she needs to lose would likely best be accomplished with bariatric surgery. She will think about this by her follow-up visit.

## 2016-02-11 NOTE — Assessment & Plan Note (Signed)
Pain is left SI joint with radiation to the left buttock. X-rays of the lumbar spine and SI joints. Aggressive formal physical therapy.  Medication at the next visit if insufficient improvement. We also discussed aggressive weight loss.

## 2016-02-11 NOTE — Progress Notes (Signed)
   Subjective:    I'm seeing this patient as a consultation for: Jennifer Planas, PA-C   CC: Knee pain, buttock pain  HPI: Primary knee osteoarthritis: We've injected her several months ago when she did extremely well, now having a recurrence of pain under the left knee, moderate, persistent, worse with squatting, going up and down stairs. No radiation, no mechanical symptoms. Does not when he tried any medication or injections today.  Left thigh pain: Localize just distal to the ischial tuberosity, worse with sitting, flexion, Valsalva, she does have some back pain as well.  Past medical history:  Negative.  See flowsheet/record as well for more information.  Surgical history: Negative.  See flowsheet/record as well for more information.  Family history: Negative.  See flowsheet/record as well for more information.  Social history: Negative.  See flowsheet/record as well for more information.  Allergies, and medications have been entered into the medical record, reviewed, and no changes needed.   Review of Systems: No headache, visual changes, nausea, vomiting, diarrhea, constipation, dizziness, abdominal pain, skin rash, fevers, chills, night sweats, weight loss, swollen lymph nodes, body aches, joint swelling, muscle aches, chest pain, shortness of breath, mood changes, visual or auditory hallucinations.   Objective:   General: Well Developed, well nourished, and in no acute distress.  Neuro/Psych: Alert and oriented x3, extra-ocular muscles intact, able to move all 4 extremities, sensation grossly intact. Skin: Warm and dry, no rashes noted.  Respiratory: Not using accessory muscles, speaking in full sentences, trachea midline.  Cardiovascular: Pulses palpable, no extremity edema. Abdomen: Does not appear distended. Left Knee: Normal to inspection with no erythema or effusion or obvious bony abnormalities. Tender to palpation under the medial and lateral patellar facets ROM normal in  flexion and extension and lower leg rotation. Ligaments with solid consistent endpoints including ACL, PCL, LCL, MCL. Negative Mcmurray's and provocative meniscal tests. Non painful patellar compression. Patellar and quadriceps tendons unremarkable. Hamstring and quadriceps strength is normal. Back Exam:  Inspection: Unremarkable  Motion: Flexion 45 deg, Extension 45 deg, Side Bending to 45 deg bilaterally,  Rotation to 45 deg bilaterally  SLR laying: Negative  XSLR laying: Negative  Palpable tenderness: Left SI joint. Specifically no pain over the ischial tuberosity FABER: negative. Sensory change: Gross sensation intact to all lumbar and sacral dermatomes.  Reflexes: 2+ at both patellar tendons, 2+ at achilles tendons, Babinski's downgoing.  Strength at foot  Plantar-flexion: 5/5 Dorsi-flexion: 5/5 Eversion: 5/5 Inversion: 5/5  Leg strength  Quad: 5/5 Hamstring: 5/5 Hip flexor: 5/5 Hip abductors: 5/5  Gait unremarkable.  Impression and Recommendations:   This case required medical decision making of moderate complexity.  Primary osteoarthritis of both knees Doing well after injection 9 months ago. Aggressive physical therapy.  Patient does not desire any medications. If she does not respond to PT we will add meloxicam and consider further injections.  Bilateral low back pain without sciatica Pain is left SI joint with radiation to the left buttock. X-rays of the lumbar spine and SI joints. Aggressive formal physical therapy.  Medication at the next visit if insufficient improvement. We also discussed aggressive weight loss.  Obesity, Class III, BMI 40-49.9 (morbid obesity) We also discussed aggressive weight loss. She did lose some weight with phentermine, however the amount of weight she needs to lose would likely best be accomplished with bariatric surgery. She will think about this by her follow-up visit.

## 2016-02-15 ENCOUNTER — Telehealth: Payer: Self-pay

## 2016-02-15 MED ORDER — PREDNISONE 50 MG PO TABS: ORAL_TABLET | ORAL | 0 refills | Status: DC

## 2016-02-15 MED ORDER — MELOXICAM 15 MG PO TABS: ORAL_TABLET | ORAL | 3 refills | Status: DC

## 2016-02-15 MED FILL — MELOXICAM 15 MG TABLET: 15 | 30 days supply | Qty: 30 | Fill #0

## 2016-02-15 MED FILL — predniSONE 50 MG TABS: 50 | 5 days supply | Qty: 5 | Fill #0

## 2016-02-15 NOTE — Telephone Encounter (Signed)
She had requested no medications at the initial visit, I am going to add prednisone and meloxicam. She needs to be aggressive with the rehabilitation exercises and physical therapy.

## 2016-02-15 NOTE — Telephone Encounter (Signed)
Pt left VM stating she's still having a lot of pain in her back and tail bone and wants to know if there is anything you can send in for the pain. Please advise.

## 2016-02-18 ENCOUNTER — Encounter: Payer: Self-pay | Admitting: Sports Medicine

## 2016-02-18 ENCOUNTER — Ambulatory Visit (INDEPENDENT_AMBULATORY_CARE_PROVIDER_SITE_OTHER): Payer: Federal, State, Local not specified - PPO | Admitting: Sports Medicine

## 2016-02-18 DIAGNOSIS — M2550 Pain in unspecified joint: Secondary | ICD-10-CM

## 2016-02-18 MED ORDER — NAPROXEN 500 MG PO TABS: 500.0000 mg | ORAL_TABLET | Freq: Two times a day (BID) | ORAL | 3 refills | Status: DC

## 2016-02-18 MED FILL — NAPROXEN 500 MG TABLET: 500 | 30 days supply | Qty: 60 | Fill #0

## 2016-02-18 NOTE — Assessment & Plan Note (Signed)
Pain in every joint per patient report. This is likely myofascial pain syndrome. She has been somewhat against using pharmacologic intervention.  At this point we are going to add naproxen 500 mg twice a day. Return to see me in 2 weeks. May also use turmeric over-the-counter.

## 2016-02-18 NOTE — Progress Notes (Addendum)
  Subjective:    CC: Pain all over  HPI: This is a pleasant 44 year old female, she comes in with complaints of pain in every joint of her body, and recurrence of her right tennis elbow. Previously she was somewhat resistant pharmacologic intervention, codeine her concerned that she will become hooked on an narcotic medication. She does report that her mood is good, she is happy.  Past medical history:  Negative.  See flowsheet/record as well for more information.  Surgical history: Negative.  See flowsheet/record as well for more information.  Family history: Negative.  See flowsheet/record as well for more information.  Social history: Negative.  See flowsheet/record as well for more information.  Allergies, and medications have been entered into the medical record, reviewed, and no changes needed.   Review of Systems: No fevers, chills, night sweats, weight loss, chest pain, or shortness of breath.   Objective:    General: Well Developed, well nourished, and in no acute distress.  Neuro: Alert and oriented x3, extra-ocular muscles intact, sensation grossly intact.  HEENT: Normocephalic, atraumatic, pupils equal round reactive to light, neck supple, no masses, no lymphadenopathy, thyroid nonpalpable.  Skin: Warm and dry, no rashes. Cardiac: Regular rate and rhythm, no murmurs rubs or gallops, no lower extremity edema.  Respiratory: Clear to auscultation bilaterally. Not using accessory muscles, speaking in full sentences.  Impression and Recommendations:    Polyarthralgia Pain in every joint per patient report. This is likely myofascial pain syndrome. She has been somewhat against using pharmacologic intervention.  At this point we are going to add naproxen 500 mg twice a day. Return to see me in 2 weeks. May also use turmeric over-the-counter.   I spent 25 minutes with this patient, greater than 50% was face-to-face time counseling regarding the above diagnoses

## 2016-02-26 ENCOUNTER — Ambulatory Visit (INDEPENDENT_AMBULATORY_CARE_PROVIDER_SITE_OTHER): Payer: Federal, State, Local not specified - PPO | Admitting: Podiatry

## 2016-02-26 ENCOUNTER — Encounter: Payer: Self-pay | Admitting: Podiatry

## 2016-02-26 ENCOUNTER — Other Ambulatory Visit: Payer: Self-pay | Admitting: *Deleted

## 2016-02-26 VITALS — BP 149/94 | HR 90 | Ht 66.0 in | Wt 274.0 lb

## 2016-02-26 DIAGNOSIS — M216X2 Other acquired deformities of left foot: Secondary | ICD-10-CM

## 2016-02-26 DIAGNOSIS — M79672 Pain in left foot: Secondary | ICD-10-CM | POA: Diagnosis not present

## 2016-02-26 DIAGNOSIS — M79671 Pain in right foot: Secondary | ICD-10-CM

## 2016-02-26 DIAGNOSIS — M722 Plantar fascial fibromatosis: Secondary | ICD-10-CM

## 2016-02-26 DIAGNOSIS — M216X1 Other acquired deformities of right foot: Secondary | ICD-10-CM

## 2016-02-26 DIAGNOSIS — M21969 Unspecified acquired deformity of unspecified lower leg: Secondary | ICD-10-CM

## 2016-02-26 DIAGNOSIS — M216X9 Other acquired deformities of unspecified foot: Secondary | ICD-10-CM

## 2016-02-26 NOTE — Progress Notes (Signed)
Plantar fasciitis x over 10 years. Hurts at the end of work hours. All joints hurt.  SUBJECTIVE: 45 y.o. year old female presents for pain in both feet. Stated that she has plantar fasciitis on both feet over 10 years R>L. Hurts more at the end of the day.  Sometimes it feels as if all her joints are hurting.  REVIEW OF SYSTEMS: Pertinent items noted in HPI and remainder of comprehensive ROS otherwise negative.  OBJECTIVE: DERMATOLOGIC EXAMINATION: Mild dry scaly skin on both feet plantar surfaces.  VASCULAR EXAMINATION OF LOWER LIMBS: All pedal pulses are palpable with normal pulsation.  Capillary Filling times within 3 seconds in all digits.  No edema or erythema noted at affected area. Mild increase in temperature in right heel near STJ area.  NEUROLOGIC EXAMINATION OF THE LOWER LIMBS: All epicritic and tactile sensations are grossly intact.  MUSCULOSKELETAL EXAMINATION: Positive for tight Achilles tendon on both feet. Pain with lateral and direct pressure to left heel and right ankle near STJ area.  RADIOGRAPHIC STUDIES:  AP View:  Adducted metatarsal bones bilateral. Short first metatarsal bone bilateral. Lateral view:  Supinated foot with midtarsal sagging  Anterior break in CYMA line. First ray elevatus present in left foot.   ASSESSMENT: Plantar fasciitis bilateral. Sinus tarsitis right. Metatarsus adductus. Short first ray bilateral. Ankle Equinus bilateral. 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  PLAN: Reviewed clinical findings and available treatment options. Cortisone injection given to left heel and right medial heel near deltoid ligament.  Ankle brace dispensed with instruction. Both feet casted for Orthotics. Instructed on Achilles tendon stretch exercise. Will call when orthotics are  ready.                        ++     ++                                                 + +++++++++++++

## 2016-02-26 NOTE — Patient Instructions (Signed)
Seen for pain in both feet. Noted of abnormal biomechanics of feet resulting excess stress to ankle joint and plantar fascial band. Cortisone injection given to left heel and right medial heel near ankle joint. Both feet casted for Orthotics. Right foot placed in Ankle brace. Will call when orthotics are ready.

## 2016-02-27 ENCOUNTER — Encounter: Payer: Self-pay | Admitting: Physical Therapy

## 2016-02-27 ENCOUNTER — Ambulatory Visit (INDEPENDENT_AMBULATORY_CARE_PROVIDER_SITE_OTHER): Payer: Federal, State, Local not specified - PPO | Admitting: Physical Therapy

## 2016-02-27 DIAGNOSIS — M25662 Stiffness of left knee, not elsewhere classified: Secondary | ICD-10-CM

## 2016-02-27 DIAGNOSIS — M6281 Muscle weakness (generalized): Secondary | ICD-10-CM

## 2016-02-27 DIAGNOSIS — R52 Pain, unspecified: Secondary | ICD-10-CM

## 2016-02-27 NOTE — Therapy (Signed)
[place], [place], [address] Phone: [phone]   Fax:  [phone]  Physical Therapy Treatment  Patient Details  Name: Jennifer Erickson MRN: [id number] Date of Birth: 03/16/1971 Referring Provider: Dr Dianah Field  Encounter Date: 02/27/2016      PT End of Session - 02/27/16 0807    Visit Number 1   Number of Visits 12   Date for PT Re-Evaluation 04/09/16   PT Start Time 0807   PT Stop Time 0913   PT Time Calculation (min) 66 min   Activity Tolerance Patient tolerated treatment well      Past Medical History:  Diagnosis Date  . Personal history of amenorrhea   . Sinusitis     Past Surgical History:  Procedure Laterality Date  . DILATION AND CURETTAGE OF UTERUS    . TONSILLECTOMY      There were no vitals filed for this visit.      Subjective Assessment - 02/27/16 0807    Subjective Pt reports she started having Lt LE pain about 3 months ago - especially with long driving, MD told her its from her back.  She has been having injections in her knee, last one was a couple months ago and her pain is retuning.    Pertinent History has bilat plantar fascitis - having orthothic made will get in 4 weeks and had injections these have helped. She has had issued with her feet for over 20 yrs.    How long can you sit comfortably? 10'    How long can you walk comfortably? able to walk with discomfort in    Diagnostic tests x-rays    Patient Stated Goals not have to take medicine for pain, what else can she do to help her pain.    Currently in Pain? Yes   Pain Score 2    Pain Location Buttocks   Pain Orientation Left   Pain Descriptors / Indicators Aching   Pain Type Acute pain   Pain Onset More than a month ago   Pain Frequency Intermittent   Aggravating Factors  works on concrete - standing, prlonged sitting.    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    Leisure read, travel      Observation/Other Assessments   Focus on Therapeutic Outcomes (FOTO)  47% limited     Functional Tests   Functional tests Squat;Other;Single leg stance     Squat   Comments shift to Rt      Single Leg Stance   Comments bilat WNL     Other:   Other/ Comments fair eccentric  Lt quad control.      Posture/Postural Control   Posture/Postural Control Postural limitations   Postural Limitations Forward head  extra abdominal girth, large chest     ROM / Strength   AROM / PROM / Strength Strength;AROM     AROM   Overall AROM Comments ankle DF Lt 3 degrees, Rt 7   AROM Assessment Site Lumbar   Lumbar Flexion WNL   Lumbar Extension WNL   Lumbar - Right Side Bend WNL   Lumbar - Left Side Bend WNL - pain with turning to stand Lt side   Lumbar - Right Rotation WNL   Lumbar - Left Rotation WNL     Strength   Strength Assessment Site Hip;Knee;Ankle;Lumbar   Right/Left Hip Right;Left   Right Hip Flexion 5/5   Right Hip Extension 5/5   Right Hip ABduction 4/5   Left Hip Flexion 4+/5   Left Hip Extension 5/5   Left Hip ABduction 4+/5   Right/Left Knee --  bilat WNL with break test   Right/Left Ankle --  bilat grossly 4+/5   Lumbar Extension --  multifidi fair.      Flexibility   Soft Tissue Assessment /Muscle Length yes    Hamstrings bilat WNL   Quadriceps prone knee flex Rt 120, Lt 113 with pain    Piriformis tight on Lt side     Palpation   Patella mobility hypomobile with creptitis Lt    Palpation comment hypersensitive Lt greater troch and gluts. , tightness in Lt gluts and piriformis                      [facility] Adult PT Treatment/Exercise - 02/27/16 0001      Exercises   Exercises Knee/Hip     Knee/Hip Exercises: Stretches   Quad Stretch Both;30 seconds  prone with strap   Gastroc Stretch Both;30 seconds     Knee/Hip Exercises: Supine   Bridges Strengthening;15 reps   Other Supine Knee/Hip Exercises 15 reps abd bracing with overhead reaching holding 3#     Modalities   Modalities Electrical Stimulation;Moist Heat     Moist Heat Therapy   Number Minutes Moist Heat 15 Minutes   Moist Heat Location --   buttocks     Electrical Stimulation   Electrical Stimulation Location Lt buttocks   Electrical Stimulation Action IFC   Electrical Stimulation Parameters to tolerance   Electrical Stimulation Goals Pain     Manual Therapy   Manual therapy comments dynamic tape Lt knee for lateral tracking, decreased patients pain with step downs and squats.                 PT Education - 02/27/16 1034    Education provided Yes   Education Details HEP/TENS    Person(s) Educated Patient   Methods Explanation;Demonstration;Handout   Comprehension Returned demonstration;Verbalized understanding             PT Long Term Goals - 02/27/16 1044      PT LONG TERM GOAL #1   Title I with advanced HEP to include a gym program. ( 04/09/16)    Time 6   Period Weeks   Status New     PT LONG TERM GOAL #2   Title demo bilat ankle dorsiflexion =/> 12 degrees passively to allow for gait without LE ER. 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  She is obsese through her mid section leading to extra stress on her body.  She also is being treated for bilat plantar fascitits by a podiatrist and feels like her foot pain could be leading to gait changes and her pain.  She has core weakness, Lt hip weakness and eccentric Lt quad weakness.   She is only able to come once a week due to work schedule and understands she will have to do more work at home.  She also recently joined a gym.    Rehab Potential Good   PT Frequency 1x / week  patient only able to attend once a week.    PT Duration 6 weeks   PT Treatment/Interventions Moist Heat;Ultrasound;Therapeutic exercise;Dry needling;Taping;Vasopneumatic Device;Manual techniques;Neuromuscular re-education;Cryotherapy;Electrical Stimulation;Iontophoresis 49m/ml Dexamethasone;Patient/family education   PT Next Visit Plan retape knee if beneficial for lateral tracking, core strengthening, Lt hip and knee strengthening.  Possible ionto to Lt hip if continues with point specific pain.    Consulted and Agree with Plan of Care Patient      Patient will benefit from skilled therapeutic intervention in order to improve the following deficits and impairments:  Postural dysfunction, Decreased strength, Pain, Hypomobility, Obesity, Difficulty walking, Decreased range of motion  Visit Diagnosis: Muscle weakness (generalized) - Plan: PT plan of care  cert/re-cert  Stiffness of left knee, not elsewhere classified - Plan: PT plan of care cert/re-cert  Pain, generalized - Plan: PT plan of care cert/re-cert     Problem List Patient Active Problem List   Diagnosis Date Noted  . Polyarthralgia 02/18/2016  . Cramps of left lower extremity 11/28/2015  . 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 Palpitations 07/11/2015  . OSA on CPAP 07/10/2015  . Large breasts 05/28/2015  . Lateral epicondylitis (tennis elbow) 05/28/2015  . Right-sided low back pain without sciatica 05/07/2015  . Primary osteoarthritis of both knees 12/08/2014  . Flank pain 11/01/2014  . Vitamin D deficiency 04/19/2014  . Bilateral low back pain without sciatica 01/25/2014  . Bilateral swelling of feet 05/02/2013  . Left breast mass 11/22/2012  . Obstructive sleep apnea 11/22/2012  . Plantar fasciitis, bilateral 11/01/2012  . Dermatitis 05/26/2012  . Uvular swelling 12/30/2011  . Insomnia 12/30/2011  . Obesity, Class III, BMI 40-49.9 (morbid obesity) ([place] 08/19/2011    SJeral PinchPT  02/27/2016, 10:50 AM  [facility] [place] [address]Phone: [phone]  Fax:  [phone] Name: Jennifer KRAJEWSKIMRN: [id number] of Birth: 8June 20, 1972

## 2016-02-27 NOTE — Patient Instructions (Addendum)
Gastroc Stretch    Stand with right foot back, leg straight, forward leg bent. Keeping heel on floor, turned slightly out, lean into wall until stretch is felt in calf. Hold _45___ seconds. Repeat __1__ times per set. Do _1___ sets per session. Do __1__ sessions per day. Repeat on the other side.  Quads / HF, Prone    Lie face down, knees together. Grasp one ankle with same-side hand. Use towel if needed to reach. Gently pull foot toward buttock. Hold __45_ seconds. Repeat _1__ times per session. Do __1_ sessions per day. Repeat on the other side   Extremity Flexion (Hook-Lying)    Tighten stomach and slowly lower right arm over head until back begins to arch. Keep trunk rigid. Repeat __15-25__ times per set. Do ___1_ sets per session. Do __1__ sessions per day. Repeat on the other side.   Bridging    Have knees bent, pressure even through the heel and toes. Slowly raise buttocks from floor, keeping stomach tight. Repeat __15-25__ times per set. Do __1__ sets per session. Do __1__ sessions per day. Copyright  VHI. All rights reserved  TENS UNIT: This is helpful for muscle pain and spasm.   Search and Purchase a TENS 7000 2nd edition at www.tenspros.com. It should be less than $30.     TENS unit instructions: Do not shower or bathe with the unit on Turn the unit off before removing electrodes or batteries If the electrodes lose stickiness add a drop of water to the electrodes after they are disconnected from the unit and place on plastic sheet. If you continued to have difficulty, call the TENS unit company to purchase more electrodes. Do not apply lotion on the skin area prior to use. Make sure the skin is clean and dry as this will help prolong the life of the electrodes. After use, always check skin for unusual red areas, rash or other skin difficulties. If there are any skin problems, does not apply electrodes to the same area. Never remove the electrodes from the unit  by pulling the wires. Do not use the TENS unit or electrodes other than as directed. Do not change electrode placement without consultating your therapist or physician. Keep 2 fingers with between each electrode. Wear time ratio is 2:1, on to off times.    For example on for 30 minutes off for 15 minutes and then on for 30 minutes off for 15 minutes

## 2016-03-03 ENCOUNTER — Ambulatory Visit (INDEPENDENT_AMBULATORY_CARE_PROVIDER_SITE_OTHER): Payer: Federal, State, Local not specified - PPO | Admitting: Sports Medicine

## 2016-03-03 ENCOUNTER — Ambulatory Visit: Payer: Federal, State, Local not specified - PPO | Admitting: Sports Medicine

## 2016-03-03 DIAGNOSIS — M2550 Pain in unspecified joint: Secondary | ICD-10-CM | POA: Diagnosis not present

## 2016-03-03 DIAGNOSIS — M7711 Lateral epicondylitis, right elbow: Secondary | ICD-10-CM | POA: Diagnosis not present

## 2016-03-03 MED ORDER — GABAPENTIN 100 MG PO CAPS: 100.0000 mg | ORAL_CAPSULE | Freq: Two times a day (BID) | ORAL | 11 refills | Status: DC

## 2016-03-03 NOTE — Progress Notes (Signed)
  Subjective:    CC: Right elbow pain  HPI: Widespread pain: Not much better with naproxen 500.  Right elbow pain: History of tennis elbow, injection many months ago, now having recurrence of pain, moderate, persistent, localized at the common extensor tendon without radiation.  Past medical history:  Negative.  See flowsheet/record as well for more information.  Surgical history: Negative.  See flowsheet/record as well for more information.  Family history: Negative.  See flowsheet/record as well for more information.  Social history: Negative.  See flowsheet/record as well for more information.  Allergies, and medications have been entered into the medical record, reviewed, and no changes needed.   Review of Systems: No fevers, chills, night sweats, weight loss, chest pain, or shortness of breath.   Objective:    General: Well Developed, well nourished, and in no acute distress.  Neuro: Alert and oriented x3, extra-ocular muscles intact, sensation grossly intact.  HEENT: Normocephalic, atraumatic, pupils equal round reactive to light, neck supple, no masses, no lymphadenopathy, thyroid nonpalpable.  Skin: Warm and dry, no rashes. Cardiac: Regular rate and rhythm, no murmurs rubs or gallops, no lower extremity edema.  Respiratory: Clear to auscultation bilaterally. Not using accessory muscles, speaking in full sentences. Right Elbow: Unremarkable to inspection. Range of motion full pronation, supination, flexion, extension. Strength is full to all of the above directions Stable to varus, valgus stress. Negative moving valgus stress test. Tender to palpation at the common extensor tendon origin Ulnar nerve does not sublux. Negative cubital tunnel Tinel's.  Procedure: Real-time Ultrasound Guided Injection of right common extensor tendon origin Device: GE Logiq E  Verbal informed consent obtained.  Time-out conducted.  Noted no overlying erythema, induration, or other signs of  local infection.  Skin prepped in a sterile fashion.  Local anesthesia: Topical Ethyl chloride.  With sterile technique and under real time ultrasound guidance:  Using a 25-gauge needle injected medication both superficial to and deep to the extensor tendon, there is a visible full-thickness tear at the origin. A total of 1 mL kenalog 40, 1 mL lidocaine, 1 mL Marcaine was injected. Completed without difficulty  Pain immediately resolved suggesting accurate placement of the medication.  Advised to call if fevers/chills, erythema, induration, drainage, or persistent bleeding.  Images permanently stored and available for review in the ultrasound unit.  Impression: Technically successful ultrasound guided injection.  Impression and Recommendations:    Lateral epicondylitis (tennis elbow) Doing well after injection approximately 10 months ago, repeat right lateral epicondyle/tennis elbow injection today. Return as needed.  Polyarthralgia Discontinue NSAIDs, adding gabapentin low-dose, I do suspect a large element of myofascial pain syndrome.  I spent 25 minutes with this patient, greater than 50% was face-to-face time counseling regarding the above diagnoses, this was separate from the time spent performing the above procedure

## 2016-03-03 NOTE — Assessment & Plan Note (Signed)
Discontinue NSAIDs, adding gabapentin low-dose, I do suspect a large element of myofascial pain syndrome.

## 2016-03-03 NOTE — Assessment & Plan Note (Signed)
Doing well after injection approximately 10 months ago, repeat right lateral epicondyle/tennis elbow injection today. Return as needed.

## 2016-03-05 ENCOUNTER — Ambulatory Visit (INDEPENDENT_AMBULATORY_CARE_PROVIDER_SITE_OTHER): Payer: Federal, State, Local not specified - PPO | Admitting: Physical Therapy

## 2016-03-05 DIAGNOSIS — R52 Pain, unspecified: Secondary | ICD-10-CM

## 2016-03-05 DIAGNOSIS — M6281 Muscle weakness (generalized): Secondary | ICD-10-CM | POA: Diagnosis not present

## 2016-03-05 DIAGNOSIS — M25662 Stiffness of left knee, not elsewhere classified: Secondary | ICD-10-CM | POA: Diagnosis not present

## 2016-03-05 NOTE — Therapy (Addendum)
[place], [place], [address] Phone: [phone]   Fax:  [phone]  Physical Therapy Treatment  Patient Details  Name: Jennifer Erickson MRN: [id number] Date of Birth: Aug 05, 1970 Referring Provider: Dr. Helane Rima  Encounter Date: 03/05/2016      PT End of Session - 03/05/16 1022    Visit Number 2   Number of Visits 12   Date for PT Re-Evaluation 04/09/16   PT Start Time 9678  pt arrived late   PT Stop Time 1117   PT Time Calculation (min) 54 min   Activity Tolerance Patient tolerated treatment well;No increased pain      Past Medical History:  Diagnosis Date  . Personal history of amenorrhea   . Sinusitis     Past Surgical History:  Procedure Laterality Date  . DILATION AND CURETTAGE OF UTERUS    . TONSILLECTOMY      There were no vitals filed for this visit.      Subjective Assessment - 03/05/16 1024    Subjective Pt reports sitting is still painful.  She just found out she has 2 kidney stones, which she feels may be contributing to back pain.  She does a little stretching each day.  She had injections in feet and casted for orthotics.  Her Lt hip is no longer point tender. She did not like the tape that was applied to the knee.    Pertinent History has bilat plantar fascitis - having orthothic made will get in 4 weeks and had injections these have helped. She has had issued with her feet for over 20 yrs.    Currently in Pain? No/denies  sitting longer than 30 min, up to 10.            [facility] PT Assessment - 03/05/16 0001      Assessment   Medical Diagnosis Bilat knee OA and LBP   Referring Provider Dr. Helane Rima   Onset Date/Surgical Date 11/27/15   Hand Dominance Right   Next MD Visit 03/10/16   Prior Therapy long time ago     AROM   Overall AROM Comments Ankle: Lt 8 deg, Rt 5 deg.           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 VC and demo for improved form.      Lumbar Exercises: Supine   Bridge 10 reps;5 seconds   Bridge Limitations then figure 4 single leg bridges x 10 each leg (more difficult on LLE)   Other Supine Lumbar Exercises TA engagement with flexion overhead reach with 2# x 10 reps.      Knee/Hip Exercises: Stretches   Sports administrator Right;Left;3 reps  45 sec   Gastroc Stretch Right;Left;30 seconds;4 reps   Press photographer Limitations (shown leaning against wall, then with heels off step)    Soleus Stretch Right;Left;3 reps     Moist Heat Therapy   Number Minutes Moist Heat 15 Minutes   Moist Heat Location Lumbar Spine            PT Long Term Goals - 03/05/16 1049      PT LONG TERM GOAL #1  Title I with advanced HEP to include a gym program. ( 04/09/16)    Time 6   Period Weeks   Status On-going     PT LONG TERM GOAL #2   Title demo bilat ankle dorsiflexion =/> 12 degrees passively to allow for gait without LE ER. 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  Pt verbalized understanding.           Plan -  03/05/16 1106    Clinical Impression Statement Pt no longer feeling pain in Low back to Lt hamstring.  She tolerated all exercises well, without any increase in symptoms.  Bilat calves remain tight.  No goals met yet; only 2nd visit.    Rehab Potential Good   PT Frequency 1x / week   PT Duration 6 weeks   PT Treatment/Interventions Moist Heat;Ultrasound;Therapeutic exercise;Dry needling;Taping;Vasopneumatic Device;Manual techniques;Neuromuscular re-education;Cryotherapy;Electrical Stimulation;Iontophoresis 17m/ml Dexamethasone;Patient/family education   PT Next Visit Plan Continue progressive core strengthening and LE stretches.    Consulted and Agree with Plan of Care Patient      Patient will benefit from skilled therapeutic intervention in order to improve the following deficits and impairments:  Postural dysfunction, Decreased strength, Pain, Hypomobility, Obesity, Difficulty walking, Decreased range of motion  Visit Diagnosis: Muscle weakness (generalized)  Stiffness of left knee, not elsewhere classified  Pain, generalized     Problem List Patient Active Problem List   Diagnosis Date Noted  . Polyarthralgia 02/18/2016  . Cramps of left lower extremity 11/28/2015  . 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 Palpitations 07/11/2015  . OSA on CPAP 07/10/2015  . Large breasts 05/28/2015  . Lateral epicondylitis (tennis elbow) 05/28/2015  . Right-sided low back pain without sciatica 05/07/2015  . Primary osteoarthritis of both knees 12/08/2014  . Flank pain 11/01/2014  . Vitamin D deficiency 04/19/2014  . Bilateral low back pain without sciatica 01/25/2014  . Bilateral swelling of feet 05/02/2013  . Left breast mass 11/22/2012  . Obstructive sleep apnea 11/22/2012  . Plantar fasciitis, bilateral 11/01/2012  . Dermatitis 05/26/2012  . Uvular swelling 12/30/2011  . Insomnia 12/30/2011  . Obesity, Class III, BMI 40-49.9 (morbid obesity) ([place] 08/19/2011   JKerin Perna  PTA 03/05/16 12:12 PM  [place] [address]Phone: [phone]  Fax:  [phone] Name: TDUDLEY COOLEYMRN: [id number] of Birth: [birth year] PHYSICAL THERAPY DISCHARGE SUMMARY  Visits from Start of Care: 2 Current functional level related to goals / functional outcomes: unknown   Remaining deficits: unknown   Education / Equipment: unknown Plan:  Patient goals were not met. Patient is being discharged due to not returning since the last visit.  ?????    Jeral Pinch, PT 04/04/16 8:43 AM

## 2016-03-10 ENCOUNTER — Ambulatory Visit: Payer: Federal, State, Local not specified - PPO | Admitting: Sports Medicine

## 2016-03-11 ENCOUNTER — Encounter: Payer: Federal, State, Local not specified - PPO | Admitting: Physical Therapy

## 2016-03-13 ENCOUNTER — Encounter: Payer: Self-pay | Admitting: Osteopathic Medicine

## 2016-03-13 ENCOUNTER — Ambulatory Visit (INDEPENDENT_AMBULATORY_CARE_PROVIDER_SITE_OTHER): Payer: Federal, State, Local not specified - PPO | Admitting: Osteopathic Medicine

## 2016-03-13 VITALS — BP 143/92 | HR 89 | Ht 66.0 in | Wt 270.0 lb

## 2016-03-13 DIAGNOSIS — R030 Elevated blood-pressure reading, without diagnosis of hypertension: Secondary | ICD-10-CM

## 2016-03-13 DIAGNOSIS — B373 Candidiasis of vulva and vagina: Secondary | ICD-10-CM | POA: Diagnosis not present

## 2016-03-13 DIAGNOSIS — B3731 Acute candidiasis of vulva and vagina: Secondary | ICD-10-CM

## 2016-03-13 DIAGNOSIS — R300 Dysuria: Secondary | ICD-10-CM | POA: Diagnosis not present

## 2016-03-13 DIAGNOSIS — R0789 Other chest pain: Secondary | ICD-10-CM | POA: Diagnosis not present

## 2016-03-13 DIAGNOSIS — R8290 Unspecified abnormal findings in urine: Secondary | ICD-10-CM

## 2016-03-13 LAB — POCT URINALYSIS DIPSTICK
BILIRUBIN UA: NEGATIVE
Glucose, UA: NEGATIVE
KETONES UA: NEGATIVE
Nitrite, UA: NEGATIVE
PH UA: 6.5
Protein, UA: NEGATIVE
RBC UA: NEGATIVE
Spec Grav, UA: 1.02
Urobilinogen, UA: 0.2

## 2016-03-13 MED ORDER — SULFAMETHOXAZOLE-TRIMETHOPRIM 800-160 MG PO TABS: 1.0000 | ORAL_TABLET | Freq: Two times a day (BID) | ORAL | 0 refills | Status: DC

## 2016-03-13 MED ORDER — FLUCONAZOLE 150 MG PO TABS: 150.0000 mg | ORAL_TABLET | Freq: Once | ORAL | 1 refills | Status: AC

## 2016-03-13 MED FILL — FLUCONAZOLE 150 MG TABLET: 150 | 2 days supply | Qty: 2 | Fill #0

## 2016-03-13 MED FILL — SULFAMETHOXAZOLE-TMP DS TAB: 800-160 | 3 days supply | Qty: 6 | Fill #0

## 2016-03-13 MED FILL — GABAPENTIN 100 MG CAPSULE: 100 | 30 days supply | Qty: 60 | Fill #0

## 2016-03-13 NOTE — Progress Notes (Signed)
Chief Complaint: Possible UTI  History of Present Illness: Jennifer Erickson is a 45 y.o. female who presents to [facility]  today with concerns for Chief Complaint  Patient presents with  . Other    URINE ODOR   Urinary odor/concern for UTI Onset: today   Quality: odor to the urine Associated Symptoms: see ROS below, (+)back pain in lower L Context:  Previous UTI: Thinks was a while ago, she states that the last time she had a UTI it was similar to now where she really didn't have symptoms but then she got quite sick fairly quickly  Recurrent UTI (3 times/more annually): no   Chest pain . Location: substernal . Quality: sore/pressure . Duration: onset last night, better now but still there  . Timing: constant . Modifying factors: started after eating pizza and then kombucha on an empty stomach later on, tried Tums and didn't help, nothing really makes it better or worse . Assoc signs/symptoms: no cough/URI/SOB, no CP on exertion, no dyspnea on exertion     Past medical, social and family history reviewed: Past Medical History:  Diagnosis Date  . Personal history of amenorrhea   . Sinusitis    Past Surgical History:  Procedure Laterality Date  . DILATION AND CURETTAGE OF UTERUS    . TONSILLECTOMY     Social History  Substance Use Topics  . Smoking status: Never Smoker  . Smokeless tobacco: Never Used  . Alcohol use No   The patient has a family history of  Current Outpatient Prescriptions  Medication Sig Dispense Refill  . etonogestrel (IMPLANON) 68 MG IMPL implant Inject 1 each into the skin once.    . gabapentin (NEURONTIN) 100 MG capsule Take 1 capsule (100 mg total) by mouth 2 (two) times daily. 60 capsule 11   No current facility-administered medications for this visit.    Allergies  Allergen Reactions  . Aspirin Other (See Comments)    GI issues  . Ciprofloxacin     Nausea and vomiting.   [name] Hydrocodone      Cough syrup  . Ivp Dye [Iodinated Diagnostic Agents]      Review of Systems: CONSTITUTIONAL: Negative fever/chills CARDIAC: +chest pain/pressure, No palpitations, no orthopnea RESPIRATORY: No cough/shortness of breath/wheeze GASTROINTESTINAL: No nausea/vomiting/abdominal pain/blood in stool/diarrhea/constipation MUSCULOSKELETAL: see below re: flank pain GENITOURINARY:    Frequency: yes  Hematuria: no  Odor: yes  Incontinence: no  Flank Pain: yes  Vaginal bleeding/discharge: no   Exam:  BP (!) 143/92   Pulse 89   Ht 5' 6"  (1.676 m)   Wt 270 lb (122.5 kg)   BMI 43.58 kg/m  Constitutional: VSS, see above. General Appearance: alert, well-developed, well-nourished, NAD Respiratory: Normal respiratory effort. Breath sounds normal, no wheeze/rhonchi/rales Cardiovascular: S1/S2 normal, no murmur/rub/gallop auscultated. RRR, no LE edema, no JVD Gastrointestinal: Nontender, no masses. No hepatomegaly, no splenomegaly. No hernia appreciated. Rectal exam deferred.  Musculoskeletal: +mild tenderness to palpation of L and R sternal border, No clubbing/cyanosis of digits. Lloyd sign negative bilateral  Results for orders placed or performed in visit on 03/13/16 (from the past 24 hour(s))  POCT Urinalysis Dipstick     Status: Abnormal   Collection Time: 03/13/16  3:14 PM  Result Value Ref Range   Color, UA YELLOW    Clarity, UA CLEAR    Glucose, UA NEGATIVE    Bilirubin, UA NEGATIVE    Ketones, UA NEGATIVE    Spec Grav, UA 1.020  Blood, UA NEGATIVE    pH, UA 6.5    Protein, UA NEGATIVE    Urobilinogen, UA 0.2    Nitrite, UA NEGATIVE    Leukocytes, UA small (1+) (A) Negative    Previous Culture Results: neg 10/2014, multiple species 05/2012  EKG interpretation: Rate: 82 Rhythm: sinus No ST/T changes concerning for acute ischemia/infarct    ASSESSMENT/PLAN:   Bad odor of urine - Plan: POCT Urinalysis Dipstick  Dysuria - Plan: Urine Culture, sulfamethoxazole-trimethoprim  (BACTRIM DS) 800-160 MG tablet  Vaginal yeast infection - Plan: fluconazole (DIFLUCAN) 150 MG tablet  Other chest pain - No angina, elevated blood pressure warrants recheck at future visit. ER/RTC precautions reviewed, no concerns on EKG. Likely musculoskeletal/GI  Elevated blood pressure reading - Advise follow-up with PCP     Patient Instructions  If chest pain persists/worsens, please seek care ASAP! I think most likely this is due to costochondritis or other musculoskeletal issue - try Ibuprofen or Aleve with Tylenol.   We will call with urine culture results on Monday. If fever/chills, abdominal pain or other concerns, please seek care.    Patient advised we will call with urine culture results once available, depending on results may need to change therapy. Return if symptoms worsen or fail to improve.

## 2016-03-13 NOTE — Patient Instructions (Signed)
If chest pain persists/worsens, please seek care ASAP! I think most likely this is due to costochondritis or other musculoskeletal issue - try Ibuprofen or Aleve with Tylenol.   We will call with urine culture results on Monday. If fever/chills, abdominal pain or other concerns, please seek care.

## 2016-03-14 NOTE — Addendum Note (Signed)
Addended by: Huel Cote on: 03/14/2016 11:53 AM   Modules accepted: Orders

## 2016-03-16 LAB — URINE CULTURE

## 2016-03-18 ENCOUNTER — Encounter: Payer: Self-pay | Admitting: Sports Medicine

## 2016-03-18 ENCOUNTER — Ambulatory Visit (INDEPENDENT_AMBULATORY_CARE_PROVIDER_SITE_OTHER): Payer: Federal, State, Local not specified - PPO | Admitting: Sports Medicine

## 2016-03-18 DIAGNOSIS — M170 Bilateral primary osteoarthritis of knee: Secondary | ICD-10-CM | POA: Diagnosis not present

## 2016-03-18 DIAGNOSIS — M2550 Pain in unspecified joint: Secondary | ICD-10-CM | POA: Diagnosis not present

## 2016-03-18 DIAGNOSIS — M7711 Lateral epicondylitis, right elbow: Secondary | ICD-10-CM

## 2016-03-18 NOTE — Assessment & Plan Note (Signed)
Recurrence of left knee pain, injection today.

## 2016-03-18 NOTE — Assessment & Plan Note (Signed)
Has not yet started her gabapentin, there is a large element of myofascial pain syndrome here. Return in one month.

## 2016-03-18 NOTE — Progress Notes (Signed)
  Subjective:    CC: follow-up  HPI: Tennis elbow: Resolved after injection  Bilateral knee osteoarthritis, having a recurrence of pain in the left knee, desires injection, pain is moderate, persistent, localized under the patella without radiation, no mechanical symptoms.  Widespread myofascial pain: Has not yet started her gabapentin. Only started turmeric.  Past medical history:  Negative.  See flowsheet/record as well for more information.  Surgical history: Negative.  See flowsheet/record as well for more information.  Family history: Negative.  See flowsheet/record as well for more information.  Social history: Negative.  See flowsheet/record as well for more information.  Allergies, and medications have been entered into the medical record, reviewed, and no changes needed.   Review of Systems: No fevers, chills, night sweats, weight loss, chest pain, or shortness of breath.   Objective:    General: Well Developed, well nourished, and in no acute distress.  Neuro: Alert and oriented x3, extra-ocular muscles intact, sensation grossly intact.  HEENT: Normocephalic, atraumatic, pupils equal round reactive to light, neck supple, no masses, no lymphadenopathy, thyroid nonpalpable.  Skin: Warm and dry, no rashes. Cardiac: Regular rate and rhythm, no murmurs rubs or gallops, no lower extremity edema.  Respiratory: Clear to auscultation bilaterally. Not using accessory muscles, speaking in full sentences. leftKnee: Normal to inspection with no erythema or effusion or obvious bony abnormalities. Tender to palpation at the medial joint line as well as the patellar facets ROM normal in flexion and extension and lower leg rotation. Ligaments with solid consistent endpoints including ACL, PCL, LCL, MCL. Negative Mcmurray's and provocative meniscal tests. Non painful patellar compression. Patellar and quadriceps tendons unremarkable. Hamstring and quadriceps strength is  normal.  Procedure: Real-time Ultrasound Guided Injection of left knee Device: GE Logiq E  Verbal informed consent obtained.  Time-out conducted.  Noted no overlying erythema, induration, or other signs of local infection.  Skin prepped in a sterile fashion.  Local anesthesia: Topical Ethyl chloride.  With sterile technique and under real time ultrasound guidance:  1 mL kenalog 40, 2 mL lidocaine, 2 mL Marcaine injected easily Completed without difficulty  Pain immediately resolved suggesting accurate placement of the medication.  Advised to call if fevers/chills, erythema, induration, drainage, or persistent bleeding.  Images permanently stored and available for review in the ultrasound unit.  Impression: Technically successful ultrasound guided injection.  Impression and Recommendations:    Lateral epicondylitis (tennis elbow) Improved significantly after injection  Primary osteoarthritis of both knees Recurrence of left knee pain, injection today.  Polyarthralgia Has not yet started her gabapentin, there is a large element of myofascial pain syndrome here. Return in one month.

## 2016-03-18 NOTE — Assessment & Plan Note (Signed)
Improved significantly after injection

## 2016-04-03 ENCOUNTER — Ambulatory Visit: Payer: Federal, State, Local not specified - PPO | Admitting: Sports Medicine

## 2016-04-15 ENCOUNTER — Ambulatory Visit (INDEPENDENT_AMBULATORY_CARE_PROVIDER_SITE_OTHER): Payer: Federal, State, Local not specified - PPO

## 2016-04-15 ENCOUNTER — Ambulatory Visit (INDEPENDENT_AMBULATORY_CARE_PROVIDER_SITE_OTHER): Payer: Federal, State, Local not specified - PPO | Admitting: Family Medicine

## 2016-04-15 VITALS — BP 124/83 | HR 88

## 2016-04-15 DIAGNOSIS — X58XXXA Exposure to other specified factors, initial encounter: Secondary | ICD-10-CM | POA: Diagnosis not present

## 2016-04-15 DIAGNOSIS — S99921A Unspecified injury of right foot, initial encounter: Secondary | ICD-10-CM

## 2016-04-15 DIAGNOSIS — S92911A Unspecified fracture of right toe(s), initial encounter for closed fracture: Secondary | ICD-10-CM

## 2016-04-15 DIAGNOSIS — M79671 Pain in right foot: Secondary | ICD-10-CM | POA: Diagnosis not present

## 2016-04-15 NOTE — Progress Notes (Signed)
   Subjective:    I'm seeing this patient as a consultation for:  Iran Planas, PA-C   CC: Right toe injury  HPI: Patient tripped and stubbed her right toe a few days ago. She noted initial pain swelling and bruising. She's been using buddy tape at home which has helped some. She notes the pain is improving but continues to be there. She's not been to work since she had her child because she has trouble and pain wearing her work boots. She has this is improving. She denies any fevers or chills.  Past medical history, Surgical history, Family history not pertinant except as noted below, Social history, Allergies, and medications have been entered into the medical record, reviewed, and no changes needed.   Review of Systems: No headache, visual changes, nausea, vomiting, diarrhea, constipation, dizziness, abdominal pain, skin rash, fevers, chills, night sweats, weight loss, swollen lymph nodes, body aches, joint swelling, muscle aches, chest pain, shortness of breath, mood changes, visual or auditory hallucinations.   Objective:    Vitals:   04/15/16 1415  BP: 124/83  Pulse: 88   General: Well Developed, well nourished, and in no acute distress.  Neuro/Psych: Alert and oriented x3, extra-ocular muscles intact, able to move all 4 extremities, sensation grossly intact. Skin: Warm and dry, no rashes noted.  Respiratory: Not using accessory muscles, speaking in full sentences, trachea midline.  Cardiovascular: Pulses palpable, no extremity edema. Abdomen: Does not appear distended. MSK: Ecchymosis and tender at the tip of the right fourth toe. No for many present. Capillary refill and sensation is intact distally.  X-ray right foot shows possible lucency and distal phalanx fourth. Awaiting formal radiology review.  No results found for this or any previous visit (from the past 24 hour(s)). No results found.  Impression and Recommendations:    Assessment and Plan: 46 y.o. female with  Right fourth toe pain likely puffs fracture.. Treat with relative rest and buddy taped. Patient thinks she is given a be able to work. I'm doubtful that a postoperative shoe would help very much here. Follow-up in 2-4 weeks.  Discussed warning signs or symptoms. Please see discharge instructions. Patient expresses understanding.

## 2016-04-15 NOTE — Patient Instructions (Signed)
Thank you for coming in today. Return in 2-4 weeks for recheck.  Continue buddy tape.    How to Buddy Tape Introduction Buddy taping refers to taping an injured finger or toe to an uninjured finger or toe that is next to it. This protects the injured finger or toe and keeps it from moving while the injury heals. You may buddy tape a finger or toe if you have a minor sprain. Your health care provider may buddy tape your finger or toe if you have a sprain, dislocation, or fracture. You may be told to replace your buddy taping as needed. What are the risks? Generally, buddy taping is safe. However, problems may occur, such as:  Skin injury or infection.  Reduced blood flow to the finger or toe.  Skin reaction to the tape. Do not buddy tape your toe if you have diabetes. Do not buddy tape if you know that you have an allergy to adhesives or surgical tape. How to buddy tape Before Buddy Taping  Try to reduce any pain and swelling with rest, icing, and elevation:  Avoid any activity that causes pain.  Raise (elevate) your hand or foot above the level of your heart while you are sitting or lying down.  If directed, apply ice to the injured area:  Put ice in a plastic bag.  Place a towel between your skin and the bag.  Leave the ice on for 20 minutes, 2-3 times per day. Buddy Taping Procedure  Clean and dry your finger or toe as told by your health care provider.  Place a gauze pad or a piece of cloth or cotton between your injured finger or toe and the uninjured finger or toe.  Use tape to wrap around both fingers or toes so your injured finger or toe is secured to the uninjured finger or toe.  The tape should be snug, but not tight.  Make sure the ends of the piece of tape overlap.  Avoid placing tape directly over the joint.  Change the tape and the padding as told by your health care provider. Remove and replace the tape or padding if it becomes loose, worn, dirty, or  wet. After Buddy Taping  Take over-the-counter and prescription medicines only as told by your health care provider.  Return to your normal activities as told by your health care provider. Ask your health care provider what activities are safe for you.  Watch the buddy-taped area and always remove buddy taping if:  Your pain gets worse.  Your fingers turn pale or blue.  Your skin becomes irritated. Contact a health care provider if:  You have pain, swelling, or bruising that lasts longer than three days.  You have a fever.  Your skin is red, cracked, or irritated. Get help right away if:  The injured area becomes cold, numb, or pale.  You have severe pain, swelling, bruising, or loss of movement in your finger or toe.  Your finger or toe changes shape (deformity). This information is not intended to replace advice given to you by your health care provider. Make sure you discuss any questions you have with your health care provider. Document Released: 04/24/2004 Document Revised: 08/23/2015 Document Reviewed: 08/09/2014  2017 [employer]vier

## 2016-05-07 ENCOUNTER — Telehealth: Payer: Self-pay | Admitting: Allergy

## 2016-05-07 NOTE — Telephone Encounter (Signed)
Still coughing a lot. Sore throat, lot of drainage. Little yellow and little blood in it. Hard to get a lot up. Mucinex makes her sick. No fever.  Achy and sore from coughing so much Using Zyrtec D.Please advise. Phone [phone].

## 2016-05-07 NOTE — Telephone Encounter (Signed)
Wrong patient

## 2016-06-12 ENCOUNTER — Ambulatory Visit: Payer: Federal, State, Local not specified - PPO | Admitting: Podiatry

## 2016-06-17 DIAGNOSIS — Z6841 Body Mass Index (BMI) 40.0 and over, adult: Secondary | ICD-10-CM | POA: Diagnosis not present

## 2016-06-17 DIAGNOSIS — Z01419 Encounter for gynecological examination (general) (routine) without abnormal findings: Secondary | ICD-10-CM | POA: Diagnosis not present

## 2016-06-17 DIAGNOSIS — H40013 Open angle with borderline findings, low risk, bilateral: Secondary | ICD-10-CM | POA: Diagnosis not present

## 2016-06-18 DIAGNOSIS — H40013 Open angle with borderline findings, low risk, bilateral: Secondary | ICD-10-CM | POA: Diagnosis not present

## 2016-06-30 ENCOUNTER — Ambulatory Visit (INDEPENDENT_AMBULATORY_CARE_PROVIDER_SITE_OTHER): Payer: Federal, State, Local not specified - PPO | Admitting: Physician Assistant

## 2016-06-30 ENCOUNTER — Encounter: Payer: Self-pay | Admitting: Physician Assistant

## 2016-06-30 ENCOUNTER — Telehealth: Payer: Self-pay | Admitting: Physician Assistant

## 2016-06-30 VITALS — BP 126/85 | HR 85 | Ht 66.0 in | Wt 272.0 lb

## 2016-06-30 DIAGNOSIS — E559 Vitamin D deficiency, unspecified: Secondary | ICD-10-CM

## 2016-06-30 DIAGNOSIS — Z0000 Encounter for general adult medical examination without abnormal findings: Secondary | ICD-10-CM | POA: Diagnosis not present

## 2016-06-30 DIAGNOSIS — Z131 Encounter for screening for diabetes mellitus: Secondary | ICD-10-CM | POA: Diagnosis not present

## 2016-06-30 DIAGNOSIS — Z13220 Encounter for screening for lipoid disorders: Secondary | ICD-10-CM

## 2016-06-30 MED ORDER — VITAMIN D (ERGOCALCIFEROL) 1.25 MG (50000 UNIT) PO CAPS: 50000.0000 [IU] | ORAL_CAPSULE | ORAL | 0 refills | Status: DC

## 2016-06-30 MED FILL — VIT D2 1.25 MG (50,000 UNIT: 1.25 MG | 84 days supply | Qty: 12 | Fill #0

## 2016-06-30 NOTE — Progress Notes (Signed)
Subjective:     Jennifer Erickson is a 46 y.o. female and is here for a comprehensive physical exam. The patient reports no problems.  Social History   Social History  . Marital status: Married    Spouse name: N/A  . Number of children: N/A  . Years of education: N/A   Occupational History  . Not on file.   Social History Main Topics  . Smoking status: Never Smoker  . Smokeless tobacco: Never Used  . Alcohol use No  . Drug use: No  . Sexual activity: Yes    Birth control/ protection: Pill   Other Topics Concern  . Not on file   Social History Narrative  . No narrative on file   Health Maintenance  Topic Date Due  . MAMMOGRAM  11/01/2013  . PAP SMEAR  06/08/2014  . HIV Screening  07/01/2026 (Originally 11/11/1985)  . INFLUENZA VACCINE  10/29/2016  . TETANUS/TDAP  09/08/2018    The following portions of the patient's history were reviewed and updated as appropriate: allergies, current medications, past family history, past medical history, past social history, past surgical history and problem list.  Review of Systems A comprehensive review of systems was negative.   Objective:    BP 126/85   Pulse 85   Ht 5' 6"  (1.676 m)   Wt 272 lb (123.4 kg)   BMI 43.90 kg/m  General appearance: alert, cooperative, appears stated age and morbidly obese Head: Normocephalic, without obvious abnormality, atraumatic Eyes: conjunctivae/corneas clear. PERRL, EOM's intact. Fundi benign. Ears: normal TM's and external ear canals both ears Nose: Nares normal. Septum midline. Mucosa normal. No drainage or sinus tenderness. Throat: lips, mucosa, and tongue normal; teeth and gums normal Neck: no adenopathy, no carotid bruit, no JVD, supple, symmetrical, trachea midline and thyroid not enlarged, symmetric, no tenderness/mass/nodules Back: symmetric, no curvature. ROM normal. No CVA tenderness. Lungs: clear to auscultation bilaterally Heart: regular rate and rhythm, S1, S2 normal,  no murmur, click, rub or gallop Abdomen: soft, non-tender; bowel sounds normal; no masses,  no organomegaly Extremities: extremities normal, atraumatic, no cyanosis or edema Pulses: 2+ and symmetric Skin: Skin color, texture, turgor normal. No rashes or lesions Lymph nodes: Cervical, supraclavicular, and axillary nodes normal. Neurologic: Alert and oriented X 3, normal strength and tone. Normal symmetric reflexes. Normal coordination and gait    Assessment:    Healthy female exam.      Plan:     .Jennifer Erickson was seen today for annual exam.  Diagnoses and all orders for this visit:  Routine physical examination -     Lipid panel -     COMPLETE METABOLIC PANEL WITH GFR -     Vitamin D 1,25 dihydroxy  Screening for lipid disorders -     Lipid panel  Screening for diabetes mellitus -     COMPLETE METABOLIC PANEL WITH GFR  Vitamin D deficiency -     Vitamin D 1,25 dihydroxy  Other orders -     Vitamin D, Ergocalciferol, (DRISDOL) 50000 units CAPS capsule; Take 1 capsule (50,000 Units total) by mouth every 7 (seven) days.   Vitamin D has always been low. Go ahead and start and recheck labs in 3 months.  Pap done at [facility]. Will get copy.  Mammogram scheduled in the next week.  Pt gets regular eye exams from [facility] will get last eye exam.  Discussed weight loss and regular exercise.  Vitamin d and calcium encouraged.     Marland KitchenMarland Kitchen  Depression screen PHQ 2/9 06/30/2016  Decreased Interest 0  Down, Depressed, Hopeless 0  PHQ - 2 Score 0    PAP done at [facility].  Mammogram scheduled in next few weeks.   Vitamin D historically low.  See After Visit Summary for Counseling Recommendations

## 2016-06-30 NOTE — Patient Instructions (Signed)
Keeping You Healthy  Get These Tests 1. Blood Pressure- Have your blood pressure checked once a year by your health care provider.  Normal blood pressure is 120/80. 2. Weight- Have your body mass index (BMI) calculated to screen for obesity.  BMI is measure of body fat based on height and weight.  You can also calculate your own BMI at GravelBags.it. 3. Cholesterol- Have your cholesterol checked every 5 years starting at age 46 then yearly starting at age 36. 70. Chlamydia, HIV, and other sexually transmitted diseases- Get screened every year until age 66, then within three months of each new sexual provider. 5. Pap Test - Every 1-5 years; discuss with your health care provider. 6. Mammogram- Every 1-2 years starting at age 49--50  Take these medicines  Calcium with Vitamin D-Your body needs 1200 mg of Calcium each day and [phone] IU of Vitamin D daily.  Your body can only absorb 500 mg of Calcium at a time so Calcium must be taken in 2 or 3 divided doses throughout the day.  Multivitamin with folic acid- Once daily if it is possible for you to become pregnant.  Get these Immunizations  Gardasil-Series of three doses; prevents HPV related illness such as genital warts and cervical cancer.  Menactra-Single dose; prevents meningitis.  Tetanus shot- Every 10 years.  Flu shot-Every year.  Take these steps 1. Do not smoke-Your healthcare provider can help you quit.  For tips on how to quit go to www.smokefree.gov or call 1-800 QUITNOW. 2. Be physically active- Exercise 5 days a week for at least 30 minutes.  If you are not already physically active, start slow and gradually work up to 30 minutes of moderate physical activity.  Examples of moderate activity include walking briskly, dancing, swimming, bicycling, etc. 3. Breast Cancer- A self breast exam every month is important for early detection of breast cancer.  For more information and instruction on self breast exams, ask your  healthcare provider or https://www.patel.info/. 4. Eat a healthy diet- Eat a variety of healthy foods such as fruits, vegetables, whole grains, low fat milk, low fat cheeses, yogurt, lean meats, poultry and fish, beans, nuts, tofu, etc.  For more information go to www. Thenutritionsource.org 5. Drink alcohol in moderation- Limit alcohol intake to one drink or less per day. Never drink and drive. 6. Depression- Your emotional health is as important as your physical health.  If you're feeling down or losing interest in things you normally enjoy please talk to your healthcare provider about being screened for depression. 7. Dental visit- Brush and floss your teeth twice daily; visit your dentist twice a year. 8. Eye doctor- Get an eye exam at least every 2 years. 9. Helmet use- Always wear a helmet when riding a bicycle, motorcycle, rollerblading or skateboarding. 46. Safe sex- If you may be exposed to sexually transmitted infections, use a condom. 11. Seat belts- Seat belts can save your live; always wear one. 12. Smoke/Carbon Monoxide detectors- These detectors need to be installed on the appropriate level of your home. Replace batteries at least once a year. 13. Skin cancer- When out in the sun please cover up and use sunscreen 15 SPF or higher. 14. Violence- If anyone is threatening or hurting you, please tell your healthcare provider.

## 2016-06-30 NOTE — Telephone Encounter (Signed)
Please call [facility] and get copy of last few visits.

## 2016-07-03 NOTE — Telephone Encounter (Signed)
Being faxed.

## 2016-07-08 DIAGNOSIS — Z1231 Encounter for screening mammogram for malignant neoplasm of breast: Secondary | ICD-10-CM | POA: Diagnosis not present

## 2016-07-21 ENCOUNTER — Ambulatory Visit (INDEPENDENT_AMBULATORY_CARE_PROVIDER_SITE_OTHER): Payer: Federal, State, Local not specified - PPO | Admitting: Sports Medicine

## 2016-07-21 ENCOUNTER — Encounter: Payer: Self-pay | Admitting: Sports Medicine

## 2016-07-21 DIAGNOSIS — M170 Bilateral primary osteoarthritis of knee: Secondary | ICD-10-CM | POA: Diagnosis not present

## 2016-07-21 NOTE — Progress Notes (Signed)
  Subjective:    CC: bilateral knee pain  HPI: This is a pleasant 46 year old female, she has known bilateral knee osteoarthritis, the last injection was many months ago, having a recurrence of pain, moderate, persistent, at the joint lines and patellofemoral, worse with weightbearing with gelling, mild swelling, no mechanical symptoms. Desires interventional treatment today.  Past medical history:  Negative.  See flowsheet/record as well for more information.  Surgical history: Negative.  See flowsheet/record as well for more information.  Family history: Negative.  See flowsheet/record as well for more information.  Social history: Negative.  See flowsheet/record as well for more information.  Allergies, and medications have been entered into the medical record, reviewed, and no changes needed.   Review of Systems: No fevers, chills, night sweats, weight loss, chest pain, or shortness of breath.   Objective:    General: Well Developed, well nourished, and in no acute distress.  Neuro: Alert and oriented x3, extra-ocular muscles intact, sensation grossly intact.  HEENT: Normocephalic, atraumatic, pupils equal round reactive to light, neck supple, no masses, no lymphadenopathy, thyroid nonpalpable.  Skin: Warm and dry, no rashes. Cardiac: Regular rate and rhythm, no murmurs rubs or gallops, no lower extremity edema.  Respiratory: Clear to auscultation bilaterally. Not using accessory muscles, speaking in full sentences.  Procedure: Real-time Ultrasound Guided Injection of left knee Device: GE Logiq E  Verbal informed consent obtained.  Time-out conducted.  Noted no overlying erythema, induration, or other signs of local infection.  Skin prepped in a sterile fashion.  Local anesthesia: Topical Ethyl chloride.  With sterile technique and under real time ultrasound guidance:  1 mL kenalog 40, 2 mL lidocaine, 2 mL bupivacaine injected easily. Completed without difficulty  Pain  immediately resolved suggesting accurate placement of the medication.  Advised to call if fevers/chills, erythema, induration, drainage, or persistent bleeding.  Images permanently stored and available for review in the ultrasound unit.  Impression: Technically successful ultrasound guided injection.  Procedure: Real-time Ultrasound Guided Injection of right knee Device: GE Logiq E  Verbal informed consent obtained.  Time-out conducted.  Noted no overlying erythema, induration, or other signs of local infection.  Skin prepped in a sterile fashion.  Local anesthesia: Topical Ethyl chloride.  With sterile technique and under real time ultrasound guidance:  1 mL Kenalog 40, 2 mL lidocaine, 2 mL bupivacaine injected easily. Completed without difficulty  Pain immediately resolved suggesting accurate placement of the medication.  Advised to call if fevers/chills, erythema, induration, drainage, or persistent bleeding.  Images permanently stored and available for review in the ultrasound unit.  Impression: Technically successful ultrasound guided injection.  Impression and Recommendations:    Primary osteoarthritis of both knees Previous injection was 4 months ago, repeat injections, this time bilateral.

## 2016-07-21 NOTE — Assessment & Plan Note (Signed)
Previous injection was 4 months ago, repeat injections, this time bilateral.

## 2016-07-22 ENCOUNTER — Encounter: Payer: Self-pay | Admitting: Physician Assistant

## 2016-07-29 ENCOUNTER — Encounter: Payer: Self-pay | Admitting: Family Medicine

## 2016-07-29 ENCOUNTER — Ambulatory Visit (INDEPENDENT_AMBULATORY_CARE_PROVIDER_SITE_OTHER): Payer: Federal, State, Local not specified - PPO | Admitting: Family Medicine

## 2016-07-29 VITALS — BP 133/81 | HR 117 | Temp 99.9°F | Ht 66.0 in | Wt 264.0 lb

## 2016-07-29 DIAGNOSIS — R300 Dysuria: Secondary | ICD-10-CM

## 2016-07-29 DIAGNOSIS — J0110 Acute frontal sinusitis, unspecified: Secondary | ICD-10-CM

## 2016-07-29 DIAGNOSIS — N3000 Acute cystitis without hematuria: Secondary | ICD-10-CM

## 2016-07-29 LAB — POCT URINALYSIS DIPSTICK
BILIRUBIN UA: NEGATIVE
Glucose, UA: NEGATIVE
KETONES UA: NEGATIVE
Nitrite, UA: POSITIVE
PROTEIN UA: 30
SPEC GRAV UA: 1.015 (ref 1.010–1.025)
Urobilinogen, UA: 0.2 E.U./dL
pH, UA: 6 (ref 5.0–8.0)

## 2016-07-29 MED ORDER — FLUCONAZOLE 150 MG PO TABS: 150.0000 mg | ORAL_TABLET | Freq: Once | ORAL | 0 refills | Status: AC

## 2016-07-29 MED ORDER — EPINEPHRINE 0.3 MG/0.3ML IJ SOAJ: INTRAMUSCULAR | 4 refills | Status: DC

## 2016-07-29 MED ORDER — SULFAMETHOXAZOLE-TRIMETHOPRIM 800-160 MG PO TABS: 1.0000 | ORAL_TABLET | Freq: Two times a day (BID) | ORAL | 0 refills | Status: DC

## 2016-07-29 MED FILL — SULFAMETHOXAZOLE/TMP DS TAB: 800-160 | 10 days supply | Qty: 20 | Fill #0

## 2016-07-29 MED FILL — FLUCONAZOLE 150 MG TABLET: 150 | 2 days supply | Qty: 2 | Fill #0

## 2016-07-29 NOTE — Patient Instructions (Addendum)
Sinus Rinse  What is a sinus rinse?  A sinus rinse is a simple home treatment that is used to rinse your sinuses with a sterile mixture of salt and water (saline solution). Sinuses are air-filled spaces in your skull behind the bones of your face and forehead that open into your nasal cavity.  You will use the following:  · Saline solution.  · Neti pot or spray bottle. This releases the saline solution into your nose and through your sinuses. Neti pots and spray bottles can be purchased at your local pharmacy, a health food store, or online.    When would I do a sinus rinse?  A sinus rinse can help to clear mucus, dirt, dust, or pollen from the nasal cavity. You may do a sinus rinse when you have a cold, a virus, nasal allergy symptoms, a sinus infection, or stuffiness in the nose or sinuses.  If you are considering a sinus rinse:  · Ask your child's health care provider before performing a sinus rinse on your child.  · Do not do a sinus rinse if you have had ear or nasal surgery, ear infection, or blocked ears.    How do I do a sinus rinse?  · Wash your hands.  · Disinfect your device according to the directions provided and then dry it.  · Use the solution that comes with your device or one that is sold separately in stores. Follow the mixing directions on the package.  · Fill your device with the amount of saline solution as directed by the device instructions.  · Stand over a sink and tilt your head sideways over the sink.  · Place the spout of the device in your upper nostril (the one closer to the ceiling).  · Gently pour or squeeze the saline solution into the nasal cavity. The liquid should drain to the lower nostril if you are not overly congested.  · Gently blow your nose. Blowing too hard may cause ear pain.  · Repeat in the other nostril.  · Clean and rinse your device with clean water and then air-dry it.  Are there risks of a sinus rinse?  Sinus rinse is generally very safe and effective. However,  there are a few risks, which include:  · A burning sensation in the sinuses. This may happen if you do not make the saline solution as directed. Make sure to follow all directions when making the saline solution.  · Infection from contaminated water. This is rare, but possible.  · Nasal irritation.    This information is not intended to replace advice given to you by your health care provider. Make sure you discuss any questions you have with your health care provider.  Document Released: 10/12/2013 Document Revised: 02/12/2016 Document Reviewed: 08/02/2013  Elsevier Interactive Patient Education © 2017 [employer]      Sinusitis, Adult  Sinusitis is soreness and inflammation of your sinuses. Sinuses are hollow spaces in the bones around your face. They are located:  · Around your eyes.  · In the middle of your forehead.  · Behind your nose.  · In your cheekbones.    Your sinuses and nasal passages are lined with a stringy fluid (mucus). Mucus normally drains out of your sinuses. When your nasal tissues get inflamed or swollen, the mucus can get trapped or blocked so air cannot flow through your sinuses. This lets bacteria, viruses, and funguses grow, and that leads to infection.  Follow these instructions   at home:  Medicines   · Take, use, or apply over-the-counter and prescription medicines only as told by your doctor. These may include nasal sprays.  · If you were prescribed an antibiotic medicine, take it as told by your doctor. Do not stop taking the antibiotic even if you start to feel better.  Hydrate and Humidify   · Drink enough water to keep your pee (urine) clear or pale yellow.  · Use a cool mist humidifier to keep the humidity level in your home above 50%.  · Breathe in steam for 10-15 minutes, 3-4 times a day or as told by your doctor. You can do this in the bathroom while a hot shower is running.  · Try not to spend time in cool or dry air.  Rest   · Rest as much as possible.  · Sleep with your head  raised (elevated).  · Make sure to get enough sleep each night.  General instructions   · Put a warm, moist washcloth on your face 3-4 times a day or as told by your doctor. This will help with discomfort.  · Wash your hands often with soap and water. If there is no soap and water, use hand sanitizer.  · Do not smoke. Avoid being around people who are smoking (secondhand smoke).  · Keep all follow-up visits as told by your doctor. This is important.  Contact a doctor if:  · You have a fever.  · Your symptoms get worse.  · Your symptoms do not get better within 10 days.  Get help right away if:  · You have a very bad headache.  · You cannot stop throwing up (vomiting).  · You have pain or swelling around your face or eyes.  · You have trouble seeing.  · You feel confused.  · Your neck is stiff.  · You have trouble breathing.  This information is not intended to replace advice given to you by your health care provider. Make sure you discuss any questions you have with your health care provider.  Document Released: 09/03/2007 Document Revised: 11/11/2015 Document Reviewed: 01/10/2015  Elsevier Interactive Patient Education © 2017 Elsevier Inc.

## 2016-07-29 NOTE — Progress Notes (Signed)
Subjective:    Patient ID: Jennifer Erickson, female    DOB: January 26, 1971, 46 y.o.   MRN: [id number]  HPI 46 year old female comes in today with 4 days of headache, sinus congestion, cough and drainage. She is getting a brown colored sputum.   No facial pain. Taking Mucinex and Nyquail .  + chills.  She tends to get diarrhea easily with antitbiotic.   Glennon Mac started an old antibiotic about 4 days ago but had to stop it about 2 days ago because it started causing diarrhea. It was Augmentin.  Also has some dysuria for a few days. She says she actually feels like it's a little better today but wants to make sure that she doesn't have a UTI. No hematuria.    Review of Systems  BP 133/81   Pulse (!) 117   Temp 99.9 F (37.7 C)   Ht 5' 6"  (1.676 m)   Wt 264 lb (119.7 kg)   SpO2 99%   BMI 42.61 kg/m     Allergies  Allergen Reactions  . Aspirin Other (See Comments)    GI issues  . Augmentin [Amoxicillin-Pot Clavulanate] Diarrhea  . Ciprofloxacin     Nausea and vomiting.   [name] Hydrocodone     Cough syrup  . Ivp Dye [Iodinated Diagnostic Agents]     Past Medical History:  Diagnosis Date  . Personal history of amenorrhea   . Sinusitis     Past Surgical History:  Procedure Laterality Date  . DILATION AND CURETTAGE OF UTERUS    . TONSILLECTOMY      Social History   Social History  . Marital status: Married    Spouse name: N/A  . Number of children: N/A  . Years of education: N/A   Occupational History  . Not on file.   Social History Main Topics  . Smoking status: Never Smoker  . Smokeless tobacco: Never Used  . Alcohol use No  . Drug use: No  . Sexual activity: Yes    Birth control/ protection: Pill   Other Topics Concern  . Not on file   Social History Narrative  . No narrative on file    Family History  Problem Relation Age of Onset  . Cancer Mother   . Hypertension Father   . Diabetes Brother     Outpatient Encounter Prescriptions as of  07/29/2016  Medication Sig  . etonogestrel (IMPLANON) 68 MG IMPL implant Inject 1 each into the skin once.  . Vitamin D, Ergocalciferol, (DRISDOL) 50000 units CAPS capsule Take 1 capsule (50,000 Units total) by mouth every 7 (seven) days.  [name] EPINEPHrine (EPIPEN 2-PAK) 0.3 mg/0.3 mL IJ SOAJ injection INJECT 0.3 MLS (0.3 MG TOTAL) INTO THE MUSCLE ONCE.  . fluconazole (DIFLUCAN) 150 MG tablet Take 1 tablet (150 mg total) by mouth once. Can repeat dose if needed.  . sulfamethoxazole-trimethoprim (BACTRIM DS,SEPTRA DS) 800-160 MG tablet Take 1 tablet by mouth 2 (two) times daily.   No facility-administered encounter medications on file as of 07/29/2016.          Objective:   Physical Exam  Constitutional: She is oriented to person, place, and time. She appears well-developed and well-nourished.  HENT:  Head: Normocephalic and atraumatic.  Right Ear: External ear normal.  Left Ear: External ear normal.  Nose: Nose normal.  Mouth/Throat: Oropharynx is clear and moist.  TMs and canals are clear. Tonsils are absent. No facial tenderness  Eyes: Conjunctivae and EOM are normal. Pupils are equal,  round, and reactive to light.  Neck: Neck supple. No thyromegaly present.  Cardiovascular: Normal rate, regular rhythm and normal heart sounds.   Pulmonary/Chest: Effort normal and breath sounds normal. She has no wheezes.  Lymphadenopathy:    She has no cervical adenopathy.  Neurological: She is alert and oriented to person, place, and time.  Skin: Skin is warm and dry.  Psychiatric: She has a normal mood and affect.        Assessment & Plan:  Acute sinusitis - She's only had symptoms for 4 days but has had brown discharge from her nose that entire time and she's been running a low-grade temperature. Hair on the side of treating her early. Recommend a trial of nasal saline rinse. She gets diarrhea with Augmentin 10 so we'll treat with azithromycin. Can use Tylenol or ibuprofen as needed for pain  control and fever relief. Treat with Bactrim. This isn't first line visit she also has UTI should hopefully improve those. If not follow-up with PCP.  She also requests for something to be sent over for possible yeast infection as she typically gets one when she takes antibiotics.  Dysuria - UA is positive. Will tx with Bactrim.

## 2016-07-29 NOTE — Addendum Note (Signed)
Addended by: Teddy Spike on: 07/29/2016 05:53 PM   Modules accepted: Orders

## 2016-10-27 ENCOUNTER — Ambulatory Visit: Payer: Federal, State, Local not specified - PPO | Admitting: Osteopathic Medicine

## 2016-10-27 DIAGNOSIS — Z0189 Encounter for other specified special examinations: Secondary | ICD-10-CM

## 2016-10-28 ENCOUNTER — Encounter: Payer: Self-pay | Admitting: Physician Assistant

## 2016-10-28 ENCOUNTER — Ambulatory Visit (INDEPENDENT_AMBULATORY_CARE_PROVIDER_SITE_OTHER): Payer: Federal, State, Local not specified - PPO | Admitting: Physician Assistant

## 2016-10-28 VITALS — BP 126/81 | HR 97 | Temp 98.2°F | Ht 66.0 in | Wt 269.6 lb

## 2016-10-28 DIAGNOSIS — K1379 Other lesions of oral mucosa: Secondary | ICD-10-CM | POA: Diagnosis not present

## 2016-10-28 DIAGNOSIS — Z7689 Persons encountering health services in other specified circumstances: Secondary | ICD-10-CM | POA: Diagnosis not present

## 2016-10-28 MED ORDER — EPINEPHRINE 0.3 MG/0.3ML IJ SOAJ: INTRAMUSCULAR | 4 refills | Status: DC

## 2016-10-28 NOTE — Progress Notes (Signed)
HPI:                                                                Jennifer Erickson is a 46 y.o. female who presents to [facility] today for medical clearance to return to work  Patient reports her husband passed away end of 2022/07/26 unexpectedly. She took a personal leave from work for grief and is presenting today for clearance to return. She denies depressed mood or anhedonia. Denies suicidal or homicidal ideation. She has no history of anxiety or mood disorder. She reports she has good supports at home and family staying with her.  She is also requesting a refill of her Epi-pen for uvular swelling with allergies.  Past Medical History:  Diagnosis Date  . Personal history of amenorrhea   . Sinusitis    Past Surgical History:  Procedure Laterality Date  . DILATION AND CURETTAGE OF UTERUS    . TONSILLECTOMY     Social History  Substance Use Topics  . Smoking status: Never Smoker  . Smokeless tobacco: Never Used  . Alcohol use No   family history includes Cancer in her mother; Diabetes in her brother; Hypertension in her father.  ROS: negative except as noted in the HPI  Medications: Current Outpatient Prescriptions  Medication Sig Dispense Refill  . etonogestrel (IMPLANON) 68 MG IMPL implant Inject 1 each into the skin once.    . Vitamin D, Ergocalciferol, (DRISDOL) 50000 units CAPS capsule Take 1 capsule (50,000 Units total) by mouth every 7 (seven) days. 12 capsule 0  . EPINEPHrine (EPIPEN 2-PAK) 0.3 mg/0.3 mL IJ SOAJ injection INJECT 0.3 MLS (0.3 MG TOTAL) INTO THE MUSCLE ONCE. (Patient not taking: Reported on 10/28/2016) 1 Device 4   No current facility-administered medications for this visit.    Allergies  Allergen Reactions  . Aspirin Other (See Comments)    GI issues  . Augmentin [Amoxicillin-Pot Clavulanate] Diarrhea  . Ciprofloxacin     Nausea and vomiting.   [name] Hydrocodone     Cough syrup  . Ivp Dye  [Iodinated Diagnostic Agents]        Objective:  BP 126/81   Pulse 97   Temp 98.2 F (36.8 C)   Ht 5' 6"  (1.676 m)   Wt 269 lb 9.6 oz (122.3 kg)   SpO2 98%   BMI 43.51 kg/m  Gen: well-groomed, cooperative, not ill-appearing, no distress HEENT: normal conjunctiva, trachea midline Pulm: Normal work of breathing, normal phonation Neuro: alert and oriented x 3,  no tremor MSK: extremities atraumatic, normal gait and station Psych: good eye contact, euthymic mood, affect mood-congruent, normal speech and thought content   No results found for this or any previous visit (from the past 72 hour(s)). No results found.  Depression screen [facility] 2/9 10/28/2016 10/28/2016 06/30/2016  Decreased Interest 0 0 0  Down, Depressed, Hopeless 0 0 0  PHQ - 2 Score 0 0 0     Assessment and Plan: 46 y.o. female with   1. Return to work evaluation - PHQ2 negative - patient denies SI/HI - letter for work signed  2. Uvular swelling - EPINEPHrine (EPIPEN 2-PAK) 0.3 mg/0.3 mL IJ SOAJ injection; INJECT 0.3 MLS (0.3 MG TOTAL) INTO THE MUSCLE ONCE.  Dispense: 1 Device; Refill: 4  Patient education and anticipatory guidance given Patient agrees with treatment plan Follow-up as needed if symptoms worsen or fail to improve  Darlyne Russian PA-C

## 2016-12-02 ENCOUNTER — Ambulatory Visit: Payer: Federal, State, Local not specified - PPO | Admitting: Sports Medicine

## 2016-12-02 DIAGNOSIS — Z0189 Encounter for other specified special examinations: Secondary | ICD-10-CM

## 2016-12-03 ENCOUNTER — Ambulatory Visit (INDEPENDENT_AMBULATORY_CARE_PROVIDER_SITE_OTHER): Payer: Federal, State, Local not specified - PPO

## 2016-12-03 ENCOUNTER — Ambulatory Visit (INDEPENDENT_AMBULATORY_CARE_PROVIDER_SITE_OTHER): Payer: Federal, State, Local not specified - PPO | Admitting: Sports Medicine

## 2016-12-03 ENCOUNTER — Encounter: Payer: Self-pay | Admitting: Sports Medicine

## 2016-12-03 DIAGNOSIS — M25511 Pain in right shoulder: Secondary | ICD-10-CM | POA: Diagnosis not present

## 2016-12-03 DIAGNOSIS — M7582 Other shoulder lesions, left shoulder: Secondary | ICD-10-CM | POA: Diagnosis not present

## 2016-12-03 DIAGNOSIS — M7711 Lateral epicondylitis, right elbow: Secondary | ICD-10-CM | POA: Insufficient documentation

## 2016-12-03 DIAGNOSIS — M7712 Lateral epicondylitis, left elbow: Secondary | ICD-10-CM

## 2016-12-03 DIAGNOSIS — M25512 Pain in left shoulder: Principal | ICD-10-CM

## 2016-12-03 DIAGNOSIS — E66813 Obesity, class 3: Secondary | ICD-10-CM

## 2016-12-03 DIAGNOSIS — M19011 Primary osteoarthritis, right shoulder: Secondary | ICD-10-CM | POA: Diagnosis not present

## 2016-12-03 DIAGNOSIS — N62 Hypertrophy of breast: Secondary | ICD-10-CM

## 2016-12-03 DIAGNOSIS — M19012 Primary osteoarthritis, left shoulder: Secondary | ICD-10-CM | POA: Diagnosis not present

## 2016-12-03 NOTE — Assessment & Plan Note (Signed)
X-rays, Aleve, formal PT. Return in 6 weeks, injections if no better.  Pain is predominantly impingement in nature.

## 2016-12-03 NOTE — Assessment & Plan Note (Signed)
48J breasts. She is a good candidate for breast reduction with lift, because we are going to help her with aggressive weight loss I would like for her weight plateau before we refer her for breast reduction.

## 2016-12-03 NOTE — Assessment & Plan Note (Signed)
Severe pain, failed conservative measures at home. Injection as above, home rehabilitation exercises given. Return in one month for this.

## 2016-12-03 NOTE — Progress Notes (Addendum)
Subjective:    CC: Left elbow pain  HPI: For the past several weeks this pleasant 46 year old female with a history of right tennis elbow has had severe pain in her left lateral elbow, worse with gripping motions. Severe, worsening, localized at the lateral epicondyle, desires interventional treatment today. She's taken NSAIDs, warned the Force brace without any improvement. She's been trying to do the rehabilitation exercises as well but it's seemingly too painful.  Obesity: Agreeable to discuss this with bariatric surgery  Macromastia: Has 48J sized breasts, these are pendulous, causing significant neck pain, bruising and grooving over the shoulders from the bra strap, she does understand that she needs to proceed with aggressive weight loss before considering breast reduction and lift.  Shoulder pain: Bilateral, localized over the deltoids on both sides, worse with overhead activities.  Past medical history:  Negative.  See flowsheet/record as well for more information.  Surgical history: Negative.  See flowsheet/record as well for more information.  Family history: Negative.  See flowsheet/record as well for more information.  Social history: Negative.  See flowsheet/record as well for more information.  Allergies, and medications have been entered into the medical record, reviewed, and no changes needed.   Review of Systems: No fevers, chills, night sweats, weight loss, chest pain, or shortness of breath.   Objective:    General: Well Developed, well nourished, and in no acute distress.  Neuro: Alert and oriented x3, extra-ocular muscles intact, sensation grossly intact.  HEENT: Normocephalic, atraumatic, pupils equal round reactive to light, neck supple, no masses, no lymphadenopathy, thyroid nonpalpable.  Skin: Warm and dry, no rashes. Cardiac: Regular rate and rhythm, no murmurs rubs or gallops, no lower extremity edema.  Respiratory: Clear to auscultation bilaterally. Not using  accessory muscles, speaking in full sentences. Bilateral shoulders: Inspection reveals no abnormalities, atrophy or asymmetry. Palpation is normal with no tenderness over AC joint or bicipital groove. ROM is full in all planes. Rotator cuff strength normal throughout. Positive Neer and Hawkin's tests, empty can. Speeds and Yergason's tests normal. No labral pathology noted with negative Obrien's, negative crank, negative clunk, and good stability. Normal scapular function observed. No painful arc and no drop arm sign. No apprehension sign  Procedure: Real-time Ultrasound Guided Injection of left common extensor tendon origin Device: GE Logiq E  Verbal informed consent obtained.  Time-out conducted.  Noted no overlying erythema, induration, or other signs of local infection.  Skin prepped in a sterile fashion.  Local anesthesia: Topical Ethyl chloride.  With sterile technique and under real time ultrasound guidance:  Using a 25-gauge needle injected 1 mL Kenalog 40, 1 mL lidocaine, 1 mL bupivacaine both superficial to and deep to the common extensor tendon origin Completed without difficulty  Pain immediately resolved suggesting accurate placement of the medication.  Advised to call if fevers/chills, erythema, induration, drainage, or persistent bleeding.  Images permanently stored and available for review in the ultrasound unit.  Impression: Technically successful ultrasound guided injection.  Impression and Recommendations:    Left lateral epicondylitis Severe pain, failed conservative measures at home. Injection as above, home rehabilitation exercises given. Return in one month for this.  Bilateral shoulder pain X-rays, Aleve, formal PT. Return in 6 weeks, injections if no better.  Pain is predominantly impingement in nature.  Obesity, Class III, BMI 40-49.9 (morbid obesity) With multiple orthopedic related comorbidities including degenerative disc disease, lower extremity  osteoarthritis. Obstructive sleep apnea. Referral for bariatric surgery. After she loses a lot of weight she will likely  be a good candidate for breast reduction and lift.  Large breasts 48J breasts. She is a good candidate for breast reduction with lift, because we are going to help her with aggressive weight loss I would like for her weight plateau before we refer her for breast reduction.   ___________________________________________ Gwen Her. Dianah Field, M.D., ABFM., CAQSM. Primary Care and [employer] of [employer] [facility] [employer]

## 2016-12-03 NOTE — Assessment & Plan Note (Signed)
With multiple orthopedic related comorbidities including degenerative disc disease, lower extremity osteoarthritis. Obstructive sleep apnea. Referral for bariatric surgery. After she loses a lot of weight she will likely be a good candidate for breast reduction and lift.

## 2016-12-04 ENCOUNTER — Ambulatory Visit: Payer: Federal, State, Local not specified - PPO | Admitting: Sports Medicine

## 2016-12-12 ENCOUNTER — Ambulatory Visit: Payer: Federal, State, Local not specified - PPO | Admitting: Sports Medicine

## 2016-12-15 ENCOUNTER — Encounter: Payer: Self-pay | Admitting: Sports Medicine

## 2016-12-15 ENCOUNTER — Ambulatory Visit (INDEPENDENT_AMBULATORY_CARE_PROVIDER_SITE_OTHER): Payer: Federal, State, Local not specified - PPO | Admitting: Sports Medicine

## 2016-12-15 DIAGNOSIS — M7712 Lateral epicondylitis, left elbow: Secondary | ICD-10-CM

## 2016-12-15 DIAGNOSIS — M7711 Lateral epicondylitis, right elbow: Secondary | ICD-10-CM | POA: Diagnosis not present

## 2016-12-15 MED FILL — EPINEPHRINE 0.3 MG AUTO-INJ: 0.3 | 2 days supply | Qty: 2 | Fill #0

## 2016-12-15 NOTE — Progress Notes (Signed)
  Subjective:    CC: Follow-up  HPI: Shoulders: Still hurting, has not yet started physical therapy.  Tennis elbow: Fantastic response to injection on the left, repeated on the right, pain is moderate, persistent, localized without radiation, worse with gripping and extension of the wrist.  Past medical history:  Negative.  See flowsheet/record as well for more information.  Surgical history: Negative.  See flowsheet/record as well for more information.  Family history: Negative.  See flowsheet/record as well for more information.  Social history: Negative.  See flowsheet/record as well for more information.  Allergies, and medications have been entered into the medical record, reviewed, and no changes needed.   Review of Systems: No fevers, chills, night sweats, weight loss, chest pain, or shortness of breath.   Objective:    General: Well Developed, well nourished, and in no acute distress.  Neuro: Alert and oriented x3, extra-ocular muscles intact, sensation grossly intact.  HEENT: Normocephalic, atraumatic, pupils equal round reactive to light, neck supple, no masses, no lymphadenopathy, thyroid nonpalpable.  Skin: Warm and dry, no rashes. Cardiac: Regular rate and rhythm, no murmurs rubs or gallops, no lower extremity edema.  Respiratory: Clear to auscultation bilaterally. Not using accessory muscles, speaking in full sentences. Right Elbow: Unremarkable to inspection. Range of motion full pronation, supination, flexion, extension. Strength is full to all of the above directions Stable to varus, valgus stress. Negative moving valgus stress test. Tender to palpation at the common extensor tendon origin, reproduction of pain with resisted extension of the middle finger and the wrist. Ulnar nerve does not sublux. Negative cubital tunnel Tinel's.  Procedure: Real-time Ultrasound Guided Injection of right common extensor tendon origin Device: GE Logiq E  Verbal informed consent  obtained.  Time-out conducted.  Noted no overlying erythema, induration, or other signs of local infection.  Skin prepped in a sterile fashion.  Local anesthesia: Topical Ethyl chloride.  With sterile technique and under real time ultrasound guidance:  Using a 25-gauge needle injected 1 mL Kenalog 40, 1 mL lidocaine, 1 mL bupivacaine both superficial to and deep to the common extensor tendon origin Completed without difficulty  Pain immediately resolved suggesting accurate placement of the medication.  Advised to call if fevers/chills, erythema, induration, drainage, or persistent bleeding.  Images permanently stored and available for review in the ultrasound unit.  Impression: Technically successful ultrasound guided injection.  Impression and Recommendations:    Lateral epicondylitis of both elbows Fantastic response to a common extensor tendon origin injection on the left, repeated on the right, she does need to do physical therapy for her elbows and shoulders. I did fill out FMLA paperwork today.  ___________________________________________ Gwen Her. Dianah Field, M.D., ABFM., CAQSM. Primary Care and [place] Instructor of [place] [facility]

## 2016-12-15 NOTE — Assessment & Plan Note (Signed)
Fantastic response to a common extensor tendon origin injection on the left, repeated on the right, she does need to do physical therapy for her elbows and shoulders. I did fill out FMLA paperwork today.

## 2016-12-22 ENCOUNTER — Ambulatory Visit (INDEPENDENT_AMBULATORY_CARE_PROVIDER_SITE_OTHER): Payer: Federal, State, Local not specified - PPO | Admitting: Sports Medicine

## 2016-12-22 ENCOUNTER — Encounter: Payer: Self-pay | Admitting: Sports Medicine

## 2016-12-22 VITALS — BP 127/83 | HR 94 | Wt 264.0 lb

## 2016-12-22 DIAGNOSIS — E559 Vitamin D deficiency, unspecified: Secondary | ICD-10-CM

## 2016-12-22 DIAGNOSIS — R252 Cramp and spasm: Secondary | ICD-10-CM

## 2016-12-22 DIAGNOSIS — M5416 Radiculopathy, lumbar region: Secondary | ICD-10-CM | POA: Diagnosis not present

## 2016-12-22 DIAGNOSIS — Z13220 Encounter for screening for lipoid disorders: Secondary | ICD-10-CM | POA: Diagnosis not present

## 2016-12-22 NOTE — Progress Notes (Addendum)
   Subjective:    I'm seeing this patient as a consultation for:  Iran Planas, PA-C  CC: Left foot cramping  HPI: This is a pleasant 46 year old female, for the past several months she's noted increasing cramping in her left dorsal foot, she tried magnesium initially with some benefit but afterwards it failed to cause improvement. She does have back pain and we have worked her up for lumbar degenerative disc changes in the recent past, no bowel or bladder dysfunction, saddle numbness, constitutional symptoms.  Knee osteoarthritis: Previous injection was 4-5 months ago, she tells me at the next visit she will probably proceed.  Past medical history, Surgical history, Family history not pertinant except as noted below, Social history, Allergies, and medications have been entered into the medical record, reviewed, and no changes needed.   Review of Systems: No headache, visual changes, nausea, vomiting, diarrhea, constipation, dizziness, abdominal pain, skin rash, fevers, chills, night sweats, weight loss, swollen lymph nodes, body aches, joint swelling, muscle aches, chest pain, shortness of breath, mood changes, visual or auditory hallucinations.   Objective:   General: Well Developed, well nourished, and in no acute distress.  Neuro:  Extra-ocular muscles intact, able to move all 4 extremities, sensation grossly intact.  Deep tendon reflexes tested were normal. Psych: Alert and oriented, mood congruent with affect. ENT:  Ears and nose appear unremarkable.  Hearing grossly normal. Neck: Unremarkable overall appearance, trachea midline.  No visible thyroid enlargement. Eyes: Conjunctivae and lids appear unremarkable.  Pupils equal and round. Skin: Warm and dry, no rashes noted.  Cardiovascular: Pulses palpable, no extremity edema. Left Foot: No visible erythema or swelling. Range of motion is full in all directions. Strength is 5/5 in all directions. No hallux valgus. No pes cavus or  pes planus. No abnormal callus noted. No pain over the navicular prominence, or base of fifth metatarsal. No tenderness to palpation of the calcaneal insertion of plantar fascia. No pain at the Achilles insertion. No pain over the calcaneal bursa. No pain of the retrocalcaneal bursa. No tenderness to palpation over the tarsals, metatarsals, or phalanges. No hallux rigidus or limitus. No tenderness palpation over interphalangeal joints. No pain with compression of the metatarsal heads. Neurovascularly intact distally.  Impression and Recommendations:   This case required medical decision making of moderate complexity.  Cramps of left lower extremity Persistent cramps of the left foot. Taking magnesium. Checking for and electrolyte abnormalities, if no abnormalities we will further investigate the lumbar spine.  No electrolyte abnormalities, further investigation of the lumbar spine with an MRI.  Left lumbar radiculitis Left-sided leg cramps, consistent with lumbar radiculopathy, electrolytes were normal, magnesium and gabapentin no longer effective. She has done physical therapy, had x-rays. MRI for interventional planning.  ___________________________________________ Gwen Her. Dianah Field, M.D., ABFM., CAQSM. Primary Care and [place] Instructor of [place] of [facility]

## 2016-12-22 NOTE — Assessment & Plan Note (Addendum)
Persistent cramps of the left foot. Taking magnesium. Checking for and electrolyte abnormalities, if no abnormalities we will further investigate the lumbar spine.  No electrolyte abnormalities, further investigation of the lumbar spine with an MRI.

## 2016-12-23 LAB — CBC
HCT: 41.3 % (ref 35.0–45.0)
Hemoglobin: 14.1 g/dL (ref 11.7–15.5)
MCH: 30.1 pg (ref 27.0–33.0)
MCHC: 34.1 g/dL (ref 32.0–36.0)
MCV: 88.2 fL (ref 80.0–100.0)
MPV: 9.8 fL (ref 7.5–12.5)
Platelets: 322 Thousand/uL (ref 140–400)
RBC: 4.68 Million/uL (ref 3.80–5.10)
RDW: 12.3 % (ref 11.0–15.0)
WBC: 9.3 10*3/uL (ref 3.8–10.8)

## 2016-12-23 LAB — LIPID PANEL
CHOL/HDL RATIO: 2.9 (calc) (ref <5.0)
Cholesterol: 182 mg/dL (ref <200)
HDL: 62 mg/dL (ref >50)
LDL Cholesterol (Calc): 104 mg/dL (calc) — ABNORMAL HIGH
NON-HDL CHOLESTEROL (CALC): 120 mg/dL (ref <130)
TRIGLYCERIDES: 74 mg/dL (ref <150)

## 2016-12-23 LAB — COMPREHENSIVE METABOLIC PANEL WITH GFR
Albumin: 3.9 g/dL (ref 3.6–5.1)
BUN: 16 mg/dL (ref 7–25)
Creat: 0.97 mg/dL (ref 0.50–1.10)
Potassium: 4.2 mmol/L (ref 3.5–5.3)
Sodium: 140 mmol/L (ref 135–146)

## 2016-12-23 LAB — COMPREHENSIVE METABOLIC PANEL
AG Ratio: 1.1 (calc) (ref 1.0–2.5)
ALT: 32 U/L — ABNORMAL HIGH (ref 6–29)
AST: 17 U/L (ref 10–35)
Alkaline phosphatase (APISO): 110 U/L (ref 33–115)
CO2: 27 mmol/L (ref 20–32)
Calcium: 9.4 mg/dL (ref 8.6–10.2)
Chloride: 107 mmol/L (ref 98–110)
Globulin: 3.6 g/dL (calc) (ref 1.9–3.7)
Glucose, Bld: 90 mg/dL (ref 65–99)
Total Bilirubin: 0.4 mg/dL (ref 0.2–1.2)
Total Protein: 7.5 g/dL (ref 6.1–8.1)

## 2016-12-23 LAB — VITAMIN D 25 HYDROXY (VIT D DEFICIENCY, FRACTURES): Vit D, 25-Hydroxy: 20 ng/mL — ABNORMAL LOW (ref 30–100)

## 2016-12-23 LAB — MAGNESIUM: Magnesium: 2.2 mg/dL (ref 1.5–2.5)

## 2016-12-23 LAB — CK: Total CK: 90 U/L (ref 29–143)

## 2016-12-24 ENCOUNTER — Ambulatory Visit: Payer: Federal, State, Local not specified - PPO | Admitting: Physical Therapy

## 2016-12-25 NOTE — Addendum Note (Signed)
Addended by: Silverio Decamp on: 12/25/2016 09:53 AM   Modules accepted: Orders

## 2016-12-25 NOTE — Assessment & Plan Note (Signed)
Left-sided leg cramps, consistent with lumbar radiculopathy, electrolytes were normal, magnesium and gabapentin no longer effective. She has done physical therapy, had x-rays. MRI for interventional planning.

## 2016-12-26 ENCOUNTER — Ambulatory Visit (INDEPENDENT_AMBULATORY_CARE_PROVIDER_SITE_OTHER): Payer: Federal, State, Local not specified - PPO | Admitting: Physician Assistant

## 2016-12-26 ENCOUNTER — Encounter: Payer: Self-pay | Admitting: Physician Assistant

## 2016-12-26 VITALS — BP 128/80 | HR 85 | Temp 98.6°F | Wt 270.0 lb

## 2016-12-26 DIAGNOSIS — R05 Cough: Secondary | ICD-10-CM | POA: Diagnosis not present

## 2016-12-26 DIAGNOSIS — F4321 Adjustment disorder with depressed mood: Secondary | ICD-10-CM

## 2016-12-26 DIAGNOSIS — J069 Acute upper respiratory infection, unspecified: Secondary | ICD-10-CM

## 2016-12-26 DIAGNOSIS — R059 Cough, unspecified: Secondary | ICD-10-CM

## 2016-12-26 DIAGNOSIS — F4320 Adjustment disorder, unspecified: Secondary | ICD-10-CM | POA: Diagnosis not present

## 2016-12-26 MED ORDER — BENZONATATE 200 MG PO CAPS: 200.0000 mg | ORAL_CAPSULE | Freq: Two times a day (BID) | ORAL | 0 refills | Status: DC | PRN

## 2016-12-26 MED FILL — BENZONATATE 200 MG CAP: 200 | 10 days supply | Qty: 20 | Fill #0

## 2016-12-26 NOTE — Progress Notes (Addendum)
Subjective:    Patient ID: Jennifer Erickson, female    DOB: [birth year], 46 y.o.   MRN: [id number]  HPI The patient is a 46 year old female who presents to the clinic today because she believes she is getting sick. She has had a dry cough for the last three days and a sore throat for one week. She reports the cough is worst at night. She say says she has occasional chills and body aches. She has tried ibuprofen and hot tea.  She reports sick contacts at work. Denies congestion, sinus pressure, ear pain, and shortness of breath.   She has a history of seasonal allergies. She does not take medications for allergies.   She also requests a referral for behavioral health. She has experienced several deaths in the last month. She believes she is functioning well, however she does become overwhelmed at work on occasion. Denies suicidal thoughts or self harm. Her job is requesting that she sees a Social worker and provide documentation. She works seven days per week and believes she may benefit from a few days off per month when she does become overwhelmed.   .. Active Ambulatory Problems    Diagnosis Date Noted  . Obesity, Class III, BMI 40-49.9 (morbid obesity) ([place]) 08/19/2011  . Uvular swelling 12/30/2011  . Insomnia 12/30/2011  . Dermatitis 05/26/2012  . Plantar fasciitis, bilateral 11/01/2012  . Left breast mass 11/22/2012  . Obstructive sleep apnea 11/22/2012  . Bilateral swelling of feet 05/02/2013  . Left lumbar radiculitis 01/25/2014  . Vitamin D deficiency 04/19/2014  . Flank pain 11/01/2014  . Primary osteoarthritis of both knees 12/08/2014  . Large breasts 05/28/2015  . OSA on CPAP 07/10/2015  . Palpitations 07/11/2015  . 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 Cramps of left lower extremity 11/28/2015  . Polyarthralgia 02/18/2016  . Toe fracture, right 04/15/2016  . Lateral epicondylitis of both elbows 12/03/2016  . Bilateral shoulder pain 12/03/2016  . Grief reaction 12/26/2016    Resolved Ambulatory Problems    Diagnosis Date Noted  . Headache(784.0) 08/19/2011  . Uvular swelling 08/19/2011  . Right-sided low back pain without sciatica 05/07/2015  . Lateral epicondylitis (tennis elbow) 05/28/2015   Past Medical History:  Diagnosis Date  . Personal history of amenorrhea   . Sinusitis      Review of Systems  All other systems reviewed and are negative.      Objective:   Physical Exam  Constitutional: She is oriented to person, place, and time. She appears well-developed and well-nourished.  HENT:  Head: Normocephalic and atraumatic.  Right Ear: External ear normal.  Left Ear: External ear normal.  Nose: Nose normal.  Mouth/Throat: Oropharynx is clear and moist.  No frontal or maxillary sinus pressure.  Eyes: Conjunctivae are normal. Right eye exhibits no discharge. Left eye exhibits no discharge.  Neck: Normal range of motion. Neck supple.  Cardiovascular: Normal rate, regular rhythm and normal heart sounds.   Pulmonary/Chest: Effort normal and breath sounds normal. She has no wheezes.  Lymphadenopathy:    She has no cervical adenopathy.  Neurological: She is alert and oriented to person, place, and time.  Psychiatric: She has a normal mood and affect. Her behavior is normal.       Assessment & Plan:  Jennifer KitchenMarland KitchenElizabth was seen today for cough.  Diagnoses and all orders for this visit:  Acute upper respiratory infection -     benzonatate (TESSALON) 200 MG capsule; Take 1 capsule (200 mg total) by mouth  2 (two) times daily as needed for cough.  Morbid obesity (Jennifer Erickson)  Grief reaction -     Ambulatory referral to Psychology  Cough -     benzonatate (TESSALON) 200 MG capsule; Take 1 capsule (200 mg total) by mouth 2 (two) times daily as needed for cough.   I suspect a URI. She was given tessalon pearls to use at night. I also recommended Delysm to help with the cough. A referral to behavioral health was sent. We discussed the possibility of taking  days off of work when she becomes overwhelmed from the recent grief she has experienced, as she works seven days per week.   I would love to work with her on weight loss over next year.  Jennifer Erickson.Discussed low carb diet with 1500 calories and 80g of protein.  Exercising at least 150 minutes a week.  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    Jennifer KitchenSpent 30 minutes with patient and greater than 50 percent of visit spent counseling patient regarding treatment plan.

## 2016-12-26 NOTE — Addendum Note (Signed)
Addended by: Donella Stade on: 12/26/2016 12:06 PM   Modules accepted: Orders, Level of Service

## 2016-12-26 NOTE — Patient Instructions (Signed)
Delsym/zarbees Tessalon   Upper Respiratory Infection, Adult Most upper respiratory infections (URIs) are caused by a virus. A URI affects the nose, throat, and upper air passages. The most common type of URI is often called "the common cold." Follow these instructions at home:  Take medicines only as told by your doctor.  Gargle warm saltwater or take cough drops to comfort your throat as told by your doctor.  Use a warm mist humidifier or inhale steam from a shower to increase air moisture. This may make it easier to breathe.  Drink enough fluid to keep your pee (urine) clear or pale yellow.  Eat soups and other clear broths.  Have a healthy diet.  Rest as needed.  Go back to work when your fever is gone or your doctor says it is okay. ? You may need to stay home longer to avoid giving your URI to others. ? You can also wear a face mask and wash your hands often to prevent spread of the virus.  Use your inhaler more if you have asthma.  Do not use any tobacco products, including cigarettes, chewing tobacco, or electronic cigarettes. If you need help quitting, ask your doctor. Contact a doctor if:  You are getting worse, not better.  Your symptoms are not helped by medicine.  You have chills.  You are getting more short of breath.  You have brown or red mucus.  You have yellow or brown discharge from your nose.  You have pain in your face, especially when you bend forward.  You have a fever.  You have puffy (swollen) neck glands.  You have pain while swallowing.  You have white areas in the back of your throat. Get help right away if:  You have very bad or constant: ? Headache. ? Ear pain. ? Pain in your forehead, behind your eyes, and over your cheekbones (sinus pain). ? Chest pain.  You have long-lasting (chronic) lung disease and any of the following: ? Wheezing. ? Long-lasting cough. ? Coughing up blood. ? A change in your usual mucus.  You have  a stiff neck.  You have changes in your: ? Vision. ? Hearing. ? Thinking. ? Mood. This information is not intended to replace advice given to you by your health care provider. Make sure you discuss any questions you have with your health care provider. Document Released: 09/03/2007 Document Revised: 11/18/2015 Document Reviewed: 06/22/2013 Elsevier Interactive Patient Education  2018 [employer].

## 2016-12-26 NOTE — Progress Notes (Deleted)
b

## 2017-01-14 ENCOUNTER — Ambulatory Visit: Payer: Federal, State, Local not specified - PPO | Admitting: Sports Medicine

## 2017-01-21 ENCOUNTER — Ambulatory Visit (INDEPENDENT_AMBULATORY_CARE_PROVIDER_SITE_OTHER): Payer: Federal, State, Local not specified - PPO | Admitting: Physician Assistant

## 2017-01-21 VITALS — BP 132/69 | HR 84 | Ht 65.98 in | Wt 271.0 lb

## 2017-01-21 DIAGNOSIS — F4321 Adjustment disorder with depressed mood: Secondary | ICD-10-CM

## 2017-01-21 DIAGNOSIS — Z7689 Persons encountering health services in other specified circumstances: Secondary | ICD-10-CM | POA: Diagnosis not present

## 2017-01-21 NOTE — Progress Notes (Signed)
Subjective:    Patient ID: Jennifer Erickson, female    DOB: [birth year], 45 y.o.   MRN: [id number]  HPI  Pt is a 46 yo obese AA pleasant female who presents to the clinic for a note to return to work. She has taken a few weeks off at the request of her job to fully rest and grieve after the sudden loss of her husband in May. She has scheduled for counseling but not yet started. She feels ready to go back to work. She has worked at the same job for over 22 years. She loves her job. She has realized she wants to move to first shift and would be better for her health. 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 She denies any SI/HC thoughts. She is not on any medication and feels ready to go back full time.    .. Active Ambulatory Problems    Diagnosis Date Noted  . Obesity, Class III, BMI 40-49.9 (morbid obesity) ([place]) 08/19/2011  . Uvular swelling 12/30/2011  . Insomnia 12/30/2011  . Dermatitis 05/26/2012  . Plantar fasciitis, bilateral 11/01/2012  . Left breast mass 11/22/2012  . Obstructive sleep apnea 11/22/2012  . Bilateral swelling of feet 05/02/2013  . Left lumbar radiculitis 01/25/2014  . Vitamin D deficiency 04/19/2014  . Flank pain 11/01/2014  . Primary osteoarthritis of both knees 12/08/2014  . Large breasts 05/28/2015  . OSA on CPAP 07/10/2015  . Palpitations 07/11/2015  . 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 Cramps of left lower extremity 11/28/2015  . Polyarthralgia 02/18/2016  . Toe fracture, right 04/15/2016  . Lateral epicondylitis of both elbows 12/03/2016  . Bilateral shoulder pain 12/03/2016  . Grief reaction 12/26/2016   Resolved Ambulatory Problems    Diagnosis Date Noted  . Headache(784.0) 08/19/2011  . Uvular swelling 08/19/2011  . Right-sided low back pain without sciatica 05/07/2015  . Lateral epicondylitis (tennis elbow) 05/28/2015   Past Medical History:  Diagnosis Date  . Personal history of  amenorrhea   . Sinusitis       Review of Systems  All other systems reviewed and are negative.      Objective:   Physical Exam  Constitutional: She is oriented to person, place, and time. She appears well-developed and well-nourished.  Obese.   HENT:  Head: Normocephalic and atraumatic.  Cardiovascular: Normal rate, regular rhythm and normal heart sounds.   Neurological: She is alert and oriented to person, place, and time.  Psychiatric: She has a normal mood and affect. Her behavior is normal.          Assessment & Plan:  Marland KitchenMarland KitchenAmberrose was seen today for note to return to work.  Diagnoses and all orders for this visit:  Return to work evaluation  Grief reaction    Depression screen [facility] 2/9 01/21/2017 12/03/2016 10/28/2016 10/28/2016 06/30/2016  Decreased Interest 0 0 0 0 0  Down, Depressed, Hopeless 0 0 0 0 0  PHQ - 2 Score 0 0 0 0 0  Altered sleeping 1 - - - -  Tired, decreased energy 1 - - - -  Change in appetite 0 - - - -  Feeling bad or failure about yourself  0 - - - -  Trouble concentrating 0 - - - -  Moving slowly or fidgety/restless 0 - - - -  Suicidal thoughts 0 - - - -  PHQ-9 Score 2 - - - -  Difficult doing work/chores Not difficult  at all - - - -    .[name] GAD 7 : Generalized Anxiety Score 01/21/2017  Nervous, Anxious, on Edge 0  Control/stop worrying 0  Worry too much - different things 0  Trouble relaxing 1  Restless 0  Easily annoyed or irritable 1  Afraid - awful might happen 0  Total GAD 7 Score 2  Anxiety Difficulty Not difficult at all    Pt is doing well. She appears happy and active. She agrees to go to counseling for a few sessions. She needs a note to RTW with no restrictions on Monday.

## 2017-01-22 ENCOUNTER — Encounter: Payer: Self-pay | Admitting: Physician Assistant

## 2017-01-26 ENCOUNTER — Ambulatory Visit (INDEPENDENT_AMBULATORY_CARE_PROVIDER_SITE_OTHER): Payer: Federal, State, Local not specified - PPO | Admitting: Licensed Clinical Social Worker

## 2017-01-26 DIAGNOSIS — F4325 Adjustment disorder with mixed disturbance of emotions and conduct: Secondary | ICD-10-CM | POA: Diagnosis not present

## 2017-01-26 NOTE — Progress Notes (Signed)
Comprehensive Clinical Assessment (CCA) Note  01/26/2017 Jennifer Erickson [id number]  Visit Diagnosis:      ICD-10-CM   1. Adjustment disorder with mixed disturbance of emotions and conduct F43.25       CCA Part One  Part One has been completed on paper by the patient.  (See scanned document in Chart Review)  CCA Part Two A  Intake/Chief Complaint:  CCA Intake With Chief Complaint CCA Part Two Date: 01/26/17 CCA Part Two Time: 30 Chief Complaint/Presenting Problem: husband died unexpectedly this Aug 05, 2022, friends suggest it with concerns that grief sneaks up on her and something can trigger her so to see somebody just in case, getting tired people telling her that, thinks acting ways that she shouldn't be acting, for example, getting tasks done that she needs to get done Patients Currently Reported Symptoms/Problems: husband had heart attack out of the blue, people want an answer she can't give them, so particularly difficult was the funeral, people asking questions, didn't bury way family felt should, cremated, older African Americans don't care for cremation because not a proper burial, only child 41 years, mom not handling it successful, devastated as wife but not child, hasn't been back to primary job for a month, asked about her husband at work all the time, in a category now because of loss, didn't want to come, but coming just in case, needed time, days not doing well, but arranges things, still keeps busy, still a student, still do other things, still a functional human being Collateral Involvement: people can be intrusive and didn't realize how much they can be intrusive, was devastating but still has a life, supports-great friends, lives with dog Individual's Strengths: organize really well Individual's Preferences: seeing therapist just in case she runs into triggers that could make symptoms worse Individual's Abilities: likes to teach, 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[employer], likes to  study working on doctorate, likes to write, has a Engineer, building services, likes to play with dog, goes out with friends weekly Type of Services Patient Feels Are Needed: preventative to ensure her well-being Initial Clinical Notes/Concerns: Psychiatric history-has seen a therapist related to job, 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 but through health [facility]  Mental Health Symptoms Depression:  Depression: N/A (missed husband desparately, but alive and has things to do, had a lot of discussion with husband and did a not of things together, knew what they didn't want to do together)  Mania:  Mania: N/A  Anxiety:   Anxiety: N/A  Psychosis:  Psychosis: N/A  Trauma:  Trauma: N/A  Obsessions:  Obsessions: N/A  Compulsions:  Compulsions: N/A  Inattention:  Inattention: N/A  Hyperactivity/Impulsivity:  Hyperactivity/Impulsivity: N/A  Oppositional/Defiant Behaviors:  Oppositional/Defiant Behaviors: N/A  Borderline Personality:  Emotional Irregularity: N/A  Other Mood/Personality Symptoms:  Other Mood/Personality Symtpoms: Depression-they set things up financially in case something would happen to either of them, he was accountant, denies SI, SA, SIB   Mental Status Exam Appearance and self-care  Stature:  Stature: Average  Weight:  Weight: Overweight  Clothing:  Clothing: Casual  Grooming:  Grooming: Normal  Cosmetic use:  Cosmetic Use: Age appropriate  Posture/gait:  Posture/Gait: Normal  Motor activity:  Motor Activity: Not Remarkable  Sensorium  Attention:  Attention: Normal  Concentration:  Concentration: Normal  Orientation:  Orientation: X5  Recall/memory:  Recall/Memory: Normal  Affect and Mood  Affect:  Affect: Appropriate  Mood:  Mood: Irritable (neutral, just surprised to be put in one category and mult-facted person)  Relating  Eye contact:  Eye Contact: Normal  Facial expression:  Facial Expression: Responsive  Attitude toward examiner:  Attitude Toward Examiner:  Cooperative  Thought and Language  Speech flow: Speech Flow: Normal  Thought content:  Thought Content: Appropriate to mood and circumstances  Preoccupation:     Hallucinations:     Organization:     Transport planner of Knowledge:  Fund of Knowledge: Average  Intelligence:  Intelligence: Average  Abstraction:  Abstraction: Normal  Judgement:  Judgement: Normal  Reality Testing:  Reality Testing: Realistic  Insight:  Insight: Good  Decision Making:  Decision Making: Normal  Social Functioning  Social Maturity:  Social Maturity: Responsible  Social Judgement:  Social Judgement: Normal  Stress  Stressors:  Stressors:  (social-people asking you if you are okay over and over)  Coping Ability:  Coping Ability: Normal  Skill Deficits:     Supports:      Family and Psychosocial History: Family history Marital status: Widowed Widowed, when?: Widowed in april Are you sexually active?: No What is your sexual orientation?: heteroscual Has your sexual activity been affected by drugs, alcohol, medication, or emotional stress?: n/a Does patient have children?: No  Childhood History:  Childhood History By whom was/is the patient raised?: Both parents Additional childhood history information: great Description of patient's relationship with caregiver when they were a child: mom, dad-great, strict Patient's description of current relationship with people who raised him/her: mom-little ill, get along great, dad deceased How were you disciplined when you got in trouble as a child/adolescent?: normal disciplien for their time, three total spankings, disappointing with paretns was devastated in themselves, Does patient have siblings?: Yes Number of Siblings: 3 Description of patient's current relationship with siblings: 2 brother and 1 sister, second oldest Did patient suffer any verbal/emotional/physical/sexual abuse as a child?: No Did patient suffer from severe childhood neglect?:  No Has patient ever been sexually abused/assaulted/raped as an adolescent or adult?: No Was the patient ever a victim of a crime or a disaster?: No Witnessed domestic violence?: No Has patient been effected by domestic violence as an adult?: No  CCA Part Two B  Employment/Work Situation: Employment / Work Copywriter, advertising Employment situation: Employed Where is patient currently employed?: 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, 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, 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[employer], 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 How long has patient been employed?: post Archivist, [employer]-2 years, 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, negotiation, Optician, dispensing, organizational behavior Patient's job has been impacted by current illness: No (great job, early retire, working on another career, education, teaching thing, has learned to live life when you have it, 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 but doesn't want to throw away a job she has been at for so long) What is the longest time patient has a held a job?: 22 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 Has patient ever been in the [employer]?: No Has patient ever served in combat?: No Did You Receive Any Psychiatric Treatment/Services While in the [place]?: No Are There Guns or Other Weapons in [place]?: No  Education: Education School Currently Attending: online [employer] in [place] Last Grade Completed: 22.5 Name of [place]: Cipriano Bunker Did You Graduate From [employer]?: Yes Did [place]?: Yes What Type of College Degree Do you Have?: history, religious studies, peace and conflict studies, B.A Did You Attend [place]?: Yes What is Your Post Graduate Degree?: Master in [place],  MA in science, negotion and dispute resolution at [employer], working on [place] with empahsis on conflict management What Was Your Major?: see above Did You Have Any Special Interests In School?: see  above Did You Have An Individualized Education Program (IIEP): No Did You Have Any Difficulty At School?: No  Religion: Religion/Spirituality Are You A Religious Person?: Yes What is Your Religious Affiliation?: Baptist How Might This Affect Treatment?: no  Leisure/Recreation: Leisure / Recreation Leisure and Hobbies: see above  Exercise/Diet: Exercise/Diet Do You Exercise?: Yes What Type of Exercise Do You Do?: Other (Comment), Run/Walk ([place]) How Many Times a Week Do You Exercise?: 6-7 times a week Have You Gained or Lost A Significant Amount of Weight in the Past Six Months?: No Do You Follow a Special Diet?: No Do You Have Any Trouble Sleeping?: Yes Explanation of Sleeping Difficulties: trouble sleeping, has to break up sleep, between classes, work, work at night, [employer], [place]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  CCA Part Two C  Alcohol/Drug Use: Alcohol / Drug Use Pain Medications: n/a Prescriptions: see med list Over the Counter: see med list History of alcohol / drug use?: No history of alcohol / drug abuse                      CCA Part Three  ASAM's:  Six Dimensions of Multidimensional Assessment  Dimension 1:  Acute Intoxication and/or Withdrawal Potential:     Dimension 2:  Biomedical Conditions and Complications:     Dimension 3:  Emotional, Behavioral, or Cognitive Conditions and Complications:     Dimension 4:  Readiness to Change:     Dimension 5:  Relapse, Continued use, or Continued Problem Potential:     Dimension 6:  Recovery/Living Environment:      Substance use Disorder (SUD)    Social Function:  Social Functioning Social Maturity: Responsible Social Judgement: Normal  Stress:  Stress Stressors:  (social-people asking you if you are okay over and over) Coping Ability: Normal Patient Takes Medications The Way The Doctor Instructed?: Yes Priority Risk: Low Acuity  Risk Assessment- Self-Harm Potential: Risk Assessment  For Self-Harm Potential Thoughts of Self-Harm: No current thoughts Method: No plan Availability of Means: No access/NA  Risk Assessment -Dangerous to Others Potential: Risk Assessment For Dangerous to Others Potential Method: No Plan Availability of Means: No access or NA Intent: Vague intent or NA Notification Required: No need or identified person  DSM5 Diagnoses: Patient Active Problem List   Diagnosis Date Noted  . Grief reaction 12/26/2016  . Lateral epicondylitis of both elbows 12/03/2016  . Bilateral shoulder pain 12/03/2016  . Toe fracture, right 04/15/2016  . Polyarthralgia 02/18/2016  . Cramps of left lower extremity 11/28/2015  . 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 Palpitations 07/11/2015  . OSA on CPAP 07/10/2015  . Large breasts 05/28/2015  . Primary osteoarthritis of both knees 12/08/2014  . Flank pain 11/01/2014  . Vitamin D deficiency 04/19/2014  . Left lumbar radiculitis 01/25/2014  . Bilateral swelling of feet 05/02/2013  . Left breast mass 11/22/2012  . Obstructive sleep apnea 11/22/2012  . Plantar fasciitis, bilateral 11/01/2012  . Dermatitis 05/26/2012  . Uvular swelling 12/30/2011  . Insomnia 12/30/2011  . Obesity, Class III, BMI 40-49.9 (morbid obesity) ([place]) 08/19/2011    Patient Centered Plan: Patient is on the following Treatment Plan(s):  N/a-no individual therapy recommended at the current time related to patient's report of no severity of mental health symptoms and her  report of ability to cope adequately with loss of husband  Recommendations for Services/Supports/Treatments: Recommendations for Services/Supports/Treatments Recommendations For Services/Supports/Treatments:  (no therapy is recommended at this time)  Treatment Plan Summary: patient is a 46 year old widowed female who lost her husband unexpectedly in April due to heart attack. She relates coming to assessment for preventative reasons in case she is triggered that leads to increase in  symptoms as well as because of encouragement of her supports. She reports no significant depressive symptoms, PHQ-9=0,relates depression from loss but also recognition that her life must go on and she has things to do. 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 and also took time off from one of her 2 jobs at [employer] to help her in making adjustment to her loss. She denies symptoms of anxiety(GAD-7=0), denies SI, past SA SIB, HI or substance abuse. Therapy is not recommended at current time based on patient's report of no severe mental health symptoms, her apparent adjusting through returning to daily life tasks and positive comments about her current and future goals. Patient is in agreement with this recommendation and agrees to return if she experiences worsening of symptoms    Referrals to Alternative Service(s): Referred to Alternative Service(s):   Place:   Date:   Time:    Referred to Alternative Service(s):   Place:   Date:   Time:    Referred to Alternative Service(s):   Place:   Date:   Time:    Referred to Alternative Service(s):   Place:   Date:   Time:     Cordella Register

## 2017-03-04 ENCOUNTER — Encounter: Payer: Self-pay | Admitting: Physician Assistant

## 2017-03-04 ENCOUNTER — Ambulatory Visit (INDEPENDENT_AMBULATORY_CARE_PROVIDER_SITE_OTHER): Payer: Federal, State, Local not specified - PPO | Admitting: Physician Assistant

## 2017-03-04 VITALS — BP 149/92 | HR 93 | Temp 98.8°F | Ht 66.0 in | Wt 269.0 lb

## 2017-03-04 DIAGNOSIS — R197 Diarrhea, unspecified: Secondary | ICD-10-CM

## 2017-03-04 DIAGNOSIS — J019 Acute sinusitis, unspecified: Secondary | ICD-10-CM

## 2017-03-04 DIAGNOSIS — B9789 Other viral agents as the cause of diseases classified elsewhere: Secondary | ICD-10-CM | POA: Diagnosis not present

## 2017-03-04 DIAGNOSIS — R112 Nausea with vomiting, unspecified: Secondary | ICD-10-CM | POA: Diagnosis not present

## 2017-03-04 DIAGNOSIS — K529 Noninfective gastroenteritis and colitis, unspecified: Secondary | ICD-10-CM | POA: Diagnosis not present

## 2017-03-04 MED ORDER — METHYLPREDNISOLONE SODIUM SUCC 125 MG IJ SOLR
125.0000 mg | Freq: Once | INTRAMUSCULAR | Status: AC
  Administered 2017-03-04: 125 mg via INTRAMUSCULAR

## 2017-03-04 MED ORDER — PREDNISONE 50 MG PO TABS: ORAL_TABLET | ORAL | 0 refills | Status: DC

## 2017-03-04 MED ORDER — PROMETHAZINE HCL 25 MG/ML IJ SOLN
12.5000 mg | Freq: Once | INTRAMUSCULAR | Status: AC
  Administered 2017-03-04: 12.5 mg via INTRAMUSCULAR

## 2017-03-04 MED ORDER — ONDANSETRON HCL 4 MG PO TABS: 4.0000 mg | ORAL_TABLET | Freq: Three times a day (TID) | ORAL | 0 refills | Status: DC | PRN

## 2017-03-04 MED FILL — predniSONE 50 MG TABS: 50 | 5 days supply | Qty: 5 | Fill #0

## 2017-03-04 MED FILL — ONDANSETRON HCL 4 MG TABLET: 4 | 6 days supply | Qty: 20 | Fill #0

## 2017-03-04 NOTE — Patient Instructions (Signed)
Sinusitis, Adult Sinusitis is soreness and inflammation of your sinuses. Sinuses are hollow spaces in the bones around your face. They are located:  Around your eyes.  In the middle of your forehead.  Behind your nose.  In your cheekbones.  Your sinuses and nasal passages are lined with a stringy fluid (mucus). Mucus normally drains out of your sinuses. When your nasal tissues get inflamed or swollen, the mucus can get trapped or blocked so air cannot flow through your sinuses. This lets bacteria, viruses, and funguses grow, and that leads to infection. Follow these instructions at home: Medicines  Take, use, or apply over-the-counter and prescription medicines only as told by your doctor. These may include nasal sprays.  If you were prescribed an antibiotic medicine, take it as told by your doctor. Do not stop taking the antibiotic even if you start to feel better. Hydrate and Humidify  Drink enough water to keep your pee (urine) clear or pale yellow.  Use a cool mist humidifier to keep the humidity level in your home above 50%.  Breathe in steam for 10-15 minutes, 3-4 times a day or as told by your doctor. You can do this in the bathroom while a hot shower is running.  Try not to spend time in cool or dry air. Rest  Rest as much as possible.  Sleep with your head raised (elevated).  Make sure to get enough sleep each night. General instructions  Put a warm, moist washcloth on your face 3-4 times a day or as told by your doctor. This will help with discomfort.  Wash your hands often with soap and water. If there is no soap and water, use hand sanitizer.  Do not smoke. Avoid being around people who are smoking (secondhand smoke).  Keep all follow-up visits as told by your doctor. This is important. Contact a doctor if:  You have a fever.  Your symptoms get worse.  Your symptoms do not get better within 10 days. Get help right away if:  You have a very bad  headache.  You cannot stop throwing up (vomiting).  You have pain or swelling around your face or eyes.  You have trouble seeing.  You feel confused.  Your neck is stiff.  You have trouble breathing. This information is not intended to replace advice given to you by your health care provider. Make sure you discuss any questions you have with your health care provider. Document Released: 09/03/2007 Document Revised: 11/11/2015 Document Reviewed: 01/10/2015 Elsevier Interactive Patient Education  [employer].

## 2017-03-04 NOTE — Progress Notes (Addendum)
Subjective:    Patient ID: Jennifer Erickson, female    DOB: 23-Feb-1971, 46 y.o.   MRN: [id number]  HPI Patient is a 46 y/o female who presents today for diarrhea, vomiting, and sinus congestion. She reports vomiting and diarrhea began 3 days ago and initially thought she had food poisoning due to the sudden onset. She states that she has 3 episodes of diarrhea daily, and had one episode of vomiting today. She denies abdominal pain, abdominal cramps, dysuria, bloody stools, or mucus in her stools.  She also reports sudden onset of sinus congestion, sinus pain and pressure, headache, rhinorrhea, and post nasal drip that began 3 days ago. She also reports ear pain and teeth pain from the sinus pressure. She denies fever, chills, cough, wheezing, or chest tightness. She has tried taking sudaphed and Tylenol without relief and Mucinex which she reports as providing some relief. She denies sick contacts.  .. Active Ambulatory Problems    Diagnosis Date Noted  . Obesity, Class III, BMI 40-49.9 (morbid obesity) ([place]) 08/19/2011  . Uvular swelling 12/30/2011  . Insomnia 12/30/2011  . Dermatitis 05/26/2012  . Plantar fasciitis, bilateral 11/01/2012  . Left breast mass 11/22/2012  . Obstructive sleep apnea 11/22/2012  . Bilateral swelling of feet 05/02/2013  . Left lumbar radiculitis 01/25/2014  . Vitamin D deficiency 04/19/2014  . Flank pain 11/01/2014  . Primary osteoarthritis of both knees 12/08/2014  . Large breasts 05/28/2015  . OSA on CPAP 07/10/2015  . Palpitations 07/11/2015  . 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 Cramps of left lower extremity 11/28/2015  . Polyarthralgia 02/18/2016  . Toe fracture, right 04/15/2016  . Lateral epicondylitis of both elbows 12/03/2016  . Bilateral shoulder pain 12/03/2016  . Grief reaction 12/26/2016   Resolved Ambulatory Problems    Diagnosis Date Noted  . Headache(784.0) 08/19/2011  . Uvular swelling 08/19/2011  . Right-sided low back pain  without sciatica 05/07/2015  . Lateral epicondylitis (tennis elbow) 05/28/2015   Past Medical History:  Diagnosis Date  . Personal history of amenorrhea   . Sinusitis       Review of Systems  Constitutional: Negative for chills and fever.  HENT: Positive for congestion, ear pain, postnasal drip, rhinorrhea, sinus pressure, sinus pain and sneezing. Negative for hearing loss, sore throat and tinnitus.   Respiratory: Negative for cough, chest tightness, shortness of breath and wheezing.   Gastrointestinal: Positive for diarrhea, nausea and vomiting. Negative for abdominal pain and blood in stool.  Genitourinary: Negative for dysuria.  Musculoskeletal: Negative for myalgias.  Neurological: Positive for headaches.      Objective:   Physical Exam  Constitutional: She is oriented to person, place, and time. She appears well-developed and well-nourished.  HENT:  Head: Normocephalic and atraumatic.  Nose: Mucosal edema and rhinorrhea present. No epistaxis. Right sinus exhibits maxillary sinus tenderness and frontal sinus tenderness. Left sinus exhibits maxillary sinus tenderness and frontal sinus tenderness.  Mouth/Throat: Uvula is midline and mucous membranes are normal. Posterior oropharyngeal erythema present. No oropharyngeal exudate or posterior oropharyngeal edema.  Left ear canal and TM erythematous, no drainage. Right ear occluded with cerumen  Cardiovascular: Normal rate, regular rhythm and normal heart sounds.  Pulmonary/Chest: Effort normal and breath sounds normal. She has no wheezes. She has no rales.  Lymphadenopathy:       Head (left side): Submandibular adenopathy present.    She has cervical adenopathy.       Left cervical: Superficial cervical adenopathy present.  Neurological: She is alert  and oriented to person, place, and time.  Skin: Skin is warm and dry.  Psychiatric: She has a normal mood and affect. Her behavior is normal. Thought content normal.  Vitals  reviewed.     Assessment & Plan:  .Marland KitchenMarland KitchenTeasia was seen today for sinus pressure, nausea, emesis and diarrhea.  Diagnoses and all orders for this visit:  Gastroenteritis -     promethazine (PHENERGAN) injection 12.5 mg  Diarrhea of presumed infectious origin -     Stool culture  Acute viral sinusitis -     predniSONE (DELTASONE) 50 MG tablet; Take one tablet for 5 days. -     methylPREDNISolone sodium succinate (SOLU-MEDROL) 125 mg/2 mL injection 125 mg  Non-intractable vomiting with nausea, unspecified vomiting type -     ondansetron (ZOFRAN) 4 MG tablet; Take 1 tablet (4 mg total) by mouth every 8 (eight) hours as needed for nausea or vomiting. -     promethazine (PHENERGAN) injection 12.5 mg   Gastroenteritis - Patient is has had acute onset of diarrhea for 3 days. Due to the recent increase in food recalls due to contamination and that she is still having episodes of diarrhea, I would like to get a stool culture to rule out food contamination as the cause of her symptoms. Administered IM injection of phenergan today for acute relief of her nausea and prescribed Zofran PRN for nausea and vomiting. Advised patient that she should follow the BRAT diet and eat bland foods such as bananas, toast and rice. Advised patient to stay hydrated by increasing her fluid intake. Discussed with patient that if her symptoms worsen or she develops severe abdominal pain or cramping to follow-up for further evaluation.  Acute viral sinusitis - Due to the short duration of the patients symptoms, I suspect a viral cause of her sinusitis and her sinus pain and pressure is most likely due to an acute inflammatory response. Administered Solu-medrol IM for acute relief of her inflammation and prescribed prednisone burst for 5 days. Advised patient to continue OTC Mucinex, pseudoephedrine, and Tylenol for symptomatic relief. Discussed with patient that if she does not experience improvement in her symptoms by Friday  to notify the office for further evaluation or potential antibiotic prescription.

## 2017-03-05 ENCOUNTER — Encounter: Payer: Self-pay | Admitting: Physician Assistant

## 2017-03-05 DIAGNOSIS — R197 Diarrhea, unspecified: Secondary | ICD-10-CM | POA: Diagnosis not present

## 2017-03-06 ENCOUNTER — Encounter: Payer: Self-pay | Admitting: *Deleted

## 2017-03-09 LAB — STOOL CULTURE
MICRO NUMBER: 81376431
MICRO NUMBER: 81376432
MICRO NUMBER:: 81376433
SHIGA RESULT:: NOT DETECTED
SPECIMEN QUALITY: ADEQUATE
SPECIMEN QUALITY:: ADEQUATE
SPECIMEN QUALITY:: ADEQUATE

## 2017-03-10 ENCOUNTER — Encounter: Payer: Self-pay | Admitting: *Deleted

## 2017-04-07 ENCOUNTER — Ambulatory Visit (INDEPENDENT_AMBULATORY_CARE_PROVIDER_SITE_OTHER): Payer: Federal, State, Local not specified - PPO

## 2017-04-07 ENCOUNTER — Encounter: Payer: Self-pay | Admitting: Physician Assistant

## 2017-04-07 ENCOUNTER — Ambulatory Visit (INDEPENDENT_AMBULATORY_CARE_PROVIDER_SITE_OTHER): Payer: Federal, State, Local not specified - PPO | Admitting: Physician Assistant

## 2017-04-07 ENCOUNTER — Encounter: Payer: Self-pay | Admitting: *Deleted

## 2017-04-07 VITALS — BP 142/87 | HR 85 | Ht 66.0 in | Wt 268.0 lb

## 2017-04-07 DIAGNOSIS — R1031 Right lower quadrant pain: Secondary | ICD-10-CM

## 2017-04-07 DIAGNOSIS — M545 Low back pain: Secondary | ICD-10-CM

## 2017-04-07 DIAGNOSIS — N200 Calculus of kidney: Secondary | ICD-10-CM

## 2017-04-07 DIAGNOSIS — R109 Unspecified abdominal pain: Secondary | ICD-10-CM | POA: Diagnosis not present

## 2017-04-07 DIAGNOSIS — J069 Acute upper respiratory infection, unspecified: Secondary | ICD-10-CM

## 2017-04-07 DIAGNOSIS — N3000 Acute cystitis without hematuria: Secondary | ICD-10-CM

## 2017-04-07 LAB — CBC WITH DIFFERENTIAL/PLATELET
Basophils Absolute: 28 cells/uL (ref 0–200)
Basophils Relative: 0.4 %
EOS ABS: 168 {cells}/uL (ref 15–500)
Eosinophils Relative: 2.4 %
HCT: 39.4 % (ref 35.0–45.0)
Hemoglobin: 13.5 g/dL (ref 11.7–15.5)
Lymphs Abs: 2688 cells/uL (ref 850–3900)
MCH: 29.8 pg (ref 27.0–33.0)
MCHC: 34.3 g/dL (ref 32.0–36.0)
MCV: 87 fL (ref 80.0–100.0)
MPV: 10.4 fL (ref 7.5–12.5)
Monocytes Relative: 9.4 %
NEUTROS PCT: 49.4 %
Neutro Abs: 3458 cells/uL (ref 1500–7800)
PLATELETS: 286 10*3/uL (ref 140–400)
RBC: 4.53 10*6/uL (ref 3.80–5.10)
RDW: 12.9 % (ref 11.0–15.0)
TOTAL LYMPHOCYTE: 38.4 %
WBC: 7 10*3/uL (ref 3.8–10.8)
WBCMIX: 658 {cells}/uL (ref 200–950)

## 2017-04-07 LAB — COMPLETE METABOLIC PANEL WITH GFR
AG RATIO: 0.9 (calc) — AB (ref 1.0–2.5)
ALBUMIN MSPROF: 3.7 g/dL (ref 3.6–5.1)
ALT: 19 U/L (ref 6–29)
AST: 17 U/L (ref 10–35)
Alkaline phosphatase (APISO): 97 U/L (ref 33–115)
BILIRUBIN TOTAL: 0.3 mg/dL (ref 0.2–1.2)
BUN: 12 mg/dL (ref 7–25)
CHLORIDE: 103 mmol/L (ref 98–110)
CO2: 24 mmol/L (ref 20–32)
Calcium: 9.2 mg/dL (ref 8.6–10.2)
Creat: 0.76 mg/dL (ref 0.50–1.10)
GFR, EST AFRICAN AMERICAN: 109 mL/min/{1.73_m2} (ref >=60)
GFR, Est Non African American: 94 mL/min/{1.73_m2} (ref >=60)
GLOBULIN: 3.9 g/dL — AB (ref 1.9–3.7)
Glucose, Bld: 83 mg/dL (ref 65–99)
POTASSIUM: 4.3 mmol/L (ref 3.5–5.3)
SODIUM: 137 mmol/L (ref 135–146)
TOTAL PROTEIN: 7.6 g/dL (ref 6.1–8.1)

## 2017-04-07 LAB — POCT URINALYSIS DIPSTICK
BILIRUBIN UA: NEGATIVE
GLUCOSE UA: NEGATIVE
Ketones, UA: NEGATIVE
Nitrite, UA: POSITIVE
PH UA: 6 (ref 5.0–8.0)
Protein, UA: NEGATIVE
RBC UA: NEGATIVE
SPEC GRAV UA: 1.025 (ref 1.010–1.025)
UROBILINOGEN UA: 0.2 U/dL

## 2017-04-07 MED ORDER — IOPAMIDOL (ISOVUE-300) INJECTION 61%
100.0000 mL | Freq: Once | INTRAVENOUS | Status: AC | PRN
  Administered 2017-04-07: 100 mL via INTRAVENOUS

## 2017-04-07 MED ORDER — SULFAMETHOXAZOLE-TRIMETHOPRIM 800-160 MG PO TABS
1.0000 | ORAL_TABLET | Freq: Two times a day (BID) | ORAL | 0 refills | Status: DC
Start: 2017-04-07 — End: 2017-06-08

## 2017-04-07 MED FILL — EPINEPHRINE 0.3 MG AUTO-INJ: 0.3 | 2 days supply | Qty: 2 | Fill #1

## 2017-04-07 MED FILL — SULFAMETHOXAZOLE/TMP DS TAB: 800-160 | 10 days supply | Qty: 20 | Fill #0

## 2017-04-07 NOTE — Progress Notes (Signed)
Culture please.

## 2017-04-07 NOTE — Progress Notes (Signed)
Subjective:    Patient ID: Jennifer Erickson, female    DOB: October 12, 1970, 47 y.o.   MRN: [id number]  HPI Pt is a morbidly obese 47 yo female who presents to the clinic with right sided abdominal pain that has been present for the last 3 days. She denies any vomiting, fever, chills. She denies any injuries, new work outs or heavy lifting. She rates the pain 8/10 with walking and flexion of the right leg. She has not done anything to make better. She has been urinating more frequently but no pain while urinating.   She also has a cough, sinus pressure, nasal congestion, ST. Not tried anything. No fever, chills, body aches.   .. Active Ambulatory Problems    Diagnosis Date Noted  . Obesity, Class III, BMI 40-49.9 (morbid obesity) ([place]) 08/19/2011  . Uvular swelling 12/30/2011  . Insomnia 12/30/2011  . Dermatitis 05/26/2012  . Plantar fasciitis, bilateral 11/01/2012  . Left breast mass 11/22/2012  . Obstructive sleep apnea 11/22/2012  . Bilateral swelling of feet 05/02/2013  . Left lumbar radiculitis 01/25/2014  . Vitamin D deficiency 04/19/2014  . Flank pain 11/01/2014  . Primary osteoarthritis of both knees 12/08/2014  . Large breasts 05/28/2015  . OSA on CPAP 07/10/2015  . Palpitations 07/11/2015  . 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 Cramps of left lower extremity 11/28/2015  . Polyarthralgia 02/18/2016  . Toe fracture, right 04/15/2016  . Lateral epicondylitis of both elbows 12/03/2016  . Bilateral shoulder pain 12/03/2016  . Grief reaction 12/26/2016   Resolved Ambulatory Problems    Diagnosis Date Noted  . Headache(784.0) 08/19/2011  . Uvular swelling 08/19/2011  . Right-sided low back pain without sciatica 05/07/2015  . Lateral epicondylitis (tennis elbow) 05/28/2015   Past Medical History:  Diagnosis Date  . Personal history of amenorrhea   . Sinusitis      Review of Systems  All other systems reviewed and are negative.      Objective:   Physical Exam   Constitutional: She is oriented to person, place, and time. She appears well-developed and well-nourished.  Cardiovascular: Normal rate, regular rhythm and normal heart sounds.  Pulmonary/Chest: Effort normal and breath sounds normal. She has no wheezes.  Right sided CVA tenderness.   Abdominal: Soft. Bowel sounds are normal. She exhibits no distension. There is tenderness.  Moderate to severe tenderness to palpation over right lower quadrant with some rebound.  Positive psoas sign.  Pain with resisted flexion of right leg.   Neurological: She is alert and oriented to person, place, and time.  Psychiatric: She has a normal mood and affect. Her behavior is normal.          Assessment & Plan:   Marland KitchenMarland KitchenDiagnoses and all orders for this visit:  Right sided abdominal pain -     POCT urinalysis dipstick -     CBC with Differential/Platelet -     COMPLETE METABOLIC PANEL WITH GFR  Acute right-sided low back pain, with sciatica presence unspecified -     POCT urinalysis dipstick  Right lower quadrant abdominal pain -     CT Abdomen Pelvis W Contrast -     Urine Culture  Acute cystitis without hematuria -     sulfamethoxazole-trimethoprim (BACTRIM DS,SEPTRA DS) 800-160 MG tablet; Take 1 tablet by mouth 2 (two) times daily. For 10 days. -     Urine Culture  Viral URI    .[name] Results for orders placed or performed in visit on 04/07/17  CBC  with Differential/Platelet  Result Value Ref Range   WBC 7.0 3.8 - 10.8 Thousand/uL   RBC 4.53 3.80 - 5.10 Million/uL   Hemoglobin 13.5 11.7 - 15.5 g/dL   HCT 39.4 35.0 - 45.0 %   MCV 87.0 80.0 - 100.0 fL   MCH 29.8 27.0 - 33.0 pg   MCHC 34.3 32.0 - 36.0 g/dL   RDW 12.9 11.0 - 15.0 %   Platelets 286 140 - 400 Thousand/uL   MPV 10.4 7.5 - 12.5 fL   Neutro Abs 3,458 1,500 - 7,800 cells/uL   Lymphs Abs 2,688 850 - 3,900 cells/uL   WBC mixed population 658 200 - 950 cells/uL   Eosinophils Absolute 168 15 - 500 cells/uL   Basophils Absolute 28  0 - 200 cells/uL   Neutrophils Relative % 49.4 %   Total Lymphocyte 38.4 %   Monocytes Relative 9.4 %   Eosinophils Relative 2.4 %   Basophils Relative 0.4 %  COMPLETE METABOLIC PANEL WITH GFR  Result Value Ref Range   Glucose, Bld 83 65 - 99 mg/dL   BUN 12 7 - 25 mg/dL   Creat 0.76 0.50 - 1.10 mg/dL   GFR, Est Non African American 94 > OR = 60 mL/min/1.62m   GFR, Est African American 109 > OR = 60 mL/min/1.718m  BUN/Creatinine Ratio NOT APPLICABLE 6 - 22 (calc)   Sodium 137 135 - 146 mmol/L   Potassium 4.3 3.5 - 5.3 mmol/L   Chloride 103 98 - 110 mmol/L   CO2 24 20 - 32 mmol/L   Calcium 9.2 8.6 - 10.2 mg/dL   Total Protein 7.6 6.1 - 8.1 g/dL   Albumin 3.7 3.6 - 5.1 g/dL   Globulin 3.9 (H) 1.9 - 3.7 g/dL (calc)   AG Ratio 0.9 (L) 1.0 - 2.5 (calc)   Total Bilirubin 0.3 0.2 - 1.2 mg/dL   Alkaline phosphatase (APISO) 97 33 - 115 U/L   AST 17 10 - 35 U/L   ALT 19 6 - 29 U/L  POCT urinalysis dipstick  Result Value Ref Range   Color, UA yellow    Clarity, UA slightly cloudy    Glucose, UA neg    Bilirubin, UA neg    Ketones, UA neg    Spec Grav, UA 1.025 1.010 - 1.025   Blood, UA neg    pH, UA 6.0 5.0 - 8.0   Protein, UA neg    Urobilinogen, UA 0.2 0.2 or 1.0 E.U./dL   Nitrite, UA pos    Leukocytes, UA Moderate (2+) (A) Negative   Appearance     Odor     She does appear to have UTI. Started bactrim due to cipro allergies and concoment URI symptoms.  Will culture.   Pain seems out of proportion to UTI or pulled muscle. Pt also has no reason to have pulled a muscle. Due to PE abdominal pain I got STAT CT to rule out appendicitis or abscess. CT was negative. Continue to treat UTI. Consider tylenol, rest, hydration, and heating pad.   WBC reassuring.   Follow up if new symptoms or pain worsens.

## 2017-04-07 NOTE — Progress Notes (Signed)
Normal CBC

## 2017-04-07 NOTE — Patient Instructions (Signed)

## 2017-04-07 NOTE — Progress Notes (Signed)
Call pt: appendix is normal. No masses. No bowel obstruction. No abdominal cause of pain. You do have an non-obstructing kidney stone in left kidney. That should not be causing pain.  Pain could be UTI. Start bactrim. Perhaps a pulled muscle. Heat/rest/tylenol.  Do you want a few vicodin for intense pain? I can send electronically.

## 2017-04-08 ENCOUNTER — Encounter: Payer: Self-pay | Admitting: Physician Assistant

## 2017-04-10 LAB — URINE CULTURE
MICRO NUMBER:: 90030071
SPECIMEN QUALITY:: ADEQUATE

## 2017-04-14 ENCOUNTER — Encounter: Payer: Self-pay | Admitting: Physician Assistant

## 2017-04-14 ENCOUNTER — Ambulatory Visit (INDEPENDENT_AMBULATORY_CARE_PROVIDER_SITE_OTHER): Payer: Federal, State, Local not specified - PPO | Admitting: Physician Assistant

## 2017-04-14 VITALS — BP 140/88 | HR 102 | Temp 98.8°F | Ht 66.0 in | Wt 272.0 lb

## 2017-04-14 DIAGNOSIS — J320 Chronic maxillary sinusitis: Secondary | ICD-10-CM

## 2017-04-14 MED ORDER — METHYLPREDNISOLONE SODIUM SUCC 125 MG IJ SOLR
125.0000 mg | Freq: Once | INTRAMUSCULAR | Status: AC
  Administered 2017-04-14: 125 mg via INTRAMUSCULAR

## 2017-04-14 MED ORDER — METHYLPREDNISOLONE 4 MG PO TBPK: ORAL_TABLET | ORAL | 0 refills | Status: DC

## 2017-04-14 MED FILL — METHYLPREDNISOLONE 4 MG TAB: 4 | 6 days supply | Qty: 21 | Fill #0

## 2017-04-14 NOTE — Patient Instructions (Addendum)
flonase 2 sprays each nostril.  Start medrol dose pack tomorrow.    Sinusitis, Adult Sinusitis is soreness and inflammation of your sinuses. Sinuses are hollow spaces in the bones around your face. Your sinuses are located:  Around your eyes.  In the middle of your forehead.  Behind your nose.  In your cheekbones.  Your sinuses and nasal passages are lined with a stringy fluid (mucus). Mucus normally drains out of your sinuses. When your nasal tissues become inflamed or swollen, the mucus can become trapped or blocked so air cannot flow through your sinuses. This allows bacteria, viruses, and funguses to grow, which leads to infection. Sinusitis can develop quickly and last for 7?10 days (acute) or for more than 12 weeks (chronic). Sinusitis often develops after a cold. What are the causes? This condition is caused by anything that creates swelling in the sinuses or stops mucus from draining, including:  Allergies.  Asthma.  Bacterial or viral infection.  Abnormally shaped bones between the nasal passages.  Nasal growths that contain mucus (nasal polyps).  Narrow sinus openings.  Pollutants, such as chemicals or irritants in the air.  A foreign object stuck in the nose.  A fungal infection. This is rare.  What increases the risk? The following factors may make you more likely to develop this condition:  Having allergies or asthma.  Having had a recent cold or respiratory tract infection.  Having structural deformities or blockages in your nose or sinuses.  Having a weak immune system.  Doing a lot of swimming or diving.  Overusing nasal sprays.  Smoking.  What are the signs or symptoms? The main symptoms of this condition are pain and a feeling of pressure around the affected sinuses. Other symptoms include:  Upper toothache.  Earache.  Headache.  Bad breath.  Decreased sense of smell and taste.  A cough that may get worse at  night.  Fatigue.  Fever.  Thick drainage from your nose. The drainage is often green and it may contain pus (purulent).  Stuffy nose or congestion.  Postnasal drip. This is when extra mucus collects in the throat or back of the nose.  Swelling and warmth over the affected sinuses.  Sore throat.  Sensitivity to light.  How is this diagnosed? This condition is diagnosed based on symptoms, a medical history, and a physical exam. To find out if your condition is acute or chronic, your health care provider may:  Look in your nose for signs of nasal polyps.  Tap over the affected sinus to check for signs of infection.  View the inside of your sinuses using an imaging device that has a light attached (endoscope).  If your health care provider suspects that you have chronic sinusitis, you may also:  Be tested for allergies.  Have a sample of mucus taken from your nose (nasal culture) and checked for bacteria.  Have a mucus sample examined to see if your sinusitis is related to an allergy.  If your sinusitis does not respond to treatment and it lasts longer than 8 weeks, you may have an MRI or CT scan to check your sinuses. These scans also help to determine how severe your infection is. In rare cases, a bone biopsy may be done to rule out more serious types of fungal sinus disease. How is this treated? Treatment for sinusitis depends on the cause and whether your condition is chronic or acute. If a virus is causing your sinusitis, your symptoms will go away on  their own within 10 days. You may be given medicines to relieve your symptoms, including:  Topical nasal decongestants. They shrink swollen nasal passages and let mucus drain from your sinuses.  Antihistamines. These drugs block inflammation that is triggered by allergies. This can help to ease swelling in your nose and sinuses.  Topical nasal corticosteroids. These are nasal sprays that ease inflammation and swelling in  your nose and sinuses.  Nasal saline washes. These rinses can help to get rid of thick mucus in your nose.  If your condition is caused by bacteria, you will be given an antibiotic medicine. If your condition is caused by a fungus, you will be given an antifungal medicine. Surgery may be needed to correct underlying conditions, such as narrow nasal passages. Surgery may also be needed to remove polyps. Follow these instructions at home: Medicines  Take, use, or apply over-the-counter and prescription medicines only as told by your health care provider. These may include nasal sprays.  If you were prescribed an antibiotic medicine, take it as told by your health care provider. Do not stop taking the antibiotic even if you start to feel better. Hydrate and Humidify  Drink enough water to keep your urine clear or pale yellow. Staying hydrated will help to thin your mucus.  Use a cool mist humidifier to keep the humidity level in your home above 50%.  Inhale steam for 10-15 minutes, 3-4 times a day or as told by your health care provider. You can do this in the bathroom while a hot shower is running.  Limit your exposure to cool or dry air. Rest  Rest as much as possible.  Sleep with your head raised (elevated).  Make sure to get enough sleep each night. General instructions  Apply a warm, moist washcloth to your face 3-4 times a day or as told by your health care provider. This will help with discomfort.  Wash your hands often with soap and water to reduce your exposure to viruses and other germs. If soap and water are not available, use hand sanitizer.  Do not smoke. Avoid being around people who are smoking (secondhand smoke).  Keep all follow-up visits as told by your health care provider. This is important. Contact a health care provider if:  You have a fever.  Your symptoms get worse.  Your symptoms do not improve within 10 days. Get help right away if:  You have a  severe headache.  You have persistent vomiting.  You have pain or swelling around your face or eyes.  You have vision problems.  You develop confusion.  Your neck is stiff.  You have trouble breathing. This information is not intended to replace advice given to you by your health care provider. Make sure you discuss any questions you have with your health care provider. Document Released: 03/17/2005 Document Revised: 11/11/2015 Document Reviewed: 01/10/2015 Elsevier Interactive Patient Education  [employer].

## 2017-04-14 NOTE — Progress Notes (Signed)
   Subjective:    Patient ID: Jennifer Erickson, female    DOB: [birth year], 47 y.o.   MRN: [id number]  HPI  Pt is a 47 yo female who presents to the clinic with left sided sinus pressure, left ear pain/popping for last 3 days. Denies any fever, chills, body aches. She is on bactrim for UTI. She is not taking anything OTC. No cough, SOB, wheezing. Rates pain on left side 6/10 "she feels like her left side is going to cave in".   .. Active Ambulatory Problems    Diagnosis Date Noted  . Obesity, Class III, BMI 40-49.9 (morbid obesity) ([place]) 08/19/2011  . Uvular swelling 12/30/2011  . Insomnia 12/30/2011  . Dermatitis 05/26/2012  . Plantar fasciitis, bilateral 11/01/2012  . Left breast mass 11/22/2012  . Obstructive sleep apnea 11/22/2012  . Bilateral swelling of feet 05/02/2013  . Left lumbar radiculitis 01/25/2014  . Vitamin D deficiency 04/19/2014  . Flank pain 11/01/2014  . Primary osteoarthritis of both knees 12/08/2014  . Large breasts 05/28/2015  . OSA on CPAP 07/10/2015  . Palpitations 07/11/2015  . 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 Cramps of left lower extremity 11/28/2015  . Polyarthralgia 02/18/2016  . Toe fracture, right 04/15/2016  . Lateral epicondylitis of both elbows 12/03/2016  . Bilateral shoulder pain 12/03/2016  . Grief reaction 12/26/2016   Resolved Ambulatory Problems    Diagnosis Date Noted  . Headache(784.0) 08/19/2011  . Uvular swelling 08/19/2011  . Right-sided low back pain without sciatica 05/07/2015  . Lateral epicondylitis (tennis elbow) 05/28/2015   Past Medical History:  Diagnosis Date  . Personal history of amenorrhea   . Sinusitis       Review of Systems  All other systems reviewed and are negative.      Objective:   Physical Exam  Constitutional: She is oriented to person, place, and time. She appears well-developed and well-nourished.  HENT:  Head: Normocephalic and atraumatic.  Right Ear: External ear normal.  Left Ear:  External ear normal.  Nose: Nose normal.  Mouth/Throat: Oropharynx is clear and moist. No oropharyngeal exudate.  Tenderness to palpation over left maxillary sinus only.   Eyes: Conjunctivae are normal. Right eye exhibits no discharge. Left eye exhibits no discharge.  Neck: Normal range of motion. Neck supple.  Cardiovascular: Normal rate, regular rhythm and normal heart sounds.  Pulmonary/Chest: Effort normal and breath sounds normal.  Lymphadenopathy:    She has no cervical adenopathy.  Neurological: She is alert and oriented to person, place, and time.  Psychiatric: She has a normal mood and affect. Her behavior is normal.          Assessment & Plan:  Marland KitchenMarland KitchenNatosha was seen today for headache, sinus pressure and nasal congestion.  Diagnoses and all orders for this visit:  Left maxillary sinusitis -     methylPREDNISolone (MEDROL DOSEPAK) 4 MG TBPK tablet; Take as directed by package insert. -     methylPREDNISolone sodium succinate (SOLU-MEDROL) 125 mg/2 mL injection 125 mg   Pt should continue bactrim as it should treat for sinusitis as well as UTI. Consider flonase daily. Solumedrol given with medrol dose pak to start tomorrow. Follow up as needed.

## 2017-04-15 ENCOUNTER — Encounter: Payer: Self-pay | Admitting: Physician Assistant

## 2017-05-23 ENCOUNTER — Emergency Department (INDEPENDENT_AMBULATORY_CARE_PROVIDER_SITE_OTHER)
Admission: EM | Admit: 2017-05-23 | Discharge: 2017-05-23 | Disposition: A | Discharge status: Home / Self Care | Payer: Federal, State, Local not specified - PPO | Source: Home / Self Care

## 2017-05-23 ENCOUNTER — Encounter: Payer: Self-pay | Admitting: Emergency Medicine

## 2017-05-23 DIAGNOSIS — M778 Other enthesopathies, not elsewhere classified: Secondary | ICD-10-CM | POA: Diagnosis not present

## 2017-05-23 MED ORDER — MELOXICAM 7.5 MG PO TABS: 7.5000 mg | ORAL_TABLET | Freq: Every day | ORAL | 0 refills | Status: DC

## 2017-05-23 NOTE — ED Triage Notes (Signed)
Patient presents to [facility] with C/O pain in the left elbow times 3 weeks, she states it has been worse in the last week. Denies injury. Rates pain 8/10

## 2017-05-25 NOTE — ED Provider Notes (Signed)
[name]    CSN: [id number] Arrival date & time: 05/23/17  1426     History   Chief Complaint Chief Complaint  Patient presents with  . Elbow Pain    HPI Jennifer Erickson is a 47 y.o. female.   The history is provided by the patient. No language interpreter was used.  Arm Injury  Location:  Elbow Injury: no   Pain details:    Quality:  Aching   Radiates to:  Does not radiate   Severity:  Moderate   Onset quality:  Gradual   Duration:  3 weeks   Timing:  Constant   Progression:  Worsening Relieved by:  Nothing Worsened by:  Nothing Risk factors: no concern for non-accidental trauma   Pt has a history of elbow pain.  Pt reports she works at [employer].  She has used a tennis elbow brace without relief   Past Medical History:  Diagnosis Date  . Personal history of amenorrhea   . Sinusitis     Patient Active Problem List   Diagnosis Date Noted  . Grief reaction 12/26/2016  . Lateral epicondylitis of both elbows 12/03/2016  . Bilateral shoulder pain 12/03/2016  . Toe fracture, right 04/15/2016  . Polyarthralgia 02/18/2016  . Cramps of left lower extremity 11/28/2015  . 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 Palpitations 07/11/2015  . OSA on CPAP 07/10/2015  . Large breasts 05/28/2015  . Primary osteoarthritis of both knees 12/08/2014  . Flank pain 11/01/2014  . Vitamin D deficiency 04/19/2014  . Left lumbar radiculitis 01/25/2014  . Bilateral swelling of feet 05/02/2013  . Left breast mass 11/22/2012  . Obstructive sleep apnea 11/22/2012  . Plantar fasciitis, bilateral 11/01/2012  . Dermatitis 05/26/2012  . Uvular swelling 12/30/2011  . Insomnia 12/30/2011  . Obesity, Class III, BMI 40-49.9 (morbid obesity) ([place]) 08/19/2011    Past Surgical History:  Procedure Laterality Date  . DILATION AND CURETTAGE OF UTERUS    . TONSILLECTOMY      OB History    No data available       Home Medications    Prior to Admission medications     Medication Sig Start Date End Date Taking? Authorizing Provider  EPINEPHrine (EPIPEN 2-PAK) 0.3 mg/0.3 mL IJ SOAJ injection INJECT 0.3 MLS (0.3 MG TOTAL) INTO THE MUSCLE ONCE. 10/28/16   Trixie Dredge, PA-C  etonogestrel (IMPLANON) 68 MG IMPL implant Inject 1 each into the skin once.    [provider]  meloxicam (MOBIC) 7.5 MG tablet Take 1 tablet (7.5 mg total) by mouth daily. 05/23/17   Fransico Meadow, PA-C  methylPREDNISolone (MEDROL DOSEPAK) 4 MG TBPK tablet Take as directed by package insert. 04/14/17   Breeback, Jade L, PA-C  sulfamethoxazole-trimethoprim (BACTRIM DS,SEPTRA DS) 800-160 MG tablet Take 1 tablet by mouth 2 (two) times daily. For 10 days. 04/07/17   Donella Stade, PA-C    Family History Family History  Problem Relation Age of Onset  . Cancer Mother   . Hypertension Father   . Diabetes Brother     Social History Social History   Tobacco Use  . Smoking status: Never Smoker  . Smokeless tobacco: Never Used  Substance Use Topics  . Alcohol use: No  . Drug use: No     Allergies   Aspirin; Augmentin [amoxicillin-pot clavulanate]; Ciprofloxacin; Hydrocodone; and Ivp dye [iodinated diagnostic agents]   Review of Systems Review of Systems  All other systems reviewed and are negative.  Physical Exam Triage Vital Signs ED Triage Vitals  Enc Vitals Group     BP 05/23/17 1517 (!) 135/94     Pulse Rate 05/23/17 1517 92     Resp 05/23/17 1517 16     Temp 05/23/17 1517 98.2 F (36.8 C)     Temp Source 05/23/17 1517 Oral     SpO2 05/23/17 1517 99 %     Weight 05/23/17 1518 270 lb (122.5 kg)     Height 05/23/17 1518 5' 6"  (1.676 m)     Head Circumference --      Peak Flow --      Pain Score 05/23/17 1517 8     Pain Loc --      Pain Edu? --      Excl. in [place]? --    No data found.  Updated Vital Signs BP (!) 135/94 (BP Location: Right Arm)   Pulse 92   Temp 98.2 F (36.8 C) (Oral)   Resp 16   Ht 5' 6"  (1.676 m)   Wt 270 lb  (122.5 kg)   SpO2 99%   BMI 43.58 kg/m   Visual Acuity Right Eye Distance:   Left Eye Distance:   Bilateral Distance:    Right Eye Near:   Left Eye Near:    Bilateral Near:     Physical Exam  Constitutional: She appears well-developed and well-nourished. No distress.  HENT:  Head: Normocephalic and atraumatic.  Eyes: Conjunctivae are normal.  Pulmonary/Chest: Effort normal.  Musculoskeletal: Normal range of motion. She exhibits no edema.  Pain elbow to palpation, pain with range of motion,  nv and ns intact    Neurological: She is alert.  Skin: Skin is warm and dry.  Psychiatric: She has a normal mood and affect.  Nursing note and vitals reviewed.    UC Treatments / Results  Labs (all labs ordered are listed, but only abnormal results are displayed) Labs Reviewed - No data to display  EKG  EKG Interpretation None       Radiology No results found.  Procedures Procedures (including critical care time)  Medications Ordered in UC Medications - No data to display   Initial Impression / Assessment and Plan / UC Course  I have reviewed the triage vital signs and the nursing notes.  Pertinent labs & imaging results that were available during my care of the patient were reviewed by me and considered in my medical decision making (see chart for details).     MDM  I suspect repetition injury.  Pt advised follow up with sports medicine  Sling to help with discomfort Meloxicam  Final Clinical Impressions(s) / UC Diagnoses   Final diagnoses:  Left elbow tendonitis    ED Discharge Orders        Ordered    meloxicam (MOBIC) 7.5 MG tablet  Daily     05/23/17 1555     An After Visit Summary was printed and given to the patient.   Controlled Substance Prescriptions [place] Controlled Substance Registry consulted? Not Applicable   Fransico Meadow, [place] 05/25/17 1500

## 2017-06-08 ENCOUNTER — Encounter: Payer: Self-pay | Admitting: Sports Medicine

## 2017-06-08 ENCOUNTER — Ambulatory Visit (INDEPENDENT_AMBULATORY_CARE_PROVIDER_SITE_OTHER): Payer: Federal, State, Local not specified - PPO | Admitting: Sports Medicine

## 2017-06-08 DIAGNOSIS — M7712 Lateral epicondylitis, left elbow: Secondary | ICD-10-CM

## 2017-06-08 DIAGNOSIS — M7711 Lateral epicondylitis, right elbow: Secondary | ICD-10-CM

## 2017-06-08 NOTE — Progress Notes (Signed)
Subjective:    CC: Left elbow pain  HPI: Left elbow pain: This is a pleasant 47 year old female, she for the past several weeks had increasing pain in her left elbow, lateral aspect, she last had an injection here about 6 months ago, has been wearing the tennis elbow brace, has not been that diligent with rehab exercises, pain is severe, persistent, localized with radiation to the dorsal mid forearm.  Preventive measures: Up-to-date, she did have a mammogram last year.  I reviewed the past medical history, family history, social history, surgical history, and allergies today and no changes were needed.  Please see the problem list section below in epic for further details.  Past Medical History: Past Medical History:  Diagnosis Date  . Personal history of amenorrhea   . Sinusitis    Past Surgical History: Past Surgical History:  Procedure Laterality Date  . DILATION AND CURETTAGE OF UTERUS    . TONSILLECTOMY     Social History: Social History   Socioeconomic History  . Marital status: Widowed    Spouse name: Not on file  . Number of children: Not on file  . Years of education: Not on file  . Highest education level: Not on file  Social Needs  . Financial resource strain: Not on file  . Food insecurity - worry: Not on file  . Food insecurity - inability: Not on file  . Transportation needs - medical: Not on file  . Transportation needs - non-medical: Not on file  Occupational History  . Not on file  Tobacco Use  . Smoking status: Never Smoker  . Smokeless tobacco: Never Used  Substance and Sexual Activity  . Alcohol use: No  . Drug use: No  . Sexual activity: Yes    Birth control/protection: Pill  Other Topics Concern  . Not on file  Social History Narrative  . Not on file   Family History: Family History  Problem Relation Age of Onset  . Cancer Mother   . Hypertension Father   . Diabetes Brother    Allergies: Allergies  Allergen Reactions  . Aspirin  Other (See Comments)    GI issues  . Augmentin [Amoxicillin-Pot Clavulanate] Diarrhea  . Ciprofloxacin     Nausea and vomiting.   [name] Hydrocodone     Cough syrup/nausea  . Ivp Dye [Iodinated Diagnostic Agents]    Medications: See med rec.  Review of Systems: No fevers, chills, night sweats, weight loss, chest pain, or shortness of breath.   Objective:    General: Well Developed, well nourished, and in no acute distress.  Neuro: Alert and oriented x3, extra-ocular muscles intact, sensation grossly intact.  HEENT: Normocephalic, atraumatic, pupils equal round reactive to light, neck supple, no masses, no lymphadenopathy, thyroid nonpalpable.  Skin: Warm and dry, no rashes. Cardiac: Regular rate and rhythm, no murmurs rubs or gallops, no lower extremity edema.  Respiratory: Clear to auscultation bilaterally. Not using accessory muscles, speaking in full sentences. Left elbow: Unremarkable to inspection. Range of motion full pronation, supination, flexion, extension. Strength is full to all of the above directions Stable to varus, valgus stress. Negative moving valgus stress test. Tender to palpation of the common extensor tendon origin Ulnar nerve does not sublux. Negative cubital tunnel Tinel's.  Procedure: Real-time Ultrasound Guided Injection of left common extensor tendon origin Device: GE Logiq E  Verbal informed consent obtained.  Time-out conducted.  Noted no overlying erythema, induration, or other signs of local infection.  Skin prepped in a sterile  fashion.  Local anesthesia: Topical Ethyl chloride.  With sterile technique and under real time ultrasound guidance: Noted a hypoechoic gap at the superficial origin of the common extensor tendon, using a 25-gauge needle I injected medication both superficial to and deep to the common extensor tendon for a total of 1 cc kenalog 40, 1 cc lidocaine, 1 cc bupivacaine. Completed without difficulty  Pain immediately resolved  suggesting accurate placement of the medication.  Advised to call if fevers/chills, erythema, induration, drainage, or persistent bleeding.  Images permanently stored and available for review in the ultrasound unit.  Impression: Technically successful ultrasound guided injection.  Impression and Recommendations:    Lateral epicondylitis of both elbows Previous injection was 6 months ago, repeat injection, at this time on the left. Return as needed.  ___________________________________________ Gwen Her. Dianah Field, M.D., ABFM., CAQSM. Primary Care and [place] Instructor of [place] [facility]

## 2017-06-08 NOTE — Assessment & Plan Note (Signed)
Previous injection was 6 months ago, repeat injection, at this time on the left. Return as needed.

## 2017-06-10 ENCOUNTER — Telehealth: Payer: Self-pay

## 2017-06-10 NOTE — Telephone Encounter (Signed)
Pt left VM asking for another day off. Pt states she was unable to move her arm. Please assist.

## 2017-06-11 ENCOUNTER — Encounter: Payer: Self-pay | Admitting: Sports Medicine

## 2017-06-11 NOTE — Telephone Encounter (Signed)
Letter in box.

## 2017-06-16 ENCOUNTER — Ambulatory Visit (INDEPENDENT_AMBULATORY_CARE_PROVIDER_SITE_OTHER): Payer: Federal, State, Local not specified - PPO | Admitting: Sports Medicine

## 2017-06-16 ENCOUNTER — Encounter: Payer: Self-pay | Admitting: Sports Medicine

## 2017-06-16 DIAGNOSIS — M7712 Lateral epicondylitis, left elbow: Secondary | ICD-10-CM

## 2017-06-16 DIAGNOSIS — M7711 Lateral epicondylitis, right elbow: Secondary | ICD-10-CM

## 2017-06-16 NOTE — Progress Notes (Signed)
Subjective:    CC: Right elbow pain   HPI: This is a pleasant 47 year old female, she came in with bilateral tennis elbow, we did her left side about a week ago and she is doing well, having persistence of pain in the right, moderate, persistent, with no radiation.  Localized, desires repeat interventional treatment today on the side.  I reviewed the past medical history, family history, social history, surgical history, and allergies today and no changes were needed.  Please see the problem list section below in epic for further details.  Past Medical History: Past Medical History:  Diagnosis Date  . Personal history of amenorrhea   . Sinusitis    Past Surgical History: Past Surgical History:  Procedure Laterality Date  . DILATION AND CURETTAGE OF UTERUS    . TONSILLECTOMY     Social History: Social History   Socioeconomic History  . Marital status: Widowed    Spouse name: None  . Number of children: None  . Years of education: None  . Highest education level: None  Social Needs  . Financial resource strain: None  . Food insecurity - worry: None  . Food insecurity - inability: None  . Transportation needs - medical: None  . Transportation needs - non-medical: None  Occupational History  . None  Tobacco Use  . Smoking status: Never Smoker  . Smokeless tobacco: Never Used  Substance and Sexual Activity  . Alcohol use: No  . Drug use: No  . Sexual activity: Yes    Birth control/protection: Pill  Other Topics Concern  . None  Social History Narrative  . None   Family History: Family History  Problem Relation Age of Onset  . Cancer Mother   . Hypertension Father   . Diabetes Brother    Allergies: Allergies  Allergen Reactions  . Aspirin Other (See Comments)    GI issues  . Augmentin [Amoxicillin-Pot Clavulanate] Diarrhea  . Ciprofloxacin     Nausea and vomiting.   [name] Hydrocodone     Cough syrup/nausea  . Ivp Dye [Iodinated Diagnostic Agents]     Medications: See med rec.  Review of Systems: No fevers, chills, night sweats, weight loss, chest pain, or shortness of breath.   Objective:    General: Well Developed, well nourished, and in no acute distress.  Neuro: Alert and oriented x3, extra-ocular muscles intact, sensation grossly intact.  HEENT: Normocephalic, atraumatic, pupils equal round reactive to light, neck supple, no masses, no lymphadenopathy, thyroid nonpalpable.  Skin: Warm and dry, no rashes. Cardiac: Regular rate and rhythm, no murmurs rubs or gallops, no lower extremity edema.  Respiratory: Clear to auscultation bilaterally. Not using accessory muscles, speaking in full sentences. Right elbow: Unremarkable to inspection. Range of motion full pronation, supination, flexion, extension. Strength is full to all of the above directions Stable to varus, valgus stress. Negative moving valgus stress test. Tender to palpation of the common extensor tendon origin Ulnar nerve does not sublux. Negative cubital tunnel Tinel's.  Procedure: Real-time Ultrasound Guided Injection of right common extensor tendon origin Device: GE Logiq E  Verbal informed consent obtained.  Time-out conducted.  Noted no overlying erythema, induration, or other signs of local infection.  Skin prepped in a sterile fashion.  Local anesthesia: Topical Ethyl chloride.  With sterile technique and under real time ultrasound guidance: Using a 25-gauge needle I placed medication both superficial to and deep to the common extensor tendon at the lateral epicondyle, a total of 1 cc kenalog 40, 1  cc lidocaine, 1 cc bupivacaine was used. Completed without difficulty  Pain immediately resolved suggesting accurate placement of the medication.  Advised to call if fevers/chills, erythema, induration, drainage, or persistent bleeding.  Images permanently stored and available for review in the ultrasound unit.  Impression: Technically successful ultrasound guided  injection.  Impression and Recommendations:    Lateral epicondylitis of both elbows Left-sided tennis elbow injection gave good relief about a week ago, desiring right-sided today, injection performed. I think the next injection should be a PRP injection. ___________________________________________ Gwen Her. Dianah Field, M.D., ABFM., CAQSM. Primary Care and [place] Instructor of [place] [facility]

## 2017-06-16 NOTE — Assessment & Plan Note (Signed)
Left-sided tennis elbow injection gave good relief about a week ago, desiring right-sided today, injection performed. I think the next injection should be a PRP injection.

## 2017-06-22 ENCOUNTER — Encounter: Payer: Self-pay | Admitting: Emergency Medicine

## 2017-06-22 ENCOUNTER — Emergency Department
Admission: EM | Admit: 2017-06-22 | Discharge: 2017-06-22 | Disposition: A | Discharge status: Home / Self Care | Payer: Federal, State, Local not specified - PPO | Source: Home / Self Care | Attending: Emergency Medicine | Admitting: Emergency Medicine

## 2017-06-22 ENCOUNTER — Emergency Department (INDEPENDENT_AMBULATORY_CARE_PROVIDER_SITE_OTHER): Payer: Federal, State, Local not specified - PPO

## 2017-06-22 DIAGNOSIS — R51 Headache: Secondary | ICD-10-CM

## 2017-06-22 DIAGNOSIS — R0982 Postnasal drip: Secondary | ICD-10-CM

## 2017-06-22 DIAGNOSIS — R519 Headache, unspecified: Secondary | ICD-10-CM

## 2017-06-22 MED ORDER — FLUTICASONE PROPIONATE 50 MCG/ACT NA SUSP: 2.0000 | Freq: Every day | NASAL | 2 refills | Status: DC

## 2017-06-22 MED ORDER — AZELASTINE HCL 0.1 % NA SOLN: 1.0000 | Freq: Two times a day (BID) | NASAL | 3 refills | Status: DC

## 2017-06-22 MED FILL — FLUTICASONE PROP 50 MCG SPR: 50 | 30 days supply | Qty: 16 | Fill #0

## 2017-06-22 MED FILL — AZELASTINE HCL 137 MCG/SPRA: 137 | 50 days supply | Qty: 30 | Fill #0

## 2017-06-22 NOTE — ED Provider Notes (Signed)
[name]    CSN: [id number] Arrival date & time: 06/22/17  1143     History   Chief Complaint Chief Complaint  Patient presents with  . Headache  Patient presents with a 3-day history of a throbbing frontal headache.  She has been taking Zyrtec and occasional Tylenol.  She has significant congestion and postnasal drip.  She has not had colored nasal drainage.  She has facial discomfort.  She has no cough.  HPI Jennifer Erickson is a 47 y.o. female.   HPI  Past Medical History:  Diagnosis Date  . Personal history of amenorrhea   . Sinusitis     Patient Active Problem List   Diagnosis Date Noted  . Grief reaction 12/26/2016  . Lateral epicondylitis of both elbows 12/03/2016  . Bilateral shoulder pain 12/03/2016  . Toe fracture, right 04/15/2016  . Polyarthralgia 02/18/2016  . Cramps of left lower extremity 11/28/2015  . 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 Palpitations 07/11/2015  . OSA on CPAP 07/10/2015  . Large breasts 05/28/2015  . Primary osteoarthritis of both knees 12/08/2014  . Flank pain 11/01/2014  . Vitamin D deficiency 04/19/2014  . Left lumbar radiculitis 01/25/2014  . Bilateral swelling of feet 05/02/2013  . Left breast mass 11/22/2012  . Obstructive sleep apnea 11/22/2012  . Plantar fasciitis, bilateral 11/01/2012  . Dermatitis 05/26/2012  . Uvular swelling 12/30/2011  . Insomnia 12/30/2011  . Obesity, Class III, BMI 40-49.9 (morbid obesity) ([place]) 08/19/2011    Past Surgical History:  Procedure Laterality Date  . DILATION AND CURETTAGE OF UTERUS    . TONSILLECTOMY      OB History   None      Home Medications    Prior to Admission medications   Medication Sig Start Date End Date Taking? Authorizing Provider  azelastine (ASTELIN) 0.1 % nasal spray Place 1 spray into both nostrils 2 (two) times daily. Use in each nostril as directed 06/22/17   Darlyne Russian, MD  EPINEPHrine (EPIPEN 2-PAK) 0.3 mg/0.3 mL IJ SOAJ injection  INJECT 0.3 MLS (0.3 MG TOTAL) INTO THE MUSCLE ONCE. 10/28/16   Trixie Dredge, PA-C  etonogestrel (IMPLANON) 68 MG IMPL implant Inject 1 each into the skin once.    [provider]  fluticasone (FLONASE) 50 MCG/ACT nasal spray Place 2 sprays into both nostrils daily. 06/22/17   Darlyne Russian, MD  meloxicam (MOBIC) 7.5 MG tablet Take 1 tablet (7.5 mg total) by mouth daily. Patient not taking: Reported on 06/08/2017 05/23/17   Sidney Ace    Family History Family History  Problem Relation Age of Onset  . Cancer Mother   . Hypertension Father   . Diabetes Brother     Social History Social History   Tobacco Use  . Smoking status: Never Smoker  . Smokeless tobacco: Never Used  Substance Use Topics  . Alcohol use: No  . Drug use: No     Allergies   Aspirin; Augmentin [amoxicillin-pot clavulanate]; Ciprofloxacin; Hydrocodone; and Ivp dye [iodinated diagnostic agents]   Review of Systems Review of Systems  Constitutional: Negative.   HENT: Positive for congestion and postnasal drip. Negative for sore throat and trouble swallowing.   Eyes: Negative.   Respiratory: Negative.   Cardiovascular: Negative.      Physical Exam Triage Vital Signs ED Triage Vitals [06/22/17 1221]  Enc Vitals Group     BP 127/86     Pulse Rate 78     Resp  Temp 98.6 F (37 C)     Temp Source Oral     SpO2 100 %     Weight 237 lb (107.5 kg)     Height      Head Circumference      Peak Flow      Pain Score 0     Pain Loc      Pain Edu?      Excl. in [place]?    No data found.  Updated Vital Signs BP 127/86 (BP Location: Right Arm)   Pulse 78   Temp 98.6 F (37 C) (Oral)   Wt 237 lb (107.5 kg)   SpO2 100%   BMI 38.25 kg/m   Visual Acuity Right Eye Distance:   Left Eye Distance:   Bilateral Distance:    Right Eye Near:   Left Eye Near:    Bilateral Near:     Physical Exam  Constitutional:  Overweight female in no acute distress.  HENT:  Head:  Normocephalic and atraumatic.  Mouth/Throat: Oropharynx is clear and moist.  I did not see any postnasal drip.  Nasal turbinates are swollen.  No polyps were seen.  Eyes: Pupils are equal, round, and reactive to light. EOM are normal.  Neck: Normal range of motion. Neck supple.  Cardiovascular: Normal rate and regular rhythm.  Pulmonary/Chest: Effort normal and breath sounds normal.     UC Treatments / Results  Labs (all labs ordered are listed, but only abnormal results are displayed) Labs Reviewed - No data to display  EKG None Radiology Dg Sinuses Complete  Result Date: 06/22/2017 CLINICAL DATA:  Three days of frontal headache. EXAM: PARANASAL SINUSES - COMPLETE 3 + VIEW COMPARISON:  Sinus series of December 19, 2015 FINDINGS: The frontal sinuses are grossly clear. The ethmoid, maxillary, and sphenoid sinuses are clear. No air-fluid levels are observed. IMPRESSION: No findings to suggest acute or significant chronic paranasal sinus inflammation. Electronically Signed   By: David  Martinique M.D.   On: 06/22/2017 13:34    Procedures Procedures (including critical care time)  Medications Ordered in UC Medications - No data to display   Initial Impression / Assessment and Plan / UC Course  I have reviewed the triage vital signs and the nursing notes.  Pertinent labs & imaging results that were available during my care of the patient were reviewed by me and considered in my medical decision making (see chart for details). Physical exam and evaluation most consistent with sinalgia most likely related to allergies.  We will add Flonase and Astelin to the patient's Zyrtec.  Will try and avoid steroids.  There was no evidence of sinusitis on her films.      Final Clinical Impressions(s) / UC Diagnoses   Final diagnoses:  Sinus headache  Postnasal drip    ED Discharge Orders        Ordered    fluticasone (FLONASE) 50 MCG/ACT nasal spray  Daily     06/22/17 1315    azelastine  (ASTELIN) 0.1 % nasal spray  2 times daily     06/22/17 1315     Continue Zyrtec. Try Flonase and Astelin for relief. Please avoid being outside with allergy exposure.  Controlled Substance Prescriptions [place] Controlled Substance Registry consulted? Not Applicable   Darlyne Russian, MD 06/22/17 [phone]

## 2017-06-22 NOTE — Discharge Instructions (Addendum)
Continue Zyrtec. Try Flonase and Astelin for relief. Please avoid being outside with allergy exposure.

## 2017-06-22 NOTE — ED Triage Notes (Signed)
Pt c/o HA, post nasal drip and dizziness x3 days. States she has tried various OTC cold meds with no relief. Afebrile.

## 2017-06-26 ENCOUNTER — Other Ambulatory Visit: Payer: Self-pay

## 2017-06-26 ENCOUNTER — Emergency Department
Admission: EM | Admit: 2017-06-26 | Discharge: 2017-06-26 | Disposition: A | Discharge status: Home / Self Care | Payer: Federal, State, Local not specified - PPO | Source: Home / Self Care

## 2017-06-26 ENCOUNTER — Encounter: Payer: Self-pay | Admitting: *Deleted

## 2017-06-26 ENCOUNTER — Ambulatory Visit: Payer: Self-pay | Admitting: Physician Assistant

## 2017-06-26 DIAGNOSIS — J0120 Acute ethmoidal sinusitis, unspecified: Secondary | ICD-10-CM | POA: Diagnosis not present

## 2017-06-26 MED ORDER — ACETAMINOPHEN-CODEINE #3 300-30 MG PO TABS: 1.0000 | ORAL_TABLET | ORAL | 0 refills | Status: DC | PRN

## 2017-06-26 MED ORDER — CEFDINIR 300 MG PO CAPS: 300.0000 mg | ORAL_CAPSULE | Freq: Two times a day (BID) | ORAL | 0 refills | Status: DC

## 2017-06-26 NOTE — Discharge Instructions (Signed)
See your Physician for recheck next week

## 2017-06-26 NOTE — ED Triage Notes (Signed)
Patient c/o sinus pain and HA x 1 week. She was seen for HA 4 days ago. HA has not resolved, she now has a productive cough as well. Also c/o sweats.

## 2017-06-27 NOTE — ED Provider Notes (Signed)
[name]    CSN: [id number] Arrival date & time: 06/26/17  1920     History   Chief Complaint Chief Complaint  Patient presents with  . Headache  . Cough    HPI Jennifer Erickson is a 47 y.o. female.   The history is provided by the patient. No language interpreter was used.  Headache  Pain location:  Generalized Radiates to:  Does not radiate Onset quality:  Gradual Duration:  4 days Progression:  Worsening Chronicity:  Recurrent Similar to prior headaches: yes   Relieved by:  Nothing Ineffective treatments:  Acetaminophen and NSAIDs Associated symptoms: cough   Cough  Associated symptoms: headaches    Pt thinks she has a sinus infection.  Pt was seen here and started on nasal spray. Pt reports she is getting worse.  Past Medical History:  Diagnosis Date  . Personal history of amenorrhea   . Sinusitis     Patient Active Problem List   Diagnosis Date Noted  . Grief reaction 12/26/2016  . Lateral epicondylitis of both elbows 12/03/2016  . Bilateral shoulder pain 12/03/2016  . Toe fracture, right 04/15/2016  . Polyarthralgia 02/18/2016  . Cramps of left lower extremity 11/28/2015  . 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 Palpitations 07/11/2015  . OSA on CPAP 07/10/2015  . Large breasts 05/28/2015  . Primary osteoarthritis of both knees 12/08/2014  . Flank pain 11/01/2014  . Vitamin D deficiency 04/19/2014  . Left lumbar radiculitis 01/25/2014  . Bilateral swelling of feet 05/02/2013  . Left breast mass 11/22/2012  . Obstructive sleep apnea 11/22/2012  . Plantar fasciitis, bilateral 11/01/2012  . Dermatitis 05/26/2012  . Uvular swelling 12/30/2011  . Insomnia 12/30/2011  . Obesity, Class III, BMI 40-49.9 (morbid obesity) ([place]) 08/19/2011    Past Surgical History:  Procedure Laterality Date  . DILATION AND CURETTAGE OF UTERUS    . TONSILLECTOMY      OB History   None      Home Medications    Prior to Admission medications     Medication Sig Start Date End Date Taking? Authorizing Provider  acetaminophen-codeine (TYLENOL #3) 300-30 MG tablet Take 1 tablet by mouth every 4 (four) hours as needed for moderate pain. 06/26/17   Fransico Meadow, PA-C  azelastine (ASTELIN) 0.1 % nasal spray Place 1 spray into both nostrils 2 (two) times daily. Use in each nostril as directed 06/22/17   Darlyne Russian, MD  cefdinir (OMNICEF) 300 MG capsule Take 1 capsule (300 mg total) by mouth 2 (two) times daily. 06/26/17   Fransico Meadow, PA-C  EPINEPHrine (EPIPEN 2-PAK) 0.3 mg/0.3 mL IJ SOAJ injection INJECT 0.3 MLS (0.3 MG TOTAL) INTO THE MUSCLE ONCE. 10/28/16   Trixie Dredge, PA-C  etonogestrel (IMPLANON) 68 MG IMPL implant Inject 1 each into the skin once.    [provider]  fluticasone (FLONASE) 50 MCG/ACT nasal spray Place 2 sprays into both nostrils daily. 06/22/17   Darlyne Russian, MD  meloxicam (MOBIC) 7.5 MG tablet Take 1 tablet (7.5 mg total) by mouth daily. Patient not taking: Reported on 06/08/2017 05/23/17   Sidney Ace    Family History Family History  Problem Relation Age of Onset  . Cancer Mother   . Hypertension Father   . Diabetes Brother     Social History Social History   Tobacco Use  . Smoking status: Never Smoker  . Smokeless tobacco: Never Used  Substance Use Topics  . Alcohol use:  No  . Drug use: No     Allergies   Aspirin; Augmentin [amoxicillin-pot clavulanate]; Ciprofloxacin; Hydrocodone; and Ivp dye [iodinated diagnostic agents]   Review of Systems Review of Systems  Respiratory: Positive for cough.   Neurological: Positive for headaches.  All other systems reviewed and are negative.    Physical Exam Triage Vital Signs ED Triage Vitals [06/26/17 1937]  Enc Vitals Group     BP 120/84     Pulse Rate (!) 106     Resp 16     Temp 98.8 F (37.1 C)     Temp Source Oral     SpO2 99 %     Weight 267 lb (121.1 kg)     Height      Head Circumference       Peak Flow      Pain Score 2     Pain Loc      Pain Edu?      Excl. in [place]?    No data found.  Updated Vital Signs BP 120/84 (BP Location: Right Arm)   Pulse (!) 106   Temp 98.8 F (37.1 C) (Oral)   Resp 16   Wt 267 lb (121.1 kg)   SpO2 99%   BMI 43.09 kg/m   Visual Acuity Right Eye Distance:   Left Eye Distance:   Bilateral Distance:    Right Eye Near:   Left Eye Near:    Bilateral Near:     Physical Exam  Constitutional: She is oriented to person, place, and time. She appears well-developed and well-nourished. No distress.  HENT:  Head: Normocephalic and atraumatic.  Mouth/Throat: Posterior oropharyngeal erythema present.  Tender maxillary sinuses,  Eyes: Pupils are equal, round, and reactive to light. Conjunctivae and EOM are normal.  Neck: Normal range of motion. Neck supple.  Cardiovascular: Normal rate and regular rhythm.  No murmur heard. Pulmonary/Chest: Effort normal and breath sounds normal. No respiratory distress.  Abdominal: Soft. There is no tenderness.  Musculoskeletal: Normal range of motion. She exhibits no edema.  Neurological: She is alert and oriented to person, place, and time.  Skin: Skin is warm and dry.  Psychiatric: She has a normal mood and affect.  Nursing note and vitals reviewed.    UC Treatments / Results  Labs (all labs ordered are listed, but only abnormal results are displayed) Labs Reviewed - No data to display  EKG None Radiology No results found.  Procedures Procedures (including critical care time)  Medications Ordered in UC Medications - No data to display   Initial Impression / Assessment and Plan / UC Course  I have reviewed the triage vital signs and the nursing notes.  Pertinent labs & imaging results that were available during my care of the patient were reviewed by me and considered in my medical decision making (see chart for details).     Pt's family request tylenol #3 for pt's headache.  I will give  rx for 6.  Pt advised to contact primary if further pain mangement needed.   Final Clinical Impressions(s) / UC Diagnoses   Final diagnoses:  Acute ethmoidal sinusitis, recurrence not specified    ED Discharge Orders        Ordered    cefdinir (OMNICEF) 300 MG capsule  2 times daily     06/26/17 1951    acetaminophen-codeine (TYLENOL #3) 300-30 MG tablet  Every 4 hours PRN     06/26/17 1951       Controlled  Substance Prescriptions [place] Controlled Substance Registry consulted? Not Applicable   Fransico Meadow, [place] 06/27/17 [id number]

## 2017-07-03 ENCOUNTER — Encounter: Payer: Self-pay | Admitting: Sports Medicine

## 2017-07-03 ENCOUNTER — Ambulatory Visit (INDEPENDENT_AMBULATORY_CARE_PROVIDER_SITE_OTHER): Payer: Federal, State, Local not specified - PPO | Admitting: Sports Medicine

## 2017-07-03 ENCOUNTER — Ambulatory Visit (INDEPENDENT_AMBULATORY_CARE_PROVIDER_SITE_OTHER): Payer: Federal, State, Local not specified - PPO

## 2017-07-03 DIAGNOSIS — R05 Cough: Secondary | ICD-10-CM

## 2017-07-03 DIAGNOSIS — R059 Cough, unspecified: Secondary | ICD-10-CM

## 2017-07-03 DIAGNOSIS — R0989 Other specified symptoms and signs involving the circulatory and respiratory systems: Secondary | ICD-10-CM

## 2017-07-03 MED ORDER — PREDNISONE 50 MG PO TABS: 50.0000 mg | ORAL_TABLET | Freq: Every day | ORAL | 0 refills | Status: DC

## 2017-07-03 MED ORDER — BENZONATATE 200 MG PO CAPS: 200.0000 mg | ORAL_CAPSULE | Freq: Three times a day (TID) | ORAL | 0 refills | Status: DC | PRN

## 2017-07-03 MED ORDER — AZITHROMYCIN 250 MG PO TABS: ORAL_TABLET | ORAL | 0 refills | Status: DC

## 2017-07-03 MED FILL — AZITHROMYCIN 250 MG TABLET: 250 | 6 days supply | Qty: 6 | Fill #0

## 2017-07-03 MED FILL — BENZONATATE 200 MG CAP: 200 | 15 days supply | Qty: 45 | Fill #0

## 2017-07-03 MED FILL — predniSONE 50 MG TABS: 50 | 5 days supply | Qty: 5 | Fill #0

## 2017-07-03 NOTE — Progress Notes (Signed)
Subjective:    CC: Still feeling sick  HPI: This is a pleasant 47 year old female, for the past couple of weeks now she has had forehead pain and pressure, nasal discharge, cough, overall malaise.  No shortness of breath, fevers, chills, no trauma.  She has been to urgent care twice, initially treated symptomatically, subsequently she was given antibiotics in the form of a cephalosporin as well as some Tylenol with codeine.  Unfortunately she continues to have worsening symptoms, worsening facial pain and pressure, nasal discharge, malaise, cough is also worsening.  Continues to have no shortness of breath, no GI symptoms, no chest pain.  I reviewed the past medical history, family history, social history, surgical history, and allergies today and no changes were needed.  Please see the problem list section below in epic for further details.  Past Medical History: Past Medical History:  Diagnosis Date  . Personal history of amenorrhea   . Sinusitis    Past Surgical History: Past Surgical History:  Procedure Laterality Date  . DILATION AND CURETTAGE OF UTERUS    . TONSILLECTOMY     Social History: Social History   Socioeconomic History  . Marital status: Widowed    Spouse name: Not on file  . Number of children: Not on file  . Years of education: Not on file  . Highest education level: Not on file  Occupational History  . Not on file  Social Needs  . Financial resource strain: Not on file  . Food insecurity:    Worry: Not on file    Inability: Not on file  . Transportation needs:    Medical: Not on file    Non-medical: Not on file  Tobacco Use  . Smoking status: Never Smoker  . Smokeless tobacco: Never Used  Substance and Sexual Activity  . Alcohol use: No  . Drug use: No  . Sexual activity: Yes    Birth control/protection: Pill  Lifestyle  . Physical activity:    Days per week: Not on file    Minutes per session: Not on file  . Stress: Not on file  Relationships   . Social connections:    Talks on phone: Not on file    Gets together: Not on file    Attends religious service: Not on file    Active member of club or organization: Not on file    Attends meetings of clubs or organizations: Not on file    Relationship status: Not on file  Other Topics Concern  . Not on file  Social History Narrative  . Not on file   Family History: Family History  Problem Relation Age of Onset  . Cancer Mother   . Hypertension Father   . Diabetes Brother    Allergies: Allergies  Allergen Reactions  . Aspirin Other (See Comments)    GI issues  . Augmentin [Amoxicillin-Pot Clavulanate] Diarrhea  . Ciprofloxacin     Nausea and vomiting.   [name] Hydrocodone     Cough syrup/nausea  . Ivp Dye [Iodinated Diagnostic Agents]    Medications: See med rec.  Review of Systems: No fevers, chills, night sweats, weight loss, chest pain, or shortness of breath.   Objective:    General: Well Developed, well nourished, and in no acute distress.  Neuro: Alert and oriented x3, extra-ocular muscles intact, sensation grossly intact.  HEENT: Normocephalic, atraumatic, pupils equal round reactive to light, neck supple, no masses, no lymphadenopathy, thyroid nonpalpable.  Tender to palpation over the frontal sinuses,  oropharynx is erythematous, nasopharynx is boggy and erythematous turbinates.  Severe rhinorrhea in the exam room. Skin: Warm and dry, no rashes. Cardiac: Regular rate and rhythm, no murmurs rubs or gallops, no lower extremity edema.  Respiratory: Coarse sounds in the right base. Not using accessory muscles, speaking in full sentences.  Impression and Recommendations:    Coughing Acute frontal sinusitis as well as a possible pneumonia. Cramping in the hands. CBC, CMP, magnesium levels. Chest x-ray, coarse sounds in the right base. Azithromycin, prednisone, Flonase. Tessalon Perles for cough suppression, she does have an allergy to hydrocodone. Return to see me  in 2 weeks. ___________________________________________ Gwen Her. Dianah Field, M.D., ABFM., CAQSM. Primary Care and [place] Instructor of [place] [facility]

## 2017-07-03 NOTE — Assessment & Plan Note (Signed)
Acute frontal sinusitis as well as a possible pneumonia. Cramping in the hands. CBC, CMP, magnesium levels. Chest x-ray, coarse sounds in the right base. Azithromycin, prednisone, Flonase. Tessalon Perles for cough suppression, she does have an allergy to hydrocodone. Return to see me in 2 weeks.

## 2017-07-04 LAB — CBC
HCT: 42 % (ref 35.0–45.0)
Hemoglobin: 14.2 g/dL (ref 11.7–15.5)
MCH: 29.9 pg (ref 27.0–33.0)
MCHC: 33.8 g/dL (ref 32.0–36.0)
MCV: 88.4 fL (ref 80.0–100.0)
MPV: 10.1 fL (ref 7.5–12.5)
Platelets: 290 10*3/uL (ref 140–400)
RBC: 4.75 Million/uL (ref 3.80–5.10)
RDW: 12.7 % (ref 11.0–15.0)
WBC: 8.1 Thousand/uL (ref 3.8–10.8)

## 2017-07-04 LAB — COMPREHENSIVE METABOLIC PANEL
AG Ratio: 1 (calc) (ref 1.0–2.5)
AST: 14 U/L (ref 10–35)
Albumin: 4 g/dL (ref 3.6–5.1)
Alkaline phosphatase (APISO): 112 U/L (ref 33–115)
BUN: 13 mg/dL (ref 7–25)
Calcium: 9.4 mg/dL (ref 8.6–10.2)
Chloride: 106 mmol/L (ref 98–110)
Creat: 0.86 mg/dL (ref 0.50–1.10)
Potassium: 4 mmol/L (ref 3.5–5.3)
Total Bilirubin: 0.4 mg/dL (ref 0.2–1.2)

## 2017-07-04 LAB — COMPREHENSIVE METABOLIC PANEL WITH GFR
ALT: 26 U/L (ref 6–29)
CO2: 25 mmol/L (ref 20–32)
Globulin: 4 g/dL — ABNORMAL HIGH (ref 1.9–3.7)
Glucose, Bld: 99 mg/dL (ref 65–99)
Sodium: 139 mmol/L (ref 135–146)
Total Protein: 8 g/dL (ref 6.1–8.1)

## 2017-07-04 LAB — MAGNESIUM: Magnesium: 1.8 mg/dL (ref 1.5–2.5)

## 2017-07-10 ENCOUNTER — Encounter: Payer: Self-pay | Admitting: Physician Assistant

## 2017-07-10 ENCOUNTER — Ambulatory Visit: Payer: Self-pay | Admitting: Physician Assistant

## 2017-07-10 ENCOUNTER — Ambulatory Visit (INDEPENDENT_AMBULATORY_CARE_PROVIDER_SITE_OTHER): Payer: Federal, State, Local not specified - PPO | Admitting: Physician Assistant

## 2017-07-10 VITALS — BP 132/80 | HR 92 | Ht 66.0 in | Wt 267.0 lb

## 2017-07-10 DIAGNOSIS — R059 Cough, unspecified: Secondary | ICD-10-CM

## 2017-07-10 DIAGNOSIS — Z0189 Encounter for other specified special examinations: Secondary | ICD-10-CM

## 2017-07-10 DIAGNOSIS — J0120 Acute ethmoidal sinusitis, unspecified: Secondary | ICD-10-CM

## 2017-07-10 DIAGNOSIS — R05 Cough: Secondary | ICD-10-CM | POA: Diagnosis not present

## 2017-07-10 NOTE — Progress Notes (Signed)
   Subjective:    Patient ID: Jennifer Erickson, female    DOB: [birth year], 47 y.o.   MRN: [id number]  HPI Pt is a 47 year old female who presents to the clinic for a letter to go back to work.  She has had a recent illness of recurrent sinus infections and persistent cough that has kept her out of work for the last week and a half.  She returns to follow-up to make sure she is fully ready to go back.  She is feeling excellent today.  She is feeling the best she has felt in over a few weeks.  She is not currently taking any medications.  She has finished her antibiotic, prednisone, Tessalon Perles.  She denies any fever, chills, shortness of breath, wheezing.  She does have an occasional cough.  She does a lot of walking at her job but feels like she is ready to go back.  She has no concerns this morning.  .. Active Ambulatory Problems    Diagnosis Date Noted  . Obesity, Class III, BMI 40-49.9 (morbid obesity) ([place]) 08/19/2011  . Uvular swelling 12/30/2011  . Insomnia 12/30/2011  . Dermatitis 05/26/2012  . Plantar fasciitis, bilateral 11/01/2012  . Left breast mass 11/22/2012  . Obstructive sleep apnea 11/22/2012  . Bilateral swelling of feet 05/02/2013  . Left lumbar radiculitis 01/25/2014  . Vitamin D deficiency 04/19/2014  . Flank pain 11/01/2014  . Primary osteoarthritis of both knees 12/08/2014  . Large breasts 05/28/2015  . OSA on CPAP 07/10/2015  . Palpitations 07/11/2015  . 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 Cramps of left lower extremity 11/28/2015  . Polyarthralgia 02/18/2016  . Toe fracture, right 04/15/2016  . Lateral epicondylitis of both elbows 12/03/2016  . Bilateral shoulder pain 12/03/2016  . Grief reaction 12/26/2016  . Coughing 07/03/2017   Resolved Ambulatory Problems    Diagnosis Date Noted  . Headache(784.0) 08/19/2011  . Uvular swelling 08/19/2011  . Right-sided low back pain without sciatica 05/07/2015  . Lateral epicondylitis (tennis elbow) 05/28/2015    Past Medical History:  Diagnosis Date  . Personal history of amenorrhea   . Sinusitis       Review of Systems  All other systems reviewed and are negative.      Objective:   Physical Exam  Constitutional: She is oriented to person, place, and time. She appears well-developed and well-nourished.  Obese.   HENT:  Head: Normocephalic and atraumatic.  Cardiovascular: Normal rate, regular rhythm and normal heart sounds.  Pulmonary/Chest: Effort normal and breath sounds normal. She has no wheezes.  Neurological: She is alert and oriented to person, place, and time.  Skin: Skin is dry.  Psychiatric: She has a normal mood and affect. Her behavior is normal.          Assessment & Plan:  Marland KitchenMarland KitchenDiagnoses and all orders for this visit:  Acute non-recurrent ethmoidal sinusitis  Cough   Pt is doing great today. Letter to go back to work no restrictions written.

## 2017-07-16 ENCOUNTER — Ambulatory Visit (INDEPENDENT_AMBULATORY_CARE_PROVIDER_SITE_OTHER): Payer: Federal, State, Local not specified - PPO | Admitting: Sports Medicine

## 2017-07-16 ENCOUNTER — Encounter: Payer: Self-pay | Admitting: Sports Medicine

## 2017-07-16 DIAGNOSIS — R05 Cough: Secondary | ICD-10-CM | POA: Diagnosis not present

## 2017-07-16 DIAGNOSIS — R059 Cough, unspecified: Secondary | ICD-10-CM

## 2017-07-16 NOTE — Progress Notes (Signed)
Subjective:    CC: Follow-up  HPI: This is a pleasant 47 year old female, I treated her at the last visit for a severe maxillary sinusitis, frontal sinusitis and acute bronchitis with suspicion of pneumonia, chest x-ray was negative.  She responded extremely well to prednisone, azithromycin, returns today completely symptom-free, happy with results.  I reviewed the past medical history, family history, social history, surgical history, and allergies today and no changes were needed.  Please see the problem list section below in epic for further details.  Past Medical History: Past Medical History:  Diagnosis Date  . Personal history of amenorrhea   . Sinusitis    Past Surgical History: Past Surgical History:  Procedure Laterality Date  . DILATION AND CURETTAGE OF UTERUS    . TONSILLECTOMY     Social History: Social History   Socioeconomic History  . Marital status: Widowed    Spouse name: Not on file  . Number of children: Not on file  . Years of education: Not on file  . Highest education level: Not on file  Occupational History  . Not on file  Social Needs  . Financial resource strain: Not on file  . Food insecurity:    Worry: Not on file    Inability: Not on file  . Transportation needs:    Medical: Not on file    Non-medical: Not on file  Tobacco Use  . Smoking status: Never Smoker  . Smokeless tobacco: Never Used  Substance and Sexual Activity  . Alcohol use: No  . Drug use: No  . Sexual activity: Yes    Birth control/protection: Pill  Lifestyle  . Physical activity:    Days per week: Not on file    Minutes per session: Not on file  . Stress: Not on file  Relationships  . Social connections:    Talks on phone: Not on file    Gets together: Not on file    Attends religious service: Not on file    Active member of club or organization: Not on file    Attends meetings of clubs or organizations: Not on file    Relationship status: Not on file  Other  Topics Concern  . Not on file  Social History Narrative  . Not on file   Family History: Family History  Problem Relation Age of Onset  . Cancer Mother   . Hypertension Father   . Diabetes Brother    Allergies: Allergies  Allergen Reactions  . Aspirin Other (See Comments)    GI issues  . Augmentin [Amoxicillin-Pot Clavulanate] Diarrhea  . Ciprofloxacin     Nausea and vomiting.   [name] Hydrocodone     Cough syrup/nausea  . Ivp Dye [Iodinated Diagnostic Agents]    Medications: See med rec.  Review of Systems: No fevers, chills, night sweats, weight loss, chest pain, or shortness of breath.   Objective:    General: Well Developed, well nourished, and in no acute distress.  Neuro: Alert and oriented x3, extra-ocular muscles intact, sensation grossly intact.  HEENT: Normocephalic, atraumatic, pupils equal round reactive to light, neck supple, no masses, no lymphadenopathy, thyroid nonpalpable.  Skin: Warm and dry, no rashes. Cardiac: Regular rate and rhythm, no murmurs rubs or gallops, no lower extremity edema.  Respiratory: Clear to auscultation bilaterally. Not using accessory muscles, speaking in full sentences.  Impression and Recommendations:    Coughing Acute maxillary sinusitis with bronchitis, symptoms now completely resolved after azithromycin and prednisone, return to see me as  needed. ___________________________________________ Gwen Her. Dianah Field, M.D., ABFM., CAQSM. Primary Care and [place] Instructor of [place] of [facility]

## 2017-07-16 NOTE — Assessment & Plan Note (Addendum)
Acute maxillary sinusitis with bronchitis, symptoms now completely resolved after azithromycin and prednisone, return to see me as needed.

## 2017-07-23 ENCOUNTER — Ambulatory Visit (INDEPENDENT_AMBULATORY_CARE_PROVIDER_SITE_OTHER): Payer: Federal, State, Local not specified - PPO | Admitting: Sports Medicine

## 2017-07-23 ENCOUNTER — Encounter: Payer: Self-pay | Admitting: Sports Medicine

## 2017-07-23 DIAGNOSIS — R05 Cough: Secondary | ICD-10-CM

## 2017-07-23 DIAGNOSIS — R059 Cough, unspecified: Secondary | ICD-10-CM

## 2017-07-23 LAB — COMPREHENSIVE METABOLIC PANEL
AG Ratio: 1 (calc) (ref 1.0–2.5)
ALT: 22 U/L (ref 6–29)
Albumin: 3.8 g/dL (ref 3.6–5.1)
CO2: 26 mmol/L (ref 20–32)
Calcium: 9.3 mg/dL (ref 8.6–10.2)
Creat: 0.77 mg/dL (ref 0.50–1.10)
Globulin: 3.7 g/dL (calc) (ref 1.9–3.7)
Glucose, Bld: 103 mg/dL — ABNORMAL HIGH (ref 65–99)
Potassium: 4.1 mmol/L (ref 3.5–5.3)
Sodium: 139 mmol/L (ref 135–146)
Total Bilirubin: 0.4 mg/dL (ref 0.2–1.2)
Total Protein: 7.5 g/dL (ref 6.1–8.1)

## 2017-07-23 LAB — CBC
HCT: 40.7 % (ref 35.0–45.0)
Hemoglobin: 13.9 g/dL (ref 11.7–15.5)
MCH: 30.3 pg (ref 27.0–33.0)
MCHC: 34.2 g/dL (ref 32.0–36.0)
MCV: 88.9 fL (ref 80.0–100.0)
MPV: 10.2 fL (ref 7.5–12.5)
Platelets: 270 Thousand/uL (ref 140–400)
RBC: 4.58 10*6/uL (ref 3.80–5.10)
RDW: 12.8 % (ref 11.0–15.0)
WBC: 6.9 10*3/uL (ref 3.8–10.8)

## 2017-07-23 LAB — COMPREHENSIVE METABOLIC PANEL WITH GFR
AST: 16 U/L (ref 10–35)
Alkaline phosphatase (APISO): 100 U/L (ref 33–115)
BUN: 14 mg/dL (ref 7–25)
Chloride: 106 mmol/L (ref 98–110)

## 2017-07-23 LAB — TSH: TSH: 1.59 m[IU]/L

## 2017-07-23 MED ORDER — ALBUTEROL SULFATE HFA 108 (90 BASE) MCG/ACT IN AERS: 2.0000 | INHALATION_SPRAY | Freq: Four times a day (QID) | RESPIRATORY_TRACT | 11 refills | Status: DC | PRN

## 2017-07-23 NOTE — Assessment & Plan Note (Signed)
Improved considerably with a round of azithromycin and prednisone approximately a week and a half ago. Improved, unfortunately has developed severe fatigue at work, her activities have been out of proportion in terms of exertion compared to her usual. In addition it is coming up to the time of year when her husband passed away, there may be some anniversary syndrome here. Lastly with her recent dose of prednisone we are going to test for adrenal suppression. Out of work until Monday, she will call me if the fatigue does not improve in a week.

## 2017-07-23 NOTE — Progress Notes (Signed)
Subjective:    CC: Fatigue  HPI: This is a pleasant 47 year old female, I treated her for a severe maxillary sinusitis as well as acute bronchitis about 2 weeks ago, she had a complete resolution of her symptoms.  We used prednisone and azithromycin, unfortunately afterwards she had to work out of proportion to her [place] the [place], she is now feeling extremely fatigued, her cough however has improved and she feels well from a pulmonary standpoint, no fevers or chills, no GI symptoms, no melena, hematochezia.  I reviewed the past medical history, family history, social history, surgical history, and allergies today and no changes were needed.  Please see the problem list section below in epic for further details.  Past Medical History: Past Medical History:  Diagnosis Date  . Personal history of amenorrhea   . Sinusitis    Past Surgical History: Past Surgical History:  Procedure Laterality Date  . DILATION AND CURETTAGE OF UTERUS    . TONSILLECTOMY     Social History: Social History   Socioeconomic History  . Marital status: Widowed    Spouse name: Not on file  . Number of children: Not on file  . Years of education: Not on file  . Highest education level: Not on file  Occupational History  . Not on file  Social Needs  . Financial resource strain: Not on file  . Food insecurity:    Worry: Not on file    Inability: Not on file  . Transportation needs:    Medical: Not on file    Non-medical: Not on file  Tobacco Use  . Smoking status: Never Smoker  . Smokeless tobacco: Never Used  Substance and Sexual Activity  . Alcohol use: No  . Drug use: No  . Sexual activity: Yes    Birth control/protection: Pill  Lifestyle  . Physical activity:    Days per week: Not on file    Minutes per session: Not on file  . Stress: Not on file  Relationships  . Social connections:    Talks on phone: Not on file    Gets together: Not on file    Attends  religious service: Not on file    Active member of club or organization: Not on file    Attends meetings of clubs or organizations: Not on file    Relationship status: Not on file  Other Topics Concern  . Not on file  Social History Narrative  . Not on file   Family History: Family History  Problem Relation Age of Onset  . Cancer Mother   . Hypertension Father   . Diabetes Brother    Allergies: Allergies  Allergen Reactions  . Aspirin Other (See Comments)    GI issues  . Augmentin [Amoxicillin-Pot Clavulanate] Diarrhea  . Ciprofloxacin     Nausea and vomiting.   [name] Hydrocodone     Cough syrup/nausea  . Ivp Dye [Iodinated Diagnostic Agents]    Medications: See med rec.  Review of Systems: No fevers, chills, night sweats, weight loss, chest pain, or shortness of breath.   Objective:    General: Well Developed, well nourished, and in no acute distress.  Neuro: Alert and oriented x3, extra-ocular muscles intact, sensation grossly intact.  HEENT: Normocephalic, atraumatic, pupils equal round reactive to light, neck supple, no masses, no lymphadenopathy, thyroid nonpalpable.  Skin: Warm and dry, no rashes. Cardiac: Regular rate and rhythm, no murmurs rubs or gallops, no lower extremity edema.  Respiratory:  Clear to auscultation bilaterally. Not using accessory muscles, speaking in full sentences.  Impression and Recommendations:    Coughing Improved considerably with a round of azithromycin and prednisone approximately a week and a half ago. Improved, unfortunately has developed severe fatigue at work, her activities have been out of proportion in terms of exertion compared to her usual. In addition it is coming up to the time of year when her husband passed away, there may be some anniversary syndrome here. Lastly with her recent dose of prednisone we are going to test for adrenal suppression. Out of work until Monday, she will call me if the fatigue does not improve in a  week. ___________________________________________ Gwen Her. Dianah Field, M.D., ABFM., CAQSM. Primary Care and [place] Instructor of [place] of [facility]

## 2017-08-01 LAB — CORTISOL, FREE: Cortisol Free, Ser: 0.16 ug/dL

## 2017-08-11 DIAGNOSIS — Z1231 Encounter for screening mammogram for malignant neoplasm of breast: Secondary | ICD-10-CM | POA: Diagnosis not present

## 2017-08-12 DIAGNOSIS — K080 Exfoliation of teeth due to systemic causes: Secondary | ICD-10-CM | POA: Diagnosis not present

## 2017-09-24 ENCOUNTER — Encounter: Payer: Self-pay | Admitting: Physician Assistant

## 2017-09-24 ENCOUNTER — Ambulatory Visit (INDEPENDENT_AMBULATORY_CARE_PROVIDER_SITE_OTHER): Payer: Federal, State, Local not specified - PPO | Admitting: Physician Assistant

## 2017-09-24 VITALS — BP 139/90 | HR 92 | Temp 98.1°F | Wt 278.0 lb

## 2017-09-24 DIAGNOSIS — R030 Elevated blood-pressure reading, without diagnosis of hypertension: Secondary | ICD-10-CM | POA: Diagnosis not present

## 2017-09-24 DIAGNOSIS — B350 Tinea barbae and tinea capitis: Secondary | ICD-10-CM | POA: Diagnosis not present

## 2017-09-24 MED ORDER — TERBINAFINE HCL 250 MG PO TABS: 250.0000 mg | ORAL_TABLET | Freq: Every day | ORAL | 0 refills | Status: AC

## 2017-09-24 MED FILL — TERBINAFINE HCL 250 MG TAB: 250 | 42 days supply | Qty: 42 | Fill #0

## 2017-09-24 NOTE — Progress Notes (Signed)
HPI:                                                                Jennifer Erickson is a 47 y.o. female who presents to [facility] today for rash on scalp  For the last 3 weeks patient has noted pruritic lesions in 3 separate areas of her scalp associated with hair loss. She goes to a hair salon on a regular basis for braids. States she is concerned this is ringworm from her dog because he also has a rash. No additional rashes or constitutional symptoms.    Past Medical History:  Diagnosis Date  . Personal history of amenorrhea   . Sinusitis    Past Surgical History:  Procedure Laterality Date  . DILATION AND CURETTAGE OF UTERUS    . TONSILLECTOMY     Social History   Tobacco Use  . Smoking status: Never Smoker  . Smokeless tobacco: Never Used  Substance Use Topics  . Alcohol use: No   family history includes Cancer in her mother; Diabetes in her brother; Hypertension in her father.    ROS: negative except as noted in the HPI  Medications: Current Outpatient Medications  Medication Sig Dispense Refill  . EPINEPHrine (EPIPEN 2-PAK) 0.3 mg/0.3 mL IJ SOAJ injection INJECT 0.3 MLS (0.3 MG TOTAL) INTO THE MUSCLE ONCE. 1 Device 4  . etonogestrel (IMPLANON) 68 MG IMPL implant Inject 1 each into the skin once.    . terbinafine (LAMISIL) 250 MG tablet Take 1 tablet (250 mg total) by mouth daily. 42 tablet 0   No current facility-administered medications for this visit.    Allergies  Allergen Reactions  . Aspirin Other (See Comments)    GI issues  . Augmentin [Amoxicillin-Pot Clavulanate] Diarrhea  . Ciprofloxacin     Nausea and vomiting.   [name] Hydrocodone     Cough syrup/nausea  . Ivp Dye [Iodinated Diagnostic Agents]        Objective:  BP 139/90   Pulse 92   Temp 98.1 F (36.7 C) (Oral)   Wt 278 lb (126.1 kg)   BMI 44.87 kg/m  Gen:  alert, not ill-appearing, no distress, appropriate for age, obese  female HEENT: head normocephalic without obvious abnormality, conjunctiva and cornea clear, trachea midline Pulm: Normal work of breathing, normal phonation Neuro: alert and oriented x 3, no tremor MSK: extremities atraumatic, normal gait and station Skin: three discrete annual lesions of Anterior scalp  Right scalp  Posterior scalp    Assessment and Plan: 47 y.o. female with   Tinea capitis - Plan: terbinafine (LAMISIL) 250 MG tablet  Elevated blood pressure reading - differential includes fungal infection, psoriasis, alopecia areata, traction alopecia - treating empirically for tinea with PO Lamisil 250 mg QD x 6 weeks  Elevated blood pressure reading BP Readings from Last 3 Encounters:  09/24/17 139/90  07/23/17 138/86  07/16/17 117/79  - last 2 office readings in stage 1 hypertensive range. Counseled on therapeutic lifestyle changes. Patient to monitor BP's at home  Patient education and anticipatory guidance given Patient agrees with treatment plan Follow-up as needed if symptoms worsen or fail to improve  Darlyne Russian PA-C

## 2017-10-23 ENCOUNTER — Other Ambulatory Visit: Payer: Self-pay

## 2017-10-23 ENCOUNTER — Encounter: Payer: Self-pay | Admitting: Emergency Medicine

## 2017-10-23 ENCOUNTER — Emergency Department
Admission: EM | Admit: 2017-10-23 | Discharge: 2017-10-23 | Disposition: A | Discharge status: Home / Self Care | Payer: Federal, State, Local not specified - PPO | Source: Home / Self Care | Attending: Family Medicine | Admitting: Family Medicine

## 2017-10-23 DIAGNOSIS — R51 Headache: Secondary | ICD-10-CM | POA: Diagnosis not present

## 2017-10-23 DIAGNOSIS — R519 Headache, unspecified: Secondary | ICD-10-CM

## 2017-10-23 DIAGNOSIS — H9201 Otalgia, right ear: Secondary | ICD-10-CM

## 2017-10-23 MED ORDER — KETOROLAC TROMETHAMINE 60 MG/2ML IM SOLN
30.0000 mg | Freq: Once | INTRAMUSCULAR | Status: AC
  Administered 2017-10-23: 30 mg via INTRAMUSCULAR

## 2017-10-23 NOTE — ED Provider Notes (Signed)
[name]    CSN: [id number] Arrival date & time: 10/23/17  1646     History   Chief Complaint Chief Complaint  Patient presents with  . Headache  . Otalgia    both    HPI Jennifer Erickson is a 47 y.o. female.   HPI Jennifer Erickson is a 47 y.o. female presenting to UC with c/o mild generalized HA that started yesterday, gradually worsening today with associated Right ear pain.  Triage note states both ears but on exam, pt states only Right ear is painful. Denies tooth pain. Denies fever, chills, cough, congestion or sore throat. She has not take any pain medication today because she does not like taking medication unless she needs to. No hx of migraines. Denies nausea, dizziness, change in vision, weakness or numbness.    Past Medical History:  Diagnosis Date  . Personal history of amenorrhea   . Sinusitis     Patient Active Problem List   Diagnosis Date Noted  . Elevated blood pressure reading 09/24/2017  . Coughing 07/03/2017  . Grief reaction 12/26/2016  . Lateral epicondylitis of both elbows 12/03/2016  . Bilateral shoulder pain 12/03/2016  . Toe fracture, right 04/15/2016  . Polyarthralgia 02/18/2016  . Cramps of left lower extremity 11/28/2015  . 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 Palpitations 07/11/2015  . OSA on CPAP 07/10/2015  . Large breasts 05/28/2015  . Primary osteoarthritis of both knees 12/08/2014  . Flank pain 11/01/2014  . Vitamin D deficiency 04/19/2014  . Left lumbar radiculitis 01/25/2014  . Bilateral swelling of feet 05/02/2013  . Left breast mass 11/22/2012  . Obstructive sleep apnea 11/22/2012  . Plantar fasciitis, bilateral 11/01/2012  . Dermatitis 05/26/2012  . Uvular swelling 12/30/2011  . Insomnia 12/30/2011  . Obesity, Class III, BMI 40-49.9 (morbid obesity) ([place]) 08/19/2011    Past Surgical History:  Procedure Laterality Date  . DILATION AND CURETTAGE OF UTERUS    . TONSILLECTOMY      OB History     None      Home Medications    Prior to Admission medications   Medication Sig Start Date End Date Taking? Authorizing Provider  EPINEPHrine (EPIPEN 2-PAK) 0.3 mg/0.3 mL IJ SOAJ injection INJECT 0.3 MLS (0.3 MG TOTAL) INTO THE MUSCLE ONCE. 10/28/16   Trixie Dredge, PA-C  etonogestrel (IMPLANON) 68 MG IMPL implant Inject 1 each into the skin once.    [provider]  terbinafine (LAMISIL) 250 MG tablet Take 1 tablet (250 mg total) by mouth daily. 09/24/17 11/05/17  Trixie Dredge, PA-C    Family History Family History  Problem Relation Age of Onset  . Cancer Mother   . Hypertension Father   . Diabetes Brother     Social History Social History   Tobacco Use  . Smoking status: Never Smoker  . Smokeless tobacco: Never Used  Substance Use Topics  . Alcohol use: No  . Drug use: No     Allergies   Aspirin; Augmentin [amoxicillin-pot clavulanate]; Ciprofloxacin; Hydrocodone; and Ivp dye [iodinated diagnostic agents]   Review of Systems Review of Systems  Constitutional: Positive for appetite change. Negative for chills, fatigue and fever.  HENT: Positive for ear pain (Right). Negative for congestion.   Gastrointestinal: Negative for diarrhea, nausea and vomiting.  Musculoskeletal: Negative for neck pain and neck stiffness.  Skin: Negative for rash.  Neurological: Positive for headaches. Negative for dizziness and light-headedness.     Physical Exam Triage Vital Signs ED  Triage Vitals  Enc Vitals Group     BP 10/23/17 1701 129/75     Pulse Rate 10/23/17 1701 91     Resp 10/23/17 1701 16     Temp 10/23/17 1701 98 F (36.7 C)     Temp Source 10/23/17 1701 Oral     SpO2 10/23/17 1701 98 %     Weight 10/23/17 1702 275 lb (124.7 kg)     Height 10/23/17 1702 5' 6"  (1.676 m)     Head Circumference --      Peak Flow --      Pain Score 10/23/17 1702 3     Pain Loc --      Pain Edu? --      Excl. in [place]? --    No data found.  Updated  Vital Signs BP 129/75 (BP Location: Right Arm)   Pulse 91   Temp 98 F (36.7 C) (Oral)   Resp 16   Ht 5' 6"  (1.676 m)   Wt 275 lb (124.7 kg)   SpO2 98%   BMI 44.39 kg/m   Visual Acuity Right Eye Distance:   Left Eye Distance:   Bilateral Distance:    Right Eye Near:   Left Eye Near:    Bilateral Near:     Physical Exam  Constitutional: She is oriented to person, place, and time. She appears well-developed and well-nourished.  Non-toxic appearance. She does not appear ill. No distress.  HENT:  Head: Normocephalic and atraumatic.  Right Ear: Tympanic membrane normal.  Left Ear: Tympanic membrane normal.  Nose: Nose normal. Right sinus exhibits no maxillary sinus tenderness and no frontal sinus tenderness. Left sinus exhibits no maxillary sinus tenderness and no frontal sinus tenderness.  Mouth/Throat: Uvula is midline, oropharynx is clear and moist and mucous membranes are normal.  Eyes: EOM are normal.  Neck: Normal range of motion. Neck supple.  No nuchal rigidity or meningeal signs.  Cardiovascular: Normal rate and regular rhythm.  Pulmonary/Chest: Effort normal and breath sounds normal. No stridor. No respiratory distress. She has no wheezes.  Musculoskeletal: Normal range of motion.  Neurological: She is alert and oriented to person, place, and time. She has normal strength. She displays a negative Romberg sign. Coordination and gait normal. GCS eye subscore is 4. GCS verbal subscore is 5. GCS motor subscore is 6.  Skin: Skin is warm and dry.  Psychiatric: She has a normal mood and affect. Her behavior is normal.  Nursing note and vitals reviewed.    UC Treatments / Results  Labs (all labs ordered are listed, but only abnormal results are displayed) Labs Reviewed - No data to display  EKG None  Radiology No results found.  Procedures Procedures (including critical care time)  Medications Ordered in UC Medications  ketorolac (TORADOL) injection 30 mg (30 mg  Intramuscular Given 10/23/17 1744)    Initial Impression / Assessment and Plan / UC Course  I have reviewed the triage vital signs and the nursing notes.  Pertinent labs & imaging results that were available during my care of the patient were reviewed by me and considered in my medical decision making (see chart for details).     Normal neuro and HENT exam Doubt SAH or CVA No evidence of underlying bacterial infection Home care instructions provided below.   Final Clinical Impressions(s) / UC Diagnoses   Final diagnoses:  Generalized headache  Right ear pain     Discharge Instructions      Please be  sure to get plenty of rest, at least 8 hours of sleep at night and be sure to stay well hydrated, especially if you are outside in the heat. Even mild dehydration can contribute to bad headaches.  You may take 536m acetaminophen (Tylenol) every 4-6 hours as needed for pain. If not improving by Monday, please follow up with your family medicine provider. Please call 911 or go to the hospital if symptoms worsening- worsening pain, dizziness, change in vision, unable to keep down fluids, one sided weakness or numbness or other new concerning symptoms develop.     ED Prescriptions    None     Controlled Substance Prescriptions [place] Controlled Substance Registry consulted? Not Applicable   PTyrell Antonio07/26/19 1955

## 2017-10-23 NOTE — Discharge Instructions (Signed)
°  Please be sure to get plenty of rest, at least 8 hours of sleep at night and be sure to stay well hydrated, especially if you are outside in the heat. Even mild dehydration can contribute to bad headaches.  You may take 561m acetaminophen (Tylenol) every 4-6 hours as needed for pain. If not improving by Monday, please follow up with your family medicine provider. Please call 911 or go to the hospital if symptoms worsening- worsening pain, dizziness, change in vision, unable to keep down fluids, one sided weakness or numbness or other new concerning symptoms develop.

## 2017-10-23 NOTE — ED Triage Notes (Signed)
Patient reports onset of mild headache yesterday; today frontal area hurts as well as both ears; no OTCs today.

## 2017-10-24 ENCOUNTER — Encounter: Payer: Self-pay | Admitting: Emergency Medicine

## 2017-10-26 ENCOUNTER — Encounter: Payer: Self-pay | Admitting: Emergency Medicine

## 2017-10-26 ENCOUNTER — Other Ambulatory Visit: Payer: Self-pay

## 2017-10-26 ENCOUNTER — Emergency Department
Admission: EM | Admit: 2017-10-26 | Discharge: 2017-10-26 | Disposition: A | Discharge status: Home / Self Care | Payer: Federal, State, Local not specified - PPO | Source: Home / Self Care

## 2017-10-26 DIAGNOSIS — B353 Tinea pedis: Secondary | ICD-10-CM

## 2017-10-26 DIAGNOSIS — N390 Urinary tract infection, site not specified: Secondary | ICD-10-CM

## 2017-10-26 DIAGNOSIS — B350 Tinea barbae and tinea capitis: Secondary | ICD-10-CM

## 2017-10-26 LAB — POCT URINALYSIS DIP (MANUAL ENTRY)
BILIRUBIN UA: NEGATIVE
BILIRUBIN UA: NEGATIVE mg/dL
GLUCOSE UA: NEGATIVE mg/dL
NITRITE UA: POSITIVE — AB
Protein Ur, POC: NEGATIVE mg/dL
RBC UA: NEGATIVE
Spec Grav, UA: 1.02 (ref 1.010–1.025)
Urobilinogen, UA: 0.2 E.U./dL
pH, UA: 7 (ref 5.0–8.0)

## 2017-10-26 MED ORDER — FLUCONAZOLE 200 MG PO TABS: 200.0000 mg | ORAL_TABLET | Freq: Every day | ORAL | 0 refills | Status: AC

## 2017-10-26 MED ORDER — CEPHALEXIN 500 MG PO CAPS: 500.0000 mg | ORAL_CAPSULE | Freq: Four times a day (QID) | ORAL | 0 refills | Status: DC

## 2017-10-26 MED FILL — FLUCONAZOLE 200 MG TABLET: 200 | 14 days supply | Qty: 14 | Fill #0

## 2017-10-26 MED FILL — CEPHALEXIN 500 MG CAPSULE: 500 | 7 days supply | Qty: 28 | Fill #0

## 2017-10-26 NOTE — ED Triage Notes (Signed)
Patient was seen 10/23/17 for headache which has resolved; now has general body aches and wonders is she has UTI and/order having rxn to her terbinafine which she has been taking for possible ring worm/hair loss.

## 2017-10-26 NOTE — Discharge Instructions (Addendum)
Schedule to see Iran Planas PA_C for recheck in 1 week

## 2017-10-27 NOTE — ED Provider Notes (Signed)
[facility]    CSN: [id number] Arrival date & time: 10/26/17  1551     History   Chief Complaint Chief Complaint  Patient presents with  . Generalized Body Aches  . Dysuria    HPI Jennifer Erickson is a 47 y.o. female.   The history is provided by the patient. No language interpreter was used.  Dysuria  Pain quality:  Aching Pain severity:  Moderate Onset quality:  Gradual Duration:  2 days Timing:  Constant Progression:  Worsening Chronicity:  New Relieved by:  None tried Ineffective treatments:  None tried Urinary symptoms: no discolored urine   Associated symptoms: no abdominal pain    Pt thinks she is having a reaction to the medicine that she was prescribed.  Patient is on prescription Lamisil for tinea capitis.  Patient reports she also has a rash on her feet now.  Patient reports she continues to have hair loss in circles does not feel like she is getting any better it does not have an appointment with dermatology until November Past Medical History:  Diagnosis Date  . Personal history of amenorrhea   . Sinusitis     Patient Active Problem List   Diagnosis Date Noted  . Elevated blood pressure reading 09/24/2017  . Coughing 07/03/2017  . Grief reaction 12/26/2016  . Lateral epicondylitis of both elbows 12/03/2016  . Bilateral shoulder pain 12/03/2016  . Toe fracture, right 04/15/2016  . Polyarthralgia 02/18/2016  . Cramps of left lower extremity 11/28/2015  . 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 Palpitations 07/11/2015  . OSA on CPAP 07/10/2015  . Large breasts 05/28/2015  . Primary osteoarthritis of both knees 12/08/2014  . Flank pain 11/01/2014  . Vitamin D deficiency 04/19/2014  . Left lumbar radiculitis 01/25/2014  . Bilateral swelling of feet 05/02/2013  . Left breast mass 11/22/2012  . Obstructive sleep apnea 11/22/2012  . Plantar fasciitis, bilateral 11/01/2012  . Dermatitis 05/26/2012  . Uvular swelling 12/30/2011  . Insomnia  12/30/2011  . Obesity, Class III, BMI 40-49.9 (morbid obesity) ([place]) 08/19/2011    Past Surgical History:  Procedure Laterality Date  . DILATION AND CURETTAGE OF UTERUS    . TONSILLECTOMY      OB History   None      Home Medications    Prior to Admission medications   Medication Sig Start Date End Date Taking? Authorizing Provider  cephALEXin (KEFLEX) 500 MG capsule Take 1 capsule (500 mg total) by mouth 4 (four) times daily. 10/26/17   Fransico Meadow, PA-C  EPINEPHrine (EPIPEN 2-PAK) 0.3 mg/0.3 mL IJ SOAJ injection INJECT 0.3 MLS (0.3 MG TOTAL) INTO THE MUSCLE ONCE. 10/28/16   Trixie Dredge, PA-C  etonogestrel (IMPLANON) 68 MG IMPL implant Inject 1 each into the skin once.    [provider]  fluconazole (DIFLUCAN) 200 MG tablet Take 1 tablet (200 mg total) by mouth daily for 14 days. 10/26/17 11/09/17  Fransico Meadow, PA-C  terbinafine (LAMISIL) 250 MG tablet Take 1 tablet (250 mg total) by mouth daily. 09/24/17 11/05/17  Trixie Dredge, PA-C    Family History Family History  Problem Relation Age of Onset  . Cancer Mother   . Hypertension Father   . Diabetes Brother     Social History Social History   Tobacco Use  . Smoking status: Never Smoker  . Smokeless tobacco: Never Used  Substance Use Topics  . Alcohol use: No  . Drug use: No     Allergies  Aspirin; Augmentin [amoxicillin-pot clavulanate]; Ciprofloxacin; Hydrocodone; and Ivp dye [iodinated diagnostic agents]   Review of Systems Review of Systems  Gastrointestinal: Negative for abdominal pain.  Genitourinary: Positive for dysuria.     Physical Exam Triage Vital Signs ED Triage Vitals  Enc Vitals Group     BP 10/26/17 1631 (!) 144/74     Pulse Rate 10/26/17 1631 89     Resp 10/26/17 1631 16     Temp 10/26/17 1631 98 F (36.7 C)     Temp Source 10/26/17 1631 Oral     SpO2 10/26/17 1631 98 %     Weight --      Height --      Head Circumference --      Peak  Flow --      Pain Score 10/26/17 1632 2     Pain Loc --      Pain Edu? --      Excl. in [place]? --    No data found.  Updated Vital Signs BP (!) 144/74 (BP Location: Right Arm)   Pulse 89   Temp 98 F (36.7 C) (Oral)   Resp 16   SpO2 98%   Visual Acuity Right Eye Distance:   Left Eye Distance:   Bilateral Distance:    Right Eye Near:   Left Eye Near:    Bilateral Near:     Physical Exam  Constitutional: She appears well-developed and well-nourished.  HENT:  Head: Normocephalic.  Couple areas round hair loss scalp  Eyes: Pupils are equal, round, and reactive to light. Conjunctivae are normal.  Neck: Normal range of motion.  Cardiovascular: Normal rate.  Pulmonary/Chest: Effort normal.  Abdominal: Soft.  Musculoskeletal: Normal range of motion.  Bilateral feet dry scaling on sides with discoloration  Neurological: She is alert.  Skin: Skin is warm.  Psychiatric: She has a normal mood and affect.  Nursing note and vitals reviewed.    UC Treatments / Results  Labs (all labs ordered are listed, but only abnormal results are displayed) Labs Reviewed  POCT URINALYSIS DIP (MANUAL ENTRY) - Abnormal; Notable for the following components:      Result Value   Color, UA light yellow (*)    Clarity, UA cloudy (*)    Nitrite, UA Positive (*)    Leukocytes, UA Small (1+) (*)    All other components within normal limits  URINE CULTURE    EKG None  Radiology No results found.  Procedures Procedures (including critical care time)  Medications Ordered in UC Medications - No data to display  Initial Impression / Assessment and Plan / UC Course  I have reviewed the triage vital signs and the nursing notes.  Pertinent labs & imaging results that were available during my care of the patient were reviewed by me and considered in my medical decision making (see chart for details).      Final Clinical Impressions(s) / UC Diagnoses   Final diagnoses:  Tinea capitis    Tinea pedis of both feet  Urinary tract infection without hematuria, site unspecified     Discharge Instructions     Schedule to see Iran Planas PA_C for recheck in 1 week    ED Prescriptions    Medication Sig Dispense Auth. Provider   fluconazole (DIFLUCAN) 200 MG tablet Take 1 tablet (200 mg total) by mouth daily for 14 days. 14 tablet Dorita Rowlands K, [place]   cephALEXin (KEFLEX) 500 MG capsule Take 1 capsule (500 mg total) by  mouth 4 (four) times daily. 28 capsule Fransico Meadow, [place]     Controlled Substance Prescriptions [place] Controlled Substance Registry consulted? Not Applicable  Patient advised to follow-up with Iran Planas for recheck keep appointment as scheduled with otology   Fransico Meadow, [place] 10/27/17 2334

## 2017-10-28 ENCOUNTER — Telehealth: Payer: Self-pay | Admitting: Emergency Medicine

## 2017-10-28 LAB — URINE CULTURE
MICRO NUMBER: 90894693
SPECIMEN QUALITY: ADEQUATE

## 2017-11-02 ENCOUNTER — Encounter: Payer: Self-pay | Admitting: Osteopathic Medicine

## 2017-11-02 ENCOUNTER — Ambulatory Visit (INDEPENDENT_AMBULATORY_CARE_PROVIDER_SITE_OTHER): Payer: Federal, State, Local not specified - PPO | Admitting: Osteopathic Medicine

## 2017-11-02 VITALS — BP 139/81 | HR 84 | Temp 98.2°F | Wt 279.0 lb

## 2017-11-02 DIAGNOSIS — R8290 Unspecified abnormal findings in urine: Secondary | ICD-10-CM

## 2017-11-02 DIAGNOSIS — R5383 Other fatigue: Secondary | ICD-10-CM | POA: Diagnosis not present

## 2017-11-02 LAB — POCT URINALYSIS DIPSTICK
Bilirubin, UA: NEGATIVE
GLUCOSE UA: NEGATIVE
Ketones, UA: NEGATIVE
NITRITE UA: NEGATIVE
PROTEIN UA: NEGATIVE
Spec Grav, UA: >=1.03 — AB (ref 1.010–1.025)
Urobilinogen, UA: 0.2 E.U./dL
pH, UA: 5.5 (ref 5.0–8.0)

## 2017-11-02 NOTE — Progress Notes (Signed)
HPI: Jennifer Erickson is a 47 y.o. female who  has a past medical history of Personal history of amenorrhea and Sinusitis.  she presents to [facility] today, 11/02/17,  for chief complaint of:  Fatigue, urine problems   History to this point:  Seen recently in UC 10/26/17, cc body aches and dysuria. Concern for possible Lamisil reaction (treating tinea capitis, hair loss in circular patches, of note her dogs were checked by the vet and they had no problems). Also c/o bilateral scaling on feet. UA (+) nitrites and small leuks, (+)UCx citrobacter pansensitive. She was given keflex for UTI and diflucan for tinea capitis and pedis.   Prior to that, was seen in [facility] 10/23/17 for mild generalized headache and ear pain.   On 09/24/17 she saw Evlyn Clines here for patchy hair loss and was treated for tinea capitis w/ Lamisil x6 weeks, ddx included psoriasis, alopecia areata, traction alopecia. Derm referral placed. No consitutional symptoms at that time. Photo of hair loss in included in that note.   No serum labs since 06/2017: CMP and CBC ok, TSH ok - was c/o fatigue at that pont and labs ordered by Dr. Darene Lamer.    Since then has had very low energy, R flank pain, pain down back of legs. Main concern is fatigue -having difficulty going about her day, everything just kind of wipes her out.  No chest pain, pressure, shortness of breath, night sweats, fever.  No known tick encounter.  Headache was mild and has since resolved, no widespread body rash.. Still feels like possible UTI -reports some urinary frequency and weird odor, no dysuria.  Reports some right-sided lower back pain.     Past medical history, surgical history, and family history reviewed.  Current medication list and allergy/intolerance information reviewed.   (See remainder of HPI, ROS, Phys Exam below)    Results for orders placed or performed in visit on 11/02/17 (from the past 72 hour(s))  POCT  Urinalysis Dipstick     Status: Abnormal   Collection Time: 11/02/17  9:42 AM  Result Value Ref Range   Color, UA YELLOW    Clarity, UA CLEAR    Glucose, UA Negative Negative   Bilirubin, UA Negative    Ketones, UA Negative    Spec Grav, UA >=1.030 (A) 1.010 - 1.025   Blood, UA Trace-lysed    pH, UA 5.5 5.0 - 8.0   Protein, UA Negative Negative   Urobilinogen, UA 0.2 0.2 or 1.0 E.U./dL   Nitrite, UA Negative    Leukocytes, UA Small (1+) (A) Negative   Appearance     Odor       ASSESSMENT/PLAN: Fatigue seems like most concerning symptom at this point, patient is visibly tired in the room.  Vital signs are stable.  No chest pain or shortness of breath, no B symptoms concerning for occult infection/malignancy, would certainly want to rule out tickborne illnesses given recent headache and frequent outdoors time.   Abnormal urine - Plan: POCT Urinalysis Dipstick, Urinalysis, microscopic only, Urine Culture, [place] spotted fvr abs pnl(IgG+IgM), CBC, COMPLETE METABOLIC PANEL WITH GFR, TSH, B. burgdorfi antibodies  Fatigue, unspecified type - Plan: [place] spotted fvr abs pnl(IgG+IgM), CBC, COMPLETE METABOLIC PANEL WITH GFR, TSH, B. burgdorfi antibodies    Patient Instructions  At this point, I am not sure what could explain all of your symptoms.  A urinary infection is possible, I would like to get some additional blood work and  urine testing for some more information to rule out other things like tickborne infections, other problems with organ function, etc.  We may also consider a CT scan for further evaluation of the pain you are having, this may be due to a kidney stone though that seems unlikely since there was not a stone on your left side back in January.  If your symptoms worsen, or new or concerning symptoms develop, please let us know or seek emergency care if needed after hours.   Follow-up plan: Return for recheck depending on results/symptoms  .     ############################################ ############################################ ############################################ ############################################    Outpatient Encounter Medications as of 11/02/2017  Medication Sig  . cephALEXin (KEFLEX) 500 MG capsule Take 1 capsule (500 mg total) by mouth 4 (four) times daily.  [name] EPINEPHrine (EPIPEN 2-PAK) 0.3 mg/0.3 mL IJ SOAJ injection INJECT 0.3 MLS (0.3 MG TOTAL) INTO THE MUSCLE ONCE.  [name] etonogestrel (IMPLANON) 68 MG IMPL implant Inject 1 each into the skin once.  . fluconazole (DIFLUCAN) 200 MG tablet Take 1 tablet (200 mg total) by mouth daily for 14 days.  [name] terbinafine (LAMISIL) 250 MG tablet Take 1 tablet (250 mg total) by mouth daily.   No facility-administered encounter medications on file as of 11/02/2017.    Allergies  Allergen Reactions  . Aspirin Other (See Comments)    GI issues  . Augmentin [Amoxicillin-Pot Clavulanate] Diarrhea  . Ciprofloxacin     Nausea and vomiting.   [name] Hydrocodone     Cough syrup/nausea  . Ivp Dye [Iodinated Diagnostic Agents]       Review of Systems:  Constitutional: No recent illness  HEENT: No  headache, no vision change  Cardiac: No  chest pain, No  pressure, No palpitations  Respiratory:  No  shortness of breath. No  Cough  Gastrointestinal: No  abdominal pain, no change on bowel habits  Musculoskeletal: +myalgia/arthralgia - back pain as per HPI  Skin: No  Rash  Hem/Onc: No  easy bruising/bleeding, No  abnormal lumps/bumps  Neurologic: +generalized but no focal weakness, No  Dizziness   Exam:  BP 139/81 (BP Location: Left Arm, Patient Position: Sitting, Cuff Size: Large)   Pulse 84   Temp 98.2 F (36.8 C) (Oral)   Wt 279 lb (126.6 kg)   BMI 45.03 kg/m   Constitutional: VS see above. General Appearance: alert, well-developed, well-nourished, NAD  Eyes: Normal lids and conjunctive, non-icteric sclera  Ears, Nose, Mouth, Throat: MMM,  Normal external inspection ears/nares/mouth/lips/gums.  Neck: No masses, trachea midline.   Respiratory: Normal respiratory effort. no wheeze, no rhonchi, no rales  Cardiovascular: S1/S2 normal, no murmur, no rub/gallop auscultated. RRR.   Musculoskeletal: Gait normal. Symmetric and independent movement of all extremities, neg SLR, no nuchal rigidity   Neurological: Normal balance/coordination. No tremor.  Skin: warm, dry, intact.   Psychiatric: Normal judgment/insight. Normal mood and affect. Oriented x3.   Visit summary with medication list and pertinent instructions was printed for patient to review, advised to alert [place] if any changes needed. All questions at time of visit were answered - patient instructed to contact office with any additional concerns. ER/RTC precautions were reviewed with the patient and understanding verbalized.   Follow-up plan: Return for recheck depending on results/symptoms .  Note: Total time spent 25 minutes, greater than 50% of the visit was spent face-to-face counseling and coordinating care for the following: The primary encounter diagnosis was Abnormal urine. A diagnosis of Fatigue, unspecified type was also pertinent to  this visit.[name]  Please note: voice recognition software was used to produce this document, and typos may escape review. Please contact Dr. Sheppard Coil for any needed clarifications.

## 2017-11-02 NOTE — Patient Instructions (Signed)
At this point, I am not sure what could explain all of your symptoms.  A urinary infection is possible, I would like to get some additional blood work and urine testing for some more information to rule out other things like tickborne infections, other problems with organ function, etc.  We may also consider a CT scan for further evaluation of the pain you are having, this may be due to a kidney stone though that seems unlikely since there was not a stone on your left side back in January.  If your symptoms worsen, or new or concerning symptoms develop, please let us know or seek emergency care if needed after hours.

## 2017-11-03 LAB — ROCKY MTN SPOTTED FVR ABS PNL(IGG+IGM)
RMSF IGM: NOT DETECTED
RMSF IgG: NOT DETECTED

## 2017-11-03 LAB — COMPLETE METABOLIC PANEL WITH GFR
AG Ratio: 1 (calc) (ref 1.0–2.5)
ALBUMIN MSPROF: 3.9 g/dL (ref 3.6–5.1)
ALT: 18 U/L (ref 6–29)
AST: 16 U/L (ref 10–35)
Alkaline phosphatase (APISO): 104 U/L (ref 33–115)
BUN: 16 mg/dL (ref 7–25)
CO2: 25 mmol/L (ref 20–32)
CREATININE: 0.8 mg/dL (ref 0.50–1.10)
Calcium: 9 mg/dL (ref 8.6–10.2)
Chloride: 105 mmol/L (ref 98–110)
GFR, Est African American: 102 mL/min/{1.73_m2} (ref >=60)
GFR, Est Non African American: 88 mL/min/{1.73_m2} (ref >=60)
GLUCOSE: 106 mg/dL — AB (ref 65–99)
Globulin: 4 g/dL (calc) — ABNORMAL HIGH (ref 1.9–3.7)
Potassium: 4.1 mmol/L (ref 3.5–5.3)
SODIUM: 139 mmol/L (ref 135–146)
TOTAL PROTEIN: 7.9 g/dL (ref 6.1–8.1)
Total Bilirubin: 0.3 mg/dL (ref 0.2–1.2)

## 2017-11-03 LAB — URINALYSIS, MICROSCOPIC ONLY: WBC, UA: >=60 /HPF — AB (ref 0–5)

## 2017-11-03 LAB — URINE CULTURE
MICRO NUMBER: 90925083
SPECIMEN QUALITY:: ADEQUATE

## 2017-11-03 LAB — CBC
HCT: 40.7 % (ref 35.0–45.0)
HEMOGLOBIN: 13.5 g/dL (ref 11.7–15.5)
MCH: 30.1 pg (ref 27.0–33.0)
MCHC: 33.2 g/dL (ref 32.0–36.0)
MCV: 90.6 fL (ref 80.0–100.0)
MPV: 10.2 fL (ref 7.5–12.5)
Platelets: 274 10*3/uL (ref 140–400)
RBC: 4.49 10*6/uL (ref 3.80–5.10)
RDW: 12.6 % (ref 11.0–15.0)
WBC: 7.4 10*3/uL (ref 3.8–10.8)

## 2017-11-03 LAB — B. BURGDORFI ANTIBODIES: B burgdorferi Ab IgG+IgM: <0.9 index

## 2017-11-03 LAB — TSH: TSH: 3.41 m[IU]/L

## 2017-11-03 MED ORDER — SULFAMETHOXAZOLE-TRIMETHOPRIM 800-160 MG PO TABS: 1.0000 | ORAL_TABLET | Freq: Two times a day (BID) | ORAL | 0 refills | Status: DC

## 2017-11-03 MED FILL — SULFAMETHOXAZOLE-TMP DS TAB: 800-160 | 7 days supply | Qty: 14 | Fill #0

## 2017-11-03 NOTE — Addendum Note (Signed)
Addended by: Maryla Morrow on: 11/03/2017 02:22 PM   Modules accepted: Orders

## 2017-11-10 ENCOUNTER — Encounter: Payer: Self-pay | Admitting: Family Medicine

## 2017-11-10 ENCOUNTER — Ambulatory Visit (INDEPENDENT_AMBULATORY_CARE_PROVIDER_SITE_OTHER): Payer: Federal, State, Local not specified - PPO | Admitting: Family Medicine

## 2017-11-10 VITALS — BP 132/89 | Temp 97.7°F | Wt 276.0 lb

## 2017-11-10 DIAGNOSIS — R109 Unspecified abdominal pain: Secondary | ICD-10-CM | POA: Diagnosis not present

## 2017-11-10 DIAGNOSIS — M2550 Pain in unspecified joint: Secondary | ICD-10-CM

## 2017-11-10 DIAGNOSIS — R5383 Other fatigue: Secondary | ICD-10-CM

## 2017-11-10 DIAGNOSIS — L309 Dermatitis, unspecified: Secondary | ICD-10-CM | POA: Diagnosis not present

## 2017-11-10 DIAGNOSIS — R252 Cramp and spasm: Secondary | ICD-10-CM

## 2017-11-10 DIAGNOSIS — M791 Myalgia, unspecified site: Secondary | ICD-10-CM

## 2017-11-10 DIAGNOSIS — G4733 Obstructive sleep apnea (adult) (pediatric): Secondary | ICD-10-CM

## 2017-11-10 DIAGNOSIS — D869 Sarcoidosis, unspecified: Secondary | ICD-10-CM

## 2017-11-10 DIAGNOSIS — L669 Cicatricial alopecia, unspecified: Secondary | ICD-10-CM

## 2017-11-10 NOTE — Progress Notes (Signed)
Jennifer Erickson is a 47 y.o. female who presents to [place]: [place] today for hair loss, scalp irritation and lesions, muscle pains, and fatigue.  Jennifer "Erickson" has a several week history of scalp irritation and hair loss.  She describes itchy patches in her scalp ongoing since June.  She is been seen by several different medical providers for this issue.  In June she was thought to have tinea capitis and was prescribed terbinafine.  She developed fatigue and muscle pain and terbinafine was discontinued in place of fluconazole about a month later on July 29 in urgent care.  She notes her scalp is still itchy and she is losing more hair.  She finds this quite distressing.  She is been trying topical tea tree oil and other over-the-counter treatments which have not helped.  She also notes at the same time muscle pains aches and cramps.  She describes flank pain leg pain and arm pain associated with cramping.  She denies any new rash aside from the scalp lesion.  She has had some work-up for this including negative [facility] spotted fever titers, normal TSH relatively normal CBC and metabolic panel.  She denies a personal history of rheumatologic diseases.  Additionally she notes fatigue.  This is been ongoing around about the same time.  She is not sure if her medications are to blame or not.  She does have a history of sleep apnea and is not currently using her CPAP.  She notes she finds the mass to be uncomfortable but has not contacted her CPAP supplier to adjust her mass.  She is not sure what her CPAP settings are.  Jennifer "Erickson" Is not sure who exactly her CPAP supplier is either.  Jennifer "Erickson" recently has had dysuria and urinary frequency.  This is been around about the same time and she is been prescribed antibiotics for urinary frequency and dysuria thought  to be a urinary tract infection.  Urine culture on July 29 was positive for Citrobacter pansensitive.  She was prescribed Keflex but had symptoms described above thought to be possible side effects and was switched to Bactrim.  This which happened on August 5.  Urine culture at that time was subsequently negative.  Currently is taking Bactrim daily.  She notes sinus congestion and discharge.  This is been ongoing for a few days and she is worried she may have a bacterial sinus infection.  No fevers chills vomiting or diarrhea.  Patient notes sinus pain and pressure and right ear pain.  Symptoms are mild.  No significant over-the-counter treatment tried yet.   ROS as above:  Exam:  BP 132/89   Temp 97.7 F (36.5 C) (Oral)   Wt 276 lb (125.2 kg)   BMI 44.55 kg/m  Gen: Well NAD HEENT: EOMI,  MMM normal posterior pharynx.  Nasal turbinates are mildly inflamed and erythematous.  Maxillary frontal sinuses are nontender.  Normal tympanic membranes bilaterally.  Mastoids tender bilaterally.  No significant cervical lymphadenopathy. Lungs: Normal work of breathing. CTABL Heart: RRR no MRG Abd: NABS, Soft. Nondistended, Nontender Exts: Brisk capillary refill, warm and well perfused.   Scalp: Patient has areas of hyperpigmented and slightly raised patches on her scalp approximately 1 to 2 cm.  This is 3 areas at the frontal scalp left parietal and posterior scalp.  She has central hair loss with what appears to be scarring and some erythema at the  margins.  She does have some scale on 1 of the posterior patch.  The skin is mildly tender and pruritic.  No other skin changes are visible.  Hair is not friable.  Lab and Radiology Results   Chemistry      Component Value Date/Time   NA 139 11/02/2017 0954   K 4.1 11/02/2017 0954   CL 105 11/02/2017 0954   CO2 25 11/02/2017 0954   BUN 16 11/02/2017 0954   CREATININE 0.80 11/02/2017 0954      Component Value Date/Time   CALCIUM 9.0 11/02/2017 0954     ALKPHOS 104 05/28/2015 1404   AST 16 11/02/2017 0954   ALT 18 11/02/2017 0954   BILITOT 0.3 11/02/2017 0954     Lab Results  Component Value Date   WBC 7.4 11/02/2017   HGB 13.5 11/02/2017   HCT 40.7 11/02/2017   MCV 90.6 11/02/2017   PLT 274 11/02/2017   Lab Results  Component Value Date   TSH 3.41 11/02/2017      Assessment and Plan: 47 y.o. 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  I am not convinced this is tinea capitis.  It is possible she has partially treated tinea capitis and the appearance is not consistent with typical appearance.  The fact that she did not get much better at all with 4 weeks of terbinafine and now about 2 weeks of fluconazole orally makes me think this is probably not tinea capitis.  It is possible she has some underlying interconnected diagnosis like lupus that could explain some of her symptoms.  I think it is reasonable to proceed with a rheumatologic work-up.  Additionally patient will reschedule for dedicated scalp punch biopsy this week.  This will certainly be helpful and making a diagnosis of the cause of her what looks to be scarring alopecia.  Dissipate were going to be treating the alopecia with topical steroids but I would like to avoid steroid application before the biopsy as that may make diagnosis harder.  She does have an appointment scheduled with dermatology in November.  Myalgias: Unclear etiology.  Patient certainly does have myalgias and again I am somewhat worried about underlying fundamental rheumatologic problem.  Fatigue: The obvious low hanging fruit hears untreated sleep apnea.  That will certainly cause fatigue.  While in the process of evaluating him potentially treating for a possible underlying metabolic or rheumatologic problem I think is reasonable to restart treatment with CPAP.  Patient will contact her CPAP supplier to get a new mask fitting.  I will work  with PCP to help adjust CPAP settings in the near future.  She may benefit from a ramp or auto titration settings.  No evidence of bacterial sinusitis.  Discontinue oral antibiotics at this time.  Proceed with oral over-the-counter medications as needed.  Orders Placed This Encounter  Procedures  . Cyclic citrul peptide antibody, IgG  . Sedimentation rate  . Rheumatoid factor  . ANA  . CK  . Angiotensin converting enzyme   No orders of the defined types were placed in this encounter.    Historical information moved to improve visibility of documentation.  Past Medical History:  Diagnosis Date  . Personal history of amenorrhea   . Sinusitis    Past Surgical History:  Procedure Laterality Date  . DILATION AND CURETTAGE OF UTERUS    . TONSILLECTOMY     Social History   Tobacco Use  .  Smoking status: Never Smoker  . Smokeless tobacco: Never Used  Substance Use Topics  . Alcohol use: No   family history includes Cancer in her mother; Diabetes in her brother; Hypertension in her father.  Medications: Current Outpatient Medications  Medication Sig Dispense Refill  . EPINEPHrine (EPIPEN 2-PAK) 0.3 mg/0.3 mL IJ SOAJ injection INJECT 0.3 MLS (0.3 MG TOTAL) INTO THE MUSCLE ONCE. 1 Device 4  . etonogestrel (IMPLANON) 68 MG IMPL implant Inject 1 each into the skin once.     No current facility-administered medications for this visit.    Allergies  Allergen Reactions  . Aspirin Other (See Comments)    GI issues  . Augmentin [Amoxicillin-Pot Clavulanate] Diarrhea  . Ciprofloxacin     Nausea and vomiting.   [name] Hydrocodone     Cough syrup/nausea  . Ivp Dye [Iodinated Diagnostic Agents]      Discussed warning signs or symptoms. Please see discharge instructions. Patient expresses understanding.

## 2017-11-10 NOTE — Patient Instructions (Signed)
Thank you for coming in today. Contact your CPAP supplier and ask for a new mask fitting.  I am happy to write a letter or an order if needed.  I am also happy to adjust the CPAP settings.  Restart CPAP.  Get labs and schedule for Scalp biopsy with me ASAP.   Scaring Alopecia.   Systemic Lupus Erythematosus, Adult Systemic lupus erythematosus is a long-term (chronic) disease that can affect many parts of the body. It can damage the skin, joints, blood vessels, brain, kidneys, lungs, heart, and other internal organs. It causes pain, irritation, and inflammation. Systemic lupus erythematosus is an autoimmune disease. With this type of disease, the body's defense system (immune system) mistakenly attacks normal tissues instead of attacking germs or abnormal growths. What are the causes? The cause of this condition is not known. What increases the risk? This condition is more likely to develop in:  Females.  People of Asian descent.  People of African-American descent.  People who have a family history of the condition.  What are the signs or symptoms? General symptoms include:  Joint pain and swelling (common).  Fever.  Fatigue.  Unusual weight loss or weight gain.  Skin rashes, especially over the nose and cheeks (butterfly rash) and after sun exposure.  Sores inside the mouth or nose.  Other symptoms depend on which parts of the body are affected. They can include:  Shortness of breath.  Chest pain.  Frequent urination.  Blood in the urine.  Seizures.  Mental changes.  Hair loss.  Swollen and tender lymph nodes.  Swelling of the hands or feet.  Symptoms can come and go. A period of time when symptoms get worse or come back is called a flare. A period of time with no symptoms is called a remission. How is this diagnosed? This condition is diagnosed based on symptoms, a medical history, and a physical exam. You may also have tests, including:  Blood  tests.  Urine tests.  A chest X-ray.  A skin or kidney biopsy. For this test, a sample of tissue is taken from the skin or kidney and studied under a microscope.  You may be referred to an autoimmune disease specialist (rheumatologist). How is this treated? There is no cure for this condition, but treatment can keep the disease in remission, help to control symptoms, and prevent damage to the heart, lungs, kidneys, and other organs. Treatment may involve taking a combination of medicines over time. Follow these instructions at home: Medicines  Take medicines only as directed by your health care provider.  Do not take any medicines that contain estrogen without first checking with your health care provider. Estrogen can trigger flares and may increase your risk for blood clots. Lifestyle  Eat a heart-healthy diet.  Stay active as directed by your health care provider.  Do not smoke. If you need help quitting, ask your health care provider.  Protect your skin from the sun by applying sunblock and wearing protective hats and clothing.  Learn as much as you can about your condition and have a good support system in place. Support may come from family, friends, or a lupus support group. General instructions  Keep all follow-up visits as directed by your health care provider. This is important.  Work closely with all of your health care providers to manage your condition.  Let your health care provider know right away if you become pregnant or if you plan to become pregnant. Pregnancy in women with  this condition is considered high risk. Contact a health care provider if:  You have a fever.  Your symptoms flare.  You develop new symptoms.  You develop swollen feet or hands.  You develop puffiness around your eyes.  Your medicines are not working.  You have bloody, foamy, or coffee-colored urine.  There are changes in your urination. For example, you urinate more often at  night.  You think that you may be depressed or have anxiety. Get help right away if:  You have chest pain.  You have trouble breathing.  You have a seizure.  You suddenly get a very bad headache.  You suddenly develop facial or body weakness.  You cannot speak.  You cannot understand speech. This information is not intended to replace advice given to you by your health care provider. Make sure you discuss any questions you have with your health care provider. Document Released: 03/07/2002 Document Revised: 11/11/2015 Document Reviewed: 02/22/2014 Elsevier Interactive Patient Education  [employer].

## 2017-11-13 ENCOUNTER — Ambulatory Visit (INDEPENDENT_AMBULATORY_CARE_PROVIDER_SITE_OTHER): Payer: Federal, State, Local not specified - PPO | Admitting: Family Medicine

## 2017-11-13 VITALS — BP 142/90 | HR 93 | Temp 98.6°F | Wt 276.0 lb

## 2017-11-13 DIAGNOSIS — G4733 Obstructive sleep apnea (adult) (pediatric): Secondary | ICD-10-CM

## 2017-11-13 DIAGNOSIS — M2550 Pain in unspecified joint: Secondary | ICD-10-CM | POA: Diagnosis not present

## 2017-11-13 DIAGNOSIS — L669 Cicatricial alopecia, unspecified: Secondary | ICD-10-CM | POA: Diagnosis not present

## 2017-11-13 DIAGNOSIS — Z9989 Dependence on other enabling machines and devices: Secondary | ICD-10-CM | POA: Diagnosis not present

## 2017-11-13 DIAGNOSIS — R252 Cramp and spasm: Secondary | ICD-10-CM | POA: Diagnosis not present

## 2017-11-13 DIAGNOSIS — L989 Disorder of the skin and subcutaneous tissue, unspecified: Secondary | ICD-10-CM | POA: Diagnosis not present

## 2017-11-13 DIAGNOSIS — R109 Unspecified abdominal pain: Secondary | ICD-10-CM | POA: Diagnosis not present

## 2017-11-13 DIAGNOSIS — L668 Other cicatricial alopecia: Secondary | ICD-10-CM | POA: Diagnosis not present

## 2017-11-13 DIAGNOSIS — L309 Dermatitis, unspecified: Secondary | ICD-10-CM | POA: Diagnosis not present

## 2017-11-13 MED ORDER — FLUOCINONIDE 0.05 % EX SOLN: 1.0000 "application " | Freq: Two times a day (BID) | CUTANEOUS | 12 refills | Status: DC

## 2017-11-13 MED ORDER — AMBULATORY NON FORMULARY MEDICATION: 0 refills | Status: DC

## 2017-11-13 MED FILL — FLUOCINONIDE 0.05 % SOLN: 0.05 | 30 days supply | Qty: 60 | Fill #0

## 2017-11-13 NOTE — Patient Instructions (Addendum)
Thank you for coming in today. Use the solution on the scalp itchy sports.  I will contact you with results.

## 2017-11-13 NOTE — Progress Notes (Signed)
Jennifer Erickson is a 47 y.o. female who presents to [place]: [place] today for scalp lesions and discuss fatigue.   First name was seen earlier this week for persistent scalp lesions.  She had failed typical treatment for tinea capitis and I was concerned she has a form of scarring alopecia.  Rheumatologic labs were ordered but have not been done yet.  Patient and she will get them done today.  Additionally patient was not treated as she was asked to return today for scalp biopsy.  Additionally she notes fatigue.  This was also discussed the last visit and thought to be probably due to having sleep apnea not using CPAP.  She brings in paperwork today showing a sleep study with titration from 2013 showing sleep apnea with best settings for CPAP at around 13 cm of water.  She is noted last time is not using CPAP due to mask fit mask issues.   ROS as above:  Exam:  BP (!) 142/90   Pulse 93   Temp 98.6 F (37 C) (Oral)   Wt 276 lb (125.2 kg)   BMI 44.55 kg/m  Gen: Well NAD HEENT: EOMI,  MMM Lungs: Normal work of breathing. CTABL Heart: RRR no MRG Abd: NABS, Soft. Nondistended, Nontender Exts: Brisk capillary refill, warm and well perfused.  Scalp: Scarring mildly erythematous circular lesions across scalp one identified at the right parietal scalp.  Punch biopsy: Consent obtained and timeout performed.  Skin cleaned with rubbing alcohol and cold spray applied.  1 mL of lidocaine with epinephrine injected achieving good anesthesia. After waiting 15 minutes the skin was again sterilized using rubbing alcohol.  Two 4 mm punch biopsies were used at the margins of the lesion to obtain a good skin sample. Pressure dressing was applied and bleeding stopped after less than 2 minutes.  Patient Tolerated procedure well.    Assessment and Plan: 47 y.o. female with  Scalp  lesion: Status post biopsy today.  Additionally rheumatologic work-up ordered at last visit is pending.  Empiric treatment with high potency steroid solution or cream.  Fatigue: Likely sleep apnea.  Reordered CPAP.  Will send with copy of sleep study documentation.  [facility]  I spent 15 minutes with this patient, greater than 50% was face-to-face time counseling regarding fatigue and CPAP setting and plan as well as likely diagnosis. .    No orders of the defined types were placed in this encounter.  Meds ordered this encounter  Medications  . AMBULATORY NON FORMULARY MEDICATION    Sig: Continuous positive airway pressure (CPAP) machine auto-titrate from 4-20 cm of H2O pressure, with all supplemental supplies as needed. Patient previously managed with [facility] Send 30 day report to Dr Georgina Snell [phone] after 30 days    Dispense:  1 each    Refill:  0  . fluocinonide (LIDEX) 0.05 % external solution    Sig: Apply 1 application topically 2 (two) times daily.    Dispense:  60 mL    Refill:  12     Historical information moved to improve visibility of documentation.  Past Medical History:  Diagnosis Date  . Personal history of amenorrhea   . Sinusitis    Past Surgical History:  Procedure Laterality Date  . DILATION AND CURETTAGE OF UTERUS    . TONSILLECTOMY     Social History   Tobacco Use  . Smoking status:  Never Smoker  . Smokeless tobacco: Never Used  Substance Use Topics  . Alcohol use: No   family history includes Cancer in her mother; Diabetes in her brother; Hypertension in her father.  Medications: Current Outpatient Medications  Medication Sig Dispense Refill  . EPINEPHrine (EPIPEN 2-PAK) 0.3 mg/0.3 mL IJ SOAJ injection INJECT 0.3 MLS (0.3 MG TOTAL) INTO THE MUSCLE ONCE. 1 Device 4  . etonogestrel (IMPLANON) 68 MG IMPL implant Inject 1 each into the skin once.    . AMBULATORY NON FORMULARY MEDICATION Continuous  positive airway pressure (CPAP) machine auto-titrate from 4-20 cm of H2O pressure, with all supplemental supplies as needed. Patient previously managed with [facility] 30 day report to Dr Georgina Snell [phone] after 30 days 1 each 0  . fluocinonide (LIDEX) 0.05 % external solution Apply 1 application topically 2 (two) times daily. 60 mL 12   No current facility-administered medications for this visit.    Allergies  Allergen Reactions  . Aspirin Other (See Comments)    GI issues  . Augmentin [Amoxicillin-Pot Clavulanate] Diarrhea  . Ciprofloxacin     Nausea and vomiting.   [name] Hydrocodone     Cough syrup/nausea  . Ivp Dye [Iodinated Diagnostic Agents]      Discussed warning signs or symptoms. Please see discharge instructions. Patient expresses understanding.

## 2017-11-16 NOTE — Addendum Note (Signed)
Addended by: Gregor Hams on: 11/16/2017 07:26 AM   Modules accepted: Orders

## 2017-11-17 ENCOUNTER — Encounter: Payer: Self-pay | Admitting: Family Medicine

## 2017-11-17 ENCOUNTER — Telehealth: Payer: Self-pay | Admitting: Family Medicine

## 2017-11-17 DIAGNOSIS — L669 Cicatricial alopecia, unspecified: Secondary | ICD-10-CM

## 2017-11-17 DIAGNOSIS — G4733 Obstructive sleep apnea (adult) (pediatric): Secondary | ICD-10-CM

## 2017-11-17 DIAGNOSIS — L930 Discoid lupus erythematosus: Secondary | ICD-10-CM | POA: Insufficient documentation

## 2017-11-17 DIAGNOSIS — M791 Myalgia, unspecified site: Secondary | ICD-10-CM

## 2017-11-17 DIAGNOSIS — L932 Other local lupus erythematosus: Secondary | ICD-10-CM

## 2017-11-17 DIAGNOSIS — M2550 Pain in unspecified joint: Secondary | ICD-10-CM

## 2017-11-17 NOTE — Telephone Encounter (Signed)
Additionally I called patient regarding her skin biopsy results.  Skin biopsy significant for cutaneous lupus.  Refer to rheumatology as patient has systemic signs and symptoms as well.

## 2017-11-17 NOTE — Telephone Encounter (Signed)
[facility] notes that you have not been using your CPAP and this is a break in therapy and insurance will often require (in your case it does) a repeat sleep study.  New home sleep study ordered.

## 2017-11-18 ENCOUNTER — Ambulatory Visit (INDEPENDENT_AMBULATORY_CARE_PROVIDER_SITE_OTHER): Payer: Federal, State, Local not specified - PPO

## 2017-11-18 DIAGNOSIS — M2550 Pain in unspecified joint: Secondary | ICD-10-CM | POA: Diagnosis not present

## 2017-11-18 DIAGNOSIS — R252 Cramp and spasm: Secondary | ICD-10-CM | POA: Diagnosis not present

## 2017-11-18 DIAGNOSIS — Z1383 Encounter for screening for respiratory disorder NEC: Secondary | ICD-10-CM | POA: Diagnosis not present

## 2017-11-18 DIAGNOSIS — R109 Unspecified abdominal pain: Secondary | ICD-10-CM | POA: Diagnosis not present

## 2017-11-18 DIAGNOSIS — R748 Abnormal levels of other serum enzymes: Secondary | ICD-10-CM | POA: Diagnosis not present

## 2017-11-18 DIAGNOSIS — M791 Myalgia, unspecified site: Secondary | ICD-10-CM | POA: Diagnosis not present

## 2017-11-18 LAB — RHEUMATOID FACTOR: Rhuematoid fact SerPl-aCnc: <14 IU/mL (ref <14)

## 2017-11-18 LAB — ANGIOTENSIN CONVERTING ENZYME: Angiotensin-Converting Enzyme: 127 U/L — ABNORMAL HIGH (ref 9–67)

## 2017-11-18 LAB — CYCLIC CITRUL PEPTIDE ANTIBODY, IGG: Cyclic Citrullin Peptide Ab: <16 UNITS

## 2017-11-18 LAB — SEDIMENTATION RATE: Sed Rate: 80 mm/h — ABNORMAL HIGH (ref 0–20)

## 2017-11-18 LAB — ANTI-NUCLEAR AB-TITER (ANA TITER): ANA Titer 1: 1:80 {titer} — ABNORMAL HIGH

## 2017-11-18 LAB — ANA: Anti Nuclear Antibody(ANA): POSITIVE — AB

## 2017-11-18 LAB — CK: CK TOTAL: 104 U/L (ref 29–143)

## 2017-11-18 NOTE — Telephone Encounter (Signed)
Spoke to patient gave her advise as noted below. Rhonda Cunningham,CMA

## 2017-11-19 LAB — VITAMIN D 25 HYDROXY (VIT D DEFICIENCY, FRACTURES): VIT D 25 HYDROXY: 31 ng/mL (ref 30–100)

## 2017-11-20 ENCOUNTER — Telehealth: Payer: Self-pay

## 2017-11-20 ENCOUNTER — Encounter: Payer: Self-pay | Admitting: Family Medicine

## 2017-11-20 NOTE — Telephone Encounter (Signed)
Letter written.  Ready for pickup.  Alternatively we can fax if patient knows the fax number.

## 2017-11-20 NOTE — Telephone Encounter (Signed)
Spoke to patient gave her results, she stated that her scalp is still sore but not itching, she wants to know if she get a letter to put her on light duty until she see rheumatology. Please advise. Nakyia Dau,CMA

## 2017-11-20 NOTE — Telephone Encounter (Signed)
Called patient and notified ready to pick up. KG LPN

## 2017-11-24 ENCOUNTER — Ambulatory Visit (INDEPENDENT_AMBULATORY_CARE_PROVIDER_SITE_OTHER): Payer: Federal, State, Local not specified - PPO | Admitting: Obstetrics & Gynecology

## 2017-11-24 ENCOUNTER — Encounter: Payer: Self-pay | Admitting: Obstetrics & Gynecology

## 2017-11-24 VITALS — BP 130/88 | Ht 65.0 in | Wt 278.0 lb

## 2017-11-24 DIAGNOSIS — Z3046 Encounter for surveillance of implantable subdermal contraceptive: Secondary | ICD-10-CM

## 2017-11-24 DIAGNOSIS — Z6841 Body Mass Index (BMI) 40.0 and over, adult: Secondary | ICD-10-CM | POA: Diagnosis not present

## 2017-11-24 DIAGNOSIS — Z01419 Encounter for gynecological examination (general) (routine) without abnormal findings: Secondary | ICD-10-CM | POA: Diagnosis not present

## 2017-11-24 DIAGNOSIS — E66813 Obesity, class 3: Secondary | ICD-10-CM

## 2017-11-24 NOTE — Addendum Note (Signed)
Addended by: Thurnell Garbe A on: 11/24/2017 04:32 PM   Modules accepted: Orders

## 2017-11-24 NOTE — Patient Instructions (Signed)
1. Encounter for routine gynecological examination with Papanicolaou smear of cervix Normal gynecologic exam.  Pap reflex done.  Breast exam normal.  Screening mammogram benign in May 2019.  Health labs with family physician.  Followed by rheumatology for recent diagnosis of SLE.  2. Encounter for surveillance of implantable subdermal contraceptive Well on Nexplanon since September 2017.  3. Class 3 severe obesity due to excess calories with serious comorbidity and body mass index (BMI) of 45.0 to 49.9 in adult [facility]) Body mass index 46.26.  Recommend low calorie/carb diet such as [employer].  Recommend increased aerobic activities to 5 times a week and weightlifting every 2 days.  Jennifer Erickson, it was such a pleasure seeing you today!  I will inform you of your results as soon as they are available.

## 2017-11-24 NOTE — Progress Notes (Signed)
Jennifer Erickson 18-May-1970 [id number]   History:    47 y.o. G0 Widowed x 06/2017.  Has 2 dogs. PHD in Psychology.  RP:  Established patient presenting for annual gyn exam   HPI: Well on Nexplanon since September 2017.  No breakthrough bleeding.  No pelvic pain.  Currently abstinent.  Normal vaginal secretions.  Urine and bowel movements normal.  Breasts normal.  Body mass index 46.26.  Intends to exercise more.  Health labs with family physician.  New diagnosis of systemic lupus erythematosus followed by rheumatology.  Past medical history,surgical history, family history and social history were all reviewed and documented in the EPIC chart.  Gynecologic History No LMP recorded. Patient has had an implant. Contraception: Nexplanon and x9/2017 Last Pap: 05/2015. Results were: Negative, HPV HR neg Last mammogram: 07/2017. Results were: Benign Bone Density: Never Colonoscopy: Never  Obstetric History OB History  Gravida Para Term Preterm AB Living  0 0 0 0 0 0  SAB TAB Ectopic Multiple Live Births  0 0 0 0 0     ROS: A ROS was performed and pertinent positives and negatives are included in the history.  GENERAL: No fevers or chills. HEENT: No change in vision, no earache, sore throat or sinus congestion. NECK: No pain or stiffness. CARDIOVASCULAR: No chest pain or pressure. No palpitations. PULMONARY: No shortness of breath, cough or wheeze. GASTROINTESTINAL: No abdominal pain, nausea, vomiting or diarrhea, melena or bright red blood per rectum. GENITOURINARY: No urinary frequency, urgency, hesitancy or dysuria. MUSCULOSKELETAL: No joint or muscle pain, no back pain, no recent trauma. DERMATOLOGIC: No rash, no itching, no lesions. ENDOCRINE: No polyuria, polydipsia, no heat or cold intolerance. No recent change in weight. HEMATOLOGICAL: No anemia or easy bruising or bleeding. NEUROLOGIC: No headache, seizures, numbness, tingling or weakness. PSYCHIATRIC: No depression, no loss of  interest in normal activity or change in sleep pattern.     Exam:   BP 130/88   Ht 5' 5"  (1.651 m)   Wt 278 lb (126.1 kg)   BMI 46.26 kg/m   Body mass index is 46.26 kg/m.  General appearance : Well developed well nourished female. No acute distress HEENT: Eyes: no retinal hemorrhage or exudates,  Neck supple, trachea midline, no carotid bruits, no thyroidmegaly Lungs: Clear to auscultation, no rhonchi or wheezes, or rib retractions  Heart: Regular rate and rhythm, no murmurs or gallops Breast:Examined in sitting and supine position were symmetrical in appearance, no palpable masses or tenderness,  no skin retraction, no nipple inversion, no nipple discharge, no skin discoloration, no axillary or supraclavicular lymphadenopathy Abdomen: no palpable masses or tenderness, no rebound or guarding Extremities: no edema or skin discoloration or tenderness  Pelvic: Vulva: Normal             Vagina: No gross lesions or discharge  Cervix: No gross lesions or discharge.  Pap reflex done  Uterus  AV, normal size, shape and consistency, non-tender and mobile  Adnexa  Without masses or tenderness  Anus: Normal   Assessment/Plan:  47 y.o. female for annual exam   1. Encounter for routine gynecological examination with Papanicolaou smear of cervix Normal gynecologic exam.  Pap reflex done.  Breast exam normal.  Screening mammogram benign in May 2019.  Health labs with family physician.  Followed by rheumatology for recent diagnosis of SLE.  2. Encounter for surveillance of implantable subdermal contraceptive Well on Nexplanon since September 2017.  3. Class 3 severe obesity due to excess calories  with serious comorbidity and body mass index (BMI) of 45.0 to 49.9 in adult [facility]) Body mass index 46.26.  Recommend low calorie/carb diet such as [employer].  Recommend increased aerobic activities to 5 times a week and weightlifting every 2 days.  Princess Bruins MD, 3:50 PM  11/24/2017

## 2017-11-25 LAB — PAP IG W/ RFLX HPV ASCU

## 2017-11-27 ENCOUNTER — Ambulatory Visit (INDEPENDENT_AMBULATORY_CARE_PROVIDER_SITE_OTHER): Payer: Federal, State, Local not specified - PPO | Admitting: Family Medicine

## 2017-11-27 ENCOUNTER — Encounter: Payer: Self-pay | Admitting: Family Medicine

## 2017-11-27 ENCOUNTER — Ambulatory Visit (INDEPENDENT_AMBULATORY_CARE_PROVIDER_SITE_OTHER): Payer: Federal, State, Local not specified - PPO

## 2017-11-27 ENCOUNTER — Encounter: Payer: Self-pay | Admitting: *Deleted

## 2017-11-27 VITALS — BP 155/87 | HR 97 | Ht 66.0 in | Wt 282.0 lb

## 2017-11-27 DIAGNOSIS — R109 Unspecified abdominal pain: Secondary | ICD-10-CM

## 2017-11-27 DIAGNOSIS — L932 Other local lupus erythematosus: Secondary | ICD-10-CM

## 2017-11-27 DIAGNOSIS — M545 Low back pain, unspecified: Secondary | ICD-10-CM

## 2017-11-27 DIAGNOSIS — L669 Cicatricial alopecia, unspecified: Secondary | ICD-10-CM | POA: Diagnosis not present

## 2017-11-27 NOTE — Patient Instructions (Signed)
Thank you for coming in today. Use a heating pad.  Attend PT Get xray now.   Give urine sample.   Let me know how Rheumatology visit goes.  I will prescribe prednisone if they dont.    TENS UNIT: This is helpful for muscle pain and spasm.   Search and Purchase a TENS 7000 2nd edition at  www.tenspros.com or www.[employer].com It should be less than $30.     TENS unit instructions: Do not shower or bathe with the unit on Turn the unit off before removing electrodes or batteries If the electrodes lose stickiness add a drop of water to the electrodes after they are disconnected from the unit and place on plastic sheet. If you continued to have difficulty, call the TENS unit company to purchase more electrodes. Do not apply lotion on the skin area prior to use. Make sure the skin is clean and dry as this will help prolong the life of the electrodes. After use, always check skin for unusual red areas, rash or other skin difficulties. If there are any skin problems, does not apply electrodes to the same area. Never remove the electrodes from the unit by pulling the wires. Do not use the TENS unit or electrodes other than as directed. Do not change electrode placement without consultating your therapist or physician. Keep 2 fingers with between each electrode. Wear time ratio is 2:1, on to off times.    For example on for 30 minutes off for 15 minutes and then on for 30 minutes off for 15 minutes

## 2017-11-27 NOTE — Progress Notes (Signed)
[name] is a 47 y.o. female who presents to [facility] today for follow-up lupus and discuss flank pain.  Jennifer "[name]" has been seen previously several times for initially scarring alopecia subsequently found to be likely lupus based on skin biopsy and rheumatologic work-up.  Patient was referred to rheumatology and has an appointment in 6 days.  She notes that her scalp lesions are much less itchy with topical high potency steroids.  She notes that she is more sensitive to the sun especially with scalp itching and fatigue.  She does note some joint pains and aches but notes that her majority of her pain is flank or back pain in nature.  She describes pain in her right back and flank and side.  She has a history of kidney stones causing pyelonephritis and sepsis.  She is not sure if she ever felt kidney stone pain and is not sure what that feels like.  She notes the pain in her current episode is been going on for about a month and does refer from the back to the right groin area.  Symptoms are worse with motion and better with rest.  She denies any radiating pain down her legs weakness or numbness.  She denies any urinary frequency urgency or dysuria or blood in the urine.  The pain is more consistent and more changed with motion.  She has tried some over-the-counter medicines for pain which have not helped much.   ROS as above:  Exam:  BP (!) 155/87   Pulse 97   Ht 5' 6"  (1.676 m)   Wt 282 lb (127.9 kg)   BMI 45.52 kg/m  Wt Readings from Last 5 Encounters:  11/27/17 282 lb (127.9 kg)  11/24/17 278 lb (126.1 kg)  11/13/17 276 lb (125.2 kg)  11/10/17 276 lb (125.2 kg)  11/02/17 279 lb (126.6 kg)    Gen: Well NAD HEENT: EOMI,  MMM Lungs: Normal work of breathing. CTABL Heart: RRR no MRG Abd: NABS, Soft. Nondistended, Nontender Exts: Brisk  capillary refill, warm and well perfused.  T and L-spine nontender to midline.  Tender palpation right thoracic and lumbar paraspinal muscle group. Lumbar motion decreased due to pain.  Pain with rotation and lateral flexion to the right. Lower extremity strength is intact.  Lab and Radiology Results X-ray lumbar spine images personally independently reviewed. Stable appearing left-sided kidney stones from x-ray from 2017 Possible right-sided pars defect at L2-3 No contralateral left-sided pars defect visible. No significant spinal listhesis. Mild degenerative changes at L5-S1 Await formal radiology review    Assessment and Plan: 48 y.o. female with  Flank pain: Differential is somewhat broad however think is more musculoskeletal in nature.  Based on x-ray results I am concerned for the possibility of a unilateral pars defect.  Await radiology over read however if concerning next step would be MRI or CT scan to confirm.  If no concern for pars with radiology over read proceed with physical therapy heating pad TENS unit.  Differential does also include urologic etiology including kidney stone and urine tract infection.  Urine culture is pending.  Differential also includes rheumatologic etiology.  Patient concern for lupus.  Recent labs did not show significant renal disease.  Patient has follow-up with rheumatology in the near future.  Anticipate benefit with steroid Dosepak however like to avoid using steroids right now as this may mask her symptoms or labs at the rheumatologist  office next week.   Orders Placed This Encounter  Procedures  . Urine Culture  . DG Lumbar Spine Complete    Standing Status:   Future    Number of Occurrences:   1    Standing Expiration Date:   01/28/2019    Order Specific Question:   Reason for Exam (SYMPTOM  OR DIAGNOSIS REQUIRED)    Answer:   eval right low back and flank pain    Order Specific Question:   Is patient pregnant?    Answer:   No     Order Specific Question:   Preferred imaging location?    Answer:   [facility]    Order Specific Question:   Radiology Contrast Protocol - do NOT remove file path    Answer:   [email or web address]  . Ambulatory referral to Physical Therapy    Referral Priority:   Routine    Referral Type:   Physical Medicine    Referral Reason:   Specialty Services Required    Requested Specialty:   Physical Therapy   No orders of the defined types were placed in this encounter.    Historical information moved to improve visibility of documentation.  Past Medical History:  Diagnosis Date  . Lupus ([place])   . Personal history of amenorrhea   . Sinusitis    Past Surgical History:  Procedure Laterality Date  . DILATION AND CURETTAGE OF UTERUS    . TONSILLECTOMY     Social History   Tobacco Use  . Smoking status: Never Smoker  . Smokeless tobacco: Never Used  Substance Use Topics  . Alcohol use: No   family history includes Cancer in her mother; Diabetes in her brother; Hypertension in her father.  Medications: Current Outpatient Medications  Medication Sig Dispense Refill  . AMBULATORY NON FORMULARY MEDICATION Continuous positive airway pressure (CPAP) machine auto-titrate from 4-20 cm of H2O pressure, with all supplemental supplies as needed. Patient previously managed with [facility] Send 30 day report to Dr Georgina Snell [phone] after 30 days 1 each 0  . EPINEPHrine (EPIPEN 2-PAK) 0.3 mg/0.3 mL IJ SOAJ injection INJECT 0.3 MLS (0.3 MG TOTAL) INTO THE MUSCLE ONCE. 1 Device 4  . etonogestrel (IMPLANON) 68 MG IMPL implant Inject 1 each into the skin once.    . fluocinonide (LIDEX) 0.05 % external solution Apply 1 application topically 2 (two) times daily. 60 mL 12   No current facility-administered medications for this visit.    Allergies  Allergen Reactions  . Aspirin Other (See Comments)    GI issues  . Augmentin [Amoxicillin-Pot  Clavulanate] Diarrhea  . Ciprofloxacin     Nausea and vomiting.   [name] Hydrocodone     Cough syrup/nausea  . Ivp Dye [Iodinated Diagnostic Agents]      Discussed warning signs or symptoms. Please see discharge instructions. Patient expresses understanding.

## 2017-11-29 LAB — URINE CULTURE
MICRO NUMBER: 91041710
SPECIMEN QUALITY: ADEQUATE

## 2017-12-01 MED ORDER — CEPHALEXIN 500 MG PO CAPS: 500.0000 mg | ORAL_CAPSULE | Freq: Two times a day (BID) | ORAL | 0 refills | Status: DC

## 2017-12-01 MED FILL — CEPHALEXIN 500 MG CAPSULE: 500 | 7 days supply | Qty: 14 | Fill #0

## 2017-12-01 NOTE — Addendum Note (Signed)
Addended by: Gregor Hams on: 12/01/2017 07:06 AM   Modules accepted: Orders

## 2017-12-03 DIAGNOSIS — L930 Discoid lupus erythematosus: Secondary | ICD-10-CM | POA: Diagnosis not present

## 2017-12-03 DIAGNOSIS — R768 Other specified abnormal immunological findings in serum: Secondary | ICD-10-CM | POA: Diagnosis not present

## 2017-12-03 DIAGNOSIS — M2550 Pain in unspecified joint: Secondary | ICD-10-CM | POA: Diagnosis not present

## 2017-12-03 MED FILL — HYDROXYCHLOROQUINE SULFATE: 200 | 30 days supply | Qty: 60 | Fill #0

## 2017-12-09 MED FILL — predniSONE 5 MG TABS: 5 | 12 days supply | Qty: 48 | Fill #0

## 2017-12-10 ENCOUNTER — Ambulatory Visit (INDEPENDENT_AMBULATORY_CARE_PROVIDER_SITE_OTHER): Payer: Federal, State, Local not specified - PPO | Admitting: Rehabilitative and Restorative Service Providers"

## 2017-12-10 ENCOUNTER — Encounter: Payer: Self-pay | Admitting: Rehabilitative and Restorative Service Providers"

## 2017-12-10 DIAGNOSIS — M25672 Stiffness of left ankle, not elsewhere classified: Secondary | ICD-10-CM | POA: Diagnosis not present

## 2017-12-10 DIAGNOSIS — R29898 Other symptoms and signs involving the musculoskeletal system: Secondary | ICD-10-CM | POA: Diagnosis not present

## 2017-12-10 DIAGNOSIS — M545 Low back pain, unspecified: Secondary | ICD-10-CM

## 2017-12-10 NOTE — Therapy (Signed)
[facility] [place], [place], [address] Phone: [phone]   Fax:  [phone]  Physical Therapy Evaluation  Patient Details  Name: Jennifer Erickson MRN: [id number] Date of Birth: [birth year] Referring Provider: Dr Lynne Leader    Encounter Date: 12/10/2017  PT End of Session - 12/10/17 1107    Visit Number  1    Number of Visits  12    Date for PT Re-Evaluation  01/21/18    PT Start Time  1106    PT Stop Time  1200    PT Time Calculation (min)  54 min    Activity Tolerance  Patient tolerated treatment well       Past Medical History:  Diagnosis Date  . Lupus ([place])   . Personal history of amenorrhea   . Sinusitis     Past Surgical History:  Procedure Laterality Date  . DILATION AND CURETTAGE OF UTERUS    . TONSILLECTOMY      There were no vitals filed for this visit.   Subjective Assessment - 12/10/17 1111    Subjective  Patient reports that she started having Rt sided LBP ~ 5-6 weeks ago which she thought was related to UTI. She has been on medication for infection which has decreased the LBP. She continues to have Rt sided LBP.     Pertinent History  similar pain ~ 3 yrs ago when she had UTI/kidney infections; intermittent LBP for a few yrs     Diagnostic tests  xrays     Patient Stated Goals  get of the rest of the LBP     Currently in Pain?  No/denies    Pain Score  0-No pain    Pain Location  Back    Pain Orientation  Right;Lower    Pain Descriptors / Indicators  Aching    Pain Type  Acute pain    Pain Radiating Towards  sometimes nto the Rt posterior hip     Pain Onset  More than a month ago    Pain Frequency  Intermittent    Aggravating Factors   infection; increased activity    Pain Relieving Factors  [place] for pain management          [facility] PT Assessment - 12/10/17 0001      Assessment   Medical Diagnosis  Rt sided LBP     Referring Provider  Dr Lynne Leader     Onset  Date/Surgical Date  10/29/17    Hand Dominance  Right    Next MD Visit  none scheduled     Prior Therapy  yes for knee x 2 visits       Precautions   Precautions  None      Balance Screen   Has the patient fallen in the past 6 months  No    Has the patient had a decrease in activity level because of a fear of falling?   No    Is the patient reluctant to leave their home because of a fear of falling?   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     Leisure  yard work; household chorews/activities       Observation/Other Assessments   Focus on Therapeutic Outcomes (FOTO)   47% limitation  Sensation   Additional Comments  WFL's per pt report       Posture/Postural Control   Posture Comments  head forward; shoulders rounded       AROM   Overall AROM Comments  hip mobility limited with hip rotation and extension     AROM Assessment Site  --   denies pain with trunk ROM    Lumbar Flexion  80%    Lumbar Extension  55%    Lumbar - Right Side Bend  70%    Lumbar - Left Side Bend  70%    Lumbar - Right Rotation  40%    Lumbar - Left Rotation  40%      Strength   Overall Strength Comments  5/5 WNL's bilat LE's       Flexibility   Hamstrings  tight ~ 75 deg     Quadriceps  tight bilat     ITB  tight Rt > Lt     Piriformis  tight bilat Lt > Rt       Palpation   Spinal mobility  pain with CPA mobs lumbar spine     Palpation comment  tender to palpation hip flexors bilat; Rt lumbar paraspinals/QL/lats                 Objective measurements completed on examination: See above findings.      [facility] PT Treatment/Exercise - 12/10/17 0001      Self-Care   Self-Care  --   initiated back care      Lumbar Exercises: Stretches   Passive Hamstring Stretch  Right;Left;2 reps;30 seconds   supine with strap    Hip Flexor Stretch   Right;Left;2 reps;30 seconds   seated    Quad Stretch  Right;Left;2 reps;30 seconds   prone with strap    ITB Stretch  Right;Left;2 reps;30 seconds   supine with strap      Moist Heat Therapy   Number Minutes Moist Heat  20 Minutes    Moist Heat Location  Lumbar Spine      Electrical Stimulation   Electrical Stimulation Location  Rt lumbar     Electrical Stimulation Action  IFC    Electrical Stimulation Parameters  to tolerance    Electrical Stimulation Goals  Pain;Tone             PT Education - 12/10/17 1139    Education Details  HEP TENS     Person(s) Educated  Patient    Methods  Explanation;Demonstration;Tactile cues;Verbal cues;Handout    Comprehension  Verbalized understanding;Returned demonstration;Verbal cues required;Tactile cues required          PT Long Term Goals - 12/10/17 1333      PT LONG TERM GOAL #1   Title  Improve trunk and LE mobility and ROM 01/20/18    Time  6    Period  Weeks    Status  New      PT LONG TERM GOAL #2   Title  Patient reports increased sitting tolerance for sitting in the car for travel to 30-45 min 01/20/18    Time  6    Period  Weeks    Status  New      PT LONG TERM GOAL #3   Title  Patient reports decreased pain in the Rt lumbar area allowing her to participate in normal functional and work activities without pain 01/20/18    Time  6    Period  Weeks  Status  New      PT LONG TERM GOAL #4   Title  Independent in HEP 01/20/18    Time  6    Period  Weeks    Status  New      PT LONG TERM GOAL #5   Title  improve FOTO =/< 31% limited 01/20/18    Time  6    Period  Weeks    Status  New             Plan - 12/10/17 1242    Clinical Impression Statement  [name] presents with resolving Rt sided low back pain. Patient has limited trunk and LE mobilty; decreased tolerance to sitting and functional activiies; tenderness to palpation through Rt side through the posterior lumbar area. She will benefit from PT  to address problems identified.     History and Personal Factors relevant to plan of care:  recurrent LBP     Clinical Presentation  Stable    Clinical Decision Making  Low    Rehab Potential  Good    PT Frequency  2x / week    PT Duration  6 weeks    PT Treatment/Interventions  Patient/family education;ADLs/Self Care Home Management;Cryotherapy;Electrical Stimulation;Iontophoresis 65m/ml Dexamethasone;Moist Heat;Ultrasound;Dry needling;Manual techniques;Neuromuscular re-education;Therapeutic activities;Therapeutic exercise    PT Next Visit Plan  review HEP; progress with flexibility exercises for hips and spine; core stabilization; modalities as indicated     Consulted and Agree with Plan of Care  Patient       Patient will benefit from skilled therapeutic intervention in order to improve the following deficits and impairments:  Postural dysfunction, Improper body mechanics, Pain, Increased fascial restricitons, Increased muscle spasms, Decreased mobility, Decreased range of motion, Decreased activity tolerance  Visit Diagnosis: Acute right-sided low back pain without sciatica - Plan: PT plan of care cert/re-cert  Other symptoms and signs involving the musculoskeletal system - Plan: PT plan of care cert/re-cert  Stiffness of left ankle, not elsewhere classified - Plan: PT plan of care cert/re-cert     Problem List Patient Active Problem List   Diagnosis Date Noted  . 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 Elevated blood pressure reading 09/24/2017  . Coughing 07/03/2017  . Grief reaction 12/26/2016  . Lateral epicondylitis of both elbows 12/03/2016  . Bilateral shoulder pain 12/03/2016  . Toe fracture, right 04/15/2016  . Polyarthralgia 02/18/2016  . Cramps of left lower extremity 11/28/2015  . 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 Palpitations 07/11/2015  . OSA on CPAP 07/10/2015  . Large breasts 05/28/2015  . Primary osteoarthritis of both  knees 12/08/2014  . Flank pain 11/01/2014  . Vitamin D deficiency 04/19/2014  . Left lumbar radiculitis 01/25/2014  . Bilateral swelling of feet 05/02/2013  . Left breast mass 11/22/2012  . Obstructive sleep apnea 11/22/2012  . Plantar fasciitis, bilateral 11/01/2012  . Uvular swelling 12/30/2011  . Insomnia 12/30/2011  . Obesity, Class III, BMI 40-49.9 (morbid obesity) ([place] 08/19/2011    Flynt Breeze PNilda SimmerPT, MPH  12/10/2017, 1:43 PM  [facility] [address]Phone: [phone]  Fax:  [phone] Name: TSTEELE LEDONNEMRN: [id number] of Birth: 819-Aug-1972

## 2017-12-10 NOTE — Patient Instructions (Signed)
HIP: Hamstrings - Supine  Place strap around foot. Raise leg up, keeping knee straight.  Bend opposite knee to protect back if indicated. Hold 30 seconds. 3 reps per set, 2-3 sets per day  Outer Hip Stretch: Reclined IT Band Stretch (Strap)   Strap around one foot, pull leg across body until you feel a pull or stretch in the outside of your hip, with shoulders on mat. Hold for 30 seconds. Repeat 3 times each leg. 2-3 times/day.    Quads / HF, Prone KNEE: Quadriceps - Prone    Place strap around ankle. Bring ankle toward buttocks. Press hip into surface. Hold 30 seconds. Repeat 3 times per session. Do 2-3 sessions per day.    TENS UNIT: This is helpful for muscle pain and spasm.   Search and Purchase a TENS 7000 2nd edition at www.tenspros.com. It should be less than $30.     TENS unit instructions: Do not shower or bathe with the unit on Turn the unit off before removing electrodes or batteries If the electrodes lose stickiness add a drop of water to the electrodes after they are disconnected from the unit and place on plastic sheet. If you continued to have difficulty, call the TENS unit company to purchase more electrodes. Do not apply lotion on the skin area prior to use. Make sure the skin is clean and dry as this will help prolong the life of the electrodes. After use, always check skin for unusual red areas, rash or other skin difficulties. If there are any skin problems, does not apply electrodes to the same area. Never remove the electrodes from the unit by pulling the wires. Do not use the TENS unit or electrodes other than as directed. Do not change electrode placement without consultating your therapist or physician. Keep 2 fingers with between each electrode.   Sleeping on Back  Place pillow under knees. A pillow with cervical support and a roll around waist are also helpful. Copyright  VHI. All rights reserved.  Sleeping on Side Place pillow between knees.  Use cervical support under neck and a roll around waist as needed. Copyright  VHI. All rights reserved.   Sleeping on Stomach   If this is the only desirable sleeping position, place pillow under lower legs, and under stomach or chest as needed.  Posture - Sitting   Sit upright, head facing forward. Try using a roll to support lower back. Keep shoulders relaxed, and avoid rounded back. Keep hips level with knees. Avoid crossing legs for long periods. Stand to Sit / Sit to Stand   To sit: Bend knees to lower self onto front edge of chair, then scoot back on seat. To stand: Reverse sequence by placing one foot forward, and scoot to front of seat. Use rocking motion to stand up.   Work Height and Reach  Ideal work height is no more than 2 to 4 inches below elbow level when standing, and at elbow level when sitting. Reaching should be limited to arm's length, with elbows slightly bent.  Bending  Bend at hips and knees, not back. Keep feet shoulder-width apart.    Posture - Standing   Good posture is important. Avoid slouching and forward head thrust. Maintain curve in low back and align ears over shoul- ders, hips over ankles.  Alternating Positions   Alternate tasks and change positions frequently to reduce fatigue and muscle tension. Take rest breaks. Computer Work   Position work to Programmer, multimedia. Use proper work  and seat height. Keep shoulders back and down, wrists straight, and elbows at right angles. Use chair that provides full back support. Add footrest and lumbar roll as needed.  Getting Into / Out of Car  Lower self onto seat, scoot back, then bring in one leg at a time. Reverse sequence to get out.  Dressing  Lie on back to pull socks or slacks over feet, or sit and bend leg while keeping back straight.    Housework - Sink  Place one foot on ledge of cabinet under sink when standing at sink for prolonged periods.   Pushing / Pulling  Pushing is  preferable to pulling. Keep back in proper alignment, and use leg muscles to do the work.  Deep Squat   Squat and lift with both arms held against upper trunk. Tighten stomach muscles without holding breath. Use smooth movements to avoid jerking.  Avoid Twisting   Avoid twisting or bending back. Pivot around using foot movements, and bend at knees if needed when reaching for articles.  Carrying Luggage   Distribute weight evenly on both sides. Use a cart whenever possible. Do not twist trunk. Move body as a unit.   Lifting Principles .Maintain proper posture and head alignment. .Slide object as close as possible before lifting. .Move obstacles out of the way. .Test before lifting; ask for help if too heavy. .Tighten stomach muscles without holding breath. .Use smooth movements; do not jerk. .Use legs to do the work, and pivot with feet. .Distribute the work load symmetrically and close to the center of trunk. .Push instead of pull whenever possible.   Ask For Help   Ask for help and delegate to others when possible. Coordinate your movements when lifting together, and maintain the low back curve.  Log Roll   Lying on back, bend left knee and place left arm across chest. Roll all in one movement to the right. Reverse to roll to the left. Always move as one unit. Housework - Sweeping  Use long-handled equipment to avoid stooping.   Housework - Wiping  Position yourself as close as possible to reach work surface. Avoid straining your back.  Laundry - Unloading Wash   To unload small items at bottom of washer, lift leg opposite to arm being used to reach.  [place] close to area to be raked. Use arm movements to do the work. Keep back straight and avoid twisting.     Cart  When reaching into cart with one arm, lift opposite leg to keep back straight.   Getting Into / Out of Bed  Lower self to lie down on one side by raising legs and lowering  head at the same time. Use arms to assist moving without twisting. Bend both knees to roll onto back if desired. To sit up, start from lying on side, and use same move-ments in reverse. Housework - Vacuuming  Hold the vacuum with arm held at side. Step back and forth to move it, keeping head up. Avoid twisting.   Laundry - IT consultant so that bending and twisting can be avoided.   Laundry - Unloading Dryer  Squat down to reach into clothes dryer or use a reacher.  Gardening - Weeding / Probation officer or Kneel. Knee pads may be helpful.

## 2017-12-11 MED FILL — FLUOCINONIDE 0.05 % SOLN: 0.05 | 30 days supply | Qty: 60 | Fill #1

## 2017-12-15 ENCOUNTER — Telehealth: Payer: Self-pay | Admitting: Physical Therapy

## 2017-12-15 ENCOUNTER — Encounter: Payer: Federal, State, Local not specified - PPO | Admitting: Physical Therapy

## 2017-12-15 NOTE — Telephone Encounter (Signed)
Called patient regarding missed therapy appointment.  Patient stated she was asleep and had forgotten she had an appointment.  Confirmed next scheduled appt with her.  Reminded her to call office if she is unable to attend.  Patient voice understanding and agreed.    Kerin Perna, PTA 12/15/17 11:59 AM

## 2017-12-17 ENCOUNTER — Telehealth: Payer: Self-pay | Admitting: Family Medicine

## 2017-12-17 NOTE — Telephone Encounter (Signed)
Medical records received from [facility] rheumatology.  Patient had initial consultation for lupus and was prescribed Plaquenil.  Labs show low serum C4 complement. This will be sent to scan and to abstract.  Copy will be given to PCP.  Patient should transition care back to PCP.

## 2017-12-18 ENCOUNTER — Ambulatory Visit: Payer: Federal, State, Local not specified - PPO | Admitting: Physical Therapy

## 2017-12-18 DIAGNOSIS — M545 Low back pain, unspecified: Secondary | ICD-10-CM

## 2017-12-18 DIAGNOSIS — R29898 Other symptoms and signs involving the musculoskeletal system: Secondary | ICD-10-CM | POA: Diagnosis not present

## 2017-12-18 NOTE — Therapy (Addendum)
[place], [place], [address] Phone: [phone]   Fax:  [phone]  Physical Therapy Treatment  Patient Details  Name: VERNISHA BACOTE MRN: [id number] Date of Birth: 1970-08-17 Referring Provider: Dr. Lynne Leader    Encounter Date: 12/18/2017  PT End of Session - 12/18/17 1109    Visit Number  2    Number of Visits  12    Date for PT Re-Evaluation  01/21/18    PT Start Time  1106    PT Stop Time  1148    PT Time Calculation (min)  42 min    Activity Tolerance  Patient tolerated treatment well;No increased pain    Behavior During Therapy  WFL for tasks assessed/performed       Past Medical History:  Diagnosis Date  . Lupus ([place])   . Personal history of amenorrhea   . Sinusitis     Past Surgical History:  Procedure Laterality Date  . DILATION AND CURETTAGE OF UTERUS    . TONSILLECTOMY      There were no vitals filed for this visit.  Subjective Assessment - 12/18/17 1109    Subjective  Four nights ago, her back hurt so bad that she had to use heating pad, took tylenol and put on quell device (pain up to 10/10); resolved with measures taken.  She thinks she may have just overdone it that day.  UTI treated and her back does feel some better.   She hasn't been doing the stretch but has been walking. This has helped reduce stiffness.   She is still on light duty until 10/3.      Patient Stated Goals  get of the rest of the LBP     Currently in Pain?  No/denies         [facility] PT Assessment - 12/18/17 0001      Assessment   Medical Diagnosis  Rt sided LBP     Referring Provider  Dr. Lynne Leader     Onset Date/Surgical Date  10/29/17    Hand Dominance  Right    Next MD Visit  PRN       [facility] Adult PT Treatment/Exercise - 12/18/17 0001      Exercises   Exercises  Knee/Hip;Lumbar      Lumbar Exercises: Stretches   Passive Hamstring Stretch  Right;Left;2 reps;30 seconds   seated    Lower Trunk Rotation  4 reps;10 seconds    Hip Flexor Stretch  Right;Left;2 reps;30 seconds   seated    Quad Stretch  Right;Left;2 reps;30 seconds   seated, foot under chair   Piriformis Stretch  Right;Left;30 seconds;3 reps    Gastroc Stretch  Right;Left;2 reps;30 seconds      Lumbar Exercises: Aerobic   Nustep  L4: 5.5 min       Lumbar Exercises: Supine   Ab Set  5 reps;5 seconds    Bridge  5 reps   required encouragement to complete     Modalities   Modalities  --   pt declined            PT Education - 12/18/17 1146    Education Details  HEP :  access code [email or web address]     Person(s) Educated  Patient    Methods  Explanation;Handout;Demonstration    Comprehension  Verbalized understanding;Returned demonstration          PT Long Term Goals - 12/10/17 1333      PT  LONG TERM GOAL #1   Title  Improve trunk and LE mobility and ROM 01/20/18    Time  6    Period  Weeks    Status  New      PT LONG TERM GOAL #2   Title  Patient reports increased sitting tolerance for sitting in the car for travel to 30-45 min 01/20/18    Time  6    Period  Weeks    Status  New      PT LONG TERM GOAL #3   Title  Patient reports decreased pain in the Rt lumbar area allowing her to participate in normal functional and work activities without pain 01/20/18    Time  6    Period  Weeks    Status  New      PT LONG TERM GOAL #4   Title  Independent in HEP 01/20/18    Time  6    Period  Weeks    Status  New      PT LONG TERM GOAL #5   Title  improve FOTO =/< 31% limited 01/20/18    Time  6    Period  Weeks    Status  New            Plan - 12/18/17 1124    Clinical Impression Statement  Pt's low back pain has calmed down.  Instructed pt in LE stretches (alternatives to last session). She tolerated all exercises well without increase in back, but mild knee soreness.  Pt declined modalities as she felt good at end of session.  Encouraged pt to continue walking program and  stretches to prepare for return to work.      Rehab Potential  Good    PT Frequency  2x / week    PT Duration  6 weeks    PT Treatment/Interventions  Patient/family education;ADLs/Self Care Home Management;Cryotherapy;Electrical Stimulation;Iontophoresis 24m/ml Dexamethasone;Moist Heat;Ultrasound;Dry needling;Manual techniques;Neuromuscular re-education;Therapeutic activities;Therapeutic exercise    PT Next Visit Plan  continue progressive flexibility exercises for hips and spine; core stabilization; modalities as indicated     Consulted and Agree with Plan of Care  Patient       Patient will benefit from skilled therapeutic intervention in order to improve the following deficits and impairments:  Postural dysfunction, Improper body mechanics, Pain, Increased fascial restricitons, Increased muscle spasms, Decreased mobility, Decreased range of motion, Decreased activity tolerance  Visit Diagnosis: Acute right-sided low back pain without sciatica  Other symptoms and signs involving the musculoskeletal system     Problem List Patient Active Problem List   Diagnosis Date Noted  . 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 Elevated blood pressure reading 09/24/2017  . Coughing 07/03/2017  . Grief reaction 12/26/2016  . Lateral epicondylitis of both elbows 12/03/2016  . Bilateral shoulder pain 12/03/2016  . Toe fracture, right 04/15/2016  . Polyarthralgia 02/18/2016  . Cramps of left lower extremity 11/28/2015  . 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 Palpitations 07/11/2015  . OSA on CPAP 07/10/2015  . Large breasts 05/28/2015  . Primary osteoarthritis of both knees 12/08/2014  . Flank pain 11/01/2014  . Vitamin D deficiency 04/19/2014  . Left lumbar radiculitis 01/25/2014  . Bilateral swelling of feet 05/02/2013  . Left breast mass 11/22/2012  . Obstructive sleep apnea 11/22/2012  . Plantar fasciitis, bilateral 11/01/2012  . Uvular swelling  12/30/2011  . Insomnia 12/30/2011  . Obesity, Class III, BMI 40-49.9 (morbid obesity) ([place] 08/19/2011  Kerin Perna, PTA 12/18/17 12:08 PM  [place], [place], [address] Phone: [phone]   Fax:  [phone]  Name: MORIA BROPHY MRN: [id number] Date of Birth: 29-Dec-1970  PHYSICAL THERAPY DISCHARGE SUMMARY  Visits from Start of Care: 2  Current functional level related to goals / functional outcomes: See last progress note for discharge status    Remaining deficits: Unknown    Education / Equipment: HEP  Plan: Patient agrees to discharge.  Patient goals were not met. Patient is being discharged due to not returning since the last visit.  ?????     Celyn P. Helene Kelp PT, MPH 01/29/18 9:18 AM

## 2017-12-22 ENCOUNTER — Encounter: Payer: Self-pay | Admitting: Physical Therapy

## 2018-01-08 ENCOUNTER — Encounter: Payer: Self-pay | Admitting: Physician Assistant

## 2018-01-08 LAB — C4 COMPLEMENT
ANA Titer 1: NEGATIVE
C4 COMPLEMENT: 9
COMPL TOTAL (CH50): 53
COMPLEMENT C3, SERUM: 205
CRP MG/L: 10
Creatinine, Ur: 238.2 mg/dL
Protein Creatinine Ratio: 82
RNP Antibodies: 2.9
Smith Antibodies: 0.4
Total Protein: 19.5
ss DNA Ab: <1

## 2018-01-08 NOTE — Progress Notes (Signed)
Call pt: autoimmune work up negative.

## 2018-01-12 ENCOUNTER — Encounter: Payer: Self-pay | Admitting: Family Medicine

## 2018-01-12 ENCOUNTER — Ambulatory Visit (INDEPENDENT_AMBULATORY_CARE_PROVIDER_SITE_OTHER): Payer: Federal, State, Local not specified - PPO | Admitting: Family Medicine

## 2018-01-12 VITALS — BP 139/81 | HR 90 | Ht 66.0 in | Wt 284.0 lb

## 2018-01-12 DIAGNOSIS — L932 Other local lupus erythematosus: Secondary | ICD-10-CM | POA: Diagnosis not present

## 2018-01-12 DIAGNOSIS — Z23 Encounter for immunization: Secondary | ICD-10-CM | POA: Diagnosis not present

## 2018-01-12 DIAGNOSIS — L669 Cicatricial alopecia, unspecified: Secondary | ICD-10-CM

## 2018-01-12 NOTE — Progress Notes (Signed)
Jennifer Erickson is a 47 y.o. female who presents to [place] [place] today for follow-up back pain, and lupus.  Jennifer Erickson has a history of cutaneous lupus diagnosed via skin biopsy causing scalp irritation and alopecia.  Additionally she had some systemic symptoms including body aches and joint pain and fatigue.  She has been seen by rheumatology who did diagnose her with lupus and started her on Plaquenil.  She notes that she is been feeling a lot better on Plaquenil.  She notes less body aches and pains and notes that her scalp irritation is improved.  Her alopecia has not resolved however.  She is worried about the long-term side effects of lupus and is worried about the risk first benefits.  She feels quite well on lupus and denies significant side effects.  She notes last week she had an episode of diarrhea and abdominal cramping but she thinks that was likely a viral stomach infection.  The stomach cramping has almost completely resolved.   Additionally she was seen for back pain and has had significant improvement with physical therapy and is effectively pain-free.   ROS as above:  Exam:  BP 139/81   Pulse 90   Ht 5' 6"  (1.676 m)   Wt 284 lb (128.8 kg)   BMI 45.84 kg/m  Wt Readings from Last 5 Encounters:  01/12/18 284 lb (128.8 kg)  11/27/17 282 lb (127.9 kg)  11/24/17 278 lb (126.1 kg)  11/13/17 276 lb (125.2 kg)  11/10/17 276 lb (125.2 kg)    Gen: Well NAD HEENT: EOMI,  MMM Lungs: Normal work of breathing. CTABL Heart: RRR no MRG Abd: NABS, Soft. Nondistended, Nontender Exts: Brisk capillary refill, warm and well perfused.   Lab and Radiology Results No results found for this or any previous visit (from the past 72 hour(s)). No results found.    Assessment and Plan: 47 y.o. female with  Cutaneous lupus.  Significant improvement with Plaquenil.   Long discussion about risks versus benefits of this medication and the likelihood that overall it is less risky to be on Plaquenil than off of it.  Recommend continuing Plaquenil as she is feeling better on it and following up with rheumatology as scheduled in December.  Back pain resolved.  Watchful waiting continue home exercise program.  I spent 15 minutes with this patient, greater than 50% was face-to-face time counseling regarding ddx and plan.  Flu vaccine given today prior to discharge.  Orders Placed This Encounter  Procedures  . Flu Vaccine QUAD 36+ mos IM   No orders of the defined types were placed in this encounter.    Historical information moved to improve visibility of documentation.  Past Medical History:  Diagnosis Date  . Lupus ([place])   . Personal history of amenorrhea   . Sinusitis    Past Surgical History:  Procedure Laterality Date  . DILATION AND CURETTAGE OF UTERUS    . TONSILLECTOMY     Social History   Tobacco Use  . Smoking status: Never Smoker  . Smokeless tobacco: Never Used  Substance Use Topics  . Alcohol use: No   family history includes Cancer in her mother; Diabetes in her brother; Hypertension in her father.  Medications: Current Outpatient Medications  Medication Sig Dispense Refill  . AMBULATORY NON FORMULARY MEDICATION Continuous positive airway pressure (CPAP) machine auto-titrate from 4-20 cm of H2O pressure, with all supplemental supplies as needed. Patient previously managed  with [facility] Send 30 day report to Dr Georgina Snell [phone] after 30 days 1 each 0  . EPINEPHrine (EPIPEN 2-PAK) 0.3 mg/0.3 mL IJ SOAJ injection INJECT 0.3 MLS (0.3 MG TOTAL) INTO THE MUSCLE ONCE. 1 Device 4  . etonogestrel (IMPLANON) 68 MG IMPL implant Inject 1 each into the skin once.    . fluocinonide (LIDEX) 0.05 % external solution Apply 1 application topically 2 (two) times daily. 60 mL 12  . hydroxychloroquine (PLAQUENIL) 200 MG tablet Take 200  mg by mouth 2 (two) times daily.  3   No current facility-administered medications for this visit.    Allergies  Allergen Reactions  . Aspirin Other (See Comments)    GI issues  . Augmentin [Amoxicillin-Pot Clavulanate] Diarrhea  . Ciprofloxacin     Nausea and vomiting.   [name] Hydrocodone     Cough syrup/nausea  . Ivp Dye [Iodinated Diagnostic Agents]      Discussed warning signs or symptoms. Please see discharge instructions. Patient expresses understanding.

## 2018-01-12 NOTE — Patient Instructions (Addendum)
Thank you for coming in today. Recheck with rheumatology as scheduled.  Let me know if you have any questions.  Please make sure that the dermatologist send records to [place].  Recheck with me and Luvenia Starch in the near future.   Hydroxychloroquine tablets What is this medicine? HYDROXYCHLOROQUINE (hye drox ee KLOR oh kwin) is used to treat rheumatoid arthritis and systemic lupus erythematosus. It is also used to treat malaria. This medicine may be used for other purposes; ask your health care provider or pharmacist if you have questions. COMMON BRAND NAME(S): Plaquenil, Quineprox What should I tell my health care provider before I take this medicine? They need to know if you have any of these conditions: -diabetes -eye disease, vision problems -G6PD deficiency -history of blood diseases -history of irregular heartbeat -if you often drink alcohol -kidney disease -liver disease -porphyria -psoriasis -seizures -an unusual or allergic reaction to chloroquine, hydroxychloroquine, other medicines, foods, dyes, or preservatives -pregnant or trying to get pregnant -breast-feeding How should I use this medicine? Take this medicine by mouth with a glass of water. Follow the directions on the prescription label. Avoid taking antacids within 4 hours of taking this medicine. It is best to separate these medicines by at least 4 hours. Do not cut, crush or chew this medicine. You can take it with or without food. If it upsets your stomach, take it with food. Take your medicine at regular intervals. Do not take your medicine more often than directed. Take all of your medicine as directed even if you think you are better. Do not skip doses or stop your medicine early. Talk to your pediatrician regarding the use of this medicine in children. While this drug may be prescribed for selected conditions, precautions do apply. Overdosage: If you think you have taken too much of this medicine contact a poison control  center or emergency room at once. NOTE: This medicine is only for you. Do not share this medicine with others. What if I miss a dose? If you miss a dose, take it as soon as you can. If it is almost time for your next dose, take only that dose. Do not take double or extra doses. What may interact with this medicine? Do not take this medicine with any of the following medications: -cisapride -dofetilide -dronedarone -live virus vaccines -penicillamine -pimozide -thioridazine -ziprasidone This medicine may also interact with the following medications: -ampicillin -antacids -cimetidine -cyclosporine -digoxin -medicines for diabetes, like insulin, glipizide, glyburide -medicines for seizures like carbamazepine, phenobarbital, phenytoin -mefloquine -methotrexate -other medicines that prolong the QT interval (cause an abnormal heart rhythm) -praziquantel This list may not describe all possible interactions. Give your health care provider a list of all the medicines, herbs, non-prescription drugs, or dietary supplements you use. Also tell them if you smoke, drink alcohol, or use illegal drugs. Some items may interact with your medicine. What should I watch for while using this medicine? Tell your doctor or healthcare professional if your symptoms do not start to get better or if they get worse. Avoid taking antacids within 4 hours of taking this medicine. It is best to separate these medicines by at least 4 hours. Tell your doctor or health care professional right away if you have any change in your eyesight. Your vision and blood may be tested before and during use of this medicine. This medicine can make you more sensitive to the sun. Keep out of the sun. If you cannot avoid being in the sun, wear protective clothing  and use sunscreen. Do not use sun lamps or tanning beds/booths. What side effects may I notice from receiving this medicine? Side effects that you should report to your  doctor or health care professional as soon as possible: -allergic reactions like skin rash, itching or hives, swelling of the face, lips, or tongue -changes in vision -decreased hearing or ringing of the ears -redness, blistering, peeling or loosening of the skin, including inside the mouth -seizures -sensitivity to light -signs and symptoms of a dangerous change in heartbeat or heart rhythm like chest pain; dizziness; fast or irregular heartbeat; palpitations; feeling faint or lightheaded, falls; breathing problems -signs and symptoms of liver injury like dark yellow or brown urine; general ill feeling or flu-like symptoms; light-colored stools; loss of appetite; nausea; right upper belly pain; unusually weak or tired; yellowing of the eyes or skin -signs and symptoms of low blood sugar such as feeling anxious; confusion; dizziness; increased hunger; unusually weak or tired; sweating; shakiness; cold; irritable; headache; blurred vision; fast heartbeat; loss of consciousness -uncontrollable head, mouth, neck, arm, or leg movements Side effects that usually do not require medical attention (report to your doctor or health care professional if they continue or are bothersome): -anxious -diarrhea -dizziness -hair loss -headache -irritable -loss of appetite -nausea, vomiting -stomach pain This list may not describe all possible side effects. Call your doctor for medical advice about side effects. You may report side effects to FDA at 1-800-FDA-1088. Where should I keep my medicine? Keep out of the reach of children. In children, this medicine can cause overdose with small doses. Store at room temperature between 15 and 30 degrees C (59 and 86 degrees F). Protect from moisture and light. Throw away any unused medicine after the expiration date. NOTE: This sheet is a summary. It may not cover all possible information. If you have questions about this medicine, talk to your doctor, pharmacist, or  health care provider.  2018 Elsevier/Gold Standard (2015-10-31 14:16:15)

## 2018-01-14 MED FILL — HYDROXYCHLOROQUINE SULFATE: 200 | 30 days supply | Qty: 60 | Fill #1

## 2018-01-19 ENCOUNTER — Encounter: Payer: Self-pay | Admitting: Physician Assistant

## 2018-01-19 ENCOUNTER — Ambulatory Visit (INDEPENDENT_AMBULATORY_CARE_PROVIDER_SITE_OTHER): Payer: Federal, State, Local not specified - PPO | Admitting: Physician Assistant

## 2018-01-19 VITALS — BP 125/82 | HR 92 | Ht 66.0 in | Wt 282.0 lb

## 2018-01-19 DIAGNOSIS — M79672 Pain in left foot: Secondary | ICD-10-CM

## 2018-01-19 DIAGNOSIS — M722 Plantar fascial fibromatosis: Secondary | ICD-10-CM | POA: Diagnosis not present

## 2018-01-19 NOTE — Progress Notes (Signed)
Subjective:    CC: Left foot pain  HPI: This is a very pleasant 47 year old female, for the past several months she had worsening pain the plantar aspect of her left foot, moderate, persistent without radiation.  We treated her sometime ago, about 5 years ago for plantar fasciitis with custom orthotics and an injection and she has done well until now.  I reviewed the past medical history, family history, social history, surgical history, and allergies today and no changes were needed.  Please see the problem list section below in epic for further details.  Past Medical History: Past Medical History:  Diagnosis Date  . Lupus ([place])   . Personal history of amenorrhea   . Sinusitis    Past Surgical History: Past Surgical History:  Procedure Laterality Date  . DILATION AND CURETTAGE OF UTERUS    . TONSILLECTOMY     Social History: Social History   Socioeconomic History  . Marital status: Widowed    Spouse name: Not on file  . Number of children: Not on file  . Years of education: Not on file  . Highest education level: Not on file  Occupational History  . Not on file  Social Needs  . Financial resource strain: Not on file  . Food insecurity:    Worry: Not on file    Inability: Not on file  . Transportation needs:    Medical: Not on file    Non-medical: Not on file  Tobacco Use  . Smoking status: Never Smoker  . Smokeless tobacco: Never Used  Substance and Sexual Activity  . Alcohol use: No  . Drug use: No  . Sexual activity: Not Currently    Birth control/protection: Pill    Comment: 1st intercourse- 19, partners-1, widow  Lifestyle  . Physical activity:    Days per week: Not on file    Minutes per session: Not on file  . Stress: Not on file  Relationships  . Social connections:    Talks on phone: Not on file    Gets together: Not on file    Attends religious service: Not on file    Active member of club or organization: Not on file    Attends meetings of  clubs or organizations: Not on file    Relationship status: Not on file  Other Topics Concern  . Not on file  Social History Narrative  . Not on file   Family History: Family History  Problem Relation Age of Onset  . Cancer Mother        brain  . Hypertension Father   . Diabetes Brother    Allergies: Allergies  Allergen Reactions  . Aspirin Other (See Comments)    GI issues  . Augmentin [Amoxicillin-Pot Clavulanate] Diarrhea  . Ciprofloxacin     Nausea and vomiting.   [name] Hydrocodone     Cough syrup/nausea  . Ivp Dye [Iodinated Diagnostic Agents]    Medications: See med rec.  Review of Systems: No fevers, chills, night sweats, weight loss, chest pain, or shortness of breath.   Objective:    General: Well Developed, well nourished, and in no acute distress.  Neuro: Alert and oriented x3, extra-ocular muscles intact, sensation grossly intact.  HEENT: Normocephalic, atraumatic, pupils equal round reactive to light, neck supple, no masses, no lymphadenopathy, thyroid nonpalpable.  Skin: Warm and dry, no rashes. Cardiac: Regular rate and rhythm, no murmurs rubs or gallops, no lower extremity edema.  Respiratory: Clear to auscultation bilaterally. Not using accessory  muscles, speaking in full sentences. Left Foot: No visible erythema or swelling. Range of motion is full in all directions. Strength is 5/5 in all directions. No hallux valgus. No pes cavus or pes planus. No abnormal callus noted. No pain over the navicular prominence, or base of fifth metatarsal. Mild tenderness to palpation of the calcaneal insertion of plantar fascia. No pain at the Achilles insertion. No pain over the calcaneal bursa. No pain of the retrocalcaneal bursa. No tenderness to palpation over the tarsals, metatarsals, or phalanges. No hallux rigidus or limitus. No tenderness palpation over interphalangeal joints. No pain with compression of the metatarsal heads. Neurovascularly intact  distally.  Procedure: Real-time Ultrasound Guided Injection of left plantar fascial origin Device: GE Logiq E  Verbal informed consent obtained.  Time-out conducted.  Noted no overlying erythema, induration, or other signs of local infection.  Skin prepped in a sterile fashion.  Local anesthesia: Topical Ethyl chloride.  With sterile technique and under real time ultrasound guidance: 1 cc Kenalog 40, 1 cc lidocaine, 1 cc bupivacaine injected easily. Completed without difficulty  Pain immediately resolved suggesting accurate placement of the medication.  Advised to call if fevers/chills, erythema, induration, drainage, or persistent bleeding.  Images permanently stored and available for review in the ultrasound unit.  Impression: Technically successful ultrasound guided injection.  Impression and Recommendations:    Plantar fasciitis, bilateral We did orthotics and an injection 5 years ago, now with recurrence of pain.   Repeat left plantar fascia injection. ___________________________________________ Gwen Her. Dianah Field, M.D., ABFM., CAQSM. Primary Care and Sports Medicine [facility]  Adjunct Professor of [employer]

## 2018-01-19 NOTE — Assessment & Plan Note (Addendum)
We did orthotics and an injection 5 years ago, now with recurrence of pain.   Repeat left plantar fascia injection.

## 2018-01-19 NOTE — Progress Notes (Signed)
   Subjective:    Patient ID: Jennifer Erickson, female    DOB: 1970-08-07, 47 y.o.   MRN: [id number]  HPI  Pt is a 47 yo female with bilateral plantar fasciitis who presents to the clinic with 2 days of left heel pain. Rates pain 9/10. She saw Dr. Caffie Pinto for many years and called to make appt and she has closed practice. She gets shots every 6 months to a year and they work well. She has used tylenol with no benefit. No injury. She does walk a lot but nothing out of the ordinary.   .. Active Ambulatory Problems    Diagnosis Date Noted  . Obesity, Class III, BMI 40-49.9 (morbid obesity) ([place]) 08/19/2011  . Insomnia 12/30/2011  . Plantar fasciitis, bilateral 11/01/2012  . Left breast mass 11/22/2012  . Obstructive sleep apnea 11/22/2012  . Bilateral swelling of feet 05/02/2013  . Vitamin D deficiency 04/19/2014  . Primary osteoarthritis of both knees 12/08/2014  . Large breasts 05/28/2015  . OSA on CPAP 07/10/2015  . Palpitations 07/11/2015  . 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 Elevated blood pressure reading 09/24/2017  . 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 Headache(784.0) 08/19/2011  . Uvular swelling 08/19/2011  . Uvular swelling 12/30/2011  . Dermatitis 05/26/2012  . Left lumbar radiculitis 01/25/2014  . Flank pain 11/01/2014  . Right-sided low back pain without sciatica 05/07/2015  . Lateral epicondylitis (tennis elbow) 05/28/2015  . Cramps of left lower extremity 11/28/2015  . Polyarthralgia 02/18/2016  . Toe fracture, right 04/15/2016  . Lateral epicondylitis of both elbows 12/03/2016  . Bilateral shoulder pain 12/03/2016  . Grief reaction 12/26/2016  . Coughing 07/03/2017   Past Medical History:  Diagnosis Date  . Lupus ([place])   . Personal history of amenorrhea   . Sinusitis      Review of Systems See HPI.     Objective:   Physical Exam  Constitutional: She is  oriented to person, place, and time. She appears well-developed and well-nourished.  Cardiovascular: Normal rate and regular rhythm.  Musculoskeletal:  Left heel medial plantar tenderness to palpation.  No pain with plantar or dorsiflexion.   Neurological: She is alert and oriented to person, place, and time.  Psychiatric: She has a normal mood and affect.          Assessment & Plan:   Marland KitchenMarland KitchenDiagnoses and all orders for this visit:  Pain of left heel  Plantar fasciitis, bilateral   Dr. Darene Lamer consulted for left heel injection. See note.  Encouraged NSAID use as needed and wearing good supportive shoes with orthotics. Ice feet after long days.   Dr. Darene Lamer will manage from now on.

## 2018-02-05 ENCOUNTER — Encounter: Payer: Self-pay | Admitting: Physician Assistant

## 2018-02-05 ENCOUNTER — Ambulatory Visit (INDEPENDENT_AMBULATORY_CARE_PROVIDER_SITE_OTHER): Payer: Federal, State, Local not specified - PPO | Admitting: Physician Assistant

## 2018-02-05 VITALS — BP 120/87 | HR 91 | Temp 98.1°F | Resp 14 | Wt 277.0 lb

## 2018-02-05 DIAGNOSIS — R82998 Other abnormal findings in urine: Secondary | ICD-10-CM

## 2018-02-05 DIAGNOSIS — R3989 Other symptoms and signs involving the genitourinary system: Secondary | ICD-10-CM

## 2018-02-05 DIAGNOSIS — J069 Acute upper respiratory infection, unspecified: Secondary | ICD-10-CM

## 2018-02-05 LAB — POCT URINALYSIS DIPSTICK
BILIRUBIN UA: NEGATIVE
GLUCOSE UA: NEGATIVE
KETONES UA: NEGATIVE
Nitrite, UA: POSITIVE
Protein, UA: POSITIVE — AB
SPEC GRAV UA: 1.025 (ref 1.010–1.025)
Urobilinogen, UA: 0.2 E.U./dL
pH, UA: 6 (ref 5.0–8.0)

## 2018-02-05 MED ORDER — CEFUROXIME AXETIL 250 MG PO TABS: 250.0000 mg | ORAL_TABLET | Freq: Two times a day (BID) | ORAL | 0 refills | Status: AC

## 2018-02-05 MED ORDER — IPRATROPIUM BROMIDE 0.06 % NA SOLN: 2.0000 | Freq: Four times a day (QID) | NASAL | 0 refills | Status: DC | PRN

## 2018-02-05 MED FILL — IPRATROPIUM 0.06% SPRAY: 0.06 | 10 days supply | Qty: 15 | Fill #0

## 2018-02-05 MED FILL — CEFUROXIME AXETIL 500 MG TA: 500 | 7 days supply | Qty: 7 | Fill #0

## 2018-02-05 NOTE — Patient Instructions (Addendum)
For nasal symptoms/sinusitis: - prescription Atrovent nasal spray: 2 sprays each nostril, up to 4 times per day as needed - warning: do not use OTC nasal sprays like Afrin (oxymetazoline) for more than 3 days as it will cause worsening congestion/nasal symptoms) - nasal saline rinses / netti pot (do this prior to nasal spray) - warm facial compresses - oral decongestants and antihistamines like Claritin-D and Zyrtec-D may help dry up secretions (caution using decongestants if you have high blood pressure, heart disease or kidney disease) - for sinus headache: Tylenol 1068m every 8 hours as needed. Alternate with Ibuprofen 6040mevery 6 hours   For cough: - Cough is a protective mechanism and an important part of fighting an infection. I encourage you to avoid suppressing your cough with medication during the day if possible.  - Mucinex with at least 8 oz. of water can loosen chest congestion and make cough more productive, which means you will actually cough less - Okay to use a cough suppressant at bedtime in order to rest (Nyquil, Delsym, Robitussin, etc.)  Note: follow package instructions for all over-the-counter medications. If using multi-symptom medications (Dayquil, Theraflu, etc.), check the label for duplicate drug ingredients.    Upper Respiratory Infection, Adult Most upper respiratory infections (URIs) are a viral infection of the air passages leading to the lungs. A URI affects the nose, throat, and upper air passages. The most common type of URI is nasopharyngitis and is typically referred to as "the common cold." URIs run their course and usually go away on their own. Most of the time, a URI does not require medical attention, but sometimes a bacterial infection in the upper airways can follow a viral infection. This is called a secondary infection. Sinus and middle ear infections are common types of secondary upper respiratory infections. Bacterial pneumonia can also complicate  a URI. A URI can worsen asthma and chronic obstructive pulmonary disease (COPD). Sometimes, these complications can require emergency medical care and may be life threatening. What are the causes? Almost all URIs are caused by viruses. A virus is a type of germ and can spread from one person to another. What increases the risk? You may be at risk for a URI if:  You smoke.  You have chronic heart or lung disease.  You have a weakened defense (immune) system.  You are very young or very old.  You have nasal allergies or asthma.  You work in crowded or poorly ventilated areas.  You work in health care facilities or schools.  What are the signs or symptoms? Symptoms typically develop 2-3 days after you come in contact with a cold virus. Most viral URIs last 7-10 days. However, viral URIs from the influenza virus (flu virus) can last 14-18 days and are typically more severe. Symptoms may include:  Runny or stuffy (congested) nose.  Sneezing.  Cough.  Sore throat.  Headache.  Fatigue.  Fever.  Loss of appetite.  Pain in your forehead, behind your eyes, and over your cheekbones (sinus pain).  Muscle aches.  How is this diagnosed? Your health care provider may diagnose a URI by:  Physical exam.  Tests to check that your symptoms are not due to another condition such as: ? Strep throat. ? Sinusitis. ? Pneumonia. ? Asthma.  How is this treated? A URI goes away on its own with time. It cannot be cured with medicines, but medicines may be prescribed or recommended to relieve symptoms. Medicines may help:  Reduce your fever.  Reduce your cough.  Relieve nasal congestion.  Follow these instructions at home:  Take medicines only as directed by your health care provider.  Gargle warm saltwater or take cough drops to comfort your throat as directed by your health care provider.  Use a warm mist humidifier or inhale steam from a shower to increase air moisture. This  may make it easier to breathe.  Drink enough fluid to keep your urine clear or pale yellow.  Eat soups and other clear broths and maintain good nutrition.  Rest as needed.  Return to work when your temperature has returned to normal or as your health care provider advises. You may need to stay home longer to avoid infecting others. You can also use a face mask and careful hand washing to prevent spread of the virus.  Increase the usage of your inhaler if you have asthma.  Do not use any tobacco products, including cigarettes, chewing tobacco, or electronic cigarettes. If you need help quitting, ask your health care provider. How is this prevented? The best way to protect yourself from getting a cold is to practice good hygiene.  Avoid oral or hand contact with people with cold symptoms.  Wash your hands often if contact occurs.  There is no clear evidence that vitamin C, vitamin E, echinacea, or exercise reduces the chance of developing a cold. However, it is always recommended to get plenty of rest, exercise, and practice good nutrition. Contact a health care provider if:  You are getting worse rather than better.  Your symptoms are not controlled by medicine.  You have chills.  You have worsening shortness of breath.  You have brown or red mucus.  You have yellow or brown nasal discharge.  You have pain in your face, especially when you bend forward.  You have a fever.  You have swollen neck glands.  You have pain while swallowing.  You have white areas in the back of your throat. Get help right away if:  You have severe or persistent: ? Headache. ? Ear pain. ? Sinus pain. ? Chest pain.  You have chronic lung disease and any of the following: ? Wheezing. ? Prolonged cough. ? Coughing up blood. ? A change in your usual mucus.  You have a stiff neck.  You have changes in your: ? Vision. ? Hearing. ? Thinking. ? Mood. This information is not intended to  replace advice given to you by your health care provider. Make sure you discuss any questions you have with your health care provider. Document Released: 09/10/2000 Document Revised: 11/18/2015 Document Reviewed: 06/22/2013 Elsevier Interactive Patient Education  [employer].

## 2018-02-05 NOTE — Progress Notes (Signed)
HPI:                                                                REEDA SOOHOO is a 47 y.o. female who presents to [facility] today for URI symptoms  URI   This is a new problem. The current episode started in the past 7 days. The problem has been unchanged. There has been no fever. Associated symptoms include congestion, coughing (mild), headaches and sneezing. Pertinent negatives include no abdominal pain, nausea or sore throat. She has tried acetaminophen (Sudafed) for the symptoms. The treatment provided mild relief.   Reports her urine is darker yellow than normal with an odor. Denies hematuria, dysuria, frequency, urgency, abdominal/flank pain. Denies chance or pregnancy. Nexplanon in place. History of recurrent UTI, last one in August 2019, culture grew citrobacter koseri.   Past Medical History:  Diagnosis Date  . Lupus ([place])   . Personal history of amenorrhea   . Sinusitis    Past Surgical History:  Procedure Laterality Date  . DILATION AND CURETTAGE OF UTERUS    . TONSILLECTOMY     Social History   Tobacco Use  . Smoking status: Never Smoker  . Smokeless tobacco: Never Used  Substance Use Topics  . Alcohol use: No   family history includes Cancer in her mother; Diabetes in her brother; Hypertension in her father.    ROS: negative except as noted in the HPI  Medications: Current Outpatient Medications  Medication Sig Dispense Refill  . AMBULATORY NON FORMULARY MEDICATION Continuous positive airway pressure (CPAP) machine auto-titrate from 4-20 cm of H2O pressure, with all supplemental supplies as needed. Patient previously managed with [place] 30 day report to Dr Georgina Snell [phone] after 30 days 1 each 0  . cefUROXime (CEFTIN) 250 MG tablet Take 1 tablet (250 mg total) by mouth 2 (two) times daily with a meal for 7 days. 14 tablet 0  . EPINEPHrine (EPIPEN 2-PAK) 0.3 mg/0.3 mL IJ SOAJ  injection INJECT 0.3 MLS (0.3 MG TOTAL) INTO THE MUSCLE ONCE. 1 Device 4  . etonogestrel (IMPLANON) 68 MG IMPL implant Inject 1 each into the skin once.    . fluocinonide (LIDEX) 0.05 % external solution Apply 1 application topically 2 (two) times daily. 60 mL 12  . hydroxychloroquine (PLAQUENIL) 200 MG tablet Take 200 mg by mouth 2 (two) times daily.  3  . ipratropium (ATROVENT) 0.06 % nasal spray Place 2 sprays into both nostrils 4 (four) times daily as needed for rhinitis. 15 mL 0   No current facility-administered medications for this visit.    Allergies  Allergen Reactions  . Aspirin Other (See Comments)    GI issues  . Augmentin [Amoxicillin-Pot Clavulanate] Diarrhea  . Ciprofloxacin     Nausea and vomiting.   [name] Hydrocodone     Cough syrup/nausea  . Ivp Dye [Iodinated Diagnostic Agents]        Objective:  BP 120/87   Pulse 91   Temp 98.1 F (36.7 C) (Oral)   Wt 277 lb (125.6 kg)   SpO2 95%   BMI 44.71 kg/m  Gen:  alert, not ill-appearing, no distress, appropriate for age, obese female HEENT: head normocephalic without obvious abnormality, conjunctiva and cornea  clear, TM's obstructed by cerumen, oropharynx clear, tonsils absent, neck supple, no cervical adenopathy, trachea midline Pulm: Normal work of breathing, normal phonation, clear to auscultation bilaterally, no wheezes, rales or rhonchi CV: Normal rate, regular rhythm, s1 and s2 distinct, no murmurs, clicks or rubs  GI: no CVA tenderness Neuro: alert and oriented x 3, no tremor MSK: extremities atraumatic, normal gait and station Skin: intact, no rashes on exposed skin, no jaundice, no cyanosis    No results found for this or any previous visit (from the past 72 hour(s)). No results found.    Assessment and Plan: 47 y.o. female with   .U[place]chi was seen today for cough.  Diagnoses and all orders for this visit:  Leukocytes in urine -     cefUROXime (CEFTIN) 250 MG tablet; Take 1 tablet (250 mg total)  by mouth 2 (two) times daily with a meal for 7 days.  Abnormal urine color -     POCT Urinalysis Dipstick -     Urine Culture -     cefUROXime (CEFTIN) 250 MG tablet; Take 1 tablet (250 mg total) by mouth 2 (two) times daily with a meal for 7 days.  Acute upper respiratory infection -     ipratropium (ATROVENT) 0.06 % nasal spray; Place 2 sprays into both nostrils 4 (four) times daily as needed for rhinitis.   Urinary symptoms - history of UTI 11/27/17, Citrobacter koseri sensitive to everything. UA positive for moderate leuks and trace blood. Treating empirically for uncomplicated cystitis with Ceftin. Urine culture pending Patient requested Diflucan for history of yeast infections with oral antibiotic use  Acute URI - afebrile, no tachypnea, no tachycardia, SpO2 95% on RA at rest, no adventitious lung sounds. Counseled on supportive care    Patient education and anticipatory guidance given Patient agrees with treatment plan Follow-up as needed if symptoms worsen or fail to improve  Darlyne Russian PA-C

## 2018-02-08 ENCOUNTER — Encounter: Payer: Self-pay | Admitting: Physician Assistant

## 2018-02-08 LAB — URINE CULTURE
MICRO NUMBER:: 91348834
SPECIMEN QUALITY:: ADEQUATE

## 2018-02-08 MED ORDER — FLUCONAZOLE 150 MG PO TABS: 150.0000 mg | ORAL_TABLET | Freq: Once | ORAL | 0 refills | Status: AC

## 2018-02-08 MED FILL — FLUCONAZOLE 150 MG TABS: 150 | 1 days supply | Qty: 1 | Fill #0

## 2018-02-15 ENCOUNTER — Encounter: Payer: Self-pay | Admitting: Osteopathic Medicine

## 2018-02-15 ENCOUNTER — Ambulatory Visit (INDEPENDENT_AMBULATORY_CARE_PROVIDER_SITE_OTHER): Payer: Federal, State, Local not specified - PPO | Admitting: Osteopathic Medicine

## 2018-02-15 VITALS — BP 151/79 | HR 94 | Temp 98.2°F | Wt 280.0 lb

## 2018-02-15 DIAGNOSIS — R0981 Nasal congestion: Secondary | ICD-10-CM

## 2018-02-15 DIAGNOSIS — J069 Acute upper respiratory infection, unspecified: Secondary | ICD-10-CM | POA: Diagnosis not present

## 2018-02-15 DIAGNOSIS — R0989 Other specified symptoms and signs involving the circulatory and respiratory systems: Secondary | ICD-10-CM | POA: Diagnosis not present

## 2018-02-15 MED ORDER — METHYLPREDNISOLONE SODIUM SUCC 125 MG IJ SOLR
125.0000 mg | Freq: Once | INTRAMUSCULAR | Status: AC
  Administered 2018-02-15: 125 mg via INTRAMUSCULAR

## 2018-02-15 MED ORDER — LIDOCAINE VISCOUS HCL 2 % MT SOLN: 5.0000 mL | OROMUCOSAL | 0 refills | Status: DC | PRN

## 2018-02-15 MED FILL — LIDOCAINE 2% VISCOUS SOLN: 2 | 2 days supply | Qty: 100 | Fill #0

## 2018-02-15 MED FILL — HYDROXYCHLOROQUINE SULFATE: 200 | 30 days supply | Qty: 60 | Fill #2

## 2018-02-15 NOTE — Patient Instructions (Signed)
I do not see or feel anything on exam to give me concern for throat closing. I think your throat is irritated from recent illness and is sending your brain an "over-reacting" reflex. Will try to see if we can calm this down with steroid shot in the office, and with numbing medicine to the throat as needed.   If sensations worsen despite treatment with steroids and numbing medicine, please seek emergency medical attention. Please go to the hospital anytime you feel severe shortness of breath or that your airway might be closing, that way if there needs to be a tube down the throat to help you breathe, you are in the right place.

## 2018-02-15 NOTE — Progress Notes (Signed)
HPI: Jennifer Erickson is a 47 y.o. female who  has a past medical history of Lupus ([place]), Personal history of amenorrhea, and Sinusitis.  she presents to [facility], 02/15/18,  for chief complaint of:  ALLERGIC REACTION EVALUATION  Patient walked in with concern for "throat closing" sensation. Started a few hours ago. Feels like "walls of my throat are touching" and "not getting enough air." Assoc w/ dry throat, she denies possibility of allergic reaction. Hx tonsillectomy.  Recently seen here 02/05/18 for acute URI, taking abx (Ceftin for UTI) and nasal spray   Substance ingested/exposed? none Time ingested/exposed? none  History of anaphylaxis? None  Danger signs present...   Rapid progression of symptoms? No   Respiratory distress (eg, stridor, wheezing, dyspnea, increased work of breathing, persistent cough, cyanosis)? No   Vomiting? No   Abdominal pain? No   Hypotension? No   Dysrhythmia? No   Chest pain? No   Collapse? No   Known allergies include... Allergies  Allergen Reactions  . Aspirin Other (See Comments)    GI issues  . Augmentin [Amoxicillin-Pot Clavulanate] Diarrhea  . Ciprofloxacin     Nausea and vomiting.   [name] Hydrocodone     Cough syrup/nausea  . Ivp Dye [Iodinated Diagnostic Agents]          At today's visit... Past medical history, surgical history, and family history reviewed and updated as needed.  Current medication list and allergy/intolerance information reviewed and updated as needed. (See remainder of HPI, ROS, Phys Exam below)   No results found.  No results found for this or any previous visit (from the past 72 hour(s)).        ASSESSMENT/PLAN: The primary encounter diagnosis was Sensation, choking. Diagnoses of Viral URI and Nasal congestion were also pertinent to this visit.    Meds ordered this encounter  Medications  . lidocaine (XYLOCAINE) 2 % solution    Sig: Use  as directed 5-10 mLs in the mouth or throat every 3 (three) hours as needed (throat discomfort - as directed).    Dispense:  100 mL    Refill:  0  . methylPREDNISolone sodium succinate (SOLU-MEDROL) 125 mg/2 mL injection 125 mg    Patient Instructions  I do not see or feel anything on exam to give me concern for throat closing. I think your throat is irritated from recent illness and is sending your brain an "over-reacting" reflex. Will try to see if we can calm this down with steroid shot in the office, and with numbing medicine to the throat as needed.   If sensations worsen despite treatment with steroids and numbing medicine, please seek emergency medical attention. Please go to the hospital anytime you feel severe shortness of breath or that your airway might be closing, that way if there needs to be a tube down the throat to help you breathe, you are in the right place.     Follow-up plan: Return if symptoms worsen or fail to improve, otherwise as directed with PCP .                             ############################################ ############################################ ############################################ ############################################    No outpatient medications have been marked as taking for the 02/15/18 encounter (Appointment) with Emeterio Reeve, DO.    Allergies  Allergen Reactions  . Aspirin Other (See Comments)    GI issues  . Augmentin [Amoxicillin-Pot Clavulanate] Diarrhea  .  Ciprofloxacin     Nausea and vomiting.   [name] Hydrocodone     Cough syrup/nausea  . Ivp Dye [Iodinated Diagnostic Agents]        Review of Systems:  Constitutional: +recent illness, no fever, no fatigue  HEENT: No  headache, no vision change, see HPI for throat complaints, +sinus congestion   Cardiac: No  chest pain, No  pressure, No palpitations  Respiratory:  +shortness of breath. No  Cough  Gastrointestinal: No   abdominal pain, no change on bowel habits  Musculoskeletal: No new myalgia/arthralgia  Skin: No  Rash  Neurologic: No  weakness, No  Dizziness  Psychiatric: No  concerns with depression, No  concerns with anxiety  Exam:  BP (!) 151/79 (BP Location: Left Arm, Patient Position: Sitting, Cuff Size: Large)   Pulse 94   Temp 98.2 F (36.8 C) (Oral)   Wt 280 lb (127 kg)   SpO2 99%   BMI 45.19 kg/m   Constitutional: VS see above. General Appearance: alert, well-developed, well-nourished, NAD  Eyes: Normal lids and conjunctive, non-icteric sclera  Ears, Nose, Mouth, Throat: MMM, Normal external inspection ears/nares/mouth/lips/gums. Normal pharynx, tonsils surgically absent, no mas, uvula normal, tongue normal,   Neck: No masses, trachea midline. no lymphadenopathy in neck or supraclavicular region  Respiratory: Normal respiratory effort. no wheeze, no rhonchi, no rales  Cardiovascular: S1/S2 normal, no murmur, no rub/gallop auscultated. RRR.   Musculoskeletal: Gait normal. Symmetric and independent movement of all extremities  Neurological: Normal balance/coordination. No tremor.  Skin: warm, dry, intact.   Psychiatric: Normal judgment/insight. Normal mood and affect. Oriented x3.       Visit summary with medication list and pertinent instructions was printed for patient to review, patient was advised to alert [place] if any updates are needed. All questions at time of visit were answered - patient instructed to contact office with any additional concerns. ER/RTC precautions were reviewed with the patient and understanding verbalized.     Please note: voice recognition software was used to produce this document, and typos may escape review. Please contact Dr. Sheppard Coil for any needed clarifications.    Follow up plan: Return if symptoms worsen or fail to improve, otherwise as directed with PCP .

## 2018-02-17 DIAGNOSIS — Z79899 Other long term (current) drug therapy: Secondary | ICD-10-CM | POA: Insufficient documentation

## 2018-02-17 DIAGNOSIS — Z0000 Encounter for general adult medical examination without abnormal findings: Secondary | ICD-10-CM | POA: Insufficient documentation

## 2018-02-17 DIAGNOSIS — L930 Discoid lupus erythematosus: Secondary | ICD-10-CM | POA: Diagnosis not present

## 2018-02-22 ENCOUNTER — Encounter: Payer: Self-pay | Admitting: Physician Assistant

## 2018-02-22 ENCOUNTER — Ambulatory Visit (INDEPENDENT_AMBULATORY_CARE_PROVIDER_SITE_OTHER): Payer: Federal, State, Local not specified - PPO | Admitting: Physician Assistant

## 2018-02-22 DIAGNOSIS — R51 Headache: Secondary | ICD-10-CM
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
## 2018-02-22 NOTE — Progress Notes (Deleted)
   Subjective:    Patient ID: Jennifer Erickson, female    DOB: January 18, 1971, 47 y.o.   MRN: [id number]  HPI    Review of Systems     Objective:   Physical Exam        Assessment & Plan:

## 2018-02-26 MED FILL — BETAMETHASONE DIPROPIONATE: 0.05 | 30 days supply | Qty: 50 | Fill #0

## 2018-03-04 DIAGNOSIS — L930 Discoid lupus erythematosus: Secondary | ICD-10-CM | POA: Diagnosis not present

## 2018-03-04 DIAGNOSIS — M2550 Pain in unspecified joint: Secondary | ICD-10-CM | POA: Diagnosis not present

## 2018-03-08 ENCOUNTER — Ambulatory Visit: Payer: Self-pay | Admitting: Family Medicine

## 2018-03-11 ENCOUNTER — Ambulatory Visit (INDEPENDENT_AMBULATORY_CARE_PROVIDER_SITE_OTHER): Payer: Federal, State, Local not specified - PPO | Admitting: Family Medicine

## 2018-03-11 ENCOUNTER — Encounter: Payer: Self-pay | Admitting: Family Medicine

## 2018-03-11 VITALS — BP 135/87 | HR 85 | Ht 66.0 in | Wt 278.0 lb

## 2018-03-11 DIAGNOSIS — Z9989 Dependence on other enabling machines and devices: Secondary | ICD-10-CM | POA: Diagnosis not present

## 2018-03-11 DIAGNOSIS — M797 Fibromyalgia: Secondary | ICD-10-CM | POA: Diagnosis not present

## 2018-03-11 DIAGNOSIS — G4733 Obstructive sleep apnea (adult) (pediatric): Secondary | ICD-10-CM

## 2018-03-11 MED ORDER — CELECOXIB 200 MG PO CAPS: 200.0000 mg | ORAL_CAPSULE | Freq: Two times a day (BID) | ORAL | 2 refills | Status: DC | PRN

## 2018-03-11 MED ORDER — AMBULATORY NON FORMULARY MEDICATION: 0 refills | Status: DC

## 2018-03-11 MED ORDER — DICLOFENAC SODIUM 1 % TD GEL: 4.0000 g | Freq: Four times a day (QID) | TRANSDERMAL | 11 refills | Status: DC

## 2018-03-11 NOTE — Patient Instructions (Signed)
Thank you for coming in today. For topical pain control.  Prescription diclofenac gel as needed up to 4x daily.  Over the counter aspercream Over the counter capsaicin cream can be used.   Ok to take oral Celebrex up to 2x daily for pain as needed.   You should hear about CPAP supplier soon.  Let me know if you do not hear anything.   TENS UNIT: This is helpful for muscle pain and spasm.   Search and Purchase a TENS 7000 2nd edition at  www.tenspros.com or www.[place].com It should be less than $30.     TENS unit instructions: Do not shower or bathe with the unit on Turn the unit off before removing electrodes or batteries If the electrodes lose stickiness add a drop of water to the electrodes after they are disconnected from the unit and place on plastic sheet. If you continued to have difficulty, call the TENS unit company to purchase more electrodes. Do not apply lotion on the skin area prior to use. Make sure the skin is clean and dry as this will help prolong the life of the electrodes. After use, always check skin for unusual red areas, rash or other skin difficulties. If there are any skin problems, does not apply electrodes to the same area. Never remove the electrodes from the unit by pulling the wires. Do not use the TENS unit or electrodes other than as directed. Do not change electrode placement without consultating your therapist or physician. Keep 2 fingers with between each electrode. Wear time ratio is 2:1, on to off times.    For example on for 30 minutes off for 15 minutes and then on for 30 minutes off for 15 minutes

## 2018-03-11 NOTE — Progress Notes (Signed)
Jennifer Erickson is a 47 y.o. female who presents to [facility] today for diffuse body aches muscle pain and fatigue.  Jennifer Erickson notes diffuse joint pain and muscle aches across extremities and trunk.  This is been ongoing for years and waxes and wanes.  She notes pain occurs in her trapezius is, shoulder, upper extremities, back, knees, wrists and hands.  She is an evaluation and found to have degenerative changes in some of the joints but not all of them.  She has been taking tumeric, and ginger which have been mildly helpful.  She will occasionally take ibuprofen or Tylenol but is reluctant to take them regularly.  She is concerned about side effects of prescription or regular use of over-the-counter medications.  Additionally she has had a pretty extensive work-up this year.  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  She is currently on Plaquenil.  She notes that Plaquenil has not changed her joint pains much.  Her rheumatologist do not think that she has systemic lupus.  Additionally she notes significant fatigue.  This is been ongoing for years.  She has been diagnosed with sleep apnea in the past.  She does have a CPAP machine at home but notes that the mask fit is uncomfortable has not been using it at all.  She thinks this may be a significant cause of her fatigue.  She feels tired during the day and does not feel rested when she wakes up in the morning.   ROS as above:  Exam:  BP 135/87   Pulse 85   Ht 5' 6"  (1.676 m)   Wt 278 lb (126.1 kg)   BMI 44.87 kg/m  Wt Readings from Last 5 Encounters:  03/11/18 278 lb (126.1 kg)  02/22/18 278 lb (126.1 kg)  02/15/18 280 lb (127 kg)  02/05/18 277 lb (125.6 kg)  01/19/18 282 lb (127.9 kg)    Gen: Well NAD obese HEENT: EOMI,  MMM    Lungs: Normal work of breathing. CTABL Heart: RRR no MRG Abd: NABS, Soft. Nondistended, Nontender Exts: Brisk capillary refill, warm and well perfused.  MSK.  Normal motion shoulders elbows wrists knees hips bilaterally. Tender palpation trapezius muscle belly deltoids upper arms forearms thighs back  Lab and Radiology Results Lab Results  Component Value Date   TSH 3.41 11/02/2017       Assessment and Plan: 47 y.o. female with  Fatigue.  Very likely sleep apnea.  Patient has uncontrolled sleep apnea.  Plan to reorder CPAP.  Recheck if not improved.  Pain: Possible due to just multiple discrete orthopedic issues however think more systemic issue is more likely.  She does have cutaneous lupus however it is unlikely that her pain is due to systemic lupus given her thorough work-up with rheumatology already.  I think fibromyalgia is much more likely.  Lengthy discussion regarding options.  She like to avoid taking daily medications if possible.  Discussed various topical medication options including diclofenac gel.  Additionally will consider intermittent oral Celebrex.  She had does have a history of stomach ulcer and this should help reduce that.  Lastly I also discussed Cymbalta as a possible daily medication.  She like to hold off on that if possible.  I spent 25 minutes with this patient, greater than 50% was face-to-face time counseling regarding differential diagnosis and treatment plan and options.[name]  No orders of the defined types were placed in this encounter.  Meds ordered this encounter  Medications  . AMBULATORY NON FORMULARY MEDICATION    Sig: Continuous positive airway pressure (CPAP) machine auto-titrate from 4-20 cm of H2O pressure, with all supplemental supplies as needed.  Send 30 day report to Dr Georgina Snell [phone] after 30 days    Dispense:  1 each    Refill:  0  . diclofenac sodium (VOLTAREN) 1 % GEL    Sig: Apply 4 g topically 4 (four) times daily. To  affected joint.    Dispense:  100 g    Refill:  11  . celecoxib (CELEBREX) 200 MG capsule    Sig: Take 1 capsule (200 mg total) by mouth 2 (two) times daily as needed for moderate pain.    Dispense:  60 capsule    Refill:  2     Historical information moved to improve visibility of documentation.  Past Medical History:  Diagnosis Date  . Lupus ([place])   . Personal history of amenorrhea   . Sinusitis    Past Surgical History:  Procedure Laterality Date  . DILATION AND CURETTAGE OF UTERUS    . TONSILLECTOMY     Social History   Tobacco Use  . Smoking status: Never Smoker  . Smokeless tobacco: Never Used  Substance Use Topics  . Alcohol use: No   family history includes Cancer in her mother; Diabetes in her brother; Hypertension in her father.  Medications: Current Outpatient Medications  Medication Sig Dispense Refill  . AMBULATORY NON FORMULARY MEDICATION Continuous positive airway pressure (CPAP) machine auto-titrate from 4-20 cm of H2O pressure, with all supplemental supplies as needed.  Send 30 day report to Dr Georgina Snell [phone] after 30 days 1 each 0  . EPINEPHrine (EPIPEN 2-PAK) 0.3 mg/0.3 mL IJ SOAJ injection INJECT 0.3 MLS (0.3 MG TOTAL) INTO THE MUSCLE ONCE. 1 Device 4  . etonogestrel (IMPLANON) 68 MG IMPL implant Inject 1 each into the skin once.    . hydroxychloroquine (PLAQUENIL) 200 MG tablet Take 200 mg by mouth 2 (two) times daily.  3  . celecoxib (CELEBREX) 200 MG capsule Take 1 capsule (200 mg total) by mouth 2 (two) times daily as needed for moderate pain. 60 capsule 2  . diclofenac sodium (VOLTAREN) 1 % GEL Apply 4 g topically 4 (four) times daily. To affected joint. 100 g 11   No current facility-administered medications for this visit.    Allergies  Allergen Reactions  . Aspirin Other (See Comments)    GI issues  . Augmentin [Amoxicillin-Pot Clavulanate] Diarrhea  . Ciprofloxacin     Nausea and vomiting.   [name] Hydrocodone     Cough syrup/nausea   . Ivp Dye [Iodinated Diagnostic Agents]      Discussed warning signs or symptoms. Please see discharge instructions. Patient expresses understanding.

## 2018-03-19 MED FILL — DICLOFENAC SODIUM 1 % GEL: 1 | 7 days supply | Qty: 100 | Fill #0

## 2018-03-19 MED FILL — CELECOXIB 200 MG CAP: 200 | 30 days supply | Qty: 60 | Fill #0

## 2018-03-25 DIAGNOSIS — G4733 Obstructive sleep apnea (adult) (pediatric): Secondary | ICD-10-CM | POA: Diagnosis not present

## 2018-03-26 ENCOUNTER — Telehealth: Payer: Self-pay | Admitting: Family Medicine

## 2018-03-26 NOTE — Telephone Encounter (Signed)
Pt advised and scheduled for appt in February.

## 2018-03-26 NOTE — Telephone Encounter (Signed)
Received CPAP set up confirmation from [facility].  Patient was set up on March 25, 2018.  She is to return to clinic after 30-day compliance report is sent.  This should occur between January 26 and June 23, 2018.  We will contact patient and ask her to schedule an appointment for mid February.  We should have compliance report then

## 2018-04-05 ENCOUNTER — Ambulatory Visit (INDEPENDENT_AMBULATORY_CARE_PROVIDER_SITE_OTHER): Payer: Federal, State, Local not specified - PPO | Admitting: Family Medicine

## 2018-04-05 ENCOUNTER — Encounter: Payer: Self-pay | Admitting: Family Medicine

## 2018-04-05 VITALS — BP 149/86 | HR 80 | Ht 66.0 in | Wt 279.0 lb

## 2018-04-05 DIAGNOSIS — Z9989 Dependence on other enabling machines and devices: Secondary | ICD-10-CM

## 2018-04-05 DIAGNOSIS — M7712 Lateral epicondylitis, left elbow: Secondary | ICD-10-CM

## 2018-04-05 DIAGNOSIS — G4733 Obstructive sleep apnea (adult) (pediatric): Secondary | ICD-10-CM

## 2018-04-05 NOTE — Progress Notes (Signed)
Elbow pain

## 2018-04-05 NOTE — Patient Instructions (Addendum)
Thank you for coming in today. I think you have tennis elbow (lateral epicondylitis) Work on the elbow straight and wrist down stretch as much as you can.  Do the rehab with the elbow straight.  Let the wrist go from up position to the down position slowly with resistance.  Consider using a theraband flexbar or use a 1-2 pound hand weight.   Make sure they send the 30 day report to me or Jade for sleep apnea.    Tennis Elbow Rehab Ask your health care provider which exercises are safe for you. Do exercises exactly as told by your health care provider and adjust them as directed. It is normal to feel mild stretching, pulling, tightness, or discomfort as you do these exercises, but you should stop right away if you feel sudden pain or your pain gets worse. Do not begin these exercises until told by your health care provider. Stretching and range of motion exercises These exercises warm up your muscles and joints and improve the movement and flexibility of your elbow. These exercises also help to relieve pain, numbness, and tingling. Exercise A: Wrist extensor stretch 1. Extend your left / right elbow with your fingers pointing down. 2. Gently pull the palm of your left / right hand toward you until you feel a gentle stretch on the top of your forearm. 3. To increase the stretch, push your left / right hand toward the outer edge or pinkie side of your forearm. 4. Hold this position for __________ seconds. Repeat __________ times. Complete this exercise __________ times a day. If directed by your health care provider, repeat this stretch except do it with a bent elbow this time. Exercise B: Wrist flexor stretch  1. Extend your left / right elbow and turn your palm upward. 2. Gently pull your left / right palm and fingertips back so your wrist extends and your fingers point more toward the ground. 3. You should feel a gentle stretch on the inside of your forearm. 4. Hold this position for  __________ seconds. Repeat __________ times. Complete this exercise __________ times a day. If directed by your health care provider, repeat this stretch except do it with a bent elbow this time. Strengthening exercises These exercises build strength and endurance in your elbow. Endurance is the ability to use your muscles for a long time, even after they get tired. Exercise C: Wrist extensors  1. Sit with your left / right forearm palm-down and fully supported on a table or countertop. Your elbow should be resting below the height of your shoulder. 2. Let your left / right wrist extend over the edge of the surface. 3. Loosely hold a __________ weight or a piece of rubber exercise band or tubing in your left / right hand. Slowly curl your left / right hand up toward your forearm. If you are using band or tubing, hold the band or tubing in place with your other hand to provide resistance. 4. Hold this position for __________ seconds. 5. Slowly return to the starting position. Repeat __________ times. Complete this exercise __________ times a day. Exercise D: Radial deviators  1. Stand with a __________ weight in your left / righthand. Or, sit while holding a rubber exercise band or tubing with your other arm supported on a table or countertop. Position your hand so your thumb is on top. 2. Raise your hand upward in front of you so your thumb travels toward your forearm, or pull up on the rubber tubing.  3. Hold this position for __________ seconds. 4. Slowly return to the starting position. Repeat __________ times. Complete this exercise __________ times a day. Exercise E: Eccentric wrist extensors 1. Sit with your left / right forearm palm-down and fully supported on a table or countertop. Your elbow should be resting below the height of your shoulder. 2. If told by your health care provider, hold a __________ weight in your hand. 3. Let your left / right wrist extend over the edge of the  surface. 4. Use your other hand to lift up your left / right hand toward your forearm. Keep your forearm on the table. 5. Using only the muscles in your left / right hand, slowly lower your hand back down to the starting position. Repeat __________ times. Complete this exercise __________ times a day. This information is not intended to replace advice given to you by your health care provider. Make sure you discuss any questions you have with your health care provider. Document Released: 03/17/2005 Document Revised: 11/21/2015 Document Reviewed: 12/14/2014 Elsevier Interactive Patient Education  2019 [employer].

## 2018-04-05 NOTE — Progress Notes (Signed)
Jennifer Erickson is a 48 y.o. female who presents to [facility] [place] today for left elbow pain.  Jennifer Erickson's pain in the left lateral elbow.  This is been ongoing for a few weeks and occurs without injury.  Pain is worse with wrist extension grip.  She is not been tried much home exercise program or treatment.  She is tried some Tylenol which helps a bit.  She denies any fevers or chills nausea vomiting or diarrhea.  Additionally she notes that she has received her CPAP and has been using it and feeling a bit better.  She is only been using it for a few nights.   ROS as above:  Exam:  BP (!) 149/86   Pulse 80   Ht 5' 6"  (1.676 m)   Wt 279 lb (126.6 kg)   BMI 45.03 kg/m  Wt Readings from Last 5 Encounters:  04/05/18 279 lb (126.6 kg)  03/11/18 278 lb (126.1 kg)  02/22/18 278 lb (126.1 kg)  02/15/18 280 lb (127 kg)  02/05/18 277 lb (125.6 kg)    Gen: Well NAD HEENT: EOMI,  MMM Lungs: Normal work of breathing. CTABL Heart: RRR no MRG Abd: NABS, Soft. Nondistended, Nontender Exts: Brisk capillary refill, warm and well perfused.  Left elbow normal-appearing normal motion.  Tender palpation lateral epicondyle. Elbow strength is intact. Lateral epicondyle pain is reproduced with resisted wrist extension. Contralateral right elbow normal-appearing nontender normal motion and strength. Pulses capillary fill and sensation are intact bilateral upper extremities.  Lab and Radiology Results No results found for this or any previous visit (from the past 72 hour(s)). No results found.    Assessment and Plan: 48 y.o. female with  Left elbow pain is lateral epicondylitis.  Plan to treat with home exercise program eccentric exercises and pre-existing prescription for diclofenac gel.  Recheck if not improving.  Sleep apnea: Currently using CPAP again.  Send 30-day  compliance report to myself or PCP.  Follow-up with PCP at some point in near future for hypertension.  FMLA form updated.  PDMP not reviewed this encounter. No orders of the defined types were placed in this encounter.  No orders of the defined types were placed in this encounter.    Historical information moved to improve visibility of documentation.  Past Medical History:  Diagnosis Date  . Lupus ([place])   . Personal history of amenorrhea   . Sinusitis    Past Surgical History:  Procedure Laterality Date  . DILATION AND CURETTAGE OF UTERUS    . TONSILLECTOMY     Social History   Tobacco Use  . Smoking status: Never Smoker  . Smokeless tobacco: Never Used  Substance Use Topics  . Alcohol use: No   family history includes Cancer in her mother; Diabetes in her brother; Hypertension in her father.  Medications: Current Outpatient Medications  Medication Sig Dispense Refill  . AMBULATORY NON FORMULARY MEDICATION Continuous positive airway pressure (CPAP) machine auto-titrate from 4-20 cm of H2O pressure, with all supplemental supplies as needed.  Send 30 day report to Dr Jennifer Erickson [phone] after 30 days 1 each 0  . celecoxib (CELEBREX) 200 MG capsule Take 1 capsule (200 mg total) by mouth 2 (two) times daily as needed for moderate pain. 60 capsule 2  . diclofenac sodium (VOLTAREN) 1 % GEL Apply 4 g topically 4 (four) times daily. To affected joint. 100 g 11  . EPINEPHrine (EPIPEN 2-PAK) 0.3 mg/0.3  mL IJ SOAJ injection INJECT 0.3 MLS (0.3 MG TOTAL) INTO THE MUSCLE ONCE. 1 Device 4  . etonogestrel (IMPLANON) 68 MG IMPL implant Inject 1 each into the skin once.    . hydroxychloroquine (PLAQUENIL) 200 MG tablet Take 200 mg by mouth 2 (two) times daily.  3   No current facility-administered medications for this visit.    Allergies  Allergen Reactions  . Aspirin Other (See Comments)    GI issues  . Augmentin [Amoxicillin-Pot Clavulanate] Diarrhea  . Ciprofloxacin      Nausea and vomiting.   [name] Hydrocodone     Cough syrup/nausea  . Ivp Dye [Iodinated Diagnostic Agents]      Discussed warning signs or symptoms. Please see discharge instructions. Patient expresses understanding.

## 2018-04-06 ENCOUNTER — Encounter: Payer: Self-pay | Admitting: Sports Medicine

## 2018-04-06 ENCOUNTER — Ambulatory Visit (INDEPENDENT_AMBULATORY_CARE_PROVIDER_SITE_OTHER): Payer: Federal, State, Local not specified - PPO

## 2018-04-06 ENCOUNTER — Ambulatory Visit (INDEPENDENT_AMBULATORY_CARE_PROVIDER_SITE_OTHER): Payer: Federal, State, Local not specified - PPO | Admitting: Sports Medicine

## 2018-04-06 DIAGNOSIS — R609 Edema, unspecified: Secondary | ICD-10-CM | POA: Diagnosis not present

## 2018-04-06 DIAGNOSIS — T783XXA Angioneurotic edema, initial encounter: Secondary | ICD-10-CM

## 2018-04-06 DIAGNOSIS — K1379 Other lesions of oral mucosa: Secondary | ICD-10-CM | POA: Diagnosis not present

## 2018-04-06 MED ORDER — PREDNISONE 50 MG PO TABS: ORAL_TABLET | ORAL | 0 refills | Status: DC

## 2018-04-06 MED ORDER — METHYLPREDNISOLONE SODIUM SUCC 125 MG IJ SOLR
125.0000 mg | Freq: Once | INTRAMUSCULAR | Status: AC
  Administered 2018-04-06: 125 mg via INTRAMUSCULAR

## 2018-04-06 NOTE — Progress Notes (Addendum)
Subjective:    CC: Uvular swelling  HPI: This is a pleasant 48 year old female, she has occasional episodes of isolated and intermittent uvular and posterior [place] swelling and edema that makes it difficult for her to sleep and worsens her sleep apnea symptoms.  The symptoms are mild, intermittent.  No new medications, no new foods, no constitutional symptoms, minimal soreness and itch.  I reviewed the past medical history, family history, social history, surgical history, and allergies today and no changes were needed.  Please see the problem list section below in epic for further details.  Past Medical History: Past Medical History:  Diagnosis Date  . Lupus ([place])   . Personal history of amenorrhea   . Sinusitis    Past Surgical History: Past Surgical History:  Procedure Laterality Date  . DILATION AND CURETTAGE OF UTERUS    . TONSILLECTOMY     Social History: Social History   Socioeconomic History  . Marital status: Widowed    Spouse name: Not on file  . Number of children: Not on file  . Years of education: Not on file  . Highest education level: Not on file  Occupational History  . Not on file  Social Needs  . Financial resource strain: Not on file  . Food insecurity:    Worry: Not on file    Inability: Not on file  . Transportation needs:    Medical: Not on file    Non-medical: Not on file  Tobacco Use  . Smoking status: Never Smoker  . Smokeless tobacco: Never Used  Substance and Sexual Activity  . Alcohol use: No  . Drug use: No  . Sexual activity: Not Currently    Birth control/protection: Pill    Comment: 1st intercourse- 19, partners-1, widow  Lifestyle  . Physical activity:    Days per week: Not on file    Minutes per session: Not on file  . Stress: Not on file  Relationships  . Social connections:    Talks on phone: Not on file    Gets together: Not on file    Attends religious service: Not on file    Active member of club or organization:  Not on file    Attends meetings of clubs or organizations: Not on file    Relationship status: Not on file  Other Topics Concern  . Not on file  Social History Narrative  . Not on file   Family History: Family History  Problem Relation Age of Onset  . Cancer Mother        brain  . Hypertension Father   . Diabetes Brother    Allergies: Allergies  Allergen Reactions  . Aspirin Other (See Comments)    GI issues  . Augmentin [Amoxicillin-Pot Clavulanate] Diarrhea  . Ciprofloxacin     Nausea and vomiting.   [name] Hydrocodone     Cough syrup/nausea  . Ivp Dye [Iodinated Diagnostic Agents]    Medications: See med rec.  Review of Systems: No fevers, chills, night sweats, weight loss, chest pain, or shortness of breath.   Objective:    General: Well Developed, well nourished, and in no acute distress.  Neuro: Alert and oriented x3, extra-ocular muscles intact, sensation grossly intact.  HEENT: Normocephalic, atraumatic, pupils equal round reactive to light, neck supple, no masses, no lymphadenopathy, thyroid nonpalpable.  There is moderate uvular edema, mostly on the right side. Skin: Warm and dry, no rashes. Cardiac: Regular rate and rhythm, no murmurs rubs or gallops, no lower  extremity edema.  Respiratory: Clear to auscultation bilaterally. Not using accessory muscles, speaking in full sentences.    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 This is often best treated with steroids, Solu-Medrol 125 intramuscular. Adding a 5-day burst of prednisone. I would like a lateral neck film looking for any evidence of concomittent epiglottitis. I would like her to squirt Afrin at her uvula 3 times daily. Return to see me if no better in a few days. ___________________________________________ Gwen Her. Dianah Field, M.D., ABFM., CAQSM. Primary Care and Sports Medicine [place]  [facility]  Adjunct Professor of [employer]

## 2018-04-06 NOTE — Patient Instructions (Signed)
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 This is often best treated with steroids, Solu-Medrol 125 intramuscular. Adding a 5-day burst of prednisone. I would like her to squirt Afrin at her uvula 3 times daily. Return to see me if no better in a few days.

## 2018-04-06 NOTE — Addendum Note (Signed)
Addended by: Silverio Decamp on: 04/06/2018 11:39 AM   Modules accepted: Orders

## 2018-04-06 NOTE — Assessment & Plan Note (Addendum)
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 This is often best treated with steroids, Solu-Medrol 125 intramuscular. Adding a 5-day burst of prednisone. I would like a lateral neck film looking for any evidence of concomittent epiglottitis. I would like her to squirt Afrin at her uvula 3 times daily. Return to see me if no better in a few days.

## 2018-04-19 MED FILL — HYDROXYCHLOROQUINE SULFATE: 200 | 30 days supply | Qty: 60 | Fill #3

## 2018-04-21 ENCOUNTER — Encounter: Payer: Self-pay | Admitting: Emergency Medicine

## 2018-04-21 ENCOUNTER — Telehealth: Payer: Self-pay | Admitting: Emergency Medicine

## 2018-04-21 ENCOUNTER — Emergency Department
Admission: EM | Admit: 2018-04-21 | Discharge: 2018-04-21 | Disposition: A | Discharge status: Home / Self Care | Payer: Federal, State, Local not specified - PPO | Source: Home / Self Care | Attending: Emergency Medicine | Admitting: Emergency Medicine

## 2018-04-21 DIAGNOSIS — L03311 Cellulitis of abdominal wall: Secondary | ICD-10-CM | POA: Diagnosis not present

## 2018-04-21 MED ORDER — MUPIROCIN CALCIUM 2 % EX CREA: 1.0000 "application " | TOPICAL_CREAM | Freq: Three times a day (TID) | CUTANEOUS | 0 refills | Status: DC

## 2018-04-21 MED ORDER — DOXYCYCLINE HYCLATE 100 MG PO CAPS: 100.0000 mg | ORAL_CAPSULE | Freq: Two times a day (BID) | ORAL | 0 refills | Status: DC

## 2018-04-21 MED FILL — MUPIROCIN 2% OINTMENT: 2 | 7 days supply | Qty: 22 | Fill #0

## 2018-04-21 MED FILL — DOXYCYCLINE HYCLATE 100 MG: 100 | 10 days supply | Qty: 20 | Fill #0

## 2018-04-21 NOTE — ED Triage Notes (Signed)
Pt c/o small sore on stomach that she noticed about 8 days ago. Getting worse now. She has been using abx cream.

## 2018-04-21 NOTE — ED Provider Notes (Signed)
[name]    CSN: [id number] Arrival date & time: 04/21/18  1047     History   Chief Complaint Chief Complaint  Patient presents with  . Abscess    HPI Jennifer Erickson is a 48 y.o. female.   HPI Pt c/o small sore on stomach that she noticed about 8 days ago.  She scratched it and it drained small amount of fluid.  Denies fluctuance or any more fluid drainage since. She denies pain.   For the past several days, noted more redness and mild swelling around the healing wound.   She has been using abx cream. Denies fever or chills or nausea or vomiting or abdominal pain or chest pain or shortness of breath. Denies GU symptoms.  No nausea or vomiting or abdominal pain or diarrhea or urinary symptoms.. 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  She does not recall prior skin lesions on abdomen. No LMP recorded. Patient has had an implant. She denies chance of pregnancy.  Past Medical History:  Diagnosis Date  . Lupus ([place])   . Personal history of amenorrhea   . Sinusitis   Reviewed past medical history  Patient Active Problem List   Diagnosis Date Noted  . 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 Elevated blood pressure reading 09/24/2017  . 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 Palpitations 07/11/2015  . OSA on CPAP 07/10/2015  . Large breasts 05/28/2015  . Primary osteoarthritis of both knees 12/08/2014  . Vitamin D deficiency 04/19/2014  . Bilateral swelling of feet 05/02/2013  . Left breast mass 11/22/2012  . Obstructive sleep apnea 11/22/2012  . Plantar fasciitis, bilateral 11/01/2012  . Insomnia 12/30/2011  . Obesity, Class III, BMI 40-49.9 (morbid obesity) ([place]) 08/19/2011    Past Surgical History:  Procedure Laterality Date  . DILATION AND CURETTAGE OF UTERUS    . TONSILLECTOMY      OB History      Gravida  0   Para  0   Term  0   Preterm  0   AB  0   Living  0     SAB  0   TAB  0   Ectopic  0   Multiple  0   Live Births  0            Home Medications    Prior to Admission medications   Medication Sig Start Date End Date Taking? Authorizing Provider  AMBULATORY NON FORMULARY MEDICATION Continuous positive airway pressure (CPAP) machine auto-titrate from 4-20 cm of H2O pressure, with all supplemental supplies as needed.  Send 30 day report to Dr Georgina Snell [phone] after 30 days 03/11/18   Gregor Hams, MD  diclofenac sodium (VOLTAREN) 1 % GEL Apply 4 g topically 4 (four) times daily. To affected joint. 03/11/18   Gregor Hams, MD  doxycycline (VIBRAMYCIN) 100 MG capsule Take 1 capsule (100 mg total) by mouth 2 (two) times daily. 04/21/18   Jacqulyn Cane, MD  EPINEPHrine (EPIPEN 2-PAK) 0.3 mg/0.3 mL IJ SOAJ injection INJECT 0.3 MLS (0.3 MG TOTAL) INTO THE MUSCLE ONCE. 10/28/16   Trixie Dredge, PA-C  etonogestrel (IMPLANON) 68 MG IMPL implant Inject 1 each into the skin once.    [provider]  hydroxychloroquine (PLAQUENIL) 200 MG tablet Take 200 mg by mouth 2 (two) times daily. 12/03/17   [provider]  mupirocin cream (BACTROBAN) 2 % Apply 1 application topically 3 (three) times daily. For 5 days 04/21/18   Jacqulyn Cane, MD    Family History Family History  Problem Relation Age of Onset  . Cancer Mother        brain  . Hypertension Father   . Diabetes Brother     Social History Social History   Tobacco Use  . Smoking status: Never Smoker  . Smokeless tobacco: Never Used  Substance Use Topics  . Alcohol use: No  . Drug use: No   Does not smoke or use tobacco or drugs.  Allergies   Aspirin; Augmentin [amoxicillin-pot clavulanate]; Ciprofloxacin; Hydrocodone; and Ivp dye [iodinated diagnostic agents]   Review of Systems Review of Systems  All other systems reviewed and are negative.   Pertinent items  noted in HPI and remainder of comprehensive ROS otherwise negative. Physical Exam Triage Vital Signs ED Triage Vitals [04/21/18 1111]  Enc Vitals Group     BP 123/86     Pulse Rate 86     Resp      Temp 98.2 F (36.8 C)     Temp Source Oral     SpO2      Weight 280 lb (127 kg)     Height      Head Circumference      Peak Flow      Pain Score 0     Pain Loc      Pain Edu?      Excl. in [place]?    No data found.  Updated Vital Signs BP 123/86 (BP Location: Right Arm)   Pulse 86   Temp 98.2 F (36.8 C) (Oral)   Wt 127 kg   BMI 45.19 kg/m    Physical Exam Vitals signs reviewed.  Constitutional:      General: She is not in acute distress.    Appearance: She is well-developed.  HENT:     Head: Normocephalic and atraumatic.  Eyes:     General: No scleral icterus.    Pupils: Pupils are equal, round, and reactive to light.  Neck:     Musculoskeletal: Normal range of motion and neck supple.  Cardiovascular:     Rate and Rhythm: Normal rate and regular rhythm.  Pulmonary:     Effort: Pulmonary effort is normal.  Abdominal:     General: There is no distension.  Skin:    General: Skin is warm and dry.       Neurological:     Mental Status: She is alert and oriented to person, place, and time.     Cranial Nerves: No cranial nerve deficit.  Psychiatric:        Behavior: Behavior normal.      UC Treatments / Results  Labs (all labs ordered are listed, but only abnormal results are displayed) Labs Reviewed - No data to display  EKG None  Radiology No results found.  Procedures Procedures (including critical care time)  Medications Ordered in UC Medications - No data to display  Initial Impression / Assessment and Plan / UC Course  I have reviewed the triage vital signs and the nursing notes.  Pertinent labs & imaging results that were available during my care of the patient were reviewed by me and considered in my medical decision making (see chart for  details).      Final Clinical Impressions(s) / UC Diagnoses   Final diagnoses:  Cellulitis of abdominal wall  On  physical exam, no sign of fluctuance or abscess. The area is nontender.  Clinically, this is cellulitis of abdominal wall.  Treatment options discussed, as well as risks, benefits, alternatives. Patient voiced understanding and agreement with the following plans:    Discharge Instructions     You have a skin infection of abdominal wall.  No sign of abscess. The treatment is antibiotic.  I sent a prescription for doxycycline to your pharmacy.  Take twice a day for 10 days. Also prescribing topical antibiotic, apply 2 or 3 times a day. Also, keep area clean and dry. If any severe worsening symptoms or any drainage, return here or emergency room or your PCP, as this might be a sign of abscess which would require incision and drainage. Please read attached instructions on cellulitis.    ED Prescriptions    Medication Sig Dispense Auth. Provider   doxycycline (VIBRAMYCIN) 100 MG capsule Take 1 capsule (100 mg total) by mouth 2 (two) times daily. 20 capsule Jacqulyn Cane, MD   mupirocin ointment (BACTROBAN) 2 % Apply 1 application topically 3 (three) times daily. For 5 days 15 g Jacqulyn Cane, MD     Follow-up with your primary care doctor in 5-7 days if not improving, or sooner if symptoms become worse. Precautions discussed. Red flags discussed. Questions invited and answered. Patient voiced understanding and agreement.    Jacqulyn Cane, MD 04/21/18 1159

## 2018-04-21 NOTE — Discharge Instructions (Addendum)
You have a skin infection of abdominal wall.  No sign of abscess. The treatment is antibiotic.  I sent a prescription for doxycycline to your pharmacy.  Take twice a day for 10 days. Also prescribing topical antibiotic, apply 2 or 3 times a day. Also, keep area clean and dry. If any severe worsening symptoms or any drainage, return here or emergency room or your PCP, as this might be a sign of abscess which would require incision and drainage. Please read attached instructions on cellulitis.

## 2018-04-25 DIAGNOSIS — G4733 Obstructive sleep apnea (adult) (pediatric): Secondary | ICD-10-CM | POA: Diagnosis not present

## 2018-04-27 DIAGNOSIS — H40013 Open angle with borderline findings, low risk, bilateral: Secondary | ICD-10-CM | POA: Diagnosis not present

## 2018-05-10 ENCOUNTER — Ambulatory Visit (INDEPENDENT_AMBULATORY_CARE_PROVIDER_SITE_OTHER): Payer: Federal, State, Local not specified - PPO | Admitting: Family Medicine

## 2018-05-10 VITALS — BP 148/92 | HR 81 | Ht 66.0 in | Wt 283.0 lb

## 2018-05-10 DIAGNOSIS — T783XXD Angioneurotic edema, subsequent encounter: Secondary | ICD-10-CM | POA: Diagnosis not present

## 2018-05-10 DIAGNOSIS — M7712 Lateral epicondylitis, left elbow: Secondary | ICD-10-CM

## 2018-05-10 NOTE — Progress Notes (Signed)
Jennifer Erickson is a 48 y.o. female who presents to [facility] today for left forearm pain. Jennifer Erickson has a history of left tennis elbow. Since her last visit on 01/06, she has been occasionally doing the home exercises. Diclofenac does not alleviate her symptoms.   For the past four weeks, she has had left forearm pain along the wrist extensor muscles. She has difficulty doing anything that requires strength of her wrist, such as buckling her seatbelt. Pain is worse with wrist extension grip. She is right handed, so the pain in her left arm is not very limiting to her daily activities. Denies numbness and weakness.  .   Angioedema: Jennifer Erickson had a recent episode of angioedema. Normally when this happens, she states that she drinks a "hot liquid" that helps the swelling go down in her throat. During her episode in early January, the swelling continued despite drinking hot liquids. Dr. Dianah Field saw her and gave her an IM steroid injection as well as 5 day burst of prednisone. She does not take any antihistamines regularly.   ROS:  As above  Exam:  BP (!) 148/92   Pulse 81   Ht 5' 6"  (1.676 m)   Wt 283 lb (128.4 kg)   BMI 45.68 kg/m  Wt Readings from Last 5 Encounters:  05/10/18 283 lb (128.4 kg)  04/21/18 280 lb (127 kg)  04/06/18 277 lb (125.6 kg)  04/05/18 279 lb (126.6 kg)  03/11/18 278 lb (126.1 kg)   General: Well Developed, well nourished, and in no acute distress.  Neuro/Psych: Alert and oriented x3, extra-ocular muscles intact, able to move all 4 extremities, sensation grossly intact. Skin: Warm and dry, no rashes noted.  Respiratory: Not using accessory muscles, speaking in full sentences, trachea midline.  Cardiovascular: Pulses palpable, no extremity edema. Abdomen: Does not appear distended. MSK:  Left arm/hand: Normal appearance. No erythema, edema, or rashes.  Tender to palpation over left lateral  epicondyle. Normal ROM. Pain with full extension of elbow and wrist.  Strength 5/5 with resisted elbow extension and flexion. Strength 5/5 with resisted wrist extension and flexion. Strength 4/5 with resisted finger abduction.  Negative Maudsley's test. Positive Cozen's test.  Pain and weakness with resisted extension of fingers.  Pulses and capillary refill normal.  Right arm/hand:  Normal appearance. No erythema, edema, or rashes.  No tenderness to palpation. Normal ROM.   Strength 5/5 with resisted elbow extension and flexion. Strength 5/5 with resisted wrist extension and flexion. Strength 5/5 with resisted finger abduction.  Pulses and capillary refill normal.     Assessment and Plan: 48 y.o. female with  Left tennis elbow: Jennifer Erickson has had pain in her left forearm along the wrist extensor muscles for the past four weeks. On exam, she had pain and weakness with resisted extension of fingers and a positive Cozen's test. This could be worsening of left tennis elbow or radial tunnel syndrome. Less likely to be radial tunnel syndrome as she doesn't describe the "toothache" pain in her forearm. Most likely worsening of left tennis elbow, as she hasn't regularly been doing her home exercises and her exam demonstrates pain and weakness of the wrist extensor muscles. Discussed options for injection or PT. She would like to wait at this time to see if it resolves on its own. Demonstrated exercises that she should do at home.   Angioedema: She had a recent episode of angioedema in early January. Recommended taking zyrtec daily to prevent recurrence.  Discussed proper use of an EpiPen and that she should go to the hospital if she has to use the EpiPen. Consider referral to allergist if not improving.    PDMP not reviewed this encounter. No orders of the defined types were placed in this encounter.  No orders of the defined types were placed in this encounter.   Historical information moved  to improve visibility of documentation.  Past Medical History:  Diagnosis Date  . Lupus ([place])   . Personal history of amenorrhea   . Sinusitis    Past Surgical History:  Procedure Laterality Date  . DILATION AND CURETTAGE OF UTERUS    . TONSILLECTOMY     Social History   Tobacco Use  . Smoking status: Never Smoker  . Smokeless tobacco: Never Used  Substance Use Topics  . Alcohol use: No   family history includes Cancer in her mother; Diabetes in her brother; Hypertension in her father.  Medications: Current Outpatient Medications  Medication Sig Dispense Refill  . AMBULATORY NON FORMULARY MEDICATION Continuous positive airway pressure (CPAP) machine auto-titrate from 4-20 cm of H2O pressure, with all supplemental supplies as needed.  Send 30 day report to Dr Georgina Snell [phone] after 30 days 1 each 0  . diclofenac sodium (VOLTAREN) 1 % GEL Apply 4 g topically 4 (four) times daily. To affected joint. 100 g 11  . doxycycline (VIBRAMYCIN) 100 MG capsule Take 1 capsule (100 mg total) by mouth 2 (two) times daily. 20 capsule 0  . EPINEPHrine (EPIPEN 2-PAK) 0.3 mg/0.3 mL IJ SOAJ injection INJECT 0.3 MLS (0.3 MG TOTAL) INTO THE MUSCLE ONCE. 1 Device 4  . etonogestrel (IMPLANON) 68 MG IMPL implant Inject 1 each into the skin once.    . hydroxychloroquine (PLAQUENIL) 200 MG tablet Take 200 mg by mouth 2 (two) times daily.  3  . mupirocin cream (BACTROBAN) 2 % Apply 1 application topically 3 (three) times daily. For 5 days 15 g 0   No current facility-administered medications for this visit.    Allergies  Allergen Reactions  . Aspirin Other (See Comments)    GI issues  . Augmentin [Amoxicillin-Pot Clavulanate] Diarrhea  . Ciprofloxacin     Nausea and vomiting.   [name] Hydrocodone     Cough syrup/nausea  . Ivp Dye [Iodinated Diagnostic Agents]       Discussed warning signs or symptoms. Please see discharge instructions. Patient expresses understanding.  I personally was present  and performed or re-performed the history, physical exam and medical decision-making activities of this service and have verified that the service and findings are accurately documented in the student's note. ___________________________________________ Lynne Leader M.D., ABFM., CAQSM. Primary Care and Sports Medicine Adjunct Instructor of [employer]

## 2018-05-10 NOTE — Patient Instructions (Addendum)
Thank you for coming in today. For tennis elbow work on the stretch and the wrist up to down slowly exercise as much as you can.   If not better we can do hand therapy or injection or plan for surgery.   For the swelling take certizine (zertec) daily or twice daily to prevent in.   If still happening consider allergist referral.   Use epi pen if bad then go to the hospital.    Tennis Elbow Rehab Ask your health care provider which exercises are safe for you. Do exercises exactly as told by your health care provider and adjust them as directed. It is normal to feel mild stretching, pulling, tightness, or discomfort as you do these exercises, but you should stop right away if you feel sudden pain or your pain gets worse. Do not begin these exercises until told by your health care provider. Stretching and range of motion exercises These exercises warm up your muscles and joints and improve the movement and flexibility of your elbow. These exercises also help to relieve pain, numbness, and tingling. Exercise A: Wrist extensor stretch 1. Extend your left / right elbow with your fingers pointing down. 2. Gently pull the palm of your left / right hand toward you until you feel a gentle stretch on the top of your forearm. 3. To increase the stretch, push your left / right hand toward the outer edge or pinkie side of your forearm. 4. Hold this position for __________ seconds. Repeat __________ times. Complete this exercise __________ times a day. If directed by your health care provider, repeat this stretch except do it with a bent elbow this time. Exercise B: Wrist flexor stretch  1. Extend your left / right elbow and turn your palm upward. 2. Gently pull your left / right palm and fingertips back so your wrist extends and your fingers point more toward the ground. 3. You should feel a gentle stretch on the inside of your forearm. 4. Hold this position for __________ seconds. Repeat __________  times. Complete this exercise __________ times a day. If directed by your health care provider, repeat this stretch except do it with a bent elbow this time. Strengthening exercises These exercises build strength and endurance in your elbow. Endurance is the ability to use your muscles for a long time, even after they get tired. Exercise C: Wrist extensors  1. Sit with your left / right forearm palm-down and fully supported on a table or countertop. Your elbow should be resting below the height of your shoulder. 2. Let your left / right wrist extend over the edge of the surface. 3. Loosely hold a __________ weight or a piece of rubber exercise band or tubing in your left / right hand. Slowly curl your left / right hand up toward your forearm. If you are using band or tubing, hold the band or tubing in place with your other hand to provide resistance. 4. Hold this position for __________ seconds. 5. Slowly return to the starting position. Repeat __________ times. Complete this exercise __________ times a day. Exercise D: Radial deviators  1. Stand with a __________ weight in your left / righthand. Or, sit while holding a rubber exercise band or tubing with your other arm supported on a table or countertop. Position your hand so your thumb is on top. 2. Raise your hand upward in front of you so your thumb travels toward your forearm, or pull up on the rubber tubing. 3. Hold this position  for __________ seconds. 4. Slowly return to the starting position. Repeat __________ times. Complete this exercise __________ times a day. Exercise E: Eccentric wrist extensors 1. Sit with your left / right forearm palm-down and fully supported on a table or countertop. Your elbow should be resting below the height of your shoulder. 2. If told by your health care provider, hold a __________ weight in your hand. 3. Let your left / right wrist extend over the edge of the surface. 4. Use your other hand to lift up  your left / right hand toward your forearm. Keep your forearm on the table. 5. Using only the muscles in your left / right hand, slowly lower your hand back down to the starting position. Repeat __________ times. Complete this exercise __________ times a day. This information is not intended to replace advice given to you by your health care provider. Make sure you discuss any questions you have with your health care provider. Document Released: 03/17/2005 Document Revised: 11/21/2015 Document Reviewed: 12/14/2014 Elsevier Interactive Patient Education  2019 [employer].

## 2018-05-12 ENCOUNTER — Ambulatory Visit (INDEPENDENT_AMBULATORY_CARE_PROVIDER_SITE_OTHER): Payer: Federal, State, Local not specified - PPO | Admitting: Physician Assistant

## 2018-05-12 ENCOUNTER — Encounter: Payer: Self-pay | Admitting: Physician Assistant

## 2018-05-12 VITALS — BP 124/82 | HR 88 | Ht 66.0 in | Wt 276.0 lb

## 2018-05-12 DIAGNOSIS — Z79899 Other long term (current) drug therapy: Secondary | ICD-10-CM

## 2018-05-12 DIAGNOSIS — Z13220 Encounter for screening for lipoid disorders: Secondary | ICD-10-CM

## 2018-05-12 DIAGNOSIS — E559 Vitamin D deficiency, unspecified: Secondary | ICD-10-CM

## 2018-05-12 DIAGNOSIS — Z0000 Encounter for general adult medical examination without abnormal findings: Secondary | ICD-10-CM | POA: Diagnosis not present

## 2018-05-12 DIAGNOSIS — R7301 Impaired fasting glucose: Secondary | ICD-10-CM | POA: Diagnosis not present

## 2018-05-12 NOTE — Progress Notes (Signed)
Subjective:     Jennifer Erickson is a 48 y.o. female and is here for a comprehensive physical exam. The patient reports no problems.  Social History   Socioeconomic History  . Marital status: Widowed    Spouse name: Not on file  . Number of children: Not on file  . Years of education: Not on file  . Highest education level: Not on file  Occupational History  . Not on file  Social Needs  . Financial resource strain: Not on file  . Food insecurity:    Worry: Not on file    Inability: Not on file  . Transportation needs:    Medical: Not on file    Non-medical: Not on file  Tobacco Use  . Smoking status: Never Smoker  . Smokeless tobacco: Never Used  Substance and Sexual Activity  . Alcohol use: No  . Drug use: No  . Sexual activity: Not Currently    Birth control/protection: Pill    Comment: 1st intercourse- 19, partners-1, widow  Lifestyle  . Physical activity:    Days per week: Not on file    Minutes per session: Not on file  . Stress: Not on file  Relationships  . Social connections:    Talks on phone: Not on file    Gets together: Not on file    Attends religious service: Not on file    Active member of club or organization: Not on file    Attends meetings of clubs or organizations: Not on file    Relationship status: Not on file  . Intimate partner violence:    Fear of current or ex partner: Not on file    Emotionally abused: Not on file    Physically abused: Not on file    Forced sexual activity: Not on file  Other Topics Concern  . Not on file  Social History Narrative  . Not on file   Health Maintenance  Topic Date Due  . HIV Screening  07/01/2026 (Originally 11/11/1985)  . MAMMOGRAM  08/12/2018  . TETANUS/TDAP  09/08/2018  . PAP SMEAR-Modifier  11/24/2020  . INFLUENZA VACCINE  Completed    The following portions of the patient's history were reviewed and updated as appropriate: allergies, current medications, past family history, past medical  history, past social history, past surgical history and problem list.  Review of Systems A comprehensive review of systems was negative.   Objective:    BP 124/82   Pulse 88   Ht 5' 6"  (1.676 m)   Wt 276 lb (125.2 kg)   BMI 44.55 kg/m  General appearance: alert, cooperative, appears stated age and morbidly obese Head: Normocephalic, without obvious abnormality, atraumatic Eyes: conjunctivae/corneas clear. PERRL, EOM's intact. Fundi benign. Ears: normal TM's and external ear canals both ears Nose: Nares normal. Septum midline. Mucosa normal. No drainage or sinus tenderness. Throat: lips, mucosa, and tongue normal; teeth and gums normal Neck: no adenopathy, no carotid bruit, no JVD, supple, symmetrical, trachea midline and thyroid not enlarged, symmetric, no tenderness/mass/nodules Back: symmetric, no curvature. ROM normal. No CVA tenderness. Lungs: clear to auscultation bilaterally Heart: regular rate and rhythm, S1, S2 normal, no murmur, click, rub or gallop Abdomen: soft, non-tender; bowel sounds normal; no masses,  no organomegaly Extremities: extremities normal, atraumatic, no cyanosis or edema Pulses: 2+ and symmetric Skin: Skin color, texture, turgor normal. No rashes or lesions Lymph nodes: Cervical, supraclavicular, and axillary nodes normal. Neurologic: Alert and oriented X 3, normal strength and tone. Normal  symmetric reflexes. Normal coordination and gait    Assessment:    Healthy female exam.      Plan:    Marland KitchenMarland KitchenHaillee was seen today for annual exam.  Diagnoses and all orders for this visit:  Routine physical examination -     Hemoglobin A1c -     Vitamin D 1,25 dihydroxy -     COMPLETE METABOLIC PANEL WITH GFR -     Lipid Panel w/reflex Direct LDL  Elevated fasting glucose -     Hemoglobin A1c  Vitamin D deficiency -     Vitamin D 1,25 dihydroxy  Medication management -     COMPLETE METABOLIC PANEL WITH GFR  Screening for lipid disorders -     Lipid  Panel w/reflex Direct LDL   .[name] Depression screen [facility] 2/9 05/12/2018 07/10/2017 01/21/2017 12/03/2016 10/28/2016  Decreased Interest 0 0 0 0 0  Down, Depressed, Hopeless 0 0 0 0 0  PHQ - 2 Score 0 0 0 0 0  Altered sleeping - - 1 - -  Tired, decreased energy - - 1 - -  Change in appetite - - 0 - -  Feeling bad or failure about yourself  - - 0 - -  Trouble concentrating - - 0 - -  Moving slowly or fidgety/restless - - 0 - -  Suicidal thoughts - - 0 - -  PHQ-9 Score - - 2 - -  Difficult doing work/chores - - Not difficult at all - -   .[name] Discussed 150 minutes of exercise a week.  Encouraged vitamin D 1000 units and Calcium 1339m or 4 servings of dairy a day.  Mammogram and pap up to date.  Declined STI testing.  Fasting labs ordered.  Vaccines up to date.   .Marland KitchenDiscussed low carb diet with 1500 calories and 80g of protein.  Exercising at least 150 minutes a week.  My Fitness Pal could be a gMicrobiologist   [facility] continue to work with CPAP company to get CPAP where you can use it.  See After Visit Summary for Counseling Recommendations

## 2018-05-12 NOTE — Patient Instructions (Signed)

## 2018-05-14 NOTE — Progress Notes (Signed)
Call pt: Jennifer Erickson is stable at 6. Remains in pre-diabetic range. Keep working on low sugars and carbs diet and stay active. We could consider metformin to help keep sugars down and could even help you lose weight. Would you like to consider?   Kidney and liver look good.  Globulin elevated but like due to lupus.   Cholesterol looks fabulous.

## 2018-05-16 LAB — COMPLETE METABOLIC PANEL WITH GFR
AG Ratio: 0.9 (calc) — ABNORMAL LOW (ref 1.0–2.5)
ALT: 21 U/L (ref 6–29)
AST: 19 U/L (ref 10–35)
Albumin: 3.6 g/dL (ref 3.6–5.1)
Alkaline phosphatase (APISO): 94 U/L (ref 31–125)
BUN: 11 mg/dL (ref 7–25)
CO2: 25 mmol/L (ref 20–32)
Calcium: 9.3 mg/dL (ref 8.6–10.2)
Chloride: 106 mmol/L (ref 98–110)
Creat: 0.81 mg/dL (ref 0.50–1.10)
GFR, Est African American: 100 mL/min/{1.73_m2} (ref >=60)
GFR, Est Non African American: 86 mL/min/{1.73_m2} (ref >=60)
Globulin: 4 g/dL (calc) — ABNORMAL HIGH (ref 1.9–3.7)
Glucose, Bld: 92 mg/dL (ref 65–99)
Potassium: 4 mmol/L (ref 3.5–5.3)
Sodium: 139 mmol/L (ref 135–146)
Total Bilirubin: 0.4 mg/dL (ref 0.2–1.2)
Total Protein: 7.6 g/dL (ref 6.1–8.1)

## 2018-05-16 LAB — LIPID PANEL W/REFLEX DIRECT LDL
Cholesterol: 167 mg/dL (ref <200)
HDL: 51 mg/dL (ref >=50)
LDL Cholesterol (Calc): 102 mg/dL (calc) — ABNORMAL HIGH
Non-HDL Cholesterol (Calc): 116 mg/dL (calc) (ref <130)
Total CHOL/HDL Ratio: 3.3 (calc) (ref <5.0)
Triglycerides: 62 mg/dL (ref <150)

## 2018-05-16 LAB — HEMOGLOBIN A1C
Hgb A1c MFr Bld: 6 % of total Hgb — ABNORMAL HIGH (ref <5.7)
Mean Plasma Glucose: 126 (calc)
eAG (mmol/L): 7 (calc)

## 2018-05-16 LAB — VITAMIN D 1,25 DIHYDROXY
Vitamin D 1, 25 (OH)2 Total: 46 pg/mL (ref 18–72)
Vitamin D2 1, 25 (OH)2: <8 pg/mL
Vitamin D3 1, 25 (OH)2: 46 pg/mL

## 2018-05-17 ENCOUNTER — Ambulatory Visit: Payer: Self-pay | Admitting: Family Medicine

## 2018-05-17 NOTE — Progress Notes (Signed)
Ok to send letter

## 2018-05-26 DIAGNOSIS — G4733 Obstructive sleep apnea (adult) (pediatric): Secondary | ICD-10-CM | POA: Diagnosis not present

## 2018-05-26 MED FILL — HYDROXYCHLOROQUINE SULFATE: 200 | 30 days supply | Qty: 60 | Fill #0

## 2018-05-28 ENCOUNTER — Ambulatory Visit (INDEPENDENT_AMBULATORY_CARE_PROVIDER_SITE_OTHER): Payer: Federal, State, Local not specified - PPO | Admitting: Sports Medicine

## 2018-05-28 ENCOUNTER — Ambulatory Visit (INDEPENDENT_AMBULATORY_CARE_PROVIDER_SITE_OTHER): Payer: Federal, State, Local not specified - PPO

## 2018-05-28 DIAGNOSIS — M7712 Lateral epicondylitis, left elbow: Secondary | ICD-10-CM

## 2018-05-28 DIAGNOSIS — M170 Bilateral primary osteoarthritis of knee: Secondary | ICD-10-CM

## 2018-05-28 DIAGNOSIS — M1712 Unilateral primary osteoarthritis, left knee: Secondary | ICD-10-CM | POA: Diagnosis not present

## 2018-05-28 DIAGNOSIS — M1711 Unilateral primary osteoarthritis, right knee: Secondary | ICD-10-CM | POA: Diagnosis not present

## 2018-05-28 NOTE — Progress Notes (Signed)
Subjective:    CC: Bilateral knee pain  HPI: This is a pleasant 48 year old female, for the past several weeks she said increasing pain in the back of both knees, worse with prolonged weightbearing, flexion.  I have done injections almost 2-1/2 years ago.  No mechanical symptoms,, no constitutional symptoms.  Tennis elbow, left: Previous injection was in 2017, now having recurrence of pain, moderate, persistent, localized without radiation.  She is doing her rehab exercises without any improvement.  I reviewed the past medical history, family history, social history, surgical history, and allergies today and no changes were needed.  Please see the problem list section below in epic for further details.  Past Medical History: Past Medical History:  Diagnosis Date  . Lupus ([place])   . Personal history of amenorrhea   . Sinusitis    Past Surgical History: Past Surgical History:  Procedure Laterality Date  . DILATION AND CURETTAGE OF UTERUS    . TONSILLECTOMY     Social History: Social History   Socioeconomic History  . Marital status: Widowed    Spouse name: Not on file  . Number of children: Not on file  . Years of education: Not on file  . Highest education level: Not on file  Occupational History  . Not on file  Social Needs  . Financial resource strain: Not on file  . Food insecurity:    Worry: Not on file    Inability: Not on file  . Transportation needs:    Medical: Not on file    Non-medical: Not on file  Tobacco Use  . Smoking status: Never Smoker  . Smokeless tobacco: Never Used  Substance and Sexual Activity  . Alcohol use: No  . Drug use: No  . Sexual activity: Not Currently    Birth control/protection: Pill    Comment: 1st intercourse- 19, partners-1, widow  Lifestyle  . Physical activity:    Days per week: Not on file    Minutes per session: Not on file  . Stress: Not on file  Relationships  . Social connections:    Talks on phone: Not on file   Gets together: Not on file    Attends religious service: Not on file    Active member of club or organization: Not on file    Attends meetings of clubs or organizations: Not on file    Relationship status: Not on file  Other Topics Concern  . Not on file  Social History Narrative  . Not on file   Family History: Family History  Problem Relation Age of Onset  . Cancer Mother        brain  . Hypertension Father   . Diabetes Brother    Allergies: Allergies  Allergen Reactions  . Aspirin Other (See Comments)    GI issues  . Augmentin [Amoxicillin-Pot Clavulanate] Diarrhea  . Ciprofloxacin     Nausea and vomiting.   [name] Hydrocodone     Cough syrup/nausea  . Ivp Dye [Iodinated Diagnostic Agents]    Medications: See med rec.  Review of Systems: No fevers, chills, night sweats, weight loss, chest pain, or shortness of breath.   Objective:    General: Well Developed, well nourished, and in no acute distress.  Neuro: Alert and oriented x3, extra-ocular muscles intact, sensation grossly intact.  HEENT: Normocephalic, atraumatic, pupils equal round reactive to light, neck supple, no masses, no lymphadenopathy, thyroid nonpalpable.  Skin: Warm and dry, no rashes. Cardiac: Regular rate and rhythm, no murmurs  rubs or gallops, no lower extremity edema.  Respiratory: Clear to auscultation bilaterally. Not using accessory muscles, speaking in full sentences. Bilateral knees: Minimally swollen, tender to palpation of the posterior joint line bilaterally ROM normal in flexion and extension and lower leg rotation. Ligaments with solid consistent endpoints including ACL, PCL, LCL, MCL. Negative Mcmurray's and provocative meniscal tests. Non painful patellar compression. Patellar and quadriceps tendons unremarkable. Hamstring and quadriceps strength is normal.  Procedure: Real-time Ultrasound Guided injection of the left knee Device: GE Logiq E  Verbal informed consent obtained.    Time-out conducted.  Noted no overlying erythema, induration, or other signs of local infection.  Skin prepped in a sterile fashion.  Local anesthesia: Topical Ethyl chloride.  With sterile technique and under real time ultrasound guidance:  1 cc Kenalog 40, 2 cc lidocaine, 2 cc bupivacaine injected easily Completed without difficulty  Pain immediately resolved suggesting accurate placement of the medication.  Advised to call if fevers/chills, erythema, induration, drainage, or persistent bleeding.  Images permanently stored and available for review in the ultrasound unit.  Impression: Technically successful ultrasound guided injection.  Procedure: Real-time Ultrasound Guided injection of the right knee Device: GE Logiq E  Verbal informed consent obtained.  Time-out conducted.  Noted no overlying erythema, induration, or other signs of local infection.  Skin prepped in a sterile fashion.  Local anesthesia: Topical Ethyl chloride.  With sterile technique and under real time ultrasound guidance:  1 cc Kenalog 40, 2 cc lidocaine, 2 cc bupivacaine injected easily Completed without difficulty  Pain immediately resolved suggesting accurate placement of the medication.  Advised to call if fevers/chills, erythema, induration, drainage, or persistent bleeding.  Images permanently stored and available for review in the ultrasound unit.  Impression: Technically successful ultrasound guided injection.  Procedure: Diagnostic Ultrasound of left elbow Device: GE Logiq E  Findings: In the origin of the common extensor tendon Images permanently stored and available for review in the ultrasound unit.  Impression: Tennis elbow with partial width, partial-thickness tear of the common extensor tendon at the origin.  Impression and Recommendations:    Primary osteoarthritis of both knees Repeat bilateral injection, previous injection was in April 2018. Getting updated x-rays. Return in a  month.  Left tennis elbow Left lateral epicondylitis, improved considerably after injections, the most recent of which was almost 3 years ago. She has had no recurrence of pain but desires to avoid steroid injection. She is going to look into PRP percutaneous tenotomy. I did an ultrasound today that did show a persistent hypoechoic gap in the deep proximal common extensor tendon She plans to call her insurance company to see if 0232T is covered. ___________________________________________ Gwen Her. Dianah Field, M.D., ABFM., CAQSM. Primary Care and Sports Medicine [facility]  Adjunct Professor of [employer]

## 2018-05-28 NOTE — Assessment & Plan Note (Signed)
Repeat bilateral injection, previous injection was in April 2018. Getting updated x-rays. Return in a month.

## 2018-05-28 NOTE — Patient Instructions (Signed)
Call your insurance company to see if "[phone]" is covered.

## 2018-05-28 NOTE — Assessment & Plan Note (Addendum)
Left lateral epicondylitis, improved considerably after injections, the most recent of which was almost 3 years ago. She has had no recurrence of pain but desires to avoid steroid injection. She is going to look into PRP percutaneous tenotomy. I did an ultrasound today that did show a persistent hypoechoic gap in the deep proximal common extensor tendon She plans to call her insurance company to see if 0232T is covered.

## 2018-06-18 ENCOUNTER — Other Ambulatory Visit: Payer: Self-pay

## 2018-06-18 ENCOUNTER — Encounter: Payer: Self-pay | Admitting: Sports Medicine

## 2018-06-18 ENCOUNTER — Ambulatory Visit (INDEPENDENT_AMBULATORY_CARE_PROVIDER_SITE_OTHER): Payer: Federal, State, Local not specified - PPO | Admitting: Sports Medicine

## 2018-06-18 DIAGNOSIS — J0110 Acute frontal sinusitis, unspecified: Secondary | ICD-10-CM | POA: Insufficient documentation

## 2018-06-18 DIAGNOSIS — R059 Cough, unspecified: Secondary | ICD-10-CM

## 2018-06-18 DIAGNOSIS — R05 Cough: Secondary | ICD-10-CM

## 2018-06-18 DIAGNOSIS — R509 Fever, unspecified: Secondary | ICD-10-CM

## 2018-06-18 DIAGNOSIS — J0111 Acute recurrent frontal sinusitis: Secondary | ICD-10-CM

## 2018-06-18 LAB — POCT INFLUENZA A/B
Influenza A, POC: NEGATIVE
Influenza B, POC: NEGATIVE

## 2018-06-18 MED ORDER — FLUTICASONE PROPIONATE 50 MCG/ACT NA SUSP: NASAL | 3 refills | Status: DC

## 2018-06-18 MED ORDER — AZITHROMYCIN 250 MG PO TABS: ORAL_TABLET | ORAL | 0 refills | Status: DC

## 2018-06-18 MED FILL — AZITHROMYCIN 250 MG TABLET: 250 | 5 days supply | Qty: 6 | Fill #0

## 2018-06-18 MED FILL — HYDROXYCHLOROQUINE SULFATE: 200 | 30 days supply | Qty: 60 | Fill #1

## 2018-06-18 MED FILL — FLUTICASONE PROP 50 MCG SPR: 50 | 90 days supply | Qty: 48 | Fill #0

## 2018-06-18 NOTE — Assessment & Plan Note (Signed)
Negative flu test, no fever here today, suspect acute maxillary and frontal sinusitis. Negative for several year. Starting azithromycin, Flonase. May return to work when afebrile for greater than a 24 hours

## 2018-06-18 NOTE — Progress Notes (Signed)
Subjective:    CC: Facial pain  HPI: This is a pleasant 48 year old female, for the past several days she is had increasing facial pain and pressure, purulent nasal discharge, mild cough.  She had a temperature up to about 100 F yesterday, no fevers today, no muscle aches, body aches.  No sick contacts.  No GI symptoms, no skin rash, no shortness of breath or chest pain.  I reviewed the past medical history, family history, social history, surgical history, and allergies today and no changes were needed.  Please see the problem list section below in epic for further details.  Past Medical History: Past Medical History:  Diagnosis Date  . Lupus ([place])   . Personal history of amenorrhea   . Sinusitis    Past Surgical History: Past Surgical History:  Procedure Laterality Date  . DILATION AND CURETTAGE OF UTERUS    . TONSILLECTOMY     Social History: Social History   Socioeconomic History  . Marital status: Widowed    Spouse name: Not on file  . Number of children: Not on file  . Years of education: Not on file  . Highest education level: Not on file  Occupational History  . Not on file  Social Needs  . Financial resource strain: Not on file  . Food insecurity:    Worry: Not on file    Inability: Not on file  . Transportation needs:    Medical: Not on file    Non-medical: Not on file  Tobacco Use  . Smoking status: Never Smoker  . Smokeless tobacco: Never Used  Substance and Sexual Activity  . Alcohol use: No  . Drug use: No  . Sexual activity: Not Currently    Birth control/protection: Pill    Comment: 1st intercourse- 19, partners-1, widow  Lifestyle  . Physical activity:    Days per week: Not on file    Minutes per session: Not on file  . Stress: Not on file  Relationships  . Social connections:    Talks on phone: Not on file    Gets together: Not on file    Attends religious service: Not on file    Active member of club or organization: Not on file   Attends meetings of clubs or organizations: Not on file    Relationship status: Not on file  Other Topics Concern  . Not on file  Social History Narrative  . Not on file   Family History: Family History  Problem Relation Age of Onset  . Cancer Mother        brain  . Hypertension Father   . Diabetes Brother    Allergies: Allergies  Allergen Reactions  . Aspirin Other (See Comments)    GI issues  . Augmentin [Amoxicillin-Pot Clavulanate] Diarrhea  . Ciprofloxacin     Nausea and vomiting.   [name] Hydrocodone     Cough syrup/nausea  . Ivp Dye [Iodinated Diagnostic Agents]    Medications: See med rec.  Review of Systems: No fevers, chills, night sweats, weight loss, chest pain, or shortness of breath.   Objective:    General: Well Developed, well nourished, and in no acute distress.  Neuro: Alert and oriented x3, extra-ocular muscles intact, sensation grossly intact.  HEENT: Normocephalic, atraumatic, pupils equal round reactive to light, neck supple, no masses, no lymphadenopathy, thyroid nonpalpable.  Tender to palpation over the frontal and maxillary sinuses, otherwise oropharynx, nasopharynx, ear canals unremarkable. Skin: Warm and dry, no rashes. Cardiac: Regular rate  and rhythm, no murmurs rubs or gallops, no lower extremity edema.  Respiratory: Clear to auscultation bilaterally. Not using accessory muscles, speaking in full sentences.  Negative rapid flu test.  Impression and Recommendations:    Acute frontal sinusitis Negative flu test, no fever here today, suspect acute maxillary and frontal sinusitis. Negative for several year. Starting azithromycin, Flonase. May return to work when afebrile for greater than a 24 hours   ___________________________________________ Gwen Her. Dianah Field, M.D., ABFM., CAQSM. Primary Care and Sports Medicine [facility]  Adjunct Professor of [employer] [facility] [employer]

## 2018-06-24 DIAGNOSIS — M2550 Pain in unspecified joint: Secondary | ICD-10-CM | POA: Diagnosis not present

## 2018-06-24 DIAGNOSIS — L930 Discoid lupus erythematosus: Secondary | ICD-10-CM | POA: Diagnosis not present

## 2018-06-24 DIAGNOSIS — G4733 Obstructive sleep apnea (adult) (pediatric): Secondary | ICD-10-CM | POA: Diagnosis not present

## 2018-06-25 ENCOUNTER — Ambulatory Visit: Payer: Self-pay | Admitting: Sports Medicine

## 2018-07-06 ENCOUNTER — Encounter: Payer: Self-pay | Admitting: Family Medicine

## 2018-07-06 ENCOUNTER — Other Ambulatory Visit: Payer: Self-pay

## 2018-07-06 ENCOUNTER — Telehealth (INDEPENDENT_AMBULATORY_CARE_PROVIDER_SITE_OTHER): Payer: Federal, State, Local not specified - PPO | Admitting: Family Medicine

## 2018-07-06 VITALS — BP 137/99 | HR 85 | Temp 97.6°F | Ht 66.0 in | Wt 275.0 lb

## 2018-07-06 DIAGNOSIS — M791 Myalgia, unspecified site: Secondary | ICD-10-CM

## 2018-07-06 DIAGNOSIS — R0789 Other chest pain: Secondary | ICD-10-CM

## 2018-07-06 DIAGNOSIS — M2550 Pain in unspecified joint: Secondary | ICD-10-CM

## 2018-07-06 DIAGNOSIS — G4733 Obstructive sleep apnea (adult) (pediatric): Secondary | ICD-10-CM

## 2018-07-06 DIAGNOSIS — R509 Fever, unspecified: Secondary | ICD-10-CM | POA: Diagnosis not present

## 2018-07-06 DIAGNOSIS — K1379 Other lesions of oral mucosa: Secondary | ICD-10-CM

## 2018-07-06 MED ORDER — PREDNISONE 20 MG PO TABS: 40.0000 mg | ORAL_TABLET | Freq: Every day | ORAL | 0 refills | Status: DC

## 2018-07-06 MED ORDER — EPINEPHRINE 0.3 MG/0.3ML IJ SOAJ: INTRAMUSCULAR | 4 refills | Status: DC

## 2018-07-06 MED ORDER — ALBUTEROL SULFATE HFA 108 (90 BASE) MCG/ACT IN AERS: 2.0000 | INHALATION_SPRAY | Freq: Four times a day (QID) | RESPIRATORY_TRACT | 1 refills | Status: DC | PRN

## 2018-07-06 MED FILL — EPINEPHRINE 0.3 MG AUTO-INJ: 0.3 | 2 days supply | Qty: 2 | Fill #0

## 2018-07-06 MED FILL — ALBUTEROL SULFATE HFA 108 (: 108 (90 BAS | 25 days supply | Qty: 9 | Fill #0

## 2018-07-06 MED FILL — predniSONE 20 MG TABS: 20 | 5 days supply | Qty: 10 | Fill #0

## 2018-07-06 NOTE — Progress Notes (Signed)
Virtual Visit via Video Note  I connected with Jennifer Erickson on 07/06/18 at  2:00 PM EDT by a video enabled telemedicine application and verified that I am speaking with the correct person using two identifiers.   I discussed the limitations of evaluation and management by telemedicine and the availability of in person appointments. The patient expressed understanding and agreed to proceed.  Subjective:    CC: bodyaches   HPI:   48 yo female w/ hx of SLE on plaquenil c/o of joint pain x 1 week.  She thought it was her arthritis.  Past 4 days she has been taking pain medications and her pain device ( Quell device with electrical stimulation) to help and this has not helped at all.  She has had some nausea, but no vomiting. No diarrhea. Some sweats but no chills. No fever. Mild HA as well.  Using her tylenol to the point has used a whole bottle. She feels extremely exhausted.  Pain front of her legs, heels of her feet. Using her heating pad.  Both knees and hips.  Some mild back pain. Feels like she has been in a fight.  She missed work last nigiht.  No tick bites. No other sick contacts.    She did report that her chest has been tight especially when she goes outside. She has been coughing more but non productive, taking zyrtec for her seasonal allergies which helps the sneezing.  She has severe allergies. Occ uses her inhaler. The takes turmeric.  She has been congested so hasn't been able to wear her CPAP.    SLE - followed by [facility].  She is currently on Plaquenil and says she has not had any dosage changes etc.  She says she is never had pain like this before because of her lupus.  She was seen on 06/18/2018 for fever, facial pain and cough and  given a z-pack. She had a negative flu test at that time. She was treated for sinusitis. She was given Flonase as well.  She feels like the cough and sinuses are better.  seasonal allergies - she is using her flonase.  She is been  trying to stay out of the pollen as it is a big trigger for her.  Past medical history, Surgical history, Family history not pertinant except as noted below, Social history, Allergies, and medications have been entered into the medical record, reviewed, and corrections made.   Review of Systems: No fevers, chills, night sweats, weight loss, chest pain, or shortness of breath.   Objective:    General: Speaking clearly in complete sentences without any shortness of breath.  Alert and oriented x3.  Normal judgment. No apparent acute distress. Well groomed.     Impression and Recommendations:   Arthralgia  and fatigue -unclear etiology at this point.  It does not sound she is ever had pain like this with her lupus before but I would like to check inflammatory markers as well as a CBC to look for acute infection even though she is having some nausea but no acute respiratory symptoms consider that this could be covered with a mild version that just is mostly body aches.  She has quite severe fatigue but because of nasal congestion has not really been able to wear her CPAP so that certainly could be contributing.  Could consider short use of Afrin.  For now we will write her out of work for the next couple of days.  No other  recent travel or tick bites etc. which would increase her risk for other potential causes.  We discussed options for acute treatment.  She says she is responded in the past really well to joint injection so we discussed maybe a round of oral prednisone to see if this provides any temporary or acute relief.  Seasonal allergies -continue with Flonase.  Also needs refill on Epipen.    OSA -not wearing her sleep Pap currently because a nasal congestion.  Is using her Flonase.   I discussed the assessment and treatment plan with the patient. The patient was provided an opportunity to ask questions and all were answered. The patient agreed with the plan and demonstrated an understanding  of the instructions.   The patient was advised to call back or seek an in-person evaluation if the symptoms worsen or if the condition fails to improve as anticipated.     Beatrice Lecher, MD

## 2018-07-06 NOTE — Progress Notes (Signed)
She thought it was her arthritis x 1wk. Past 4 days she has been taking pain medications and her pain device to help and this has not helped at all. She has had some nausea but has not threw up. No diarrhea. Some sweats but no chills. No fever,or headaches. She did report that her chest has been tight especially when she goes outside. She has been coughing more but non productive, taking zyrtec for her allergies which helps the sneezing.   She was seen on 06/18/2018 for fever, and cough given a z-pack.Elouise Munroe, [place]

## 2018-07-07 ENCOUNTER — Other Ambulatory Visit: Payer: Self-pay | Admitting: *Deleted

## 2018-07-07 DIAGNOSIS — R748 Abnormal levels of other serum enzymes: Secondary | ICD-10-CM

## 2018-07-07 LAB — COMPLETE METABOLIC PANEL WITH GFR
AG Ratio: 1.1 (calc) (ref 1.0–2.5)
ALT: 24 U/L (ref 6–29)
AST: 17 U/L (ref 10–35)
Albumin: 3.9 g/dL (ref 3.6–5.1)
Alkaline phosphatase (APISO): 95 U/L (ref 31–125)
BUN: 13 mg/dL (ref 7–25)
CO2: 24 mmol/L (ref 20–32)
Calcium: 9.1 mg/dL (ref 8.6–10.2)
Chloride: 104 mmol/L (ref 98–110)
Creat: 0.83 mg/dL (ref 0.50–1.10)
GFR, Est African American: 97 mL/min/{1.73_m2} (ref >=60)
GFR, Est Non African American: 84 mL/min/{1.73_m2} (ref >=60)
Globulin: 3.5 g/dL (calc) (ref 1.9–3.7)
Glucose, Bld: 93 mg/dL (ref 65–139)
Potassium: 3.8 mmol/L (ref 3.5–5.3)
Sodium: 138 mmol/L (ref 135–146)
Total Bilirubin: 0.3 mg/dL (ref 0.2–1.2)
Total Protein: 7.4 g/dL (ref 6.1–8.1)

## 2018-07-07 LAB — CBC WITH DIFFERENTIAL/PLATELET
Absolute Monocytes: 569 cells/uL (ref 200–950)
Basophils Absolute: 22 cells/uL (ref 0–200)
Basophils Relative: 0.3 %
Eosinophils Absolute: 117 cells/uL (ref 15–500)
Eosinophils Relative: 1.6 %
HCT: 40.6 % (ref 35.0–45.0)
Hemoglobin: 14.1 g/dL (ref 11.7–15.5)
Lymphs Abs: 2708 cells/uL (ref 850–3900)
MCH: 30.9 pg (ref 27.0–33.0)
MCHC: 34.7 g/dL (ref 32.0–36.0)
MCV: 89 fL (ref 80.0–100.0)
MPV: 10.1 fL (ref 7.5–12.5)
Monocytes Relative: 7.8 %
Neutro Abs: 3884 cells/uL (ref 1500–7800)
Neutrophils Relative %: 53.2 %
Platelets: 286 10*3/uL (ref 140–400)
RBC: 4.56 10*6/uL (ref 3.80–5.10)
RDW: 12.8 % (ref 11.0–15.0)
Total Lymphocyte: 37.1 %
WBC: 7.3 10*3/uL (ref 3.8–10.8)

## 2018-07-07 LAB — CK: Total CK: 169 U/L — ABNORMAL HIGH (ref 29–143)

## 2018-07-07 LAB — SPECIMEN COMPROMISED

## 2018-07-07 LAB — TSH: TSH: 1.31 mIU/L

## 2018-07-07 LAB — C-REACTIVE PROTEIN: CRP: 7.7 mg/L (ref <8.0)

## 2018-07-07 LAB — SEDIMENTATION RATE: Sed Rate: 58 mm/h — ABNORMAL HIGH (ref 0–20)

## 2018-07-16 ENCOUNTER — Ambulatory Visit: Payer: Self-pay | Admitting: Sports Medicine

## 2018-07-23 ENCOUNTER — Ambulatory Visit (INDEPENDENT_AMBULATORY_CARE_PROVIDER_SITE_OTHER): Payer: Federal, State, Local not specified - PPO | Admitting: Sports Medicine

## 2018-07-23 ENCOUNTER — Encounter: Payer: Self-pay | Admitting: Sports Medicine

## 2018-07-23 DIAGNOSIS — E8881 Metabolic syndrome: Secondary | ICD-10-CM | POA: Diagnosis not present

## 2018-07-23 DIAGNOSIS — T701XXA Sinus barotrauma, initial encounter: Secondary | ICD-10-CM | POA: Diagnosis not present

## 2018-07-23 DIAGNOSIS — M722 Plantar fascial fibromatosis: Secondary | ICD-10-CM

## 2018-07-23 MED ORDER — OXYMETAZOLINE HCL 0.05 % NA SOLN: 1.0000 | Freq: Two times a day (BID) | NASAL | 0 refills | Status: DC

## 2018-07-23 MED ORDER — FREESTYLE LIBRE 14 DAY READER DEVI: 1.0000 "application " | 11 refills | Status: DC

## 2018-07-23 MED ORDER — FREESTYLE LIBRE 14 DAY SENSOR MISC: 1.0000 "application " | 11 refills | Status: DC

## 2018-07-23 MED FILL — 12 HOUR NASAL DECONGESTANT: 0.05 | 15 days supply | Qty: 30 | Fill #0

## 2018-07-23 NOTE — Assessment & Plan Note (Addendum)
She is only driving up to about 5329 to 4000 feet of altitude, her headaches are likely sinus related/barotrauma. I do not think this is altitude sickness. Generally 8,000 to 10,000 feet of altitude is needed before altitude sickness develops. For this reason we will avoid Diamox, I am going to have her do a couple of sprays of Afrin when [place] is driving [place] to the [place].

## 2018-07-23 NOTE — Patient Instructions (Signed)
Ear Barotrauma, Adult  Ear barotrauma is inflammation of the middle ear. This condition occurs when an auditory tube (eustachian tube) is blocked in one or both ears. The eustachian tubes lead from the middle ear to the back of the nose (nasopharynx). This condition typically occurs when you experience changes in air pressure, such as when flying or scuba diving. Untreated ear barotrauma may lead to ear damage or hearing loss, which can become permanent. What are the causes? The pressure difference that causes ear barotrauma can result from various things, including:  Flying in an airplane.  Descending or coming to the surface too quickly when scuba diving.  Going to higher altitudes quickly.  Being too close to an explosion or blast. What increases the risk? The following factors may make you more likely to develop this condition:  A middle ear infection, sinus infection, or a cold.  Environmental allergies.  Small eustachian tubes.  Recent ear surgery. What are the signs or symptoms? Symptoms of this condition include:  Ear pain.  Sudden partial or complete loss of hearing.  Feeling that your ear is clogged.  Dizziness that creates a spinning, rocking, or tumbling sensation (vertigo).  Loss of balance.  Nausea.  Ringing in your ears.  Bleeding from your ears.  Headache.  Facial pain. How is this diagnosed? This condition may be diagnosed based on:  A physical exam. Your health care provider may: ? Use a device (otoscope) to look into your ear canal and check the eardrum. ? Do a test that changes air pressure in the middle ear (tympanogram). This test checks how well the eardrum and eustachian tube are working.  Your medical history and your symptoms.  A hearing test. You may be referred to a health care provider who specializes in treating ear conditions (otolaryngologist, or "ENT"). How is this treated? This condition may be treated with:  Medicines to  relieve stuffiness (congestion) in your nose, sinus, or upper respiratory tract (decongestants).  Techniques to "pop" your ears (equalize pressure), such as: ? Yawning. ? Chewing gum. ? Swallowing. ? Holding your nose closed and gently blowing.  Surgery to relieve symptoms or prevent future inflammation. This is done in severe cases. Follow these instructions at home:  Take over-the-counter and prescription medicines only as told by your health care provider.  Use techniques to equalize pressure as told by your health care provider.  Do not put anything into your ears to clean or unplug them. Ear drops will not help.  Do not do any of the following until your health care provider approves: ? Travel to high altitudes. ? Fly in an airplane. ? Scuba dive.  Keep all follow-up visits as told by your health care provider. This is important. How is this prevented? Using these strategies may help to prevent ear barotrauma:  Avoid exposure to pressure changes when you have symptoms of a cold or congestion.  If you have congestion and will be flying, use a nasal decongestant about 30-60 minutes before flying.  When flying, chew gum and swallow frequently and forcefully during takeoff and landing.  Hold your nose closed and gently blow to "pop" your ears. This forces air into the eustachian tubes and equalizes pressure.  Yawn during air pressure changes.  If scuba diving, dive feet first and equalize pressure often as you go deeper in the water. Contact a health care provider if:  Your symptoms get worse or do not get better.  You have vertigo.  You have hearing  loss.  You have a fever. Get help right away if:  You have severe ear pain.  You have a severe headache.  You have severe dizziness.  You have bloody or pus-like drainage from your ears.  You have balance problems.  You cannot move or feel part of your face. Summary  Ear barotrauma is inflammation of the  middle ear.  This condition typically occurs when you experience changes in pressure, such as when flying or scuba diving.  You may be at a higher risk for this condition if you have small eustachian tubes, had recent ear surgery, or have allergies, a cold, or a sinus or middle ear infection.  This condition may be treated with medicines or techniques to "pop" your ears (equalize pressure).  Strategies can be used to help prevent ear barotrauma. This information is not intended to replace advice given to you by your health care provider. Make sure you discuss any questions you have with your health care provider. Document Released: 02/17/2006 Document Revised: 06/27/2016 Document Reviewed: 06/27/2016 Elsevier Interactive Patient Education  2019 [place] (THIS IS NOT WHAT YOU HAVE) Altitude sickness occurs when a person goes to a high altitude without first letting the body adjust (acclimate) to the higher altitude.Depending on the severity, altitude sickness can be a medical emergency. It can develop into a life-threatening condition. What are the causes? This condition is caused by rapidly going to an altitude of at least 8,200 ft (2,460 m) above sea level. At this altitude, the air pressure and oxygen levels are lower. What increases the risk? This condition can happen to anyone, regardless of physical condition. However, you are more likely to develop the condition if:  You go to a high altitude quickly and are physically activeat that altitude. This may include exercising or moving a lot.  You have a chronic breathing (respiratory) condition. What are the signs or symptoms? Symptoms of this condition usually develop within 72 hours of arriving at the high altitude. Symptoms of this condition include:  A severe headache.  Nausea and vomiting.  Shortness of breath.  Dizziness.  Confusion.  Uncoordinated movements.  Fatigue.  Trouble sleeping.   Weakness.  Hallucinations. How is this diagnosed? This condition may be diagnosed based on a physical exam and your medical history. Sometimes a chest X-ray is taken. How is this treated? Treatment for this condition depends on the severity of the condition.  In mild cases, treatment may not be needed as symptoms will gradually go away on their own in 3-5 days.  In more serious cases, you may need treatment. To treat the condition: ? You will be moved to an altitude of 1,800 ft (540 m) above sea level or lower as quickly and safely as possible. ? You may also be given oxygen and medicines to help with breathing.  If your condition is severe, you may need to stay in the hospital. Follow these instructions at home:   If you must exercise, do only light exercise for the first 24-36 hours after treatment.  Drink enough fluids to keep your urine pale yellow.  Eat small, light meals.  Avoid: ? Taking calming medicines (sedatives). ? Alcohol.  Do not use any products that contain nicotine or tobacco, such as cigarettes and e-cigarettes. If you need help quitting, ask your health care provider.  Stay at a low altitude until your symptoms improve.  Have someone stay with you until you feel stable.  Keep all follow-up  visits as told by your health care provider. This is important. How is this prevented?  Go to higher altitudes slowly, giving your body time to acclimate.  Go to higher altitudes during the daytime and return to lower altitudes at night.  Give your body a few days to adjust to a change in altitude before starting physical activities that require great effort.  Ask your health care provider about medicines you can take to prevent altitude sickness. Get help right away if:  You have: ? Chest pain or tightness. ? A fast heartbeat. ? A severe headache. ? A severe cough. ? Difficulty walking. ? Difficulty concentrating. ? Severe shortness of breath at rest or with  exertion.  You feel confused. Summary  Altitude sickness occurs when a person goes to a high altitude without first letting the body adjust to the higher altitude.  Symptoms include headache, nausea, vomiting, confusion, and shortness of breath.  Depending on the severity, altitude sickness can be a medical emergency. It can develop into a life-threatening condition.  Get medical help right away if you have chest pain, a fast heartbeat, trouble breathing, or you feel confused. This information is not intended to replace advice given to you by your health care provider. Make sure you discuss any questions you have with your health care provider. Document Released: 03/14/2000 Document Revised: 07/09/2017 Document Reviewed: 07/09/2017 Elsevier Interactive Patient Education  [employer].

## 2018-07-23 NOTE — Progress Notes (Signed)
Subjective:    CC: Multiple issues  HPI: Plantar fasciitis: Left-sided worse than right, we did last did an injection in October 2019.  Pain is moderate, persistent, localized without radiation.  Headaches: These typically occur when driving up to higher altitudes such as in [place], she is concerned that she might have altitude sickness.  Mostly frontal headaches.  No nausea, no confusion.  No shortness of breath.  Obesity: With metabolic syndrome, hyperglycemia, she is doing her best to diet, she would like to have a blood sugar meter to help her determine which foods are associated with rapid increases in her blood sugars.  I reviewed the past medical history, family history, social history, surgical history, and allergies today and no changes were needed.  Please see the problem list section below in epic for further details.  Past Medical History: Past Medical History:  Diagnosis Date  . Lupus ([place])   . Personal history of amenorrhea   . Sinusitis    Past Surgical History: Past Surgical History:  Procedure Laterality Date  . DILATION AND CURETTAGE OF UTERUS    . TONSILLECTOMY     Social History: Social History   Socioeconomic History  . Marital status: Widowed    Spouse name: Not on file  . Number of children: Not on file  . Years of education: Not on file  . Highest education level: Not on file  Occupational History  . Not on file  Social Needs  . Financial resource strain: Not on file  . Food insecurity:    Worry: Not on file    Inability: Not on file  . Transportation needs:    Medical: Not on file    Non-medical: Not on file  Tobacco Use  . Smoking status: Never Smoker  . Smokeless tobacco: Never Used  Substance and Sexual Activity  . Alcohol use: No  . Drug use: No  . Sexual activity: Not Currently    Birth control/protection: Pill    Comment: 1st intercourse- 19, partners-1, widow  Lifestyle  . Physical activity:    Days per week: Not on file   Minutes per session: Not on file  . Stress: Not on file  Relationships  . Social connections:    Talks on phone: Not on file    Gets together: Not on file    Attends religious service: Not on file    Active member of club or organization: Not on file    Attends meetings of clubs or organizations: Not on file    Relationship status: Not on file  Other Topics Concern  . Not on file  Social History Narrative  . Not on file   Family History: Family History  Problem Relation Age of Onset  . Cancer Mother        brain  . Hypertension Father   . Diabetes Brother    Allergies: Allergies  Allergen Reactions  . Aspirin Other (See Comments)    GI issues  . Augmentin [Amoxicillin-Pot Clavulanate] Diarrhea  . Ciprofloxacin     Nausea and vomiting.   [name] Hydrocodone     Cough syrup/nausea  . Ivp Dye [Iodinated Diagnostic Agents]    Medications: See med rec.  Review of Systems: No fevers, chills, night sweats, weight loss, chest pain, or shortness of breath.   Objective:    General: Well Developed, well nourished, and in no acute distress.  Neuro: Alert and oriented x3, extra-ocular muscles intact, sensation grossly intact.  HEENT: Normocephalic, atraumatic, pupils equal  round reactive to light, neck supple, no masses, no lymphadenopathy, thyroid nonpalpable.  Skin: Warm and dry, no rashes. Cardiac: Regular rate and rhythm, no murmurs rubs or gallops, no lower extremity edema.  Respiratory: Clear to auscultation bilaterally. Not using accessory muscles, speaking in full sentences.  Impression and Recommendations:    Plantar fasciitis, bilateral Previous injection was back in October 2019. Recurrence of pain, adding a nighttime splint. She will return to see me in a month and do an injection if no better.  Effect of high altitude on sinuses It is only driving up to about 1314 to 4000 feet of altitude, her headaches are likely sinus related/barotrauma. I do not think this is  altitude sickness. For this reason we will avoid Diamox, I am going to have her do a couple of sprays of Afrin when she is driving [place] to the [place].  Metabolic syndrome with hyperglycemia Working aggressively on weight loss, I would like her to use the freestyle libre 2-week blood pressure monitor system. This will help her determine which foods raise her blood sugars and which ones do not.   ___________________________________________ Gwen Her. Dianah Field, M.D., ABFM., CAQSM. Primary Care and Sports Medicine [facility]  Adjunct Professor of [place] of [facility] [employer]

## 2018-07-23 NOTE — Assessment & Plan Note (Signed)
Previous injection was back in October 2019. Recurrence of pain, adding a nighttime splint. She will return to see me in a month and do an injection if no better.

## 2018-07-23 NOTE — Assessment & Plan Note (Signed)
Working aggressively on weight loss, I would like her to use the freestyle libre 2-week blood pressure monitor system. This will help her determine which foods raise her blood sugars and which ones do not.

## 2018-07-25 DIAGNOSIS — G4733 Obstructive sleep apnea (adult) (pediatric): Secondary | ICD-10-CM | POA: Diagnosis not present

## 2018-08-20 ENCOUNTER — Ambulatory Visit: Payer: Self-pay | Admitting: Sports Medicine

## 2018-08-24 DIAGNOSIS — G4733 Obstructive sleep apnea (adult) (pediatric): Secondary | ICD-10-CM | POA: Diagnosis not present

## 2018-09-24 DIAGNOSIS — G4733 Obstructive sleep apnea (adult) (pediatric): Secondary | ICD-10-CM | POA: Diagnosis not present

## 2018-10-19 ENCOUNTER — Ambulatory Visit (INDEPENDENT_AMBULATORY_CARE_PROVIDER_SITE_OTHER): Payer: Federal, State, Local not specified - PPO | Admitting: Sports Medicine

## 2018-10-19 ENCOUNTER — Other Ambulatory Visit: Payer: Self-pay

## 2018-10-19 ENCOUNTER — Encounter: Payer: Self-pay | Admitting: Sports Medicine

## 2018-10-19 DIAGNOSIS — M79672 Pain in left foot: Secondary | ICD-10-CM | POA: Diagnosis not present

## 2018-10-19 NOTE — Progress Notes (Signed)
Subjective:    CC: Left heel pain  HPI: For the past several weeks this pleasant 48 year old female has had pain in the left heel, just proximal to the Achilles insertion, worse with prolonged weightbearing.  This has happened before but today it is more severe than usual, no trauma, no radiation, no pain on the bottom of the heel.  Persistent.  I reviewed the past medical history, family history, social history, surgical history, and allergies today and no changes were needed.  Please see the problem list section below in epic for further details.  Past Medical History: Past Medical History:  Diagnosis Date  . Lupus ([place])   . Personal history of amenorrhea   . Sinusitis    Past Surgical History: Past Surgical History:  Procedure Laterality Date  . DILATION AND CURETTAGE OF UTERUS    . TONSILLECTOMY     Social History: Social History   Socioeconomic History  . Marital status: Widowed    Spouse name: Not on file  . Number of children: Not on file  . Years of education: Not on file  . Highest education level: Not on file  Occupational History  . Not on file  Social Needs  . Financial resource strain: Not on file  . Food insecurity    Worry: Not on file    Inability: Not on file  . Transportation needs    Medical: Not on file    Non-medical: Not on file  Tobacco Use  . Smoking status: Never Smoker  . Smokeless tobacco: Never Used  Substance and Sexual Activity  . Alcohol use: No  . Drug use: No  . Sexual activity: Not Currently    Birth control/protection: Pill    Comment: 1st intercourse- 19, partners-1, widow  Lifestyle  . Physical activity    Days per week: Not on file    Minutes per session: Not on file  . Stress: Not on file  Relationships  . Social Herbalist on phone: Not on file    Gets together: Not on file    Attends religious service: Not on file    Active member of club or organization: Not on file    Attends meetings of clubs or  organizations: Not on file    Relationship status: Not on file  Other Topics Concern  . Not on file  Social History Narrative  . Not on file   Family History: Family History  Problem Relation Age of Onset  . Cancer Mother        brain  . Hypertension Father   . Diabetes Brother    Allergies: Allergies  Allergen Reactions  . Aspirin Other (See Comments)    GI issues  . Augmentin [Amoxicillin-Pot Clavulanate] Diarrhea  . Ciprofloxacin     Nausea and vomiting.   [name] Hydrocodone     Cough syrup/nausea  . Ivp Dye [Iodinated Diagnostic Agents]    Medications: See med rec.  Review of Systems: No fevers, chills, night sweats, weight loss, chest pain, or shortness of breath.   Objective:    General: Well Developed, well nourished, and in no acute distress.  Neuro: Alert and oriented x3, extra-ocular muscles intact, sensation grossly intact.  HEENT: Normocephalic, atraumatic, pupils equal round reactive to light, neck supple, no masses, no lymphadenopathy, thyroid nonpalpable.  Skin: Warm and dry, no rashes. Cardiac: Regular rate and rhythm, no murmurs rubs or gallops, no lower extremity edema.  Respiratory: Clear to auscultation bilaterally. Not using accessory  muscles, speaking in full sentences. Left ankle: No visible erythema or swelling. Range of motion is full in all directions. Strength is 5/5 in all directions. Stable lateral and medial ligaments; squeeze test and kleiger test unremarkable; Talar dome nontender; No pain at base of 5th MT; No tenderness over cuboid; No tenderness over N spot or navicular prominence No tenderness on posterior aspects of lateral and medial malleolus No sign of peroneal tendon subluxations; Negative tarsal tunnel tinel's Only mild tenderness at the Achilles insertion. Negative Thompson's test. Ambulates with an antalgic gait.  Impression and Recommendations:    Pain of left heel Cam boot for 2 weeks. Eccentric rehab given. Return to  see me in 4 weeks.   ___________________________________________ Gwen Her. Dianah Field, M.D., ABFM., CAQSM. Primary Care and Sports Medicine [facility]  Adjunct Professor of [place] of [facility] [employer]

## 2018-10-19 NOTE — Assessment & Plan Note (Signed)
Cam boot for 2 weeks. Eccentric rehab given. Return to see me in 4 weeks.

## 2018-10-19 NOTE — Patient Instructions (Signed)
Begin with easy walking, heel, toe and backwards  Do calf raises on a step:  First lower and then raise on 1 foot  If this is painful, lower on 1 foot, do the heel raise on both feet Begin with 3 sets of 10 repetitions  Increase by 5 repetitions every 3 days  Goal is 3 sets of 30 repetitions  Do with both straight knee and knee at 20 degrees of flexion  If pain persists, once you can do 3 sets of 30 without weight, add backpack with 5 lbs.  Increase by 5 lbs per week to max of 30 lbs for 3 sets of 15

## 2018-10-24 DIAGNOSIS — G4733 Obstructive sleep apnea (adult) (pediatric): Secondary | ICD-10-CM | POA: Diagnosis not present

## 2018-10-29 ENCOUNTER — Ambulatory Visit (INDEPENDENT_AMBULATORY_CARE_PROVIDER_SITE_OTHER): Payer: Federal, State, Local not specified - PPO | Admitting: Physician Assistant

## 2018-10-29 ENCOUNTER — Other Ambulatory Visit: Payer: Self-pay

## 2018-10-29 VITALS — Temp 98.6°F

## 2018-10-29 DIAGNOSIS — Z23 Encounter for immunization: Secondary | ICD-10-CM | POA: Diagnosis not present

## 2018-10-29 NOTE — Progress Notes (Signed)
Established Patient Office Visit  Subjective:  Patient ID: Jennifer Erickson, female    DOB: 06-11-1970  Age: 48 y.o. MRN: [id number]  CC:  Chief Complaint  Patient presents with  . Immunizations    HPI [facility] presents for tetanus vaccine.   Past Medical History:  Diagnosis Date  . Lupus ([place])   . Personal history of amenorrhea   . Sinusitis     Past Surgical History:  Procedure Laterality Date  . DILATION AND CURETTAGE OF UTERUS    . TONSILLECTOMY      Family History  Problem Relation Age of Onset  . Cancer Mother        brain  . Hypertension Father   . Diabetes Brother     Social History   Socioeconomic History  . Marital status: Widowed    Spouse name: Not on file  . Number of children: Not on file  . Years of education: Not on file  . Highest education level: Not on file  Occupational History  . Not on file  Social Needs  . Financial resource strain: Not on file  . Food insecurity    Worry: Not on file    Inability: Not on file  . Transportation needs    Medical: Not on file    Non-medical: Not on file  Tobacco Use  . Smoking status: Never Smoker  . Smokeless tobacco: Never Used  Substance and Sexual Activity  . Alcohol use: No  . Drug use: No  . Sexual activity: Not Currently    Birth control/protection: Pill    Comment: 1st intercourse- 19, partners-1, widow  Lifestyle  . Physical activity    Days per week: Not on file    Minutes per session: Not on file  . Stress: Not on file  Relationships  . Social Herbalist on phone: Not on file    Gets together: Not on file    Attends religious service: Not on file    Active member of club or organization: Not on file    Attends meetings of clubs or organizations: Not on file    Relationship status: Not on file  . Intimate partner violence    Fear of current or ex partner: Not on file    Emotionally abused: Not on file    Physically abused: Not on file   Forced sexual activity: Not on file  Other Topics Concern  . Not on file  Social History Narrative  . Not on file    Outpatient Medications Prior to Visit  Medication Sig Dispense Refill  . albuterol (PROVENTIL HFA;VENTOLIN HFA) 108 (90 Base) MCG/ACT inhaler Inhale 2 puffs into the lungs every 6 (six) hours as needed for wheezing or shortness of breath. 1 Inhaler 1  . AMBULATORY NON FORMULARY MEDICATION Continuous positive airway pressure (CPAP) machine auto-titrate from 4-20 cm of H2O pressure, with all supplemental supplies as needed.  Send 30 day report to Dr Georgina Snell [phone] after 30 days 1 each 0  . Continuous Blood Gluc Receiver (FREESTYLE LIBRE 14 DAY READER) DEVI 1 application by Does not apply route every 14 (fourteen) days. Use with sensor system 1 Device 11  . Continuous Blood Gluc Sensor (FREESTYLE LIBRE 14 DAY SENSOR) MISC 1 application by Does not apply route every 14 (fourteen) days. Apply upper deltoid every 14 days, use reader to determine blood sugars 2 each 11  . EPINEPHrine (EPIPEN 2-PAK) 0.3 mg/0.3 mL IJ SOAJ injection INJECT 0.3  MLS (0.3 MG TOTAL) INTO THE MUSCLE ONCE. 1 Device 4  . etonogestrel (IMPLANON) 68 MG IMPL implant Inject 1 each into the skin once.    . fluticasone (FLONASE) 50 MCG/ACT nasal spray One spray in each nostril twice a day, use left hand for right nostril, and right hand for left nostril. 48 g 3  . hydroxychloroquine (PLAQUENIL) 200 MG tablet Take 200 mg by mouth 2 (two) times daily.  3   No facility-administered medications prior to visit.     Allergies  Allergen Reactions  . Aspirin Other (See Comments)    GI issues  . Augmentin [Amoxicillin-Pot Clavulanate] Diarrhea  . Ciprofloxacin     Nausea and vomiting.   [name] Hydrocodone     Cough syrup/nausea  . Ivp Dye [Iodinated Diagnostic Agents]     ROS Review of Systems    Objective:    Physical Exam  Temp 98.6 F (37 C) (Oral)  Wt Readings from Last 3 Encounters:  10/19/18 280 lb  (127 kg)  07/23/18 278 lb (126.1 kg)  07/06/18 275 lb (124.7 kg)     Health Maintenance Due  Topic Date Due  . MAMMOGRAM  08/12/2018    There are no preventive care reminders to display for this patient.  Lab Results  Component Value Date   TSH 1.31 07/06/2018   Lab Results  Component Value Date   WBC 7.3 07/06/2018   HGB 14.1 07/06/2018   HCT 40.6 07/06/2018   MCV 89.0 07/06/2018   PLT 286 07/06/2018   Lab Results  Component Value Date   NA 138 07/06/2018   K 3.8 07/06/2018   CO2 24 07/06/2018   GLUCOSE 93 07/06/2018   BUN 13 07/06/2018   CREATININE 0.83 07/06/2018   BILITOT 0.3 07/06/2018   ALKPHOS 104 05/28/2015   AST 17 07/06/2018   ALT 24 07/06/2018   PROT 7.4 07/06/2018   ALBUMIN 4.2 05/28/2015   CALCIUM 9.1 07/06/2018   Lab Results  Component Value Date   CHOL 167 05/12/2018   Lab Results  Component Value Date   HDL 51 05/12/2018   Lab Results  Component Value Date   LDLCALC 102 (H) 05/12/2018   Lab Results  Component Value Date   TRIG 62 05/12/2018   Lab Results  Component Value Date   CHOLHDL 3.3 05/12/2018   Lab Results  Component Value Date   HGBA1C 6.0 (H) 05/12/2018      Assessment & Plan:  Tetanus vaccine - Patient tolerated injection well without complications.   Problem List Items Addressed This Visit    None    Visit Diagnoses    Need for Tdap vaccination    -  Primary   Relevant Orders   Tdap vaccine greater than or equal to 7yo IM (Completed)      No orders of the defined types were placed in this encounter.   Follow-up: No follow-ups on file.    Lavell Luster, CMA   Agree with above plan. [place].

## 2018-11-16 ENCOUNTER — Other Ambulatory Visit: Payer: Self-pay

## 2018-11-16 ENCOUNTER — Telehealth: Payer: Self-pay | Admitting: Physician Assistant

## 2018-11-16 ENCOUNTER — Encounter: Payer: Self-pay | Admitting: Sports Medicine

## 2018-11-16 ENCOUNTER — Ambulatory Visit (INDEPENDENT_AMBULATORY_CARE_PROVIDER_SITE_OTHER): Payer: Federal, State, Local not specified - PPO | Admitting: Sports Medicine

## 2018-11-16 ENCOUNTER — Ambulatory Visit (INDEPENDENT_AMBULATORY_CARE_PROVIDER_SITE_OTHER): Payer: Federal, State, Local not specified - PPO

## 2018-11-16 DIAGNOSIS — M79672 Pain in left foot: Secondary | ICD-10-CM

## 2018-11-16 DIAGNOSIS — M7732 Calcaneal spur, left foot: Secondary | ICD-10-CM | POA: Diagnosis not present

## 2018-11-16 MED ORDER — NITROGLYCERIN 0.2 MG/HR TD PT24: MEDICATED_PATCH | TRANSDERMAL | 11 refills | Status: DC

## 2018-11-16 MED FILL — NITROGLYCERIN 0.2 MG/HR PTC: 0.2 | 84 days supply | Qty: 21 | Fill #0

## 2018-11-16 MED FILL — FREESTYLE LIBRE 14 DAY READ: 30 days supply | Qty: 1 | Fill #0

## 2018-11-16 MED FILL — FREESTYLE LIBRE 14 DAY SENS: 28 days supply | Qty: 2 | Fill #0

## 2018-11-16 NOTE — Telephone Encounter (Signed)
Approved today (freestyle meter)  Your PA request has been approved. Additional information will be provided in the approval communication. (Message 1145). Pharmacy and the patient is aware.

## 2018-11-16 NOTE — Assessment & Plan Note (Signed)
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 She did extremely well with the cam boot but did not wear it for quite long enough. Adding topical nitroglycerin, she will wear a boot for a solid month. Adding an x-ray and an MRI to make sure not missing something, she's failed greater than 6 weeks of physician directed conservative measures.

## 2018-11-16 NOTE — Progress Notes (Signed)
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  We have been treating her for insertional Achilles tendinitis, she did extremely well with the boot but did not really wear it that much due to all of the rain.  We also did not do topical nitroglycerin.  She continues to have discomfort but is agreeable to do the boot now for a solid month, start a medication, because we have been treating her now for greater than 6 weeks without improvement she is also agreeable to proceed with an MRI.  I reviewed the past medical history, family history, social history, surgical history, and allergies today and no changes were needed.  Please see the problem list section below in epic for further details.  Past Medical History: Past Medical History:  Diagnosis Date  . Lupus ([place])   . Personal history of amenorrhea   . Sinusitis    Past Surgical History: Past Surgical History:  Procedure Laterality Date  . DILATION AND CURETTAGE OF UTERUS    . TONSILLECTOMY     Social History: Social History   Socioeconomic History  . Marital status: Widowed    Spouse name: Not on file  . Number of children: Not on file  . Years of education: Not on file  . Highest education level: Not on file  Occupational History  . Not on file  Social Needs  . Financial resource strain: Not on file  . Food insecurity    Worry: Not on file    Inability: Not on file  . Transportation needs    Medical: Not on file    Non-medical: Not on file  Tobacco Use  . Smoking status: Never Smoker  . Smokeless tobacco: Never Used  Substance and Sexual Activity  . Alcohol use: No  . Drug use: No  . Sexual activity: Not Currently    Birth control/protection: Pill    Comment: 1st intercourse- 19, partners-1, widow  Lifestyle  . Physical activity    Days per week: Not on file    Minutes per session: Not on file  . Stress: Not on file   Relationships  . Social Herbalist on phone: Not on file    Gets together: Not on file    Attends religious service: Not on file    Active member of club or organization: Not on file    Attends meetings of clubs or organizations: Not on file    Relationship status: Not on file  Other Topics Concern  . Not on file  Social History Narrative  . Not on file   Family History: Family History  Problem Relation Age of Onset  . Cancer Mother        brain  . Hypertension Father   . Diabetes Brother    Allergies: Allergies  Allergen Reactions  . Aspirin Other (See Comments)    GI issues  . Augmentin [Amoxicillin-Pot Clavulanate] Diarrhea  . Ciprofloxacin     Nausea and vomiting.   [name] Hydrocodone     Cough syrup/nausea  . Ivp Dye [Iodinated Diagnostic Agents]    Medications: See med rec.  Review of Systems: No fevers, chills, night sweats, weight loss, chest pain, or shortness of breath.   Objective:    General: Well Developed, well nourished, and in no acute distress.  Neuro: Alert and oriented x3, extra-ocular muscles intact, sensation grossly intact.  HEENT: Normocephalic, atraumatic, pupils  equal round reactive to light, neck supple, no masses, no lymphadenopathy, thyroid nonpalpable.  Skin: Warm and dry, no rashes. Cardiac: Regular rate and rhythm, no murmurs rubs or gallops, no lower extremity edema.  Respiratory: Clear to auscultation bilaterally. Not using accessory muscles, speaking in full sentences.  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 She did extremely well with the cam boot but did not wear it for quite long enough. Adding topical nitroglycerin, she will wear a boot for a solid month. Adding an x-ray and an MRI to make sure not missing something, she's failed greater than 6 weeks of physician directed conservative measures.   ___________________________________________ Gwen Her.  Dianah Field, M.D., ABFM., CAQSM. Primary Care and Sports Medicine [facility]  Adjunct Professor of [employer]

## 2018-11-24 DIAGNOSIS — G4733 Obstructive sleep apnea (adult) (pediatric): Secondary | ICD-10-CM | POA: Diagnosis not present

## 2018-11-28 ENCOUNTER — Ambulatory Visit (INDEPENDENT_AMBULATORY_CARE_PROVIDER_SITE_OTHER): Payer: Federal, State, Local not specified - PPO

## 2018-11-28 ENCOUNTER — Other Ambulatory Visit: Payer: Self-pay

## 2018-11-28 DIAGNOSIS — M79672 Pain in left foot: Secondary | ICD-10-CM | POA: Diagnosis not present

## 2018-11-28 DIAGNOSIS — M722 Plantar fascial fibromatosis: Secondary | ICD-10-CM | POA: Diagnosis not present

## 2018-11-28 DIAGNOSIS — M65872 Other synovitis and tenosynovitis, left ankle and foot: Secondary | ICD-10-CM | POA: Diagnosis not present

## 2018-12-10 ENCOUNTER — Encounter: Payer: Self-pay | Admitting: Family Medicine

## 2018-12-10 ENCOUNTER — Other Ambulatory Visit: Payer: Self-pay

## 2018-12-10 ENCOUNTER — Ambulatory Visit (INDEPENDENT_AMBULATORY_CARE_PROVIDER_SITE_OTHER): Payer: Federal, State, Local not specified - PPO | Admitting: Family Medicine

## 2018-12-10 VITALS — BP 157/99 | HR 92 | Ht 66.0 in | Wt 283.0 lb

## 2018-12-10 DIAGNOSIS — M7521 Bicipital tendinitis, right shoulder: Secondary | ICD-10-CM

## 2018-12-10 MED ORDER — DICLOFENAC SODIUM 1 % TD GEL: 4.0000 g | Freq: Four times a day (QID) | TRANSDERMAL | 11 refills | Status: DC

## 2018-12-10 MED ORDER — PREDNISONE 50 MG PO TABS: 50.0000 mg | ORAL_TABLET | Freq: Every day | ORAL | 0 refills | Status: DC

## 2018-12-10 MED FILL — predniSONE 50 MG TABS: 50 | 5 days supply | Qty: 5 | Fill #0

## 2018-12-10 MED FILL — DICLOFENAC SODIUM 1 % GEL: 1 | 7 days supply | Qty: 100 | Fill #0

## 2018-12-10 NOTE — Patient Instructions (Signed)
Thank you for coming in today. I think this is distal biceps tendonitis.  Take prednisone for 5 days.  Use diclofanc gel as needed.   Home PT.  Take the elbow from the flexed position and allow to straighten out with the hand rotation to hand down position with a 1-2 pound weight.  Also hold a hammer by the handle and slowly turn the hand from palm up to palm down  Do about 30 reps 2x daily.   Let me know if not better.    Distal Biceps Tendinitis Rehab Ask your health care provider which exercises are safe for you. Do exercises exactly as told by your health care provider and adjust them as directed. It is normal to feel mild stretching, pulling, tightness, or discomfort as you do these exercises. Stop right away if you feel sudden pain or your pain gets worse. Do not begin these exercises until told by your health care provider. Stretching and range-of-motion exercises These exercises warm up your muscles and joints and improve the movement and flexibility of your arm. These exercises can also help to relieve pain and stiffness. Elbow range of motion 1. Stand or sit with your left / right elbow bent in a 90-degree angle (right angle). Position your forearm so that the thumb is facing the ceiling (neutral position). 2. Slowly straighten your elbow until you feel a stretch. 3. Hold this position for __________ seconds. 4. Slowly bend your elbow until you feel a stretch, or until you touch your thumb to your shoulder. 5. Hold this position for __________ seconds. Repeat __________ times. Complete this exercise __________ times a day. Forearm rotation, supination 1. Stand up or sit with your left / right elbow bent in a 90-degree angle (right angle). 2. Rotate your palm up until you cannot rotate it anymore (supination). Then, use your other hand to help turn your left / right forearm more. 3. Hold this position for __________ seconds. 4. Slowly return to the starting position. Repeat  __________ times. Complete this exercise __________ times a day. Forearm rotation, pronation 1. Stand up or sit with your left / right elbow bent in a 90-degree angle (right angle). 2. Turn (rotate) your left / right palm down (pronation) until you cannot rotate it anymore. Then, use your other hand to help turn your left / right forearm more. 3. Hold this position for __________ seconds. 4. Slowly return to the starting position. Repeat __________ times. Complete this exercise __________ times a day. Biceps stretch 1. Stand by a door frame. 2. Place your left / right hand on the door frame. Keep your elbow straight during the exercise. Gently turn your body toward the opposite side until you feel a gentle stretch. 3. Hold this position for __________ seconds. 4. Slowly return to the starting position. Repeat __________ times. Complete this exercise __________ times a day. Strengthening exercises These exercises build strength and endurance in your arm and shoulder. Endurance is the ability to use your muscles for a long time, even after they get tired. Forearm rotation, supination  1. Sit with your left / right forearm supported on a table. Your elbow should be at waist height. 2. Gently grasp a lightweight hammer near the head. As this exercise gets easier for you, try holding the hammer farther down the handle. 3. Rest your hand over the edge of the table, palm down. 4. Without moving your left / right elbow, slowly rotate your palm up (supination), stopping when your thumb is  pointed toward the ceiling. 5. Hold this position for__________ seconds. 6. Slowly return to the starting position. Repeat __________ times. Complete this exercise __________ times a day. Forearm rotation, pronation  1. Sit with your left / right forearm supported on a table. Your elbow should be at waist height. 2. Gently grasp a lightweight hammer near the head. As this exercise gets easier for you, try holding  the hammer farther down the handle. 3. Rest your hand over the edge of the table, palm up. 4. Without moving your left / right elbow, slowly rotate your palm down (pronation), stopping when your thumb is pointed toward the floor. 5. Hold this position for __________ seconds. 6. Slowly return to the starting position. Repeat __________ times. Complete this exercise __________ times a day. Biceps curls  1. Sit on a stable chair without armrests, or stand up. 2. If directed, hold a __________ weight in your left / right hand, or hold an exercise band with both hands. Your palms should face up toward the ceiling at the starting position. 3. Bend your left / right elbow and move your hand up toward your shoulder. Keep your other arm straight down, in the starting position. 4. Slowly return to the starting position. Repeat __________ times. Complete this exercise __________ times a day. Elbow extension, supine  1. Lie on your back (supine position). 2. Hold a __________ weight in your left / right hand. 3. Bend your left / right elbow to a 90-degree angle (right angle) so the weight is in front of your face, over your chest, and your elbow is pointed up to the ceiling. 4. Straighten your elbow, raising your hand toward the ceiling (extension). Use your other hand to support your left / right upper arm and to keep it still. 5. Slowly return to the starting position. Repeat __________ times. Complete this exercise __________ times a day. This information is not intended to replace advice given to you by your health care provider. Make sure you discuss any questions you have with your health care provider. Document Released: 03/17/2005 Document Revised: 07/13/2018 Document Reviewed: 03/18/2018 Elsevier Patient Education  2020 [employer].

## 2018-12-10 NOTE — Progress Notes (Signed)
Jennifer Erickson is a 48 y.o. female who presents to [facility] today for right elbow pain.   Right anterior elbow pain present for few days worse recently.  Patient denies any injury or change in activity that might cause elbow pain.  Pain is worse with elbow extension and wrist pronation and supination.  She also has pain with active elbow flexion.  She denies any pain radiating below the level of her elbow weakness or numbness distally.  No neck pain.  No fevers chills nausea vomiting or diarrhea.    ROS:  As above  Exam:  BP (!) 157/99   Pulse 92   Ht 5' 6"  (1.676 m)   Wt 283 lb (128.4 kg)   BMI 45.68 kg/m  Wt Readings from Last 5 Encounters:  12/10/18 283 lb (128.4 kg)  11/16/18 281 lb (127.5 kg)  10/19/18 280 lb (127 kg)  07/23/18 278 lb (126.1 kg)  07/06/18 275 lb (124.7 kg)   General: Well Developed, well nourished, and in no acute distress.  Neuro/Psych: Alert and oriented x3, extra-ocular muscles intact, able to move all 4 extremities, sensation grossly intact. Skin: Warm and dry, no rashes noted.  Respiratory: Not using accessory muscles, speaking in full sentences, trachea midline.  Cardiovascular: Pulses palpable, no extremity edema. Abdomen: Does not appear distended. MSK: Right elbow normal-appearing Normal range of motion but pain with passive extension and active flexion.  Also pain with active and passive pronation and supination. Tender palpation over distal biceps tendon however biceps tendon is intact with normal hook test. Strength is intact elbow motion although painful as above. Pulses cap refill and sensation are intact distally.     Assessment and Plan: 48 y.o. female with right anterior elbow pain very likely distal biceps tendinitis.  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  Plan for short course of prednisone and diclofenac gel.  Reviewed home exercise program.  If not improving patient will return to clinic for recheck and reevaluation.   PDMP reviewed during this encounter. No orders of the defined types were placed in this encounter.  Meds ordered this encounter  Medications  . predniSONE (DELTASONE) 50 MG tablet    Sig: Take 1 tablet (50 mg total) by mouth daily.    Dispense:  5 tablet    Refill:  0  . diclofenac sodium (VOLTAREN) 1 % GEL    Sig: Apply 4 g topically 4 (four) times daily. To affected joint.    Dispense:  100 g    Refill:  11    Historical information moved to improve visibility of documentation.  Past Medical History:  Diagnosis Date  . Lupus ([place])   . Personal history of amenorrhea   . Sinusitis    Past Surgical History:  Procedure Laterality Date  . DILATION AND CURETTAGE OF UTERUS    . TONSILLECTOMY     Social History   Tobacco Use  . Smoking status: Never Smoker  . Smokeless tobacco: Never Used  Substance Use Topics  . Alcohol use: No   family history includes Cancer in her mother; Diabetes in her brother; Hypertension in her father.  Medications: Current Outpatient Medications  Medication Sig Dispense Refill  . albuterol (PROVENTIL HFA;VENTOLIN HFA) 108 (90 Base) MCG/ACT inhaler Inhale 2 puffs into the lungs every 6 (six) hours as needed for  wheezing or shortness of breath. 1 Inhaler 1  . AMBULATORY NON FORMULARY MEDICATION Continuous positive airway pressure (CPAP) machine auto-titrate from 4-20 cm of H2O pressure, with all supplemental supplies as needed.  Send 30 day report to Dr Georgina Snell [phone] after 30 days 1 each 0  . Continuous Blood Gluc Receiver (FREESTYLE LIBRE 14 DAY READER) DEVI 1 application by Does not apply route every 14 (fourteen) days. Use with sensor system 1 Device 11  . Continuous Blood Gluc Sensor (FREESTYLE LIBRE 14 DAY SENSOR) MISC 1 application by Does not apply route every 14  (fourteen) days. Apply upper deltoid every 14 days, use reader to determine blood sugars 2 each 11  . EPINEPHrine (EPIPEN 2-PAK) 0.3 mg/0.3 mL IJ SOAJ injection INJECT 0.3 MLS (0.3 MG TOTAL) INTO THE MUSCLE ONCE. 1 Device 4  . etonogestrel (IMPLANON) 68 MG IMPL implant Inject 1 each into the skin once.    . fluticasone (FLONASE) 50 MCG/ACT nasal spray One spray in each nostril twice a day, use left hand for right nostril, and right hand for left nostril. 48 g 3  . hydroxychloroquine (PLAQUENIL) 200 MG tablet Take 200 mg by mouth 2 (two) times daily.  3  . nitroGLYCERIN (NITRODUR - DOSED IN MG/24 HR) 0.2 mg/hr patch Cut and apply 1/4 patch to most painful area q24h. 30 patch 11  . diclofenac sodium (VOLTAREN) 1 % GEL Apply 4 g topically 4 (four) times daily. To affected joint. 100 g 11  . predniSONE (DELTASONE) 50 MG tablet Take 1 tablet (50 mg total) by mouth daily. 5 tablet 0   No current facility-administered medications for this visit.    Allergies  Allergen Reactions  . Aspirin Other (See Comments)    GI issues  . Augmentin [Amoxicillin-Pot Clavulanate] Diarrhea  . Ciprofloxacin     Nausea and vomiting.   [name] Hydrocodone     Cough syrup/nausea  . Ivp Dye [Iodinated Diagnostic Agents]       Discussed warning signs or symptoms. Please see discharge instructions. Patient expresses understanding.

## 2018-12-17 MED FILL — FREESTYLE LIBRE 14 DAY SENS: 28 days supply | Qty: 2 | Fill #1

## 2018-12-22 ENCOUNTER — Other Ambulatory Visit: Payer: Self-pay

## 2018-12-22 ENCOUNTER — Ambulatory Visit (INDEPENDENT_AMBULATORY_CARE_PROVIDER_SITE_OTHER): Payer: Federal, State, Local not specified - PPO | Admitting: Obstetrics & Gynecology

## 2018-12-22 ENCOUNTER — Encounter: Payer: Self-pay | Admitting: Obstetrics & Gynecology

## 2018-12-22 DIAGNOSIS — Z3046 Encounter for surveillance of implantable subdermal contraceptive: Secondary | ICD-10-CM

## 2018-12-22 DIAGNOSIS — Z30017 Encounter for initial prescription of implantable subdermal contraceptive: Secondary | ICD-10-CM | POA: Diagnosis not present

## 2018-12-22 NOTE — Progress Notes (Signed)
Jennifer Erickson 09/02/1970 [id number]        48 y.o.  G0P0000   RP: Removal and Insertion of Nexplanon  HPI: Well on Nexplanon, time to change.  No pelvic pain.  No BTB.   OB History  Gravida Para Term Preterm AB Living  0 0 0 0 0 0  SAB TAB Ectopic Multiple Live Births  0 0 0 0 0    Past medical history,surgical history, problem list, medications, allergies, family history and social history were all reviewed and documented in the EPIC chart.   Directed ROS with pertinent positives and negatives documented in the history of present illness/assessment and plan.  Exam:  There were no vitals filed for this visit. General appearance:  Normal                                                             Nexplanon procedure note (removal)  The patient presented to the office today requesting for removal of her Nexplanon that was placed in the year 11/2015 on her non-dominant arm.   On examination the nexplanon implant was palpated and the distal end  (end  closest to the elbow) was marked. The area was sterilized with Betadine solution. 1% lidocaine was used for local anesthesia and approximately 1 cc  was injected into the site that was marked where the incision was to be made. The local anesthetic was injected under the implant in an effort to keep it  close to the skin surface. Slight pressure pushing downward was made at the proximal end  of the implant in an effort to stabilize it. A bulge appeared indicating the distal end of the implant. A small transverse incision of 2 mm was made at that location. By gently pushing the implant toward the incision, the tip became visible. Grasping the implant with a curved forcep facilitated in gently removing the implant. Full confirmation of the entire implant which is 4 cm long was inspected and was intact and was shown to the patient and discarded. After removing the implant, the incision was closed with 3Steri-Strips, a band-aid and a  bandage. Patient will be instructed to remove the pressure bandage in 24 hours, the band-aid in 3 days and the Steri-Strips in 7 days.                                              Nexplanon Procedure Note (insertion)   The patient was laying on her back with her nondominant arm flexed at the elbow and externally rotated. The insertion site was identified as the underside of the nondominant upper arm approximately 8 cm from the medial epicondyle of the humerus.  A mark was made with a sterile marker at the spot where the Nexplanon implant will be inserted. The area was cleansed with Betadine solution. The area was anesthetized with 1% lidocaine  (1 cc)  at the area the injection site and underneath the skin along the planned insertion tunnel. The preloaded disposable Nexplanon was removed from its sterile casing.  The applicator was held above the needle at the textured surface area. The transparent protector was removed. With a freehand,  the skin was stretched around the insertion site with a thumb and index finger. The skin was then punctured with the tip of the needle angled at 30. The Nexplanon applicator was lowered to a horizontal position. While lifting the skin with the tip of the needle the needle was then slid to its full length. The applicator was kept in this position with the needle inserted to its full length. The purple slider was unlocked by pushing it slightly downward. The slider was fully moved back until it stopped. This allowed the implant to be in the final subdermal position and the needle to be locked inside the body of the applicator. The applicator was then removed. 3 Steri-Strips were applied over the incision, a band-aid and a bandage was placed which the patient is to remove tomorrow. No complications, the patient tolerated procedure well and was released home with instructions.   Assessment/Plan:  48 y.o. G0P0000   1. Encounter for Nexplanon removal Easy removal of Nexplanon  after 3 yrs.  No complication, well tolerated by patient.  2. Insertion of Nexplanon Easy insertion of new Nexplanon.  No difficulty, no complication.  Post removal/insertion precautions reviewed.  Princess Bruins MD, 2:19 PM 12/22/2018

## 2018-12-25 ENCOUNTER — Encounter: Payer: Self-pay | Admitting: Obstetrics & Gynecology

## 2018-12-25 DIAGNOSIS — G4733 Obstructive sleep apnea (adult) (pediatric): Secondary | ICD-10-CM | POA: Diagnosis not present

## 2018-12-25 NOTE — Patient Instructions (Signed)
1. Encounter for Nexplanon removal Easy removal of Nexplanon after 3 yrs.  No complication, well tolerated by patient.  2. Insertion of Nexplanon Easy insertion of new Nexplanon.  No difficulty, no complication.  Post removal/insertion precautions reviewed.  Jennifer Erickson, it was a pleasure seeing you today!

## 2018-12-27 ENCOUNTER — Encounter: Payer: Federal, State, Local not specified - PPO | Admitting: Obstetrics & Gynecology

## 2018-12-31 ENCOUNTER — Encounter: Payer: Self-pay | Admitting: Anesthesiology

## 2019-01-05 ENCOUNTER — Other Ambulatory Visit: Payer: Self-pay

## 2019-01-05 ENCOUNTER — Ambulatory Visit (INDEPENDENT_AMBULATORY_CARE_PROVIDER_SITE_OTHER): Payer: Federal, State, Local not specified - PPO | Admitting: Sports Medicine

## 2019-01-05 DIAGNOSIS — Z23 Encounter for immunization: Secondary | ICD-10-CM

## 2019-01-17 ENCOUNTER — Telehealth: Payer: Federal, State, Local not specified - PPO | Admitting: Family

## 2019-01-17 ENCOUNTER — Other Ambulatory Visit: Payer: Self-pay

## 2019-01-17 DIAGNOSIS — R05 Cough: Secondary | ICD-10-CM

## 2019-01-17 DIAGNOSIS — R059 Cough, unspecified: Secondary | ICD-10-CM

## 2019-01-17 DIAGNOSIS — Z20822 Contact with and (suspected) exposure to covid-19: Secondary | ICD-10-CM

## 2019-01-17 NOTE — Progress Notes (Signed)
E-Visit for Corona Virus Screening   Your current symptoms could be consistent with the coronavirus.  Many health care providers can now test patients at their office but not all are.  [place] has multiple testing sites. For information on our COVID testing locations and hours go to [email or web address]  Please quarantine yourself while awaiting your test results.     You can go to one of the  testing sites listed below, while they are opened (see hours). You do not need an order and will stay in your car during the test. You do need to self isolate until your results return and if positive 14 days from when your symptoms started and until you are 3 days symptom free.   Testing Locations (Monday - Friday, 8 a.m. - 3:30 p.m.) . [place]: [facility] at [facility], [address] [place], [place]: [place], [place], [place], [place] (entrance off [address])  . [facility]: (Closed each Monday): Testing site relocated to the short stay covered drive at [facility]. (Use the [facility] entrance to [facility] next to [place] is a respiratory illness with symptoms that are similar to the flu. Symptoms are typically mild to moderate, but there have been cases of severe illness and death due to the virus. The following symptoms may appear 2-14 days after exposure: . Fever . Cough . Shortness of breath or difficulty breathing . Chills . Repeated shaking with chills . Muscle pain . Headache . Sore throat . New loss of taste or smell . Fatigue . Congestion or runny nose . Nausea or vomiting . Diarrhea  It is vitally important that if you feel that you have an infection such as this virus or any other virus that you stay home and away from places where you may spread it to others.  You should self-quarantine for 14 days if you have symptoms that  could potentially be coronavirus or have been in close contact a with a person diagnosed with COVID-19 within the last 2 weeks. You should avoid contact with people age 51 and older.   You should wear a mask or cloth face covering over your nose and mouth if you must be around other people or animals, including pets (even at home). Try to stay at least 6 feet away from other people. This will protect the people around you.  You may also take acetaminophen (Tylenol) as needed for fever.   Reduce your risk of any infection by using the same precautions used for avoiding the common cold or flu:  [name] Wash your hands often with soap and warm water for at least 20 seconds.  If soap and water are not readily available, use an alcohol-based hand sanitizer with at least 60% alcohol.  . If coughing or sneezing, cover your mouth and nose by coughing or sneezing into the elbow areas of your shirt or coat, into a tissue or into your sleeve (not your hands). . Avoid shaking hands with others and consider head nods or verbal greetings only. . Avoid touching your eyes, nose, or mouth with unwashed hands.  . Avoid close contact with people who are sick. . Avoid places or events with large numbers of people in one location, like concerts or sporting events. . Carefully consider travel plans you have or are making. . If you are planning any travel outside or inside the [place],  visit the CDC's Travelers' Health webpage for the latest health notices. . If you have some symptoms but not all symptoms, continue to monitor at home and seek medical attention if your symptoms worsen. . If you are having a medical emergency, call 911.  HOME CARE . Only take medications as instructed by your medical team. . Drink plenty of fluids and get plenty of rest. . A steam or ultrasonic humidifier can help if you have congestion.   GET HELP RIGHT AWAY IF YOU HAVE EMERGENCY WARNING SIGNS** FOR COVID-19. If you or someone is showing any  of these signs seek emergency medical care immediately. Call 911 or proceed to your closest emergency facility if: . You develop worsening high fever. . Trouble breathing . Bluish lips or face . Persistent pain or pressure in the chest . New confusion . Inability to wake or stay awake . You cough up blood. . Your symptoms become more severe  **This list is not all possible symptoms. Contact your medical provider for any symptoms that are sever or concerning to you.   MAKE SURE YOU   Understand these instructions.  Will watch your condition.  Will get help right away if you are not doing well or get worse.  Your e-visit answers were reviewed by a board certified advanced clinical practitioner to complete your personal care plan.  Depending on the condition, your plan could have included both over the counter or prescription medications.  If there is a problem please reply once you have received a response from your provider.  Your safety is important to [place].  If you have drug allergies check your prescription carefully.    You can use MyChart to ask questions about today's visit, request a non-urgent call back, or ask for a work or school excuse for 24 hours related to this e-Visit. If it has been greater than 24 hours you will need to follow up with your provider, or enter a new e-Visit to address those concerns. You will get an e-mail in the next two days asking about your experience.  I hope that your e-visit has been valuable and will speed your recovery. Thank you for using e-visits.   Approximately 5 minutes was spent documenting and reviewing patient's chart.

## 2019-01-19 LAB — NOVEL CORONAVIRUS, NAA: SARS-CoV-2, NAA: NOT DETECTED

## 2019-01-24 ENCOUNTER — Other Ambulatory Visit: Payer: Self-pay

## 2019-01-24 ENCOUNTER — Telehealth (INDEPENDENT_AMBULATORY_CARE_PROVIDER_SITE_OTHER): Payer: Federal, State, Local not specified - PPO | Admitting: Family Medicine

## 2019-01-24 DIAGNOSIS — J0101 Acute recurrent maxillary sinusitis: Secondary | ICD-10-CM

## 2019-01-24 MED ORDER — CEFDINIR 300 MG PO CAPS: 300.0000 mg | ORAL_CAPSULE | Freq: Two times a day (BID) | ORAL | 0 refills | Status: DC

## 2019-01-24 MED ORDER — PREDNISONE 10 MG PO TABS: 30.0000 mg | ORAL_TABLET | Freq: Every day | ORAL | 0 refills | Status: DC

## 2019-01-24 MED FILL — CEFDINIR 300 MG CAPSULE: 300 | 7 days supply | Qty: 14 | Fill #0

## 2019-01-24 MED FILL — predniSONE 10 MG TABS: 10 | 5 days supply | Qty: 15 | Fill #0

## 2019-01-24 NOTE — Progress Notes (Signed)
Virtual Visit  via Video Note  I connected with      Jennifer Erickson by a video enabled telemedicine application and verified that I am speaking with the correct person using two identifiers.   I discussed the limitations of evaluation and management by telemedicine and the availability of in person appointments. The patient expressed understanding and agreed to proceed.  History of Present Illness: Jennifer Erickson is a 48 y.o. female who would like to discuss recent illness.  Patient had a cough and was seen via E visit on October 19.  She had a negative Covid test around about the same time.  Since then she has been unable to return to work because she needs a letter stating that she may return to work.  She is overall is feeling a lot better but still having some headache and sinus pain and pressure and nasal discharge.  She thinks she has developed a sinus infection.  She notes the cough is dramatically improved.  No fevers chills shortness of breath nausea vomiting or diarrhea.  Covid test was negative on October 19.   Observations/Objective: Exam: Appearance nontoxic Normal Speech.  No tachypnea or shortness of breath.  Able to complete sentences.    Assessment and Plan: 48 y.o. female with likely developing sinusitis.  Second sickening.  Covid very unlikely given negative test.  Treat with prednisone and Omnicef.  Continue over-the-counter medications.  Work note provided.  Recheck as needed.  PDMP not reviewed this encounter. No orders of the defined types were placed in this encounter.  Meds ordered this encounter  Medications  . cefdinir (OMNICEF) 300 MG capsule    Sig: Take 1 capsule (300 mg total) by mouth 2 (two) times daily.    Dispense:  14 capsule    Refill:  0  . predniSONE (DELTASONE) 10 MG tablet    Sig: Take 3 tablets (30 mg total) by mouth daily with breakfast.    Dispense:  15 tablet    Refill:  0    Follow Up Instructions:    I  discussed the assessment and treatment plan with the patient. The patient was provided an opportunity to ask questions and all were answered. The patient agreed with the plan and demonstrated an understanding of the instructions.   The patient was advised to call back or seek an in-person evaluation if the symptoms worsen or if the condition fails to improve as anticipated.  Time: 15 minutes of intraservice time, with >22 minutes of total time during today's visit.      Historical information moved to improve visibility of documentation.  Past Medical History:  Diagnosis Date  . Lupus ([place])   . Personal history of amenorrhea   . Sinusitis    Past Surgical History:  Procedure Laterality Date  . DILATION AND CURETTAGE OF UTERUS    . TONSILLECTOMY     Social History   Tobacco Use  . Smoking status: Never Smoker  . Smokeless tobacco: Never Used  Substance Use Topics  . Alcohol use: No   family history includes Cancer in her mother; Diabetes in her brother; Hypertension in her father.  Medications: Current Outpatient Medications  Medication Sig Dispense Refill  . albuterol (PROVENTIL HFA;VENTOLIN HFA) 108 (90 Base) MCG/ACT inhaler Inhale 2 puffs into the lungs every 6 (six) hours as needed for wheezing or shortness of breath. 1 Inhaler 1  . AMBULATORY NON FORMULARY MEDICATION Continuous positive airway pressure (CPAP) machine auto-titrate from 4-20  cm of H2O pressure, with all supplemental supplies as needed.  Send 30 day report to Dr Georgina Snell [phone] after 30 days 1 each 0  . cefdinir (OMNICEF) 300 MG capsule Take 1 capsule (300 mg total) by mouth 2 (two) times daily. 14 capsule 0  . Continuous Blood Gluc Receiver (FREESTYLE LIBRE 14 DAY READER) DEVI 1 application by Does not apply route every 14 (fourteen) days. Use with sensor system 1 Device 11  . Continuous Blood Gluc Sensor (FREESTYLE LIBRE 14 DAY SENSOR) MISC 1 application by Does not apply route every 14 (fourteen) days.  Apply upper deltoid every 14 days, use reader to determine blood sugars 2 each 11  . diclofenac sodium (VOLTAREN) 1 % GEL Apply 4 g topically 4 (four) times daily. To affected joint. 100 g 11  . EPINEPHrine (EPIPEN 2-PAK) 0.3 mg/0.3 mL IJ SOAJ injection INJECT 0.3 MLS (0.3 MG TOTAL) INTO THE MUSCLE ONCE. 1 Device 4  . etonogestrel (IMPLANON) 68 MG IMPL implant Inject 1 each into the skin once.    . fluticasone (FLONASE) 50 MCG/ACT nasal spray One spray in each nostril twice a day, use left hand for right nostril, and right hand for left nostril. 48 g 3  . hydroxychloroquine (PLAQUENIL) 200 MG tablet Take 200 mg by mouth 2 (two) times daily.  3  . nitroGLYCERIN (NITRODUR - DOSED IN MG/24 HR) 0.2 mg/hr patch Cut and apply 1/4 patch to most painful area q24h. 30 patch 11  . predniSONE (DELTASONE) 10 MG tablet Take 3 tablets (30 mg total) by mouth daily with breakfast. 15 tablet 0   No current facility-administered medications for this visit.    Allergies  Allergen Reactions  . Aspirin Other (See Comments)    GI issues  . Augmentin [Amoxicillin-Pot Clavulanate] Diarrhea  . Ciprofloxacin     Nausea and vomiting.   [name] Hydrocodone     Cough syrup/nausea  . Ivp Dye [Iodinated Diagnostic Agents]

## 2019-01-26 MED FILL — ALBUTEROL SULFATE HFA 108 (: 108 (90 BAS | 25 days supply | Qty: 9 | Fill #1

## 2019-03-09 ENCOUNTER — Encounter: Payer: Federal, State, Local not specified - PPO | Admitting: Obstetrics & Gynecology

## 2019-03-15 DIAGNOSIS — G4733 Obstructive sleep apnea (adult) (pediatric): Secondary | ICD-10-CM | POA: Diagnosis not present

## 2019-03-23 DIAGNOSIS — L930 Discoid lupus erythematosus: Secondary | ICD-10-CM | POA: Diagnosis not present

## 2019-03-23 DIAGNOSIS — L669 Cicatricial alopecia, unspecified: Secondary | ICD-10-CM | POA: Diagnosis not present

## 2019-03-23 DIAGNOSIS — L853 Xerosis cutis: Secondary | ICD-10-CM | POA: Diagnosis not present

## 2019-03-23 MED FILL — BETAMETHASONE DIPROPIONATE: 0.05 | 30 days supply | Qty: 45 | Fill #0

## 2019-03-30 MED FILL — FREESTYLE LIBRE 14 DAY SENS: 28 days supply | Qty: 2 | Fill #2

## 2019-03-31 ENCOUNTER — Telehealth: Payer: Self-pay | Admitting: Physician Assistant

## 2019-03-31 NOTE — Telephone Encounter (Signed)
Received fax from [employer] that [facility] requires a PA. Information has been sent to the insurance company. Awaiting determination.

## 2019-04-04 DIAGNOSIS — H40013 Open angle with borderline findings, low risk, bilateral: Secondary | ICD-10-CM | POA: Diagnosis not present

## 2019-04-05 DIAGNOSIS — Z79899 Other long term (current) drug therapy: Secondary | ICD-10-CM | POA: Diagnosis not present

## 2019-04-08 MED FILL — FREESTYLE LIBRE 14 DAY SENS: 28 days supply | Qty: 2 | Fill #2

## 2019-04-08 NOTE — Telephone Encounter (Signed)
Your PA request has been closed. Thank you for the ePA request for Freestyle Libre 14 day reader. We have reviewed the request and based on the information submitted, no Prior Authorization is needed at this time. The member can check the status of their Prior Approval online at any time by going to [email or web address] Thank you.

## 2019-04-26 IMAGING — DX DG SHOULDER 2+V*R*
3 series · 3 of 3 positions shown · non-contrast
Comparison: Chest x-ray of January 15, 2012 which included portions
of the right shoulder.

CLINICAL DATA: Bilateral shoulder pain for several months with no
known injury.

EXAM:
RIGHT SHOULDER - 2+ VIEW

[shoulder grashey]
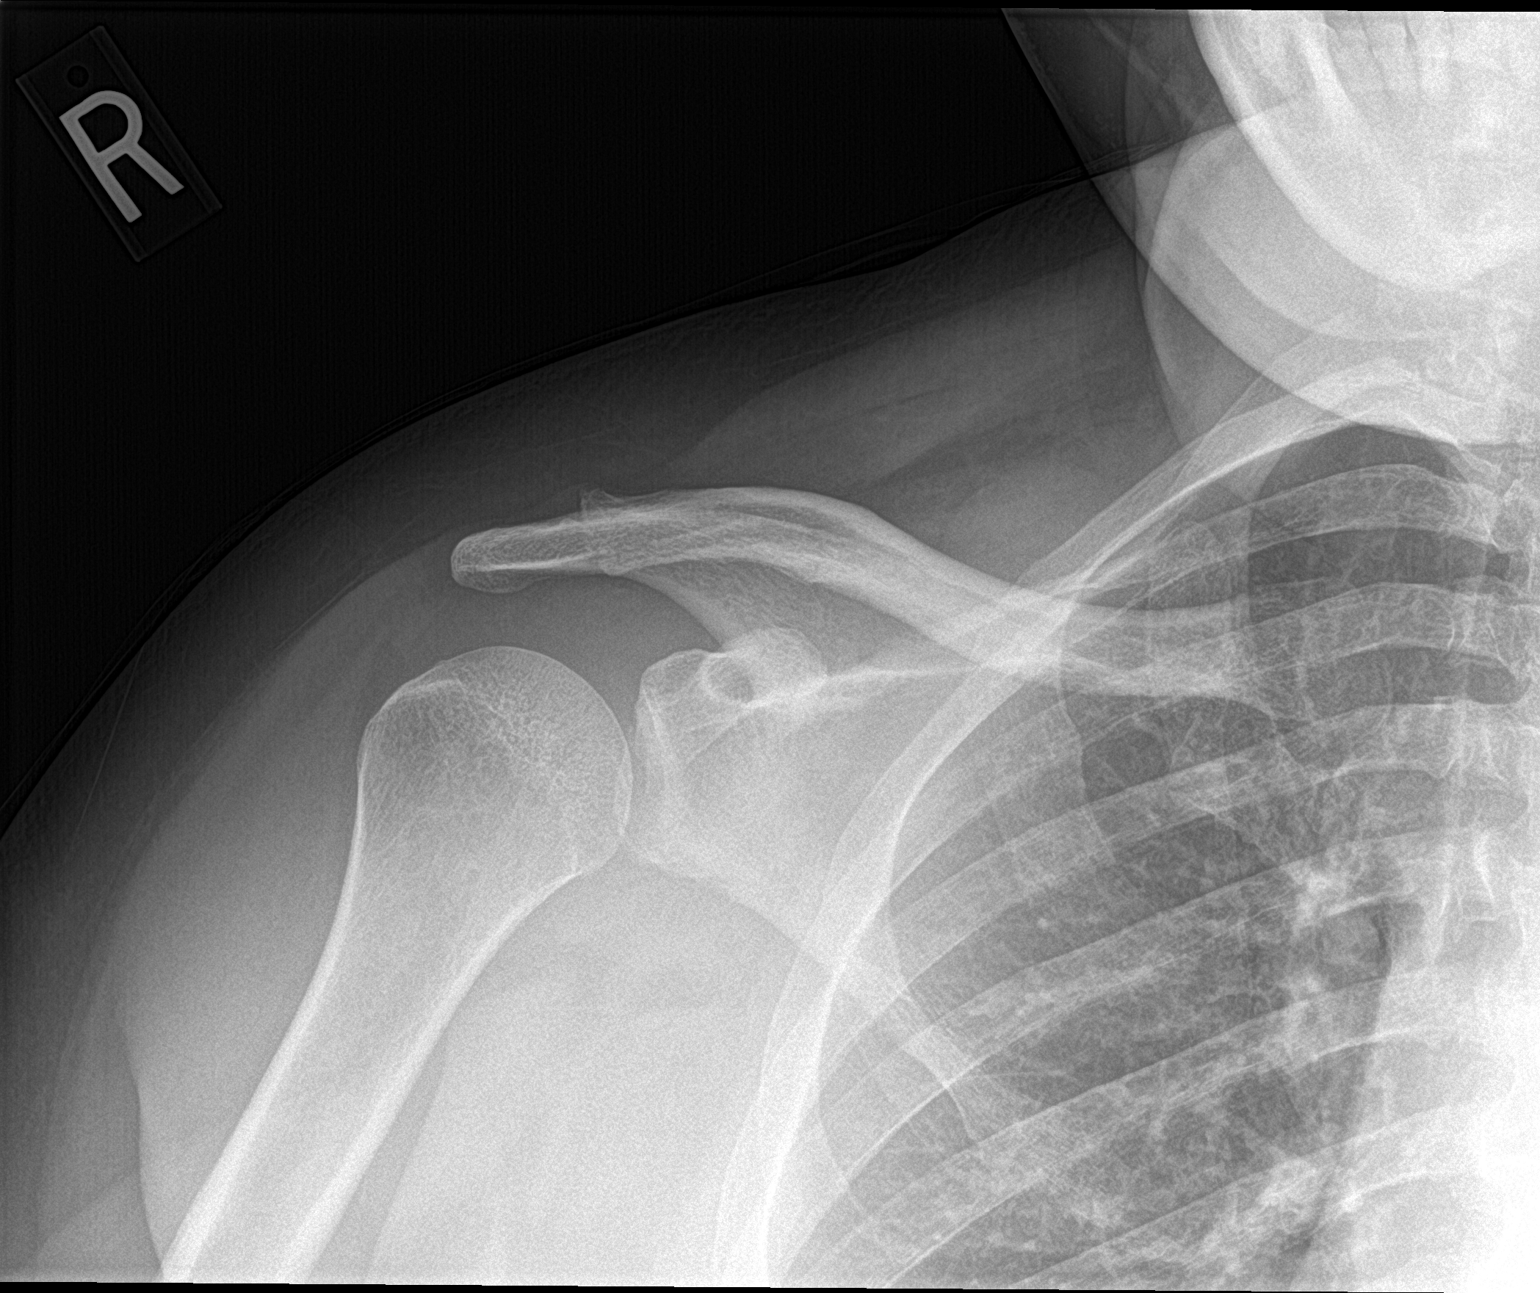

[shoulder y view]
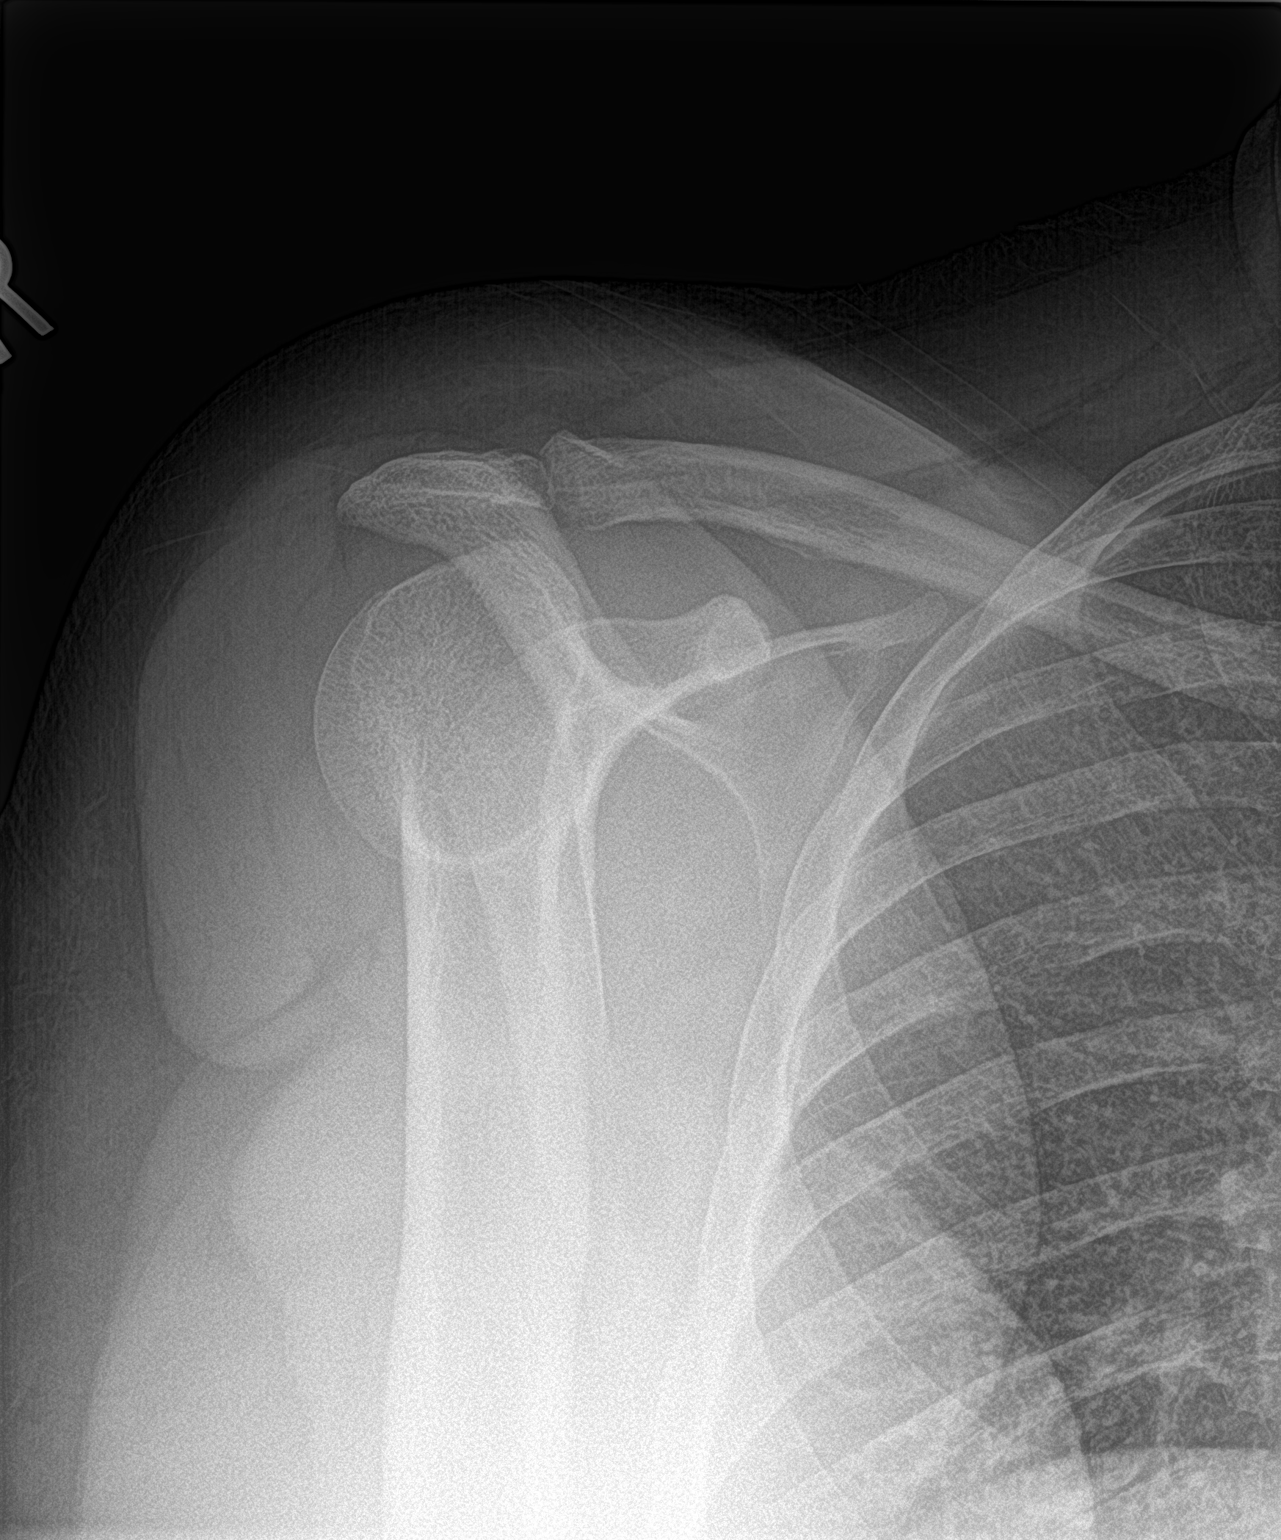

[shoulder axillary]
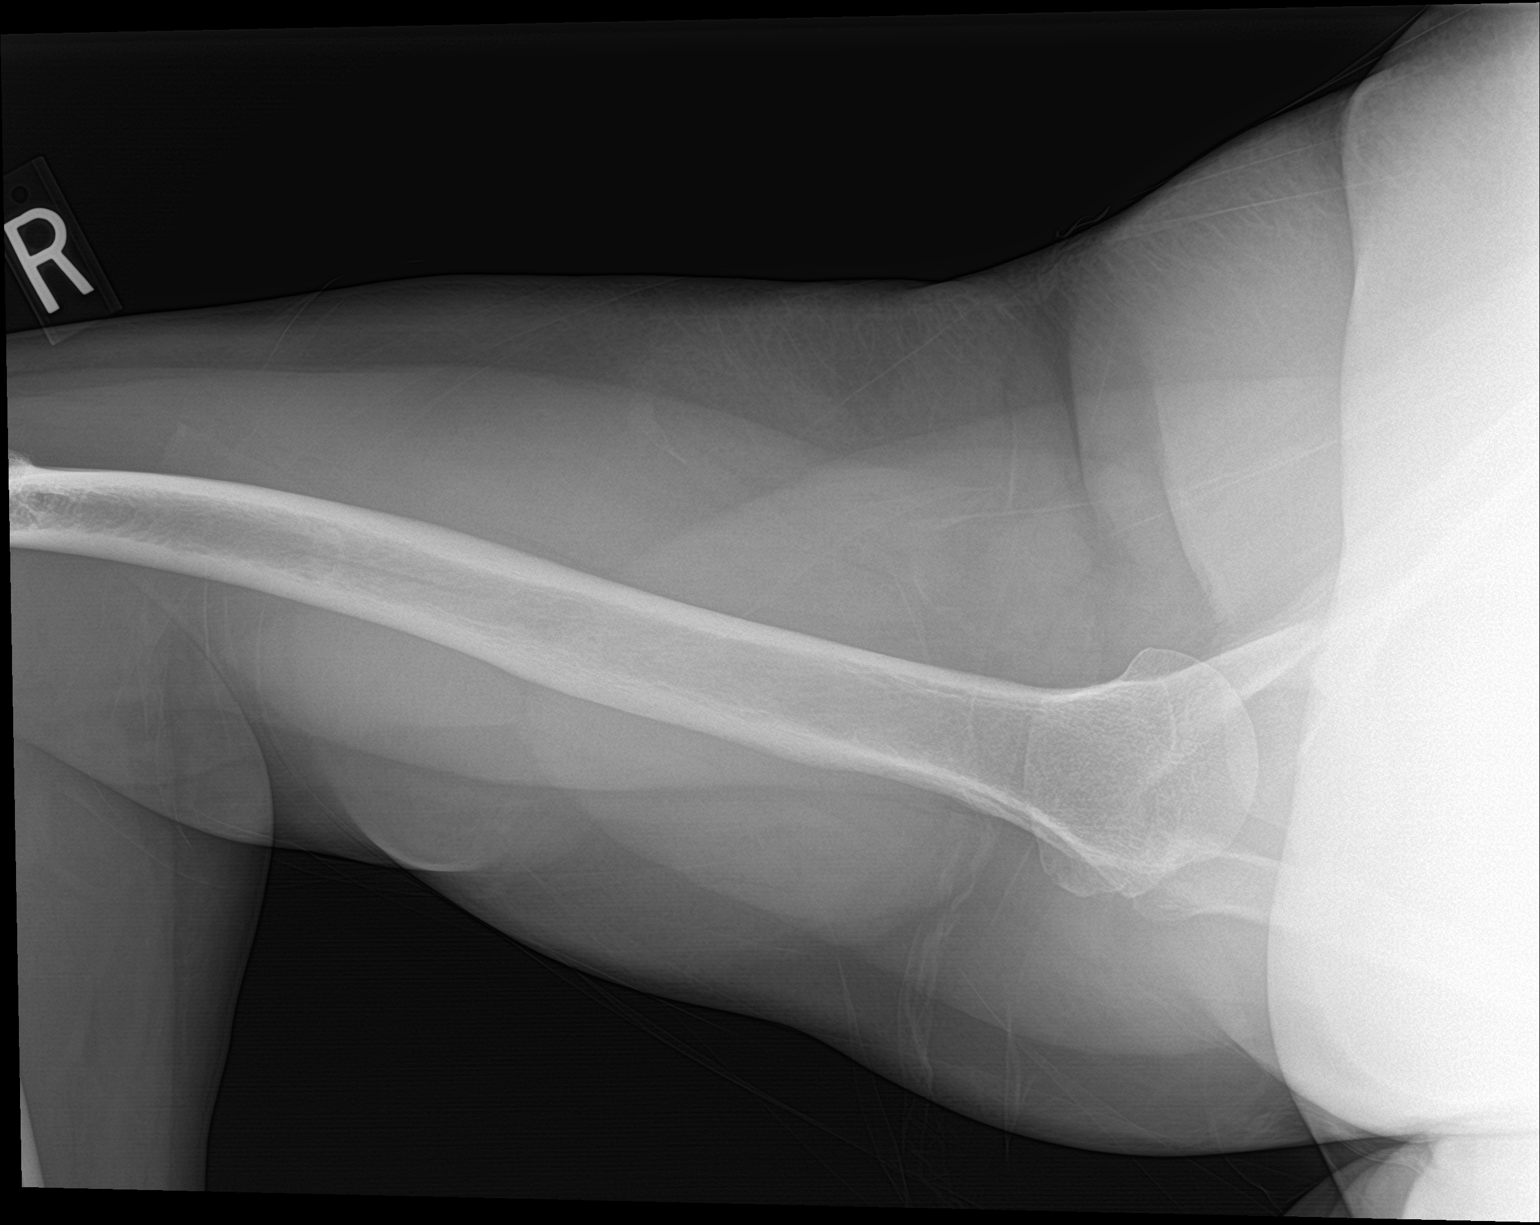

[3 of 3 positions shown; findings below may reference images not displayed]

FINDINGS: The bones are subjectively adequately mineralized. There is no acute
or healing fracture. The glenohumeral joint space is well
maintained. The subacromial subdeltoid space appears normal. No
significant AC joint degenerative changes observed. The observed
portions of the right clavicle are normal.
IMPRESSION: There is no acute or significant chronic bony abnormality of the
right shoulder.

## 2019-05-16 DIAGNOSIS — L930 Discoid lupus erythematosus: Secondary | ICD-10-CM | POA: Diagnosis not present

## 2019-05-16 DIAGNOSIS — Z79899 Other long term (current) drug therapy: Secondary | ICD-10-CM | POA: Diagnosis not present

## 2019-05-30 ENCOUNTER — Encounter: Payer: Federal, State, Local not specified - PPO | Admitting: Obstetrics & Gynecology

## 2019-06-06 ENCOUNTER — Telehealth (INDEPENDENT_AMBULATORY_CARE_PROVIDER_SITE_OTHER): Payer: Federal, State, Local not specified - PPO | Admitting: Sports Medicine

## 2019-06-06 ENCOUNTER — Encounter: Payer: Self-pay | Admitting: Sports Medicine

## 2019-06-06 DIAGNOSIS — M51369 Other intervertebral disc degeneration, lumbar region without mention of lumbar back pain or lower extremity pain: Secondary | ICD-10-CM | POA: Insufficient documentation

## 2019-06-06 DIAGNOSIS — L930 Discoid lupus erythematosus: Secondary | ICD-10-CM

## 2019-06-06 DIAGNOSIS — M5136 Other intervertebral disc degeneration, lumbar region: Secondary | ICD-10-CM | POA: Diagnosis not present

## 2019-06-06 NOTE — Assessment & Plan Note (Addendum)
Jennifer Erickson has been on hydroxychloroquine 200 mg twice daily now, she feels significantly better, she is more energetic, her joint aches for the most part are gone. Happy with how things are going.

## 2019-06-06 NOTE — Assessment & Plan Note (Signed)
For the past week to week and a half this pleasant 49 year old female has had an odd sensation like water trickling down her left thigh, radiating from her upper lateral hip to approximately three quarters of the way down the lateral thigh but not past the knee.  No numbness, tingling, she did initially have some back pain with this. I did review an old lumbar spine MRI from 10 years ago that did show a large broad-based L5-S1 disc protrusion. Back pain has improved, and overall her other symptoms have improved as well. Because of the improvement we are going to start conservatively with lumbar spine rehabilitation exercises, we can follow this up in a month, with advanced imaging if no better.

## 2019-06-06 NOTE — Progress Notes (Signed)
   Virtual Visit via WebEx/MyChart   I connected with  Jennifer Erickson  on 06/06/19 via WebEx/MyChart/Doximity Video and verified that I am speaking with the correct person using two identifiers.   I discussed the limitations, risks, security and privacy concerns of performing an evaluation and management service by WebEx/MyChart/Doximity Video, including the higher likelihood of inaccurate diagnosis and treatment, and the availability of in person appointments.  We also discussed the likely need of an additional face to face encounter for complete and high quality delivery of care.  I also discussed with the patient that there may be a patient responsible charge related to this service. The patient expressed understanding and wishes to proceed.  Provider location is either at home or medical facility. Patient location is at their home, different from provider location. People involved in care of the patient during this telehealth encounter were myself, my nurse/medical assistant, and my front office/scheduling team member.  Review of Systems: No fevers, chills, night sweats, weight loss, chest pain, or shortness of breath.   Objective Findings:    General: Speaking full sentences, no audible heavy breathing.  Sounds alert and appropriately interactive.  Appears well.  Face symmetric.  Extraocular movements intact.  Pupils equal and round.  No nasal flaring or accessory muscle use visualized.  Independent interpretation of tests performed by another provider:   I did personally review her lumbar spine MRI from 10 years ago, she does have a broad-based L5-S1 disc fusion resulting in only mild L5-S1 bilateral foraminal stenosis but there is contact with the intraspinal S1 nerve roots with the disc protrusion.  Impression and Recommendations:    Lumbar degenerative disc disease For the past week to week and a half this pleasant 49 year old female has had an odd sensation like water  trickling down her left thigh, radiating from her upper lateral hip to approximately three quarters of the way down the lateral thigh but not past the knee.  No numbness, tingling, she did initially have some back pain with this. I did review an old lumbar spine MRI from 10 years ago that did show a large broad-based L5-S1 disc protrusion. Back pain has improved, and overall her other symptoms have improved as well. Because of the improvement we are going to start conservatively with lumbar spine rehabilitation exercises, we can follow this up in a month, with advanced imaging if no better.  Discoid lupus erythematosus Jennifer Erickson has been on hydroxychloroquine 200 mg twice daily now, she feels significantly better, she is more energetic, her joint aches for the most part are gone. Happy with how things are going.   I discussed the above assessment and treatment plan with the patient. The patient was provided an opportunity to ask questions and all were answered. The patient agreed with the plan and demonstrated an understanding of the instructions.   The patient was advised to call back or seek an in-person evaluation if the symptoms worsen or if the condition fails to improve as anticipated.   I provided 30 minutes of face to face and non-face-to-face time during this encounter date, time was needed to gather information, review chart, records, communicate/coordinate with staff remotely, as well as complete documentation.   ___________________________________________ Gwen Her. Dianah Field, M.D., ABFM., CAQSM. Primary Care and [place] Instructor of [place] [employer]

## 2019-06-25 DIAGNOSIS — Z20828 Contact with and (suspected) exposure to other viral communicable diseases: Secondary | ICD-10-CM | POA: Diagnosis not present

## 2019-06-25 DIAGNOSIS — Z20822 Contact with and (suspected) exposure to covid-19: Secondary | ICD-10-CM | POA: Diagnosis not present

## 2019-07-19 MED FILL — HYDROXYCHLOROQUINE 200 MG T: 200 | 30 days supply | Qty: 60 | Fill #0

## 2019-07-20 ENCOUNTER — Encounter: Payer: Federal, State, Local not specified - PPO | Admitting: Obstetrics & Gynecology

## 2019-07-20 ENCOUNTER — Other Ambulatory Visit: Payer: Self-pay

## 2019-08-04 ENCOUNTER — Other Ambulatory Visit: Payer: Self-pay | Admitting: Sports Medicine

## 2019-08-04 ENCOUNTER — Other Ambulatory Visit (HOSPITAL_BASED_OUTPATIENT_CLINIC_OR_DEPARTMENT_OTHER): Payer: Self-pay | Admitting: Sports Medicine

## 2019-08-04 DIAGNOSIS — E8881 Metabolic syndrome: Secondary | ICD-10-CM

## 2019-08-04 MED FILL — HYDROXYCHLOROQUINE 200 MG T: 200 | 30 days supply | Qty: 60 | Fill #0

## 2019-08-04 MED FILL — FREESTYLE LIBRE 14 DAY SENS: 28 days supply | Qty: 2 | Fill #0

## 2019-08-16 ENCOUNTER — Ambulatory Visit (INDEPENDENT_AMBULATORY_CARE_PROVIDER_SITE_OTHER): Payer: Federal, State, Local not specified - PPO | Admitting: Family Medicine

## 2019-08-16 ENCOUNTER — Other Ambulatory Visit: Payer: Self-pay

## 2019-08-16 ENCOUNTER — Encounter: Payer: Self-pay | Admitting: Family Medicine

## 2019-08-16 VITALS — BP 139/73 | HR 91 | Ht 66.0 in | Wt 288.0 lb

## 2019-08-16 DIAGNOSIS — R350 Frequency of micturition: Secondary | ICD-10-CM

## 2019-08-16 DIAGNOSIS — R82998 Other abnormal findings in urine: Secondary | ICD-10-CM | POA: Diagnosis not present

## 2019-08-16 DIAGNOSIS — R319 Hematuria, unspecified: Secondary | ICD-10-CM

## 2019-08-16 LAB — POCT URINALYSIS DIP (CLINITEK)
Bilirubin, UA: NEGATIVE
Glucose, UA: NEGATIVE mg/dL
Ketones, POC UA: NEGATIVE mg/dL
Nitrite, UA: NEGATIVE
POC PROTEIN,UA: 30 — AB
Spec Grav, UA: >=1.03 — AB (ref 1.010–1.025)
Urobilinogen, UA: 0.2 E.U./dL
pH, UA: 5.5 (ref 5.0–8.0)

## 2019-08-16 MED ORDER — CEPHALEXIN 500 MG PO CAPS: 500.0000 mg | ORAL_CAPSULE | Freq: Two times a day (BID) | ORAL | 0 refills | Status: DC

## 2019-08-16 MED FILL — FREESTYLE LIBRE 14 DAY SENS: 28 days supply | Qty: 2 | Fill #0

## 2019-08-16 MED FILL — CEPHALEXIN 500 MG CAPSULE: 500 | 5 days supply | Qty: 10 | Fill #0

## 2019-08-16 NOTE — Progress Notes (Signed)
Acute Office Visit  Subjective:    Patient ID: Jennifer Erickson, female    DOB: [birth year], 49 y.o.   MRN: [id number]  Chief Complaint  Patient presents with  . Urinary Tract Infection    x several wks    HPI Patient is in today for urinary sxs. Started several weeks ago with dark urine.  She took some old cefdinir.  No abdominal pain. No N/V/D. Some low back pain.  She has had a little bit of right-sided low back pain just above the hip area.  She has noticed a strong odor to the urine that was not there previously and she is also been having some urgency.  Even in the urine has looked dark she says she has not noticed any blood specifically.  Past Medical History:  Diagnosis Date  . Lupus ([facility] [place] [facility])   . Personal history of amenorrhea   . Sinusitis     Past Surgical History:  Procedure Laterality Date  . DILATION AND CURETTAGE OF UTERUS    . TONSILLECTOMY      Family History  Problem Relation Age of Onset  . Cancer Mother        brain  . Hypertension Father   . Diabetes Brother     Social History   Socioeconomic History  . Marital status: Widowed    Spouse name: Not on file  . Number of children: Not on file  . Years of education: Not on file  . Highest education level: Not on file  Occupational History  . Not on file  Tobacco Use  . Smoking status: Never Smoker  . Smokeless tobacco: Never Used  Substance and Sexual Activity  . Alcohol use: No  . Drug use: No  . Sexual activity: Not Currently    Birth control/protection: Pill    Comment: 1st intercourse- 19, partners-1, widow  Other Topics Concern  . Not on file  Social History Narrative  . Not on file   Social Determinants of Health   Financial Resource Strain:   . Difficulty of Paying Living Expenses:   Food Insecurity:   . Worried About Charity fundraiser in the Last Year:   . Arboriculturist in the Last Year:   Transportation Needs:   . Film/video editor (Medical):   [name] Lack of  Transportation (Non-Medical):   Physical Activity:   . Days of Exercise per Week:   . Minutes of Exercise per Session:   Stress:   . Feeling of Stress :   Social Connections:   . Frequency of Communication with Friends and Family:   . Frequency of Social Gatherings with Friends and Family:   . Attends Religious Services:   . Active Member of Clubs or Organizations:   . Attends Archivist Meetings:   [name] Marital Status:   Intimate Partner Violence:   . Fear of Current or Ex-Partner:   . Emotionally Abused:   [name] Physically Abused:   . Sexually Abused:     Outpatient Medications Prior to Visit  Medication Sig Dispense Refill  . AMBULATORY NON FORMULARY MEDICATION Continuous positive airway pressure (CPAP) machine auto-titrate from 4-20 cm of H2O pressure, with all supplemental supplies as needed.  Send 30 day report to Dr Georgina Snell [phone] after 30 days 1 each 0  . Continuous Blood Gluc Receiver (FREESTYLE LIBRE 14 DAY READER) DEVI 1 application by Does not apply route every 14 (fourteen) days. Use with sensor system 1 Device 11  .  Continuous Blood Gluc Sensor (FREESTYLE LIBRE 14 DAY SENSOR) MISC APPLY TO UPPER DELTOID EVERY 14 DAYS. USE WITH READER TO DETERMINE BLOOD SUGARS 2 each 11  . EPINEPHrine (EPIPEN 2-PAK) 0.3 mg/0.3 mL IJ SOAJ injection INJECT 0.3 MLS (0.3 MG TOTAL) INTO THE MUSCLE ONCE. 1 Device 4  . etonogestrel (IMPLANON) 68 MG IMPL implant Inject 1 each into the skin once.    . hydroxychloroquine (PLAQUENIL) 200 MG tablet Take 200 mg by mouth 2 (two) times daily.  3  . albuterol (PROVENTIL HFA;VENTOLIN HFA) 108 (90 Base) MCG/ACT inhaler Inhale 2 puffs into the lungs every 6 (six) hours as needed for wheezing or shortness of breath. 1 Inhaler 1  . cefdinir (OMNICEF) 300 MG capsule Take 1 capsule (300 mg total) by mouth 2 (two) times daily. 14 capsule 0  . diclofenac sodium (VOLTAREN) 1 % GEL Apply 4 g topically 4 (four) times daily. To affected joint. 100 g 11  .  fluticasone (FLONASE) 50 MCG/ACT nasal spray One spray in each nostril twice a day, use left hand for right nostril, and right hand for left nostril. 48 g 3  . nitroGLYCERIN (NITRODUR - DOSED IN MG/24 HR) 0.2 mg/hr patch Cut and apply 1/4 patch to most painful area q24h. 30 patch 11   No facility-administered medications prior to visit.    Allergies  Allergen Reactions  . Aspirin Other (See Comments)    GI issues  . Augmentin [Amoxicillin-Pot Clavulanate] Diarrhea  . Ciprofloxacin     Nausea and vomiting.   [name] Hydrocodone     Cough syrup/nausea  . Ivp Dye [Iodinated Diagnostic Agents]     Review of Systems     Objective:    Physical Exam Vitals reviewed.  Constitutional:      Appearance: She is well-developed.  HENT:     Head: Normocephalic and atraumatic.  Eyes:     Conjunctiva/sclera: Conjunctivae normal.  Cardiovascular:     Rate and Rhythm: Normal rate.  Pulmonary:     Effort: Pulmonary effort is normal.  Musculoskeletal:     Comments: No CVA tenderness  Skin:    General: Skin is dry.     Coloration: Skin is not pale.  Neurological:     Mental Status: She is alert and oriented to person, place, and time.  Psychiatric:        Behavior: Behavior normal.     BP 139/73   Pulse 91   Ht 5' 6"  (1.676 m)   Wt 288 lb (130.6 kg)   SpO2 97%   BMI 46.48 kg/m  Wt Readings from Last 3 Encounters:  08/16/19 288 lb (130.6 kg)  12/10/18 283 lb (128.4 kg)  11/16/18 281 lb (127.5 kg)    Health Maintenance Due  Topic Date Due  . COVID-19 Vaccine (1) Never done  . MAMMOGRAM  08/12/2018    There are no preventive care reminders to display for this patient.   Lab Results  Component Value Date   TSH 1.31 07/06/2018   Lab Results  Component Value Date   WBC 7.3 07/06/2018   HGB 14.1 07/06/2018   HCT 40.6 07/06/2018   MCV 89.0 07/06/2018   PLT 286 07/06/2018   Lab Results  Component Value Date   NA 138 07/06/2018   K 3.8 07/06/2018   CO2 24 07/06/2018    GLUCOSE 93 07/06/2018   BUN 13 07/06/2018   CREATININE 0.83 07/06/2018   BILITOT 0.3 07/06/2018   ALKPHOS 104 05/28/2015   AST 17  07/06/2018   ALT 24 07/06/2018   PROT 7.4 07/06/2018   ALBUMIN 4.2 05/28/2015   CALCIUM 9.1 07/06/2018   Lab Results  Component Value Date   CHOL 167 05/12/2018   Lab Results  Component Value Date   HDL 51 05/12/2018   Lab Results  Component Value Date   LDLCALC 102 (H) 05/12/2018   Lab Results  Component Value Date   TRIG 62 05/12/2018   Lab Results  Component Value Date   CHOLHDL 3.3 05/12/2018   Lab Results  Component Value Date   HGBA1C 6.0 (H) 05/12/2018       Assessment & Plan:   Problem List Items Addressed This Visit    None    Visit Diagnoses    Dark urine    -  Primary   Relevant Medications   cephALEXin (KEFLEX) 500 MG capsule   Other Relevant Orders   POCT URINALYSIS DIP (CLINITEK) (Completed)   Hematuria, unspecified type       Relevant Medications   cephALEXin (KEFLEX) 500 MG capsule   Other Relevant Orders   Urinalysis w microscopic + reflex cultur   Urinary frequency       Relevant Medications   cephALEXin (KEFLEX) 500 MG capsule    Urinary urgency with odor-urinalysis positive for leukocytes blood and protein.  We will go ahead and treat for possible UTI with Keflex.  If not improving then she will give [place] call back.  Did send for culture for confirmation.  Recommend return in 2 weeks to recheck for the protein in the blood   Meds ordered this encounter  Medications  . cephALEXin (KEFLEX) 500 MG capsule    Sig: Take 1 capsule (500 mg total) by mouth 2 (two) times daily.    Dispense:  10 capsule    Refill:  0     Beatrice Lecher, MD

## 2019-08-16 NOTE — Patient Instructions (Signed)
Commend recheck urinalysis in 2 weeks to make sure that the protein in blood has resolved

## 2019-08-17 ENCOUNTER — Encounter: Payer: Self-pay | Admitting: Obstetrics & Gynecology

## 2019-08-17 ENCOUNTER — Encounter: Payer: Self-pay | Admitting: Physician Assistant

## 2019-08-17 ENCOUNTER — Ambulatory Visit (INDEPENDENT_AMBULATORY_CARE_PROVIDER_SITE_OTHER): Payer: Federal, State, Local not specified - PPO | Admitting: Obstetrics & Gynecology

## 2019-08-17 VITALS — BP 126/84 | Ht 65.0 in | Wt 287.0 lb

## 2019-08-17 DIAGNOSIS — Z1231 Encounter for screening mammogram for malignant neoplasm of breast: Secondary | ICD-10-CM | POA: Diagnosis not present

## 2019-08-17 DIAGNOSIS — Z6841 Body Mass Index (BMI) 40.0 and over, adult: Secondary | ICD-10-CM

## 2019-08-17 DIAGNOSIS — M329 Systemic lupus erythematosus, unspecified: Secondary | ICD-10-CM

## 2019-08-17 DIAGNOSIS — Z3046 Encounter for surveillance of implantable subdermal contraceptive: Secondary | ICD-10-CM

## 2019-08-17 DIAGNOSIS — Z01419 Encounter for gynecological examination (general) (routine) without abnormal findings: Secondary | ICD-10-CM | POA: Diagnosis not present

## 2019-08-17 NOTE — Patient Instructions (Signed)
1. Well female exam with routine gynecological exam Normal gynecologic exam.  Last Pap test August 2019 was negative, no indication to repeat this year.  Breast exam normal.  Screening mammogram done today at [facility], will obtain report.  Health labs with family physician.  2. Encounter for surveillance of implantable subdermal contraceptive Well on Nexplanon since September 2020.  3. Class 3 severe obesity due to excess calories with serious comorbidity and body mass index (BMI) of 45.0 to 49.9 in adult [facility]) Recommend a lower calorie/carb diet such as [employer].  Aerobic activities 5 times a week and light weightlifting every 2 days.  4. Systemic lupus erythematosus, unspecified SLE type, unspecified organ involvement status ([place]) On Plaquenil.  Followed by Rheumato.  [name], it was a pleasure seeing you today!

## 2019-08-17 NOTE — Progress Notes (Signed)
LILIT CINELLI 12/08/1970 [id number]   History:    49 y.o. G0 Widowed x 06/2017.  Has 2 dogs. PHD in Psychology.  RP:  Established patient presenting for annual gyn exam   HPI: Well on Nexplanon since September 2020.  No breakthrough bleeding.  No pelvic pain.  Currently abstinent.  Normal vaginal secretions.  Urine and bowel movements normal.  Breasts normal.  Body mass index 47.76.  Intends to exercise more.  Health labs with family physician.  Systemic lupus erythematosus on Plaquenil followed by rheumatology.   Past medical history,surgical history, family history and social history were all reviewed and documented in the EPIC chart.  Gynecologic History No LMP recorded. Patient has had an implant.  Obstetric History OB History  Gravida Para Term Preterm AB Living  0 0 0 0 0 0  SAB TAB Ectopic Multiple Live Births  0 0 0 0 0     ROS: A ROS was performed and pertinent positives and negatives are included in the history.  GENERAL: No fevers or chills. HEENT: No change in vision, no earache, sore throat or sinus congestion. NECK: No pain or stiffness. CARDIOVASCULAR: No chest pain or pressure. No palpitations. PULMONARY: No shortness of breath, cough or wheeze. GASTROINTESTINAL: No abdominal pain, nausea, vomiting or diarrhea, melena or bright red blood per rectum. GENITOURINARY: No urinary frequency, urgency, hesitancy or dysuria. MUSCULOSKELETAL: No joint or muscle pain, no back pain, no recent trauma. DERMATOLOGIC: No rash, no itching, no lesions. ENDOCRINE: No polyuria, polydipsia, no heat or cold intolerance. No recent change in weight. HEMATOLOGICAL: No anemia or easy bruising or bleeding. NEUROLOGIC: No headache, seizures, numbness, tingling or weakness. PSYCHIATRIC: No depression, no loss of interest in normal activity or change in sleep pattern.     Exam:   BP 126/84   Ht 5' 5"  (1.651 m)   Wt 287 lb (130.2 kg)   BMI 47.76 kg/m   Body mass index is 47.76  kg/m.  General appearance : Well developed well nourished female. No acute distress HEENT: Eyes: no retinal hemorrhage or exudates,  Neck supple, trachea midline, no carotid bruits, no thyroidmegaly Lungs: Clear to auscultation, no rhonchi or wheezes, or rib retractions  Heart: Regular rate and rhythm, no murmurs or gallops Breast:Examined in sitting and supine position were symmetrical in appearance, no palpable masses or tenderness,  no skin retraction, no nipple inversion, no nipple discharge, no skin discoloration, no axillary or supraclavicular lymphadenopathy Abdomen: no palpable masses or tenderness, no rebound or guarding Extremities: no edema or skin discoloration or tenderness  Pelvic: Vulva: Normal             Vagina: No gross lesions or discharge  Cervix: No gross lesions or discharge  Uterus  AV, normal size, shape and consistency, non-tender and mobile  Adnexa  Without masses or tenderness  Anus: Normal   Assessment/Plan:  49 y.o. female for annual exam   1. Well female exam with routine gynecological exam Normal gynecologic exam.  Last Pap test August 2019 was negative, no indication to repeat this year.  Breast exam normal.  Screening mammogram done today at [facility], will obtain report.  Health labs with family physician.  2. Encounter for surveillance of implantable subdermal contraceptive Well on Nexplanon since September 2020.  3. Class 3 severe obesity due to excess calories with serious comorbidity and body mass index (BMI) of 45.0 to 49.9 in adult [facility]) Recommend a lower calorie/carb diet such as [employer].  Aerobic  activities 5 times a week and light weightlifting every 2 days.  4. Systemic lupus erythematosus, unspecified SLE type, unspecified organ involvement status ([place]) On Plaquenil.  Followed by Rheumato.  Princess Bruins MD, 4:22 PM 08/17/2019

## 2019-08-18 ENCOUNTER — Telehealth (INDEPENDENT_AMBULATORY_CARE_PROVIDER_SITE_OTHER): Payer: Federal, State, Local not specified - PPO | Admitting: Sports Medicine

## 2019-08-18 DIAGNOSIS — R200 Anesthesia of skin: Secondary | ICD-10-CM | POA: Diagnosis not present

## 2019-08-18 DIAGNOSIS — R202 Paresthesia of skin: Secondary | ICD-10-CM

## 2019-08-18 LAB — URINALYSIS W MICROSCOPIC + REFLEX CULTURE
Bilirubin Urine: NEGATIVE
Glucose, UA: NEGATIVE
Hgb urine dipstick: NEGATIVE
Hyaline Cast: NONE SEEN /LPF
Ketones, ur: NEGATIVE
Nitrites, Initial: NEGATIVE
Protein, ur: NEGATIVE
Specific Gravity, Urine: 1.024 (ref 1.001–1.03)
pH: 5.5 (ref 5.0–8.0)

## 2019-08-18 LAB — URINE CULTURE
MICRO NUMBER:: 10495565
SPECIMEN QUALITY:: ADEQUATE

## 2019-08-18 LAB — CULTURE INDICATED

## 2019-08-18 MED ORDER — PREDNISONE 50 MG PO TABS: ORAL_TABLET | ORAL | 0 refills | Status: DC

## 2019-08-18 MED FILL — predniSONE 50 MG TABS: 50 | 5 days supply | Qty: 5 | Fill #0

## 2019-08-18 NOTE — Progress Notes (Signed)
   Virtual Visit via WebEx/MyChart   I connected with  Xochitl Egle Johnson-Holley  on 08/18/19 via WebEx/MyChart/Doximity Video and verified that I am speaking with the correct person using two identifiers.   I discussed the limitations, risks, security and privacy concerns of performing an evaluation and management service by WebEx/MyChart/Doximity Video, including the higher likelihood of inaccurate diagnosis and treatment, and the availability of in person appointments.  We also discussed the likely need of an additional face to face encounter for complete and high quality delivery of care.  I also discussed with the patient that there may be a patient responsible charge related to this service. The patient expressed understanding and wishes to proceed.  Provider location is either at home or medical facility. Patient location is at their home, different from provider location. People involved in care of the patient during this telehealth encounter were myself, my nurse/medical assistant, and my front office/scheduling team member.  Review of Systems: No fevers, chills, night sweats, weight loss, chest pain, or shortness of breath.   Objective Findings:    General: Speaking full sentences, no audible heavy breathing.  Sounds alert and appropriately interactive.  Appears well.  Face symmetric.  Extraocular movements intact.  Pupils equal and round.  No nasal flaring or accessory muscle use visualized.  Independent interpretation of tests performed by another provider:   None.  Brief History, Exam, Impression, and Recommendations:    Numbness and tingling in both hands This is a pleasant 49 year old female, for the past few weeks she is noted increasing widespread joint aches and pains, as well as numbness and tingling into both hands, not worse at night or in the mornings when she wakes up. No progressive weakness, she does have some significant neck pain. I am unable to do a physical exam, I  tried to show her how to do the Tinel's and Phalen signs over the virtual visit. They did not seem to reproduce any symptoms, adding 5 days of prednisone, she will get bilateral nighttime splints to wear when she is sleeping. Return to see me in about 3 weeks, we will be able to do a physical exam at that time and hopefully the symptoms will declare themselves as a radicular or carpal tunnel.   I discussed the above assessment and treatment plan with the patient. The patient was provided an opportunity to ask questions and all were answered. The patient agreed with the plan and demonstrated an understanding of the instructions.   The patient was advised to call back or seek an in-person evaluation if the symptoms worsen or if the condition fails to improve as anticipated.   I provided 30 minutes of face to face and non-face-to-face time during this encounter date, time was needed to gather information, review chart, records, communicate/coordinate with staff remotely, as well as complete documentation.   ___________________________________________ Gwen Her. Dianah Field, M.D., ABFM., CAQSM. Primary Care and [place] Instructor of [place] [facility]

## 2019-08-18 NOTE — Assessment & Plan Note (Signed)
This is a pleasant 49 year old female, for the past few weeks she is noted increasing widespread joint aches and pains, as well as numbness and tingling into both hands, not worse at night or in the mornings when she wakes up. No progressive weakness, she does have some significant neck pain. I am unable to do a physical exam, I tried to show her how to do the Tinel's and Phalen signs over the virtual visit. They did not seem to reproduce any symptoms, adding 5 days of prednisone, she will get bilateral nighttime splints to wear when she is sleeping. Return to see me in about 3 weeks, we will be able to do a physical exam at that time and hopefully the symptoms will declare themselves as a radicular or carpal tunnel.

## 2019-08-19 MED ORDER — SULFAMETHOXAZOLE-TRIMETHOPRIM 800-160 MG PO TABS: 1.0000 | ORAL_TABLET | Freq: Two times a day (BID) | ORAL | 0 refills | Status: DC

## 2019-08-19 MED FILL — SULFAMETHOXAZOLE-TMP DS TAB: 800-160 | 3 days supply | Qty: 6 | Fill #0

## 2019-08-19 NOTE — Addendum Note (Signed)
Addended by: Beatrice Lecher D on: 08/19/2019 07:47 AM   Modules accepted: Orders

## 2019-09-02 ENCOUNTER — Ambulatory Visit: Payer: Federal, State, Local not specified - PPO | Admitting: Physician Assistant

## 2019-10-21 MED FILL — HYDROXYCHLOROQUINE 200 MG T: 200 | 30 days supply | Qty: 60 | Fill #1

## 2019-10-27 MED FILL — FREESTYLE LIBRE 14 DAY SENS: 28 days supply | Qty: 2 | Fill #1

## 2019-11-08 ENCOUNTER — Ambulatory Visit: Payer: Federal, State, Local not specified - PPO | Attending: Internal Medicine

## 2019-11-08 DIAGNOSIS — Z23 Encounter for immunization: Secondary | ICD-10-CM

## 2019-11-08 NOTE — Progress Notes (Signed)
   Covid-19 Vaccination Clinic  Name:  Jennifer Erickson    MRN: [id number] DOB: 1970-11-23  11/08/2019  Jennifer Erickson was observed post Covid-19 immunization for 15 minutes without incident. She was provided with Vaccine Information Sheet and instruction to access the V-Safe system.   Jennifer Erickson was instructed to call 911 with any severe reactions post vaccine: [name] Difficulty breathing  . Swelling of face and throat  . A fast heartbeat  . A bad rash all over body  . Dizziness and weakness   Immunizations Administered    Name Date Dose VIS Date Route   Pfizer COVID-19 Vaccine 11/08/2019 10:27 AM 0.3 mL 05/25/2018 Intramuscular   Manufacturer: [place]   Lot: [id number]   [place]: [id number]

## 2019-11-13 IMAGING — DX DG SINUSES COMPLETE 3+V
3 series · 3 of 3 positions shown · non-contrast
Comparison: Sinus series of December 19, 2015

CLINICAL DATA: Three days of frontal headache.

EXAM:
PARANASAL SINUSES - COMPLETE 3 + VIEW

[pns waters]
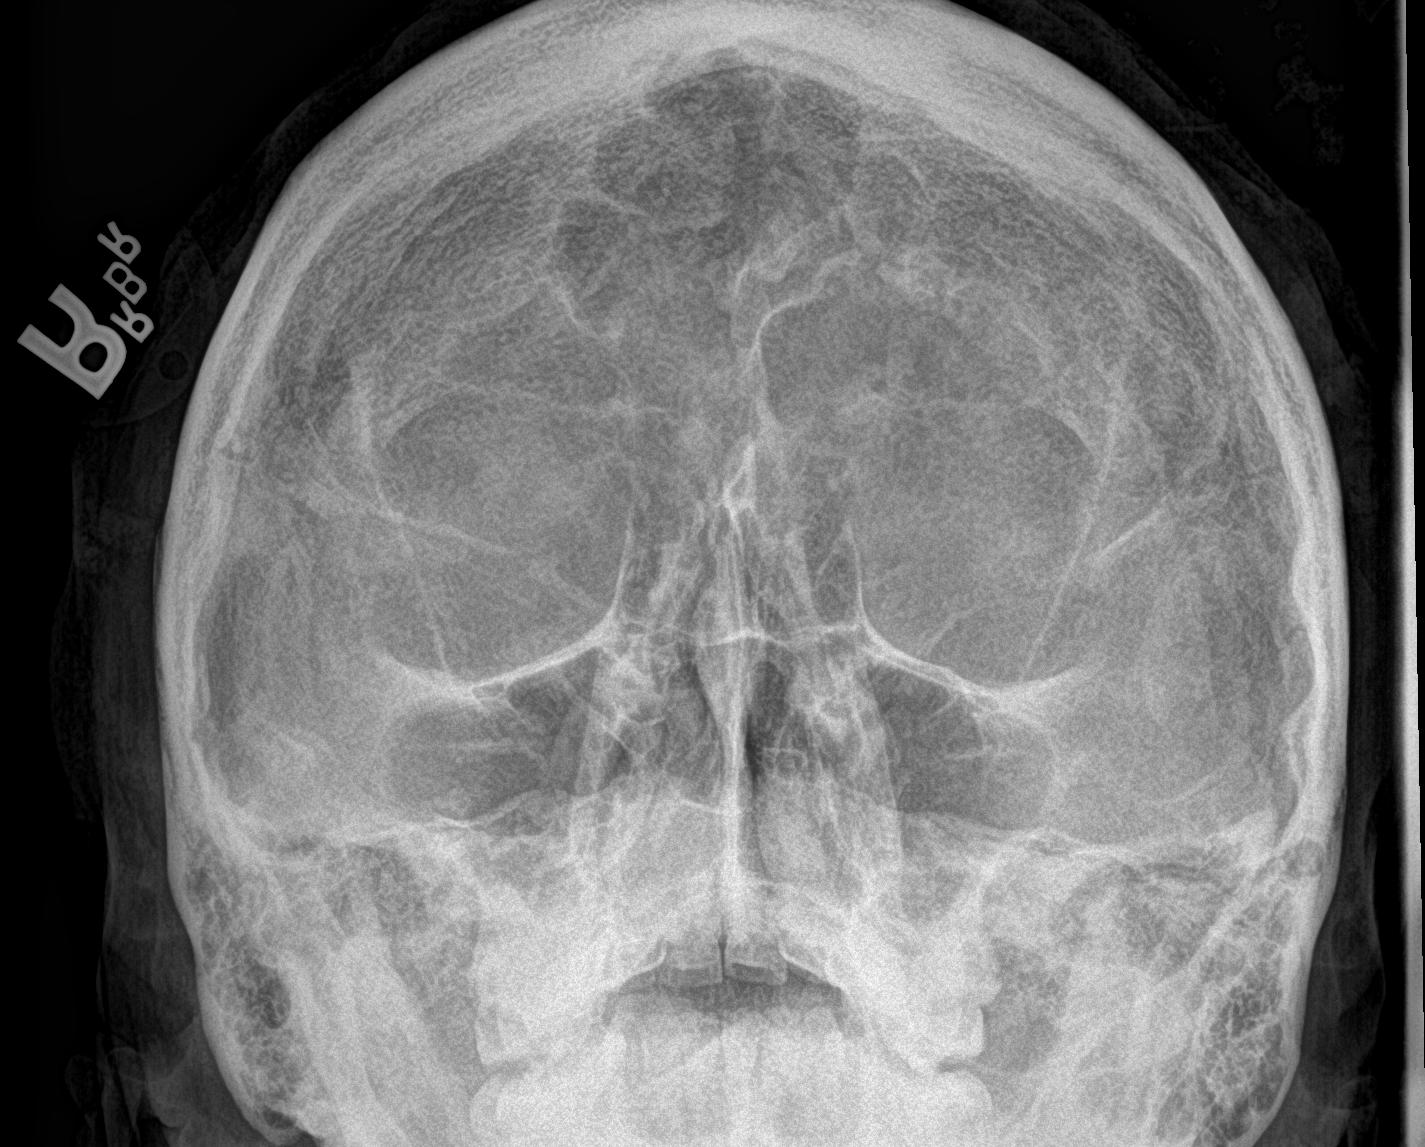

[[person_name]]
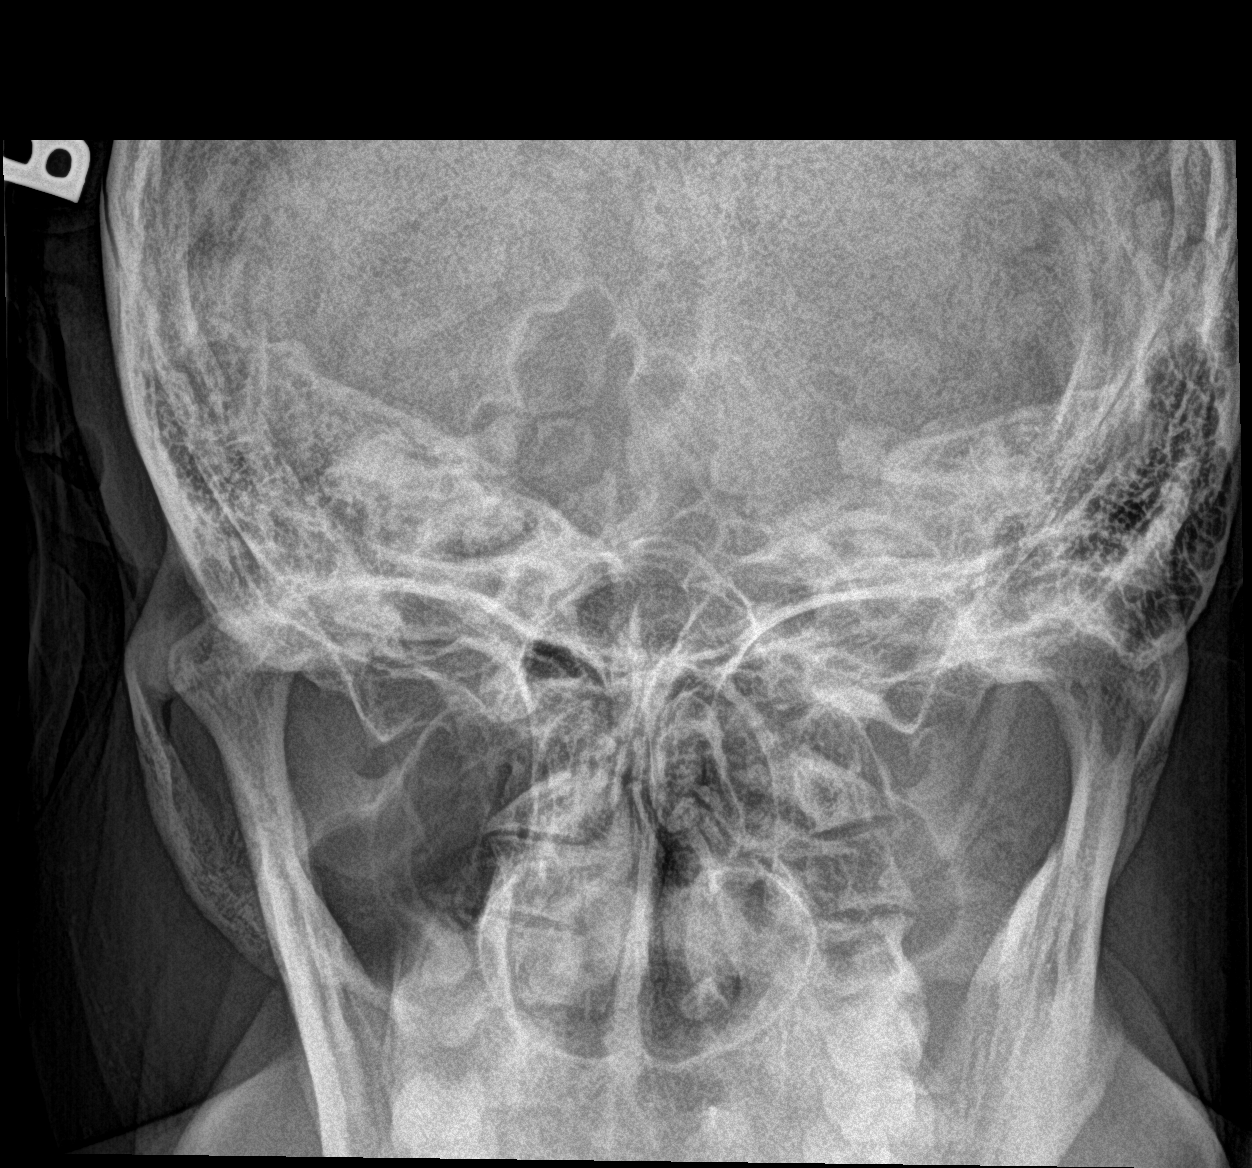

[pns lat]
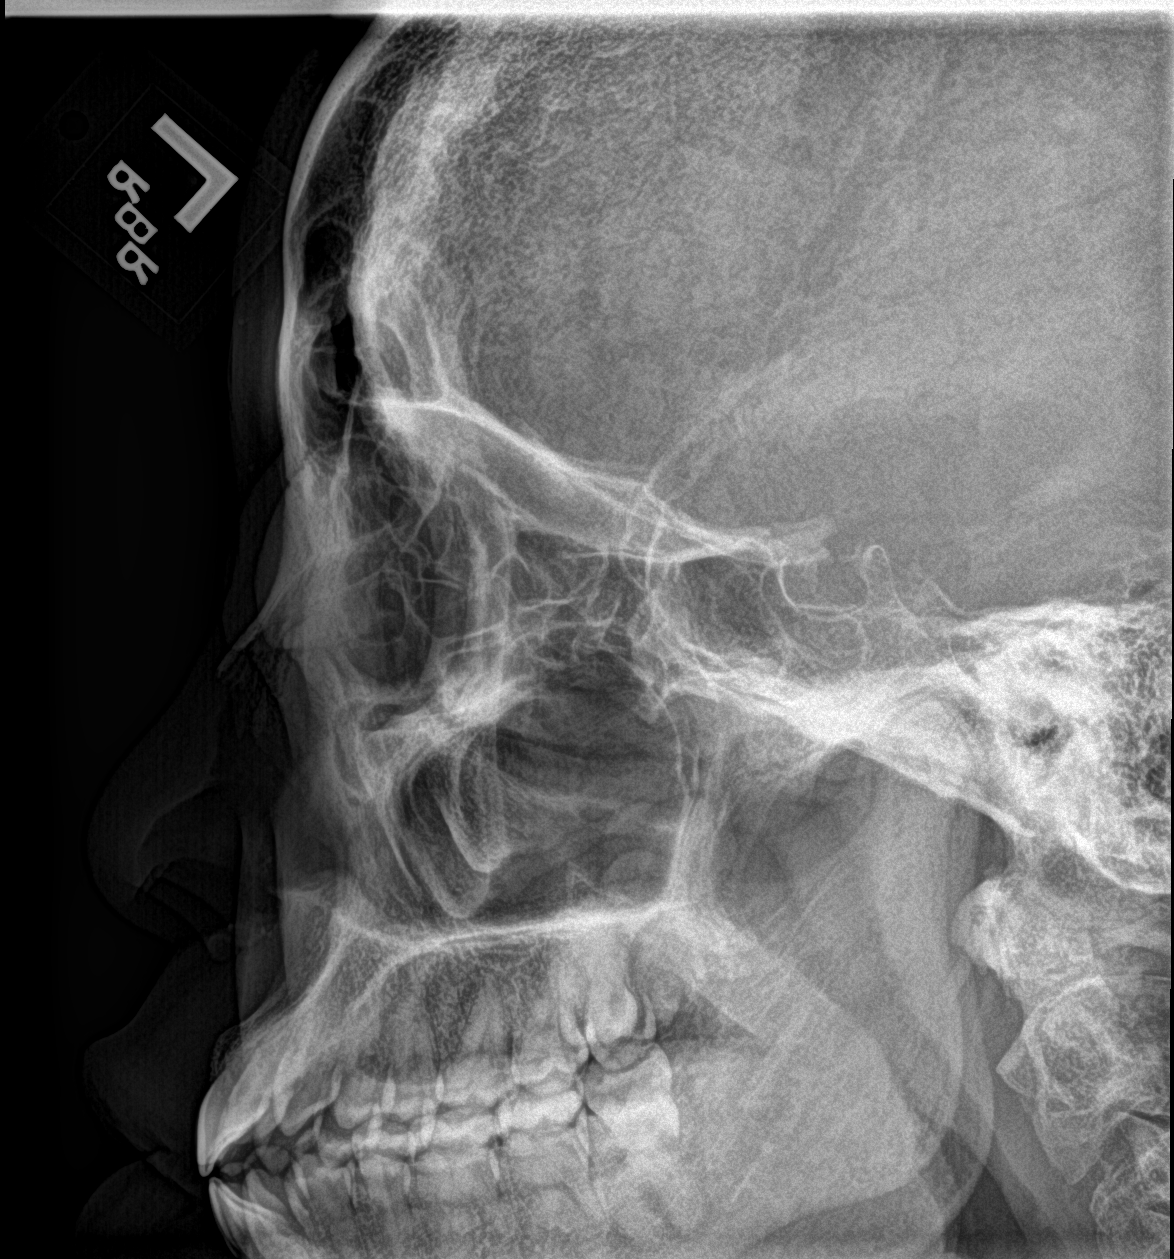

[3 of 3 positions shown; findings below may reference images not displayed]

FINDINGS: The frontal sinuses are grossly clear. The ethmoid, maxillary, and
sphenoid sinuses are clear. No air-fluid levels are observed.
IMPRESSION: No findings to suggest acute or significant chronic paranasal sinus
inflammation.

## 2019-11-18 ENCOUNTER — Telehealth (INDEPENDENT_AMBULATORY_CARE_PROVIDER_SITE_OTHER): Payer: Federal, State, Local not specified - PPO | Admitting: Physician Assistant

## 2019-11-18 ENCOUNTER — Encounter: Payer: Self-pay | Admitting: Physician Assistant

## 2019-11-18 VITALS — Temp 98.4°F

## 2019-11-18 DIAGNOSIS — Z7185 Encounter for immunization safety counseling: Secondary | ICD-10-CM

## 2019-11-18 DIAGNOSIS — R519 Headache, unspecified: Secondary | ICD-10-CM | POA: Diagnosis not present

## 2019-11-18 DIAGNOSIS — Z7189 Other specified counseling: Secondary | ICD-10-CM | POA: Diagnosis not present

## 2019-11-18 MED ORDER — METHYLPREDNISOLONE 4 MG PO TBPK: ORAL_TABLET | ORAL | 0 refills | Status: DC

## 2019-11-18 MED FILL — METHYLPREDNISOLONE 4 MG TBP: 4 | 6 days supply | Qty: 21 | Fill #0

## 2019-11-18 NOTE — Progress Notes (Signed)
Patient ID: Jennifer Erickson, female   DOB: [birth year], 49 y.o.   MRN: [id number] .Marland KitchenVirtual Visit via Telephone Note  I connected with Jennifer Erickson on 11/18/2019 at  1:40 PM EDT by telephone and verified that I am speaking with the correct person using two identifiers.  Location: Patient: home Provider: clinic   I discussed the limitations, risks, security and privacy concerns of performing an evaluation and management service by telephone and the availability of in person appointments. I also discussed with the patient that there may be a patient responsible charge related to this service. The patient expressed understanding and agreed to proceed.   History of Present Illness: Pt is a 49 yo obese female who presents to the clinic after headache for the last 10 days after receiving the covid vaccine. She initially felt a little achy but that has resolved. The headache is whole head and pressure like. She is a little nauseated when headache is bad. She denies any light or sound sensitivity, facial asymettry, upper ext weakness, confusion. She has no sick symptoms. No fever, chills, SOB, cough. She is taking tylenol which dulls the headache some.   .. Active Ambulatory Problems    Diagnosis Date Noted  . Obesity, Class III, BMI 40-49.9 (morbid obesity) ([place]) 08/19/2011  . Insomnia 12/30/2011  . Plantar fasciitis, bilateral 11/01/2012  . Left breast mass 11/22/2012  . Obstructive sleep apnea 11/22/2012  . Bilateral swelling of feet 05/02/2013  . Vitamin D deficiency 04/19/2014  . Primary osteoarthritis of both knees 12/08/2014  . Large breasts 05/28/2015  . Left tennis elbow 05/28/2015  . OSA on CPAP 07/10/2015  . Palpitations 07/11/2015  . 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 Elevated blood pressure reading 09/24/2017  . 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 Encounter for long-term  (current) use of high-risk medication 02/17/2018  . Effect of high altitude on sinuses 07/23/2018  . Metabolic syndrome with hyperglycemia 07/23/2018  . Pain of left heel 10/19/2018  . Lumbar degenerative disc disease 06/06/2019  . Numbness and tingling in both hands 08/18/2019   Resolved Ambulatory Problems    Diagnosis Date Noted  . Headache(784.0) 08/19/2011  . Uvular swelling 08/19/2011  . Uvular swelling 12/30/2011  . Dermatitis 05/26/2012  . Left lumbar radiculitis 01/25/2014  . Flank pain 11/01/2014  . Right-sided low back pain without sciatica 05/07/2015  . Cramps of left lower extremity 11/28/2015  . Polyarthralgia 02/18/2016  . Toe fracture, right 04/15/2016  . Lateral epicondylitis of both elbows 12/03/2016  . Bilateral shoulder pain 12/03/2016  . Grief reaction 12/26/2016  . Coughing 07/03/2017  . Acute frontal sinusitis 06/18/2018   Past Medical History:  Diagnosis Date  . Lupus ([place])   . Personal history of amenorrhea   . Sinusitis    Reviewed meds, allergy, problem list.    Observations/Objective: No acute distress Normal breathing.  Normal mood and appearance.   .. Today's Vitals   11/18/19 1340  Temp: 98.4 F (36.9 C)  TempSrc: Oral   There is no height or weight on file to calculate BMI.    Assessment and Plan:  Marland KitchenMarland KitchenDiagnoses and all orders for this visit:  Intractable headache, unspecified chronicity pattern, unspecified headache type -     methylPREDNISolone (MEDROL DOSEPAK) 4 MG TBPK tablet; Take as directed by package insert.  Vaccine counseling   HA started after vaccine and has not resolved since. Recommended to also be covid tested.  She will have time periods where she gets some relief. No other illness symptoms. Medrol dose pack given to start 2 weeks after vaccine administration date. I will write letter to extend 2nd shot to at least 2 weeks after completion of steroid pack.     Follow Up Instructions:    I discussed the  assessment and treatment plan with the patient. The patient was provided an opportunity to ask questions and all were answered. The patient agreed with the plan and demonstrated an understanding of the instructions.   The patient was advised to call back or seek an in-person evaluation if the symptoms worsen or if the condition fails to improve as anticipated.  I provided 10 minutes of non-face-to-face time during this encounter.   Iran Planas, PA-C

## 2019-11-19 DIAGNOSIS — R509 Fever, unspecified: Secondary | ICD-10-CM | POA: Diagnosis not present

## 2019-11-20 ENCOUNTER — Encounter: Payer: Self-pay | Admitting: Physician Assistant

## 2019-11-29 ENCOUNTER — Ambulatory Visit: Payer: Federal, State, Local not specified - PPO | Attending: Internal Medicine

## 2019-11-29 DIAGNOSIS — Z23 Encounter for immunization: Secondary | ICD-10-CM

## 2019-11-29 NOTE — Progress Notes (Signed)
   Covid-19 Vaccination Clinic  Name:  Jennifer Erickson    MRN: [id number] DOB: 06-25-1970  11/29/2019  Jennifer Erickson was observed post Covid-19 immunization for 15 minutes without incident. She was provided with Vaccine Information Sheet and instruction to access the V-Safe system.   Jennifer Erickson was instructed to call 911 with any severe reactions post vaccine: [name] Difficulty breathing  . Swelling of face and throat  . A fast heartbeat  . A bad rash all over body  . Dizziness and weakness   Immunizations Administered    Name Date Dose VIS Date Route   Pfizer COVID-19 Vaccine 11/29/2019 11:46 AM 0.3 mL 05/25/2018 Intramuscular   Manufacturer: [place]   Lot: [id number]   [place]: [address]-4

## 2019-12-03 ENCOUNTER — Encounter: Payer: Self-pay | Admitting: Physician Assistant

## 2019-12-06 NOTE — Telephone Encounter (Signed)
[place] for note for post vaccine symptoms.

## 2019-12-12 ENCOUNTER — Encounter: Payer: Self-pay | Admitting: Physician Assistant

## 2019-12-14 ENCOUNTER — Other Ambulatory Visit: Payer: Federal, State, Local not specified - PPO

## 2019-12-14 ENCOUNTER — Other Ambulatory Visit: Payer: Self-pay

## 2019-12-14 DIAGNOSIS — Z20822 Contact with and (suspected) exposure to covid-19: Secondary | ICD-10-CM

## 2019-12-16 DIAGNOSIS — Z9189 Other specified personal risk factors, not elsewhere classified: Secondary | ICD-10-CM | POA: Diagnosis not present

## 2019-12-16 DIAGNOSIS — J302 Other seasonal allergic rhinitis: Secondary | ICD-10-CM | POA: Diagnosis not present

## 2019-12-16 LAB — NOVEL CORONAVIRUS, NAA: SARS-CoV-2, NAA: NOT DETECTED

## 2019-12-16 LAB — SARS-COV-2, NAA 2 DAY TAT

## 2019-12-16 MED FILL — predniSONE 5 MG TABS: 5 | 6 days supply | Qty: 21 | Fill #0

## 2019-12-16 MED FILL — MONTELUKAST SOD 10 MG TAB: 10 | 30 days supply | Qty: 30 | Fill #0

## 2019-12-16 MED FILL — HYDROXYCHLOROQUINE 200 MG T: 200 | 30 days supply | Qty: 60 | Fill #2

## 2019-12-16 MED FILL — FREESTYLE LIBRE 14 DAY SENS: 28 days supply | Qty: 2 | Fill #2

## 2020-01-04 ENCOUNTER — Encounter: Payer: Self-pay | Admitting: Physician Assistant

## 2020-01-04 ENCOUNTER — Telehealth (INDEPENDENT_AMBULATORY_CARE_PROVIDER_SITE_OTHER): Payer: Federal, State, Local not specified - PPO | Admitting: Sports Medicine

## 2020-01-04 DIAGNOSIS — Z0000 Encounter for general adult medical examination without abnormal findings: Secondary | ICD-10-CM | POA: Diagnosis not present

## 2020-01-04 DIAGNOSIS — M7061 Trochanteric bursitis, right hip: Secondary | ICD-10-CM | POA: Diagnosis not present

## 2020-01-04 NOTE — Assessment & Plan Note (Signed)
Due for colon cancer screening, adding Cologuard.

## 2020-01-04 NOTE — Progress Notes (Signed)
   Virtual Visit via WebEx/MyChart   I connected with  Jennifer Erickson  on 01/04/20 via WebEx/MyChart/Doximity Video and verified that I am speaking with the correct person using two identifiers.   I discussed the limitations, risks, security and privacy concerns of performing an evaluation and management service by WebEx/MyChart/Doximity Video, including the higher likelihood of inaccurate diagnosis and treatment, and the availability of in person appointments.  We also discussed the likely need of an additional face to face encounter for complete and high quality delivery of care.  I also discussed with the patient that there may be a patient responsible charge related to this service. The patient expressed understanding and wishes to proceed.  Provider location is in medical facility. Patient location is at their home, different from provider location. People involved in care of the patient during this telehealth encounter were myself, my nurse/medical assistant, and my front office/scheduling team member.  Review of Systems: No fevers, chills, night sweats, weight loss, chest pain, or shortness of breath.   Objective Findings:    General: Speaking full sentences, no audible heavy breathing.  Sounds alert and appropriately interactive.  Appears well.  Face symmetric.  Extraocular movements intact.  Pupils equal and round.  No nasal flaring or accessory muscle use visualized.  Independent interpretation of tests performed by another provider:   None.  Brief History, Exam, Impression, and Recommendations:    Trochanteric bursitis, right hip Jennifer Erickson is a pleasant 49 year old female, lately she has had pain in her hip, right-sided and lateral, difficult to lay on the ipsilateral side, and painful when she presses over her greater trochanter. We will start conservative, I am going to send her a PDF of the rehab exercises, if no improvement after a month we will consider  injection.  Annual physical exam Due for colon cancer screening, adding Cologuard.   I discussed the above assessment and treatment plan with the patient. The patient was provided an opportunity to ask questions and all were answered. The patient agreed with the plan and demonstrated an understanding of the instructions.   The patient was advised to call back or seek an in-person evaluation if the symptoms worsen or if the condition fails to improve as anticipated.   I provided 30 minutes of face to face and non-face-to-face time during this encounter date, time was needed to gather information, review chart, records, communicate/coordinate with staff remotely, as well as complete documentation.   ___________________________________________ Gwen Her. Dianah Field, M.D., ABFM., CAQSM. Primary Care and [place] Instructor of [place] [facility]

## 2020-01-04 NOTE — Assessment & Plan Note (Signed)
Jennifer Erickson is a pleasant 49 year old female, lately she has had pain in her hip, right-sided and lateral, difficult to lay on the ipsilateral side, and painful when she presses over her greater trochanter. We will start conservative, I am going to send her a PDF of the rehab exercises, if no improvement after a month we will consider injection.

## 2020-01-05 ENCOUNTER — Other Ambulatory Visit: Payer: Self-pay

## 2020-01-05 ENCOUNTER — Other Ambulatory Visit (HOSPITAL_BASED_OUTPATIENT_CLINIC_OR_DEPARTMENT_OTHER): Payer: Self-pay | Admitting: Physician Assistant

## 2020-01-05 DIAGNOSIS — K1379 Other lesions of oral mucosa: Secondary | ICD-10-CM

## 2020-01-05 MED ORDER — EPINEPHRINE 0.3 MG/0.3ML IJ SOAJ: INTRAMUSCULAR | 1 refills | Status: DC

## 2020-01-05 MED FILL — EPINEPHRINE 0.3 MG AUTO-INJ: 0.3 | 2 days supply | Qty: 2 | Fill #0

## 2020-01-10 ENCOUNTER — Ambulatory Visit: Payer: Federal, State, Local not specified - PPO

## 2020-01-13 ENCOUNTER — Ambulatory Visit: Payer: Federal, State, Local not specified - PPO

## 2020-01-14 ENCOUNTER — Other Ambulatory Visit: Payer: Self-pay

## 2020-01-14 ENCOUNTER — Emergency Department (INDEPENDENT_AMBULATORY_CARE_PROVIDER_SITE_OTHER)
Admission: EM | Admit: 2020-01-14 | Discharge: 2020-01-14 | Disposition: A | Discharge status: Home / Self Care | Payer: Federal, State, Local not specified - PPO | Source: Home / Self Care

## 2020-01-14 ENCOUNTER — Encounter: Payer: Self-pay | Admitting: Emergency Medicine

## 2020-01-14 DIAGNOSIS — J069 Acute upper respiratory infection, unspecified: Secondary | ICD-10-CM

## 2020-01-14 DIAGNOSIS — J0110 Acute frontal sinusitis, unspecified: Secondary | ICD-10-CM | POA: Diagnosis not present

## 2020-01-14 DIAGNOSIS — Z760 Encounter for issue of repeat prescription: Secondary | ICD-10-CM

## 2020-01-14 DIAGNOSIS — K1379 Other lesions of oral mucosa: Secondary | ICD-10-CM

## 2020-01-14 MED ORDER — CEFDINIR 300 MG PO CAPS: 300.0000 mg | ORAL_CAPSULE | Freq: Two times a day (BID) | ORAL | 0 refills | Status: DC

## 2020-01-14 MED ORDER — EPINEPHRINE 0.3 MG/0.3ML IJ SOAJ: INTRAMUSCULAR | 1 refills | Status: DC

## 2020-01-14 NOTE — ED Triage Notes (Signed)
Patient c/o possible sinus infection, headache, bilateral ear pain, sneezing, no sore throat, afebrile.  Patient has tried thera-flu, tylenol just finished a taper dose of steroids.

## 2020-01-14 NOTE — ED Provider Notes (Signed)
[facility]    CSN: [id number] Arrival date & time: 01/14/20  1136      History   Chief Complaint Chief Complaint  Patient presents with  . Recurrent Sinusitis    HPI Jennifer Erickson is a 49 y.o. female.   HPI  Jennifer Erickson is a 49 y.o. female presenting to UC with c/o 1 week of sinus congestion, sinus headache, bilateral ear pain and sneezing.  She has tried thera-flu and tylenol, just finished a taper dose of steroids prescribed from a prior sinus infection that she did not take at that time. Denies fever, chills, n/v/d. She is fully vaccinated for COVID. No known sick contacts.   Past Medical History:  Diagnosis Date  . Lupus ([place])   . Personal history of amenorrhea   . Sinusitis     Patient Active Problem List   Diagnosis Date Noted  . Trochanteric bursitis, right hip 01/04/2020  . Numbness and tingling in both hands 08/18/2019  . Lumbar degenerative disc disease 06/06/2019  . Pain of left heel 10/19/2018  . Effect of high altitude on sinuses 07/23/2018  . Metabolic syndrome with hyperglycemia 07/23/2018  . 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 Annual physical exam 02/17/2018  . 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 Elevated blood pressure reading 09/24/2017  . 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 Palpitations 07/11/2015  . OSA on CPAP 07/10/2015  . Large breasts 05/28/2015  . Left tennis elbow 05/28/2015  . Primary osteoarthritis of both knees 12/08/2014  . Vitamin D deficiency 04/19/2014  . Bilateral swelling of feet 05/02/2013  . Left breast mass 11/22/2012  . Obstructive sleep apnea 11/22/2012  . Plantar fasciitis, bilateral 11/01/2012  . Insomnia 12/30/2011  . Obesity, Class III, BMI 40-49.9 (morbid obesity) ([place]) 08/19/2011    Past Surgical History:  Procedure Laterality Date  . DILATION AND CURETTAGE OF UTERUS    . TONSILLECTOMY      OB History    Gravida  0   Para    0   Term  0   Preterm  0   AB  0   Living  0     SAB  0   TAB  0   Ectopic  0   Multiple  0   Live Births  0            Home Medications    Prior to Admission medications   Medication Sig Start Date End Date Taking? Authorizing Provider  AMBULATORY NON FORMULARY MEDICATION Continuous positive airway pressure (CPAP) machine auto-titrate from 4-20 cm of H2O pressure, with all supplemental supplies as needed.  Send 30 day report to Dr Georgina Snell [phone] after 30 days 03/11/18  Yes Gregor Hams, MD  Continuous Blood Gluc Receiver (FREESTYLE LIBRE 14 DAY READER) DEVI 1 application by Does not apply route every 14 (fourteen) days. Use with sensor system 07/23/18  Yes Silverio Decamp, MD  Continuous Blood Gluc Sensor (FREESTYLE LIBRE 14 DAY SENSOR) MISC APPLY TO UPPER DELTOID EVERY 14 DAYS. USE WITH READER TO DETERMINE BLOOD SUGARS 08/04/19  Yes Silverio Decamp, MD  etonogestrel (IMPLANON) 68 MG IMPL implant Inject 1 each into the skin once.   Yes [provider]  hydroxychloroquine (PLAQUENIL) 200 MG tablet Take 200 mg by mouth 2 (two) times daily. 12/03/17  Yes [provider]  cefdinir (OMNICEF) 300 MG capsule Take 1 capsule (300 mg total) by mouth 2 (two) times daily  for 10 days. 01/14/20 01/24/20  Noe Gens, PA-C  EPINEPHrine (EPIPEN 2-PAK) 0.3 mg/0.3 mL IJ SOAJ injection INJECT 0.3 MLS (0.3 MG TOTAL) INTO THE MUSCLE ONCE. 01/14/20   Merideth Bosque, Bronwen Betters, PA-C  methylPREDNISolone (MEDROL DOSEPAK) 4 MG TBPK tablet Take as directed by package insert. 11/18/19   Donella Stade, PA-C    Family History Family History  Problem Relation Age of Onset  . Cancer Mother        brain  . Hypertension Father   . Diabetes Brother     Social History Social History   Tobacco Use  . Smoking status: Never Smoker  . Smokeless tobacco: Never Used  Vaping Use  . Vaping Use: Never used  Substance Use Topics  . Alcohol use: No  . Drug use: No      Allergies   Aspirin, Augmentin [amoxicillin-pot clavulanate], Ciprofloxacin, Hydrocodone, and Ivp dye [iodinated diagnostic agents]   Review of Systems Review of Systems  Constitutional: Negative for chills and fever.  HENT: Positive for congestion, postnasal drip, sinus pressure, sinus pain and sneezing. Negative for ear pain, sore throat, trouble swallowing and voice change.   Respiratory: Positive for cough (mild). Negative for shortness of breath.   Cardiovascular: Negative for chest pain and palpitations.  Gastrointestinal: Negative for abdominal pain, diarrhea, nausea and vomiting.  Musculoskeletal: Negative for arthralgias, back pain and myalgias.  Skin: Negative for rash.  Neurological: Positive for headaches. Negative for dizziness and light-headedness.  All other systems reviewed and are negative.    Physical Exam Triage Vital Signs ED Triage Vitals [01/14/20 1237]  Enc Vitals Group     BP (!) 139/92     Pulse Rate 93     Resp      Temp 99.4 F (37.4 C)     Temp Source Oral     SpO2 98 %     Weight      Height      Head Circumference      Peak Flow      Pain Score 0     Pain Loc      Pain Edu?      Excl. in [place]?    No data found.  Updated Vital Signs BP (!) 139/92 (BP Location: Right Arm)   Pulse 93   Temp 99.4 F (37.4 C) (Oral)   SpO2 98%   Visual Acuity Right Eye Distance:   Left Eye Distance:   Bilateral Distance:    Right Eye Near:   Left Eye Near:    Bilateral Near:     Physical Exam Vitals and nursing note reviewed.  Constitutional:      General: She is not in acute distress.    Appearance: Normal appearance. She is well-developed. She is not ill-appearing, toxic-appearing or diaphoretic.  HENT:     Head: Normocephalic and atraumatic.     Right Ear: Tympanic membrane and ear canal normal.     Left Ear: Tympanic membrane and ear canal normal.     Nose:     Right Sinus: Maxillary sinus tenderness and frontal sinus tenderness  present.     Left Sinus: Maxillary sinus tenderness and frontal sinus tenderness present.     Mouth/Throat:     Lips: Pink.     Mouth: Mucous membranes are moist.     Pharynx: Oropharynx is clear. Uvula midline.  Cardiovascular:     Rate and Rhythm: Normal rate and regular rhythm.  Pulmonary:     Effort:  Pulmonary effort is normal. No respiratory distress.     Breath sounds: Normal breath sounds. No stridor. No wheezing, rhonchi or rales.  Musculoskeletal:        General: Normal range of motion.     Cervical back: Normal range of motion and neck supple. No tenderness.  Lymphadenopathy:     Cervical: No cervical adenopathy.  Skin:    General: Skin is warm and dry.  Neurological:     Mental Status: She is alert and oriented to person, place, and time.  Psychiatric:        Behavior: Behavior normal.      UC Treatments / Results  Labs (all labs ordered are listed, but only abnormal results are displayed) Labs Reviewed - No data to display  EKG   Radiology No results found.  Procedures Procedures (including critical care time)  Medications Ordered in UC Medications - No data to display  Initial Impression / Assessment and Plan / UC Course  I have reviewed the triage vital signs and the nursing notes.  Pertinent labs & imaging results that were available during my care of the patient were reviewed by me and considered in my medical decision making (see chart for details).     Hx and exam c/w sinusitis Rx: cefdinir, has done well with this in the past F/u with PCP as needed  Pt also requested her Epipen be sent to [facility], her PCP sent to [facility], but that pharmacy is closed on the weekends.   Final Clinical Impressions(s) / UC Diagnoses   Final diagnoses:  Acute non-recurrent frontal sinusitis  Acute upper respiratory infection  Medication refill     Discharge Instructions      Please take antibiotics as prescribed and be sure to complete entire course  even if you start to feel better to ensure infection does not come back.  Follow up with family medicine later this week if not improving.    ED Prescriptions    Medication Sig Dispense Auth. Provider   cefdinir (OMNICEF) 300 MG capsule Take 1 capsule (300 mg total) by mouth 2 (two) times daily for 10 days. 20 capsule Gerarda Fraction, Frances Ambrosino O, PA-C   EPINEPHrine (EPIPEN 2-PAK) 0.3 mg/0.3 mL IJ SOAJ injection INJECT 0.3 MLS (0.3 MG TOTAL) INTO THE MUSCLE ONCE. 2 each Noe Gens, PA-C     PDMP not reviewed this encounter.   Noe Gens, [place] 01/15/20 [phone]

## 2020-01-14 NOTE — Discharge Instructions (Signed)
°  Please take antibiotics as prescribed and be sure to complete entire course even if you start to feel better to ensure infection does not come back.  Follow up with family medicine later this week if not improving.

## 2020-01-16 ENCOUNTER — Ambulatory Visit: Payer: Self-pay

## 2020-01-17 ENCOUNTER — Other Ambulatory Visit: Payer: Self-pay | Admitting: Sports Medicine

## 2020-01-17 DIAGNOSIS — E8881 Metabolic syndrome: Secondary | ICD-10-CM

## 2020-01-17 MED FILL — EPINEPHRINE 0.3 MG AUTO-INJ: 0.3 | 2 days supply | Qty: 2 | Fill #0

## 2020-01-18 ENCOUNTER — Other Ambulatory Visit (HOSPITAL_BASED_OUTPATIENT_CLINIC_OR_DEPARTMENT_OTHER): Payer: Self-pay | Admitting: Rheumatology

## 2020-01-18 DIAGNOSIS — L930 Discoid lupus erythematosus: Secondary | ICD-10-CM | POA: Diagnosis not present

## 2020-01-18 MED FILL — HYDROXYCHLOROQUINE 200 MG T: 200 | 90 days supply | Qty: 180 | Fill #0

## 2020-01-24 ENCOUNTER — Encounter: Payer: Self-pay | Admitting: Physician Assistant

## 2020-01-24 ENCOUNTER — Telehealth (INDEPENDENT_AMBULATORY_CARE_PROVIDER_SITE_OTHER): Payer: Federal, State, Local not specified - PPO | Admitting: Physician Assistant

## 2020-01-24 VITALS — BP 119/79 | Ht 65.0 in | Wt 280.0 lb

## 2020-01-24 DIAGNOSIS — R0982 Postnasal drip: Secondary | ICD-10-CM | POA: Diagnosis not present

## 2020-01-24 DIAGNOSIS — K1379 Other lesions of oral mucosa: Secondary | ICD-10-CM

## 2020-01-24 DIAGNOSIS — Z9989 Dependence on other enabling machines and devices: Secondary | ICD-10-CM

## 2020-01-24 DIAGNOSIS — T783XXD Angioneurotic edema, subsequent encounter: Secondary | ICD-10-CM

## 2020-01-24 DIAGNOSIS — G4733 Obstructive sleep apnea (adult) (pediatric): Secondary | ICD-10-CM | POA: Diagnosis not present

## 2020-01-24 NOTE — Progress Notes (Signed)
Patient ID: Jennifer Erickson, female   DOB: April 16, 1970, 49 y.o.   MRN: [id number] .Marland KitchenVirtual Visit via Video Note  I connected with Jennifer Erickson on 01/24/2020 at  1:20 PM EDT by a video enabled telemedicine application and verified that I am speaking with the correct person using two identifiers.  Location: Patient: car Provider: clinic   I discussed the limitations of evaluation and management by telemedicine and the availability of in person appointments. The patient expressed understanding and agreed to proceed.  History of Present Illness: Patient is a 49 year old obese female with OSA who calls into the clinic to discuss concerns.  Her CPAP was recalled and she is on the [facility] waiting list for a new machine.  She was at 13 cm of pressure.  She continues to feel like her throat swells and makes it hard to breathe or swallow.  She has had this happen in the past.  She has had some significant uvular swelling.  Today she feels like it is much better.  It does get better and worse.  She denies any fever, chills, headaches, vision changes.  She has a dry cough.  She is not taking anything over-the-counter currently. She almost always just has some post nasal drip and sinus drainage. She is not on allergy medication.    .. Active Ambulatory Problems    Diagnosis Date Noted  . Obesity, Class III, BMI 40-49.9 (morbid obesity) ([place]) 08/19/2011  . Uvular swelling 08/19/2011  . Insomnia 12/30/2011  . Plantar fasciitis, bilateral 11/01/2012  . Left breast mass 11/22/2012  . Obstructive sleep apnea 11/22/2012  . Bilateral swelling of feet 05/02/2013  . Vitamin D deficiency 04/19/2014  . Primary osteoarthritis of both knees 12/08/2014  . Large breasts 05/28/2015  . Left tennis elbow 05/28/2015  . OSA on CPAP 07/10/2015  . Palpitations 07/11/2015  . 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 Elevated blood pressure reading 09/24/2017  . 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 Discoid lupus  erythematosus 11/17/2017  . 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 Effect of high altitude on sinuses 07/23/2018  . Metabolic syndrome with hyperglycemia 07/23/2018  . Pain of left heel 10/19/2018  . Lumbar degenerative disc disease 06/06/2019  . Numbness and tingling in both hands 08/18/2019  . Trochanteric bursitis, right hip 01/04/2020  . PND (post-nasal drip) 01/24/2020   Resolved Ambulatory Problems    Diagnosis Date Noted  . Headache(784.0) 08/19/2011  . Uvular swelling 12/30/2011  . Dermatitis 05/26/2012  . Left lumbar radiculitis 01/25/2014  . Flank pain 11/01/2014  . Right-sided low back pain without sciatica 05/07/2015  . Cramps of left lower extremity 11/28/2015  . Polyarthralgia 02/18/2016  . Toe fracture, right 04/15/2016  . Lateral epicondylitis of both elbows 12/03/2016  . Bilateral shoulder pain 12/03/2016  . Grief reaction 12/26/2016  . Coughing 07/03/2017  . Acute frontal sinusitis 06/18/2018  . Annual physical exam 02/17/2018   Past Medical History:  Diagnosis Date  . Lupus ([place])   . Personal history of amenorrhea   . Sinusitis    Reviewed med, allergy, problem list.    Observations/Objective: No acute distress Normal mood and appearance No labored breathing  .[name] Today's Vitals   01/24/20 1313  BP: 119/79  Weight: 280 lb (127 kg)  Height: 5' 5"  (1.651 m)   Body mass index is 46.59 kg/m.    Assessment and Plan: Marland KitchenMarland KitchenRaigen was seen today for sleep apnea.  Diagnoses and all orders for this visit:  OSA on CPAP -  Ambulatory referral to ENT  Obesity, Class III, BMI 40-49.9 (morbid obesity) ([place]) -     Ambulatory referral to ENT  PND (post-nasal drip) -     Ambulatory referral to ENT  Uvular swelling -     Ambulatory referral to ENT   Explained the importance of CPAP.  She really needs to get this back on board as soon as possible.  I would reach out to her supplier again and make sure she is done everything she can do  to be on that list to get a new machine or if we need to write an order for a new one.  I do think referral to ENT could be prudent at this point.  If she is having uvular swelling it could be prudent to remove the uvula and could even help with her sleep apnea.  She does seem to have some persistent sinus drainage.  Consider Flonase and antihistamine such as Allegra-D or Zyrtec-D daily.   Follow Up Instructions:    I discussed the assessment and treatment plan with the patient. The patient was provided an opportunity to ask questions and all were answered. The patient agreed with the plan and demonstrated an understanding of the instructions.   The patient was advised to call back or seek an in-person evaluation if the symptoms worsen or if the condition fails to improve as anticipated.     Iran Planas, PA-C

## 2020-01-24 NOTE — Progress Notes (Signed)
CPAP - recalled Already applied for a new machine, waiting to hear about it  She states feels like throat swells and cuts of air supply, states has discussed this in the past  No other issues going on   She is going to try to get on mychart, but may need to be called

## 2020-01-28 ENCOUNTER — Other Ambulatory Visit: Payer: Self-pay

## 2020-01-28 ENCOUNTER — Emergency Department (INDEPENDENT_AMBULATORY_CARE_PROVIDER_SITE_OTHER): Payer: Federal, State, Local not specified - PPO

## 2020-01-28 ENCOUNTER — Emergency Department
Admission: RE | Admit: 2020-01-28 | Discharge: 2020-01-28 | Disposition: A | Discharge status: Home / Self Care | Payer: Federal, State, Local not specified - PPO | Source: Ambulatory Visit

## 2020-01-28 VITALS — BP 124/88 | HR 91 | Temp 98.6°F | Resp 18 | Ht 65.0 in | Wt 280.0 lb

## 2020-01-28 DIAGNOSIS — R059 Cough, unspecified: Secondary | ICD-10-CM

## 2020-01-28 DIAGNOSIS — R058 Other specified cough: Secondary | ICD-10-CM

## 2020-01-28 MED ORDER — BENZONATATE 100 MG PO CAPS
100.0000 mg | ORAL_CAPSULE | Freq: Three times a day (TID) | ORAL | 0 refills | Status: DC
Start: 2020-01-28 — End: 2020-06-05

## 2020-01-28 NOTE — ED Provider Notes (Signed)
[name]    CSN: [id number] Arrival date & time: 01/28/20  1200      History   Chief Complaint Chief Complaint  Patient presents with  . Appointment  . Cough    HPI Jennifer Erickson is a 49 y.o. female.   HPI  Jennifer Erickson is a 49 y.o. female presenting to UC with c/o continued dry cough that is worse at night since being dx and treated for sinusitis on 01/14/20. She has completed her 10 day course of cefdinir and states her sinus congestion and headache have resolved. Denies fever, chills, n/v/d. No chest pain or SOB.    Past Medical History:  Diagnosis Date  . Lupus ([place])   . Personal history of amenorrhea   . Sinusitis     Patient Active Problem List   Diagnosis Date Noted  . PND (post-nasal drip) 01/24/2020  . Trochanteric bursitis, right hip 01/04/2020  . Numbness and tingling in both hands 08/18/2019  . Lumbar degenerative disc disease 06/06/2019  . Pain of left heel 10/19/2018  . Effect of high altitude on sinuses 07/23/2018  . Metabolic syndrome with hyperglycemia 07/23/2018  . 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 Elevated blood pressure reading 09/24/2017  . 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 Palpitations 07/11/2015  . OSA on CPAP 07/10/2015  . Large breasts 05/28/2015  . Left tennis elbow 05/28/2015  . Primary osteoarthritis of both knees 12/08/2014  . Vitamin D deficiency 04/19/2014  . Bilateral swelling of feet 05/02/2013  . Left breast mass 11/22/2012  . Obstructive sleep apnea 11/22/2012  . Plantar fasciitis, bilateral 11/01/2012  . Insomnia 12/30/2011  . Obesity, Class III, BMI 40-49.9 (morbid obesity) ([place]) 08/19/2011    Past Surgical History:  Procedure Laterality Date  . DILATION AND CURETTAGE OF UTERUS    . TONSILLECTOMY      OB History    Gravida  0   Para  0   Term  0   Preterm  0   AB  0   Living  0       SAB  0   TAB  0   Ectopic  0   Multiple  0   Live Births  0            Home Medications    Prior to Admission medications   Medication Sig Start Date End Date Taking? Authorizing Provider  albuterol (VENTOLIN HFA) 108 (90 Base) MCG/ACT inhaler  01/26/19   [provider]  AMBULATORY NON FORMULARY MEDICATION Continuous positive airway pressure (CPAP) machine auto-titrate from 4-20 cm of H2O pressure, with all supplemental supplies as needed.  Send 30 day report to Dr Georgina Snell [phone] after 30 days 03/11/18   Gregor Hams, MD  benzonatate (TESSALON) 100 MG capsule Take 1-2 capsules (100-200 mg total) by mouth every 8 (eight) hours. 01/28/20   Noe Gens, PA-C  Continuous Blood Gluc Receiver (FREESTYLE LIBRE 14 DAY READER) DEVI APPLY 1 APPLICATION EVERY 14 DAYS WITH SENSOR SYSTEM 01/18/20   Breeback, Jade L, PA-C  Continuous Blood Gluc Sensor (FREESTYLE LIBRE 14 DAY SENSOR) MISC APPLY TO UPPER DELTOID EVERY 14 DAYS. USE WITH READER TO DETERMINE BLOOD SUGARS 08/04/19   Silverio Decamp, MD  EPINEPHrine (EPIPEN 2-PAK) 0.3 mg/0.3 mL IJ SOAJ injection INJECT 0.3 MLS (0.3 MG TOTAL) INTO THE MUSCLE ONCE. Patient not taking: Reported on 01/24/2020 01/14/20   Leeroy Cha  O, PA-C  etonogestrel (IMPLANON) 68 MG IMPL implant Inject 1 each into the skin once.    [provider]  hydroxychloroquine (PLAQUENIL) 200 MG tablet Take 200 mg by mouth 2 (two) times daily. 12/03/17   [provider]    Family History Family History  Problem Relation Age of Onset  . Cancer Mother        brain  . Hypertension Father   . Diabetes Brother     Social History Social History   Tobacco Use  . Smoking status: Never Smoker  . Smokeless tobacco: Never Used  Vaping Use  . Vaping Use: Never used  Substance Use Topics  . Alcohol use: No  . Drug use: No     Allergies   Aspirin, Augmentin [amoxicillin-pot clavulanate], Ciprofloxacin, Hydrocodone, and Ivp dye  [iodinated diagnostic agents]   Review of Systems Review of Systems  Constitutional: Negative for chills and fever.  HENT: Negative for congestion, ear pain, sore throat, trouble swallowing and voice change.   Respiratory: Positive for cough. Negative for chest tightness, shortness of breath and wheezing.   Cardiovascular: Negative for chest pain and palpitations.  Gastrointestinal: Negative for abdominal pain, diarrhea, nausea and vomiting.  Musculoskeletal: Negative for arthralgias, back pain and myalgias.  Skin: Negative for rash.  All other systems reviewed and are negative.    Physical Exam Triage Vital Signs ED Triage Vitals  Enc Vitals Group     BP 01/28/20 1221 124/88     Pulse Rate 01/28/20 1221 91     Resp 01/28/20 1221 18     Temp 01/28/20 1221 98.6 F (37 C)     Temp Source 01/28/20 1221 Oral     SpO2 01/28/20 1221 98 %     Weight 01/28/20 1222 279 lb 15.8 oz (127 kg)     Height 01/28/20 1222 5' 5"  (1.651 m)     Head Circumference --      Peak Flow --      Pain Score 01/28/20 1221 0     Pain Loc --      Pain Edu? --      Excl. in [place]? --    No data found.  Updated Vital Signs BP 124/88 (BP Location: Right Arm)   Pulse 91   Temp 98.6 F (37 C) (Oral)   Resp 18   Ht 5' 5"  (1.651 m)   Wt 279 lb 15.8 oz (127 kg)   SpO2 98%   BMI 46.59 kg/m   Visual Acuity Right Eye Distance:   Left Eye Distance:   Bilateral Distance:    Right Eye Near:   Left Eye Near:    Bilateral Near:     Physical Exam Vitals and nursing note reviewed.  Constitutional:      General: She is not in acute distress.    Appearance: Normal appearance. She is well-developed. She is obese. She is not ill-appearing, toxic-appearing or diaphoretic.  HENT:     Head: Normocephalic and atraumatic.     Right Ear: Tympanic membrane and ear canal normal.     Left Ear: Tympanic membrane and ear canal normal.     Nose: Nose normal.     Mouth/Throat:     Mouth: Mucous membranes are moist.       Pharynx: Oropharynx is clear.  Cardiovascular:     Rate and Rhythm: Normal rate and regular rhythm.  Pulmonary:     Effort: Pulmonary effort is normal. No respiratory distress.  Breath sounds: Normal breath sounds. No stridor. No wheezing, rhonchi or rales.  Musculoskeletal:        General: Normal range of motion.     Cervical back: Normal range of motion and neck supple. No tenderness.  Lymphadenopathy:     Cervical: No cervical adenopathy.  Skin:    General: Skin is warm and dry.  Neurological:     Mental Status: She is alert and oriented to person, place, and time.  Psychiatric:        Behavior: Behavior normal.      UC Treatments / Results  Labs (all labs ordered are listed, but only abnormal results are displayed) Labs Reviewed  COVID-19, FLU A+B AND RSV    EKG   Radiology DG Chest 2 View  Result Date: 01/28/2020 CLINICAL DATA:  Persistent cough for several weeks. EXAM: CHEST - 2 VIEW COMPARISON:  Chest radiograph dated 11/18/2017. FINDINGS: The heart size and mediastinal contours are within normal limits. Both lungs are clear. The visualized skeletal structures are unremarkable. IMPRESSION: No active cardiopulmonary disease. Electronically Signed   By: Zerita Boers M.D.   On: 01/28/2020 12:53    Procedures Procedures (including critical care time)  Medications Ordered in UC Medications - No data to display  Initial Impression / Assessment and Plan / UC Course  I have reviewed the triage vital signs and the nursing notes.  Pertinent labs & imaging results that were available during my care of the patient were reviewed by me and considered in my medical decision making (see chart for details).     Reviewed CXR with pt Lungs: CTAB COVID/RSV/Flu test sent to lab (pt had Pfizer COVID vaccine) Encouraged f/u with PCP next week if not improving AVS and work note provided  Final Clinical Impressions(s) / UC Diagnoses   Final diagnoses:  Cough   Post-viral cough syndrome   Discharge Instructions   None    ED Prescriptions    Medication Sig Dispense Auth. Provider   benzonatate (TESSALON) 100 MG capsule Take 1-2 capsules (100-200 mg total) by mouth every 8 (eight) hours. 21 capsule Noe Gens, [place]     PDMP not reviewed this encounter.   Noe Gens, [place] 01/28/20 1321

## 2020-01-28 NOTE — ED Triage Notes (Signed)
Here for continued cough - feels better, but concerned about cough ongoing Did not do well w/ cough syrup Robitussin -min relief

## 2020-01-29 LAB — COVID-19, FLU A+B AND RSV
Influenza A, NAA: NOT DETECTED
Influenza B, NAA: NOT DETECTED
RSV, NAA: NOT DETECTED
SARS-CoV-2, NAA: NOT DETECTED

## 2020-01-30 ENCOUNTER — Encounter: Payer: Self-pay | Admitting: Physician Assistant

## 2020-01-31 LAB — COLOGUARD
COLOGUARD: NEGATIVE
Cologuard: NEGATIVE

## 2020-01-31 MED FILL — FREESTYLE LIBRE 14 DAY SENS: 28 days supply | Qty: 2 | Fill #3

## 2020-01-31 MED FILL — FREESTYLE LIBRE 14 DAY READ: 30 days supply | Qty: 1 | Fill #0

## 2020-03-29 ENCOUNTER — Encounter: Payer: Self-pay | Admitting: Physician Assistant

## 2020-04-02 ENCOUNTER — Encounter: Payer: Self-pay | Admitting: Nurse Practitioner

## 2020-04-05 ENCOUNTER — Ambulatory Visit (INDEPENDENT_AMBULATORY_CARE_PROVIDER_SITE_OTHER): Payer: Federal, State, Local not specified - PPO | Admitting: Otolaryngology

## 2020-04-10 ENCOUNTER — Telehealth (INDEPENDENT_AMBULATORY_CARE_PROVIDER_SITE_OTHER): Payer: Federal, State, Local not specified - PPO | Admitting: Sports Medicine

## 2020-04-10 ENCOUNTER — Other Ambulatory Visit (HOSPITAL_BASED_OUTPATIENT_CLINIC_OR_DEPARTMENT_OTHER): Payer: Self-pay | Admitting: Sports Medicine

## 2020-04-10 DIAGNOSIS — M25512 Pain in left shoulder: Secondary | ICD-10-CM

## 2020-04-10 MED ORDER — PREDNISONE 50 MG PO TABS: ORAL_TABLET | ORAL | 0 refills | Status: DC

## 2020-04-10 MED FILL — predniSONE 50 MG TABS: 50 | 5 days supply | Qty: 5 | Fill #0

## 2020-04-10 NOTE — Progress Notes (Signed)
   Virtual Visit via WebEx/MyChart   I connected with  Curtis Uriarte Johnson-Holley  on 04/10/20 via WebEx/MyChart/Doximity Video and verified that I am speaking with the correct person using two identifiers.   I discussed the limitations, risks, security and privacy concerns of performing an evaluation and management service by WebEx/MyChart/Doximity Video, including the higher likelihood of inaccurate diagnosis and treatment, and the availability of in person appointments.  We also discussed the likely need of an additional face to face encounter for complete and high quality delivery of care.  I also discussed with the patient that there may be a patient responsible charge related to this service. The patient expressed understanding and wishes to proceed.  Provider location is in medical facility. Patient location is at their home, different from provider location. People involved in care of the patient during this telehealth encounter were myself, my nurse/medical assistant, and my front office/scheduling team member.  Review of Systems: No fevers, chills, night sweats, weight loss, chest pain, or shortness of breath.   Objective Findings:    General: Speaking full sentences, no audible heavy breathing.  Sounds alert and appropriately interactive.  Appears well.  Face symmetric.  Extraocular movements intact.  Pupils equal and round.  No nasal flaring or accessory muscle use visualized.  Independent interpretation of tests performed by another provider:   None.  Brief History, Exam, Impression, and Recommendations:    Left shoulder pain This is a pleasant 50 year old female, she is having a recurrence of left shoulder pain, impingement in nature per her description on the video visit though I am unable to perform a physical exam, we agreed to try 5 days of prednisone, at which point I will see her back in a week or 2 if not better and we can get a good physical exam in.   I discussed the  above assessment and treatment plan with the patient. The patient was provided an opportunity to ask questions and all were answered. The patient agreed with the plan and demonstrated an understanding of the instructions.   The patient was advised to call back or seek an in-person evaluation if the symptoms worsen or if the condition fails to improve as anticipated.   I provided 30 minutes of face to face and non-face-to-face time during this encounter date, time was needed to gather information, review chart, records, communicate/coordinate with staff remotely, as well as complete documentation.   ___________________________________________ Gwen Her. Dianah Field, M.D., ABFM., CAQSM. Primary Care and [place] Instructor of [employer] [facility]

## 2020-04-10 NOTE — Assessment & Plan Note (Signed)
This is a pleasant 50 year old female, she is having a recurrence of left shoulder pain, impingement in nature per her description on the video visit though I am unable to perform a physical exam, we agreed to try 5 days of prednisone, at which point I will see her back in a week or 2 if not better and we can get a good physical exam in.

## 2020-04-11 MED FILL — FREESTYLE LIBRE 14 DAY SENS: 28 days supply | Qty: 2 | Fill #3

## 2020-05-01 DIAGNOSIS — M25512 Pain in left shoulder: Secondary | ICD-10-CM

## 2020-05-02 ENCOUNTER — Other Ambulatory Visit (HOSPITAL_BASED_OUTPATIENT_CLINIC_OR_DEPARTMENT_OTHER): Payer: Self-pay | Admitting: Sports Medicine

## 2020-05-02 MED ORDER — PREDNISONE 10 MG (48) PO TBPK: ORAL_TABLET | Freq: Every day | ORAL | 0 refills | Status: DC

## 2020-05-02 MED FILL — predniSONE 10 MG TABS: 10 | 12 days supply | Qty: 48 | Fill #0

## 2020-05-02 NOTE — Telephone Encounter (Signed)
I spent 5 total minutes of online digital evaluation and management services.

## 2020-05-08 ENCOUNTER — Other Ambulatory Visit (HOSPITAL_BASED_OUTPATIENT_CLINIC_OR_DEPARTMENT_OTHER): Payer: Self-pay | Admitting: Internal Medicine

## 2020-05-08 ENCOUNTER — Ambulatory Visit: Payer: Federal, State, Local not specified - PPO | Attending: Internal Medicine

## 2020-05-08 DIAGNOSIS — Z23 Encounter for immunization: Secondary | ICD-10-CM

## 2020-05-08 MED FILL — FREESTYLE LIBRE 14 DAY SENS: 28 days supply | Qty: 2 | Fill #4

## 2020-05-08 MED FILL — PFIZER-BIONT COVID-19 VAC-T: 30 | 21 days supply | Qty: 2 | Fill #0

## 2020-05-08 MED FILL — HYDROXYCHLOROQUINE 200 MG T: 200 | 90 days supply | Qty: 180 | Fill #1

## 2020-05-08 NOTE — Progress Notes (Signed)
   Covid-19 Vaccination Clinic  Name:  BRUNILDA EBLE    MRN: [id number] DOB: 05/04/1970  05/08/2020  [name] was observed post Covid-19 immunization for 15 minutes without incident. She was provided with Vaccine Information Sheet and instruction to access the V-Safe system.   Vaccinated By: Allayne Butcher  [name] was instructed to call 911 with any severe reactions post vaccine: [name] Difficulty breathing  . Swelling of face and throat  . A fast heartbeat  . A bad rash all over body  . Dizziness and weakness   Immunizations Administered    Name Date Dose VIS Date Route   PFIZER Comrnaty(Gray TOP) Covid-19 Vaccine 05/08/2020 12:46 PM 0.3 mL 03/08/2020 Intramuscular   Manufacturer: [place]   Lot: [id number]   NDC: [phone]

## 2020-05-28 ENCOUNTER — Ambulatory Visit (INDEPENDENT_AMBULATORY_CARE_PROVIDER_SITE_OTHER): Payer: Federal, State, Local not specified - PPO | Admitting: Physician Assistant

## 2020-05-28 DIAGNOSIS — Z5329 Procedure and treatment not carried out because of patient's decision for other reasons: Secondary | ICD-10-CM

## 2020-05-28 NOTE — Progress Notes (Signed)
No show

## 2020-05-31 DIAGNOSIS — Z79899 Other long term (current) drug therapy: Secondary | ICD-10-CM | POA: Diagnosis not present

## 2020-06-04 MED FILL — FREESTYLE LIBRE 14 DAY SENS: 28 days supply | Qty: 2 | Fill #5

## 2020-06-05 ENCOUNTER — Other Ambulatory Visit (HOSPITAL_BASED_OUTPATIENT_CLINIC_OR_DEPARTMENT_OTHER): Payer: Self-pay | Admitting: Physician Assistant

## 2020-06-05 ENCOUNTER — Ambulatory Visit (INDEPENDENT_AMBULATORY_CARE_PROVIDER_SITE_OTHER): Payer: Federal, State, Local not specified - PPO | Admitting: Physician Assistant

## 2020-06-05 ENCOUNTER — Other Ambulatory Visit: Payer: Self-pay

## 2020-06-05 ENCOUNTER — Encounter: Payer: Self-pay | Admitting: Physician Assistant

## 2020-06-05 VITALS — BP 123/79 | HR 91 | Temp 98.3°F | Ht 65.0 in | Wt 288.0 lb

## 2020-06-05 DIAGNOSIS — R519 Headache, unspecified: Secondary | ICD-10-CM | POA: Diagnosis not present

## 2020-06-05 DIAGNOSIS — J0140 Acute pansinusitis, unspecified: Secondary | ICD-10-CM

## 2020-06-05 DIAGNOSIS — Z23 Encounter for immunization: Secondary | ICD-10-CM

## 2020-06-05 MED ORDER — DEXAMETHASONE SODIUM PHOSPHATE 10 MG/ML IJ SOLN
8.0000 mg | Freq: Once | INTRAMUSCULAR | Status: AC
  Administered 2020-06-05: 8 mg via INTRAMUSCULAR

## 2020-06-05 MED ORDER — AZITHROMYCIN 250 MG PO TABS: ORAL_TABLET | ORAL | 0 refills | Status: DC

## 2020-06-05 MED FILL — AZITHROMYCIN 250 MG TABLET: 250 | 5 days supply | Qty: 6 | Fill #0

## 2020-06-05 NOTE — Patient Instructions (Signed)

## 2020-06-05 NOTE — Progress Notes (Signed)
Subjective:    Patient ID: Jennifer Erickson, female    DOB: November 27, 1970, 50 y.o.   MRN: [id number]  HPI  Patient is a 50 year old obese female who presents to the clinic with a headache for the last 2 weeks.  Is been fairly constant but has waxed and waned from day-to-day.  It is located occipitally in the back of her head but radiates to behind her left.  She denies any vision changes, nausea, vomiting, speech changes, upper extremity weakness.  She has ongoing neck pain and tightness.  She denies any injury.  Patient is also having a lot of sinus pressure and congestion.  She is blowing up a lot of purulent sputum from a cold she had a few weeks ago.  She feels like this could be a sinus infection.  She denies any fever, chills, body aches, nausea, vomiting, shortness of breath.  She has been taking over-the-counter Mucinex, Tylenol, ibuprofen, nasal sprays with little relief.  .. Active Ambulatory Problems    Diagnosis Date Noted  . Obesity, Class III, BMI 40-49.9 (morbid obesity) ([place]) 08/19/2011  . Uvular swelling 08/19/2011  . Insomnia 12/30/2011  . Plantar fasciitis, bilateral 11/01/2012  . Left breast mass 11/22/2012  . Obstructive sleep apnea 11/22/2012  . Bilateral swelling of feet 05/02/2013  . Vitamin D deficiency 04/19/2014  . Primary osteoarthritis of both knees 12/08/2014  . Large breasts 05/28/2015  . Left tennis elbow 05/28/2015  . OSA on CPAP 07/10/2015  . Palpitations 07/11/2015  . 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 Left shoulder pain 12/03/2016  . Elevated blood pressure reading 09/24/2017  . 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 Effect of high altitude on sinuses 07/23/2018  . Metabolic syndrome with hyperglycemia 07/23/2018  . Pain of left heel 10/19/2018  . Lumbar degenerative disc disease 06/06/2019  . Numbness and tingling in both hands 08/18/2019  . Trochanteric  bursitis, right hip 01/04/2020  . PND (post-nasal drip) 01/24/2020   Resolved Ambulatory Problems    Diagnosis Date Noted  . Headache(784.0) 08/19/2011  . Uvular swelling 12/30/2011  . Dermatitis 05/26/2012  . Left lumbar radiculitis 01/25/2014  . Flank pain 11/01/2014  . Right-sided low back pain without sciatica 05/07/2015  . Cramps of left lower extremity 11/28/2015  . Polyarthralgia 02/18/2016  . Toe fracture, right 04/15/2016  . Lateral epicondylitis of both elbows 12/03/2016  . Grief reaction 12/26/2016  . Coughing 07/03/2017  . Acute frontal sinusitis 06/18/2018  . Annual physical exam 02/17/2018   Past Medical History:  Diagnosis Date  . Lupus ([place])   . Personal history of amenorrhea   . Sinusitis        Review of Systems See HPI.     Objective:   Physical Exam Vitals reviewed.  Constitutional:      Appearance: She is well-developed. She is obese.  HENT:     Head: Normocephalic.     Mouth/Throat:     Mouth: Mucous membranes are moist.  Eyes:     General: No visual field deficit.    Extraocular Movements: Extraocular movements intact.     Pupils: Pupils are equal, round, and reactive to light.  Neck:     Comments: Tight paraspinal muscles of cervical region.  [place] hump Cardiovascular:     Rate and Rhythm: Normal rate and regular rhythm.     Heart sounds: Normal heart sounds.  Pulmonary:     Effort: Pulmonary effort  is normal.     Breath sounds: Normal breath sounds.  Musculoskeletal:     Cervical back: Normal range of motion.  Lymphadenopathy:     Cervical: No cervical adenopathy.  Neurological:     Mental Status: She is alert and oriented to person, place, and time.     Cranial Nerves: No cranial nerve deficit or facial asymmetry.     Gait: Gait normal.  Psychiatric:        Mood and Affect: Mood normal.           Assessment & Plan:  Marland KitchenMarland KitchenShanicqua was seen today for headache.  Diagnoses and all orders for this visit:  Acute  nonintractable headache, unspecified headache type -     dexamethasone (DECADRON) injection 8 mg  Needs flu shot -     Flu Vaccine QUAD 36+ mos IM  Acute non-recurrent pansinusitis -     azithromycin (ZITHROMAX Z-PAK) 250 MG tablet; Take 2 tablets (500 mg) on  Day 1,  followed by 1 tablet (250 mg) once daily on Days 2 through 5. -     dexamethasone (DECADRON) injection 8 mg   Decadron IM given for headache. Will treat for sinusitis since pt did have cold a few weeks ago.  Concern more for tension headache.  Start zpak.  Keep headache diary.  Consider massage of neck/upper back.  Sleep on a good pillow.  Get eyes checked.

## 2020-06-25 DIAGNOSIS — Z79899 Other long term (current) drug therapy: Secondary | ICD-10-CM | POA: Diagnosis not present

## 2020-07-02 ENCOUNTER — Other Ambulatory Visit (HOSPITAL_BASED_OUTPATIENT_CLINIC_OR_DEPARTMENT_OTHER): Payer: Self-pay

## 2020-07-02 MED FILL — Continuous Glucose System Sensor: 28 days supply | Qty: 2 | Fill #0 | Status: AC

## 2020-07-17 ENCOUNTER — Other Ambulatory Visit: Payer: Self-pay

## 2020-07-17 ENCOUNTER — Ambulatory Visit (INDEPENDENT_AMBULATORY_CARE_PROVIDER_SITE_OTHER): Payer: Federal, State, Local not specified - PPO | Admitting: Medical-Surgical

## 2020-07-17 ENCOUNTER — Encounter: Payer: Self-pay | Admitting: Medical-Surgical

## 2020-07-17 VITALS — BP 125/79 | HR 89 | Temp 98.7°F | Ht 65.0 in | Wt 289.3 lb

## 2020-07-17 DIAGNOSIS — R5383 Other fatigue: Secondary | ICD-10-CM

## 2020-07-17 NOTE — Progress Notes (Signed)
Subjective:    CC: Fatigue  HPI: Pleasant 50 year old female presenting today for evaluation of significant fatigue.  She recently had a couple of days of severe fatigue that interfered with her ability to function normally.  Notes that she was so tired at work that she had to leave.  When she did leave, she had a friend pick her up at the door and drive her to her own car because the effort to walk to her car was just too much.  Notes that she exercises regularly and has been trying to take care of her self and be aware of the measures she has to.followup With having lupus.  She has made some dietary changes lately since she has tried lose weight.  She is cut back on dietary intake of simple carbohydrates and concentrated sugars.  On the days when she was fatigued, she did note that her blood sugars dropped down as low as the 40s.  Over the last day and a half, her fatigue has improved so she is not sure if the fatigue was related to hypoglycemia or if there was some other reason.  Of note, she is not currently using her CPAP as her equipment has been recalled and she has not received a new one yet.  I reviewed the past medical history, family history, social history, surgical history, and allergies today and no changes were needed.  Please see the problem list section below in epic for further details.  Past Medical History: Past Medical History:  Diagnosis Date  . Lupus ([place])   . Personal history of amenorrhea   . Sinusitis    Past Surgical History: Past Surgical History:  Procedure Laterality Date  . DILATION AND CURETTAGE OF UTERUS    . TONSILLECTOMY     Social History: Social History   Socioeconomic History  . Marital status: Widowed    Spouse name: Not on file  . Number of children: Not on file  . Years of education: Not on file  . Highest education level: Not on file  Occupational History  . Not on file  Tobacco Use  . Smoking status: Never Smoker  . Smokeless tobacco:  Never Used  Vaping Use  . Vaping Use: Never used  Substance and Sexual Activity  . Alcohol use: No  . Drug use: No  . Sexual activity: Not Currently    Birth control/protection: Pill    Comment: 1st intercourse- 19, partners-1, widow  Other Topics Concern  . Not on file  Social History Narrative  . Not on file   Social Determinants of Health   Financial Resource Strain: Not on file  Food Insecurity: Not on file  Transportation Needs: Not on file  Physical Activity: Not on file  Stress: Not on file  Social Connections: Not on file   Family History: Family History  Problem Relation Age of Onset  . Cancer Mother        brain  . Hypertension Father   . Diabetes Brother    Allergies: Allergies  Allergen Reactions  . Aspirin Nausea And Vomiting and Other (See Comments)  . Augmentin [Amoxicillin-Pot Clavulanate] Diarrhea  . Ciprofloxacin Nausea And Vomiting  . Hydrocodone Nausea Only    Cough syrup  . Ivp Dye [Iodinated Diagnostic Agents]    Medications: See med rec.  Review of Systems: See HPI for pertinent positives and negatives.   Objective:    General: Well Developed, well nourished, and in no acute distress.  Neuro: Alert and  oriented x3.  HEENT: Normocephalic, atraumatic.  Skin: Warm and dry. Cardiac: Regular rate and rhythm, no murmurs rubs or gallops, no lower extremity edema.  Respiratory: Clear to auscultation bilaterally. Not using accessory muscles, speaking in full sentences.  Impression and Recommendations:    1. Fatigue, unspecified type Strongly suspect her fatigue was related to hypoglycemia secondary to dietary changes and limitation of carbohydrate intake.  We will go ahead and check CMP, CBC with differential, TSH, and iron panel to make sure there are no other contributing factors.  Untreated sleep apnea may also be contributing to her fatigue.  Recommend resuming use of CPAP once new supplies have arrived. - COMPLETE METABOLIC PANEL WITH  GFR - CBC with Differential/Platelet - TSH - Fe+TIBC+Fer  Return if symptoms worsen or fail to improve. __________________________________________ Clearnce Sorrel, DNP, APRN, FNP-BC Primary Care and [place]

## 2020-07-18 ENCOUNTER — Other Ambulatory Visit (HOSPITAL_BASED_OUTPATIENT_CLINIC_OR_DEPARTMENT_OTHER): Payer: Self-pay

## 2020-07-18 DIAGNOSIS — Z79899 Other long term (current) drug therapy: Secondary | ICD-10-CM | POA: Diagnosis not present

## 2020-07-18 DIAGNOSIS — L930 Discoid lupus erythematosus: Secondary | ICD-10-CM | POA: Diagnosis not present

## 2020-07-18 DIAGNOSIS — G4733 Obstructive sleep apnea (adult) (pediatric): Secondary | ICD-10-CM | POA: Diagnosis not present

## 2020-07-18 LAB — CBC WITH DIFFERENTIAL/PLATELET
Absolute Monocytes: 650 cells/uL (ref 200–950)
Basophils Absolute: 36 cells/uL (ref 0–200)
Basophils Relative: 0.4 %
Eosinophils Absolute: 249 cells/uL (ref 15–500)
Eosinophils Relative: 2.8 %
HCT: 44.5 % (ref 35.0–45.0)
Hemoglobin: 14.7 g/dL (ref 11.7–15.5)
Lymphs Abs: 2697 cells/uL (ref 850–3900)
MCH: 30.7 pg (ref 27.0–33.0)
MCHC: 33 g/dL (ref 32.0–36.0)
MCV: 92.9 fL (ref 80.0–100.0)
MPV: 10.5 fL (ref 7.5–12.5)
Monocytes Relative: 7.3 %
Neutro Abs: 5269 cells/uL (ref 1500–7800)
Neutrophils Relative %: 59.2 %
Platelets: 311 10*3/uL (ref 140–400)
RBC: 4.79 10*6/uL (ref 3.80–5.10)
RDW: 12.4 % (ref 11.0–15.0)
Total Lymphocyte: 30.3 %
WBC: 8.9 10*3/uL (ref 3.8–10.8)

## 2020-07-18 LAB — COMPLETE METABOLIC PANEL WITH GFR
AG Ratio: 0.9 (calc) — ABNORMAL LOW (ref 1.0–2.5)
ALT: 18 U/L (ref 6–29)
AST: 17 U/L (ref 10–35)
Albumin: 4 g/dL (ref 3.6–5.1)
Alkaline phosphatase (APISO): 109 U/L (ref 31–125)
BUN: 17 mg/dL (ref 7–25)
CO2: 26 mmol/L (ref 20–32)
Calcium: 9.7 mg/dL (ref 8.6–10.2)
Chloride: 106 mmol/L (ref 98–110)
Creat: 0.97 mg/dL (ref 0.50–1.10)
GFR, Est African American: 79 mL/min/{1.73_m2} (ref >=60)
GFR, Est Non African American: 69 mL/min/{1.73_m2} (ref >=60)
Globulin: 4.6 g/dL (calc) — ABNORMAL HIGH (ref 1.9–3.7)
Glucose, Bld: 88 mg/dL (ref 65–99)
Potassium: 4.2 mmol/L (ref 3.5–5.3)
Sodium: 140 mmol/L (ref 135–146)
Total Bilirubin: 0.2 mg/dL (ref 0.2–1.2)
Total Protein: 8.6 g/dL — ABNORMAL HIGH (ref 6.1–8.1)

## 2020-07-18 LAB — IRON,TIBC AND FERRITIN PANEL
%SAT: 16 % (calc) (ref 16–45)
Ferritin: 217 ng/mL (ref 16–232)
Iron: 62 ug/dL (ref 40–190)
TIBC: 381 mcg/dL (calc) (ref 250–450)

## 2020-07-18 LAB — TSH: TSH: 2.61 mIU/L

## 2020-07-18 MED ORDER — HYDROXYCHLOROQUINE SULFATE 200 MG PO TABS
200.0000 mg | ORAL_TABLET | Freq: Two times a day (BID) | ORAL | 1 refills | Status: DC
  Filled 2020-07-18 – 2020-07-30 (×2): qty 180, 90d supply, fill #0
  Filled 2020-12-14 – 2020-12-24 (×2): qty 180, 90d supply, fill #1

## 2020-07-20 ENCOUNTER — Encounter: Payer: Self-pay | Admitting: Physician Assistant

## 2020-07-25 ENCOUNTER — Other Ambulatory Visit (HOSPITAL_BASED_OUTPATIENT_CLINIC_OR_DEPARTMENT_OTHER): Payer: Self-pay

## 2020-07-30 ENCOUNTER — Other Ambulatory Visit (HOSPITAL_BASED_OUTPATIENT_CLINIC_OR_DEPARTMENT_OTHER): Payer: Self-pay

## 2020-07-30 MED FILL — Continuous Glucose System Sensor: 28 days supply | Qty: 2 | Fill #1 | Status: AC

## 2020-08-17 ENCOUNTER — Ambulatory Visit (INDEPENDENT_AMBULATORY_CARE_PROVIDER_SITE_OTHER): Payer: Federal, State, Local not specified - PPO | Admitting: Obstetrics & Gynecology

## 2020-08-17 ENCOUNTER — Other Ambulatory Visit: Payer: Self-pay

## 2020-08-17 ENCOUNTER — Encounter: Payer: Self-pay | Admitting: Obstetrics & Gynecology

## 2020-08-17 ENCOUNTER — Other Ambulatory Visit (HOSPITAL_COMMUNITY)
Admission: RE | Admit: 2020-08-17 | Discharge: 2020-08-17 | Disposition: A | Discharge status: Home / Self Care | Payer: Federal, State, Local not specified - PPO | Source: Ambulatory Visit | Attending: Obstetrics & Gynecology | Admitting: Obstetrics & Gynecology

## 2020-08-17 VITALS — BP 138/80 | Ht 65.0 in | Wt 285.0 lb

## 2020-08-17 DIAGNOSIS — Z01419 Encounter for gynecological examination (general) (routine) without abnormal findings: Secondary | ICD-10-CM | POA: Insufficient documentation

## 2020-08-17 DIAGNOSIS — Z1231 Encounter for screening mammogram for malignant neoplasm of breast: Secondary | ICD-10-CM | POA: Diagnosis not present

## 2020-08-17 DIAGNOSIS — Z6841 Body Mass Index (BMI) 40.0 and over, adult: Secondary | ICD-10-CM | POA: Diagnosis not present

## 2020-08-17 DIAGNOSIS — Z3046 Encounter for surveillance of implantable subdermal contraceptive: Secondary | ICD-10-CM | POA: Diagnosis not present

## 2020-08-17 LAB — HM MAMMOGRAPHY

## 2020-08-17 NOTE — Progress Notes (Signed)
Jennifer Erickson 1970-04-08 [id number]   History:    50 y.o.  G0 Widowed x 06/2017. Has 2 dogs. PHD in Psychology.  QA:[id number] presenting for annual gyn exam   ERD:EYCX on Nexplanon since September 2020. No breakthrough bleeding. No pelvic pain. Currently abstinent. Normal vaginal secretions. Urine and bowel movements normal. Breasts normal. Screening mammo scheduled. Body mass index 47.43. Walking the dogs, going to the gym. Health labs with family physician. Systemic lupus erythematosus on Plaquenil followed by rheumatology.  Past medical history,surgical history, family history and social history were all reviewed and documented in the EPIC chart.  Gynecologic History No LMP recorded. Patient has had an implant.  Obstetric History OB History  Gravida Para Term Preterm AB Living  0 0 0 0 0 0  SAB IAB Ectopic Multiple Live Births  0 0 0 0 0     ROS: A ROS was performed and pertinent positives and negatives are included in the history.  GENERAL: No fevers or chills. HEENT: No change in vision, no earache, sore throat or sinus congestion. NECK: No pain or stiffness. CARDIOVASCULAR: No chest pain or pressure. No palpitations. PULMONARY: No shortness of breath, cough or wheeze. GASTROINTESTINAL: No abdominal pain, nausea, vomiting or diarrhea, melena or bright red blood per rectum. GENITOURINARY: No urinary frequency, urgency, hesitancy or dysuria. MUSCULOSKELETAL: No joint or muscle pain, no back pain, no recent trauma. DERMATOLOGIC: No rash, no itching, no lesions. ENDOCRINE: No polyuria, polydipsia, no heat or cold intolerance. No recent change in weight. HEMATOLOGICAL: No anemia or easy bruising or bleeding. NEUROLOGIC: No headache, seizures, numbness, tingling or weakness. PSYCHIATRIC: No depression, no loss of interest in normal activity or change in sleep pattern.     Exam:   BP 138/80   Ht 5' 5"  (1.651 m)   Wt 285 lb (129.3 kg)   BMI 47.43  kg/m   Body mass index is 47.43 kg/m.  General appearance : Well developed well nourished female. No acute distress HEENT: Eyes: no retinal hemorrhage or exudates,  Neck supple, trachea midline, no carotid bruits, no thyroidmegaly Lungs: Clear to auscultation, no rhonchi or wheezes, or rib retractions  Heart: Regular rate and rhythm, no murmurs or gallops Breast:Examined in sitting and supine position were symmetrical in appearance, no palpable masses or tenderness,  no skin retraction, no nipple inversion, no nipple discharge, no skin discoloration, no axillary or supraclavicular lymphadenopathy Abdomen: no palpable masses or tenderness, no rebound or guarding Extremities: no edema or skin discoloration or tenderness  Pelvic: Vulva: Normal             Vagina: No gross lesions or discharge  Cervix: No gross lesions or discharge.  Pap reflex done.  Uterus  AV, normal size, shape and consistency, non-tender and mobile  Adnexa  Without masses or tenderness  Anus: Normal   Assessment/Plan:  50 y.o. female for annual exam   1. Encounter for routine gynecological examination with Papanicolaou smear of cervix Normal gynecologic exam.  Pap reflex done.  Breast exam normal.  Screening mammogram scheduled for next week.  We will schedule a colonoscopy at age 59.  Health labs with family physician.  2. Encounter for surveillance of implantable subdermal contraceptive Well on Nexplanon since September 2020.  No contraindication to continue.  3. Class 3 severe obesity due to excess calories with serious comorbidity and body mass index (BMI) of 45.0 to 49.9 in adult [facility]) Recommend a lower calorie/carb diet.  Aerobic activities 5 times a week and  light weightlifting every 2 days.  Princess Bruins MD, 2:22 PM 08/17/2020

## 2020-08-20 ENCOUNTER — Encounter: Payer: Self-pay | Admitting: Physician Assistant

## 2020-08-20 DIAGNOSIS — Z131 Encounter for screening for diabetes mellitus: Secondary | ICD-10-CM

## 2020-08-20 DIAGNOSIS — Z13220 Encounter for screening for lipoid disorders: Secondary | ICD-10-CM

## 2020-08-20 DIAGNOSIS — E559 Vitamin D deficiency, unspecified: Secondary | ICD-10-CM

## 2020-08-20 LAB — CYTOLOGY - PAP: Diagnosis: NEGATIVE

## 2020-08-21 ENCOUNTER — Encounter: Payer: Self-pay | Admitting: Neurology

## 2020-08-21 NOTE — Telephone Encounter (Signed)
Labs pended.

## 2020-08-24 ENCOUNTER — Other Ambulatory Visit: Payer: Self-pay

## 2020-08-24 ENCOUNTER — Encounter: Payer: Self-pay | Admitting: Family Medicine

## 2020-08-24 ENCOUNTER — Ambulatory Visit: Payer: Federal, State, Local not specified - PPO | Admitting: Family Medicine

## 2020-08-24 VITALS — BP 121/78 | HR 89 | Resp 18 | Wt 284.0 lb

## 2020-08-24 DIAGNOSIS — H6121 Impacted cerumen, right ear: Secondary | ICD-10-CM | POA: Diagnosis not present

## 2020-08-24 DIAGNOSIS — R519 Headache, unspecified: Secondary | ICD-10-CM | POA: Diagnosis not present

## 2020-08-24 MED ORDER — DEXAMETHASONE SODIUM PHOSPHATE 10 MG/ML IJ SOLN
8.0000 mg | Freq: Once | INTRAMUSCULAR | Status: AC
  Administered 2020-08-24: 8 mg via INTRAMUSCULAR

## 2020-08-24 NOTE — Patient Instructions (Signed)
Tension Headache, Adult A tension headache is a feeling of pain, pressure, or aching over the front and sides of the head. The pain can be dull, or it can feel tight. There are two types of tension headache:  Episodic tension headache. This is when the headaches happen fewer than 15 days a month.  Chronic tension headache. This is when the headaches happen more than 15 days a month during a 67-monthperiod. A tension headache can last from 30 minutes to several days. It is the most common kind of headache. Tension headaches are not normally associated with nausea or vomiting, and they do not get worse with physical activity. What are the causes? The exact cause of this condition is not known. Tension headaches are often triggered by stress, anxiety, or depression. Other triggers may include:  Alcohol.  Too much caffeine or caffeine withdrawal.  Respiratory infections, such as colds, flu, or sinus infections.  Dental problems or teeth clenching.  Fatigue.  Holding your head and neck in the same position for a long period of time, such as while using a computer.  Smoking.  Arthritis of the neck. What are the signs or symptoms? Symptoms of this condition include:  A feeling of pressure or tightness around the head.  Dull, aching head pain.  Pain over the front and sides of the head.  Tenderness in the muscles of the head, neck, and shoulders. How is this diagnosed? This condition may be diagnosed based on your symptoms, your medical history, and a physical exam. If your symptoms are severe or unusual, you may have imaging tests, such as a CT scan or an MRI of your head. Your vision may also be checked. How is this treated? This condition may be treated with lifestyle changes and with medicines that help relieve symptoms. Follow these instructions at home: Managing pain  Take over-the-counter and prescription medicines only as told by your health care provider.  When you have  a headache, lie down in a dark, quiet room.  If directed, put ice on your head and neck. To do this: ? Put ice in a plastic bag. ? Place a towel between your skin and the bag. ? Leave the ice on for 20 minutes, 2-3 times a day. ? Remove the ice if your skin turns bright red. This is very important. If you cannot feel pain, heat, or cold, you have a greater risk of damage to the area.  If directed, apply heat to the back of your neck as often as told by your health care provider. Use the heat source that your health care provider recommends, such as a moist heat pack or a heating pad. ? Place a towel between your skin and the heat source. ? Leave the heat on for 20-30 minutes. ? Remove the heat if your skin turns bright red. This is especially important if you are unable to feel pain, heat, or cold. You have a greater risk of getting burned. Eating and drinking  Eat meals on a regular schedule.  If you drink alcohol: ? Limit how much you have to:  0-1 drink a day for women who are not pregnant.  0-2 drinks a day for men. ? Know how much alcohol is in your drink. In the [place] one drink equals one 12 oz bottle of beer (355 mL), one 5 oz glass of wine (148 mL), or one 1 oz glass of hard liquor (44 mL).  Drink enough fluid to keep your urine  pale yellow.  Decrease your caffeine intake, or stop using caffeine. Lifestyle  Get 7-9 hours of sleep each night, or get the amount of sleep recommended by your health care provider.  At bedtime, remove computers, phones, and tablets from your room.  Find ways to manage your stress. This may include: ? Exercise. ? Deep breathing exercises. ? Yoga. ? Listening to music. ? Positive mental imagery.  Try to sit up straight and avoid tensing your muscles.  Do not use any products that contain nicotine or tobacco. These include cigarettes, chewing tobacco, and vaping devices, such as e-cigarettes. If you need help quitting, ask your health care  provider. General instructions  Avoid any headache triggers. Keep a journal to help find out what may trigger your headaches. For example, write down: ? What you eat and drink. ? How much sleep you get. ? Any change to your diet or medicines.  Keep all follow-up visits. This is important.   Contact a health care provider if:  Your headache does not get better.  Your headache comes back.  You are sensitive to sounds, light, or smells because of a headache.  You have nausea or you vomit.  Your stomach hurts. Get help right away if:  You suddenly develop a severe headache, along with any of the following: ? A stiff neck. ? Nausea and vomiting. ? Confusion. ? Weakness in one part or one side of your body. ? Double vision or loss of vision. ? Shortness of breath. ? Rash. ? Unusual sleepiness. ? Fever or chills. ? Trouble speaking. ? Pain in your eye or ear. ? Trouble walking or balancing. ? Feeling faint or passing out. Summary  A tension headache is a feeling of pain, pressure, or aching over the front and sides of the head.  A tension headache can last from 30 minutes to several days. It is the most common kind of headache.  This condition may be diagnosed based on your symptoms, your medical history, and a physical exam.  This condition may be treated with lifestyle changes and with medicines that help relieve symptoms. This information is not intended to replace advice given to you by your health care provider. Make sure you discuss any questions you have with your health care provider. Document Revised: 12/15/2019 Document Reviewed: 12/15/2019 Elsevier Patient Education  2021 [employer].

## 2020-08-24 NOTE — Progress Notes (Signed)
Acute Office Visit  Subjective:    Patient ID: Jennifer Erickson, female    DOB: September 03, 1970, 50 y.o.   MRN: [id number]  Chief Complaint  Patient presents with  . Headache    HPI Patient is in today for headaches.  Patient with off-and-on headache for the past 5 days.  She states it is mostly posterior, wrapping around both sides of her head.  She has been able to sleepfine.  Blood pressure has been normal.  She reports some nasal congestion with bilateral ear pressure 3 out of 10, but denies fevers, sore throat, cough, dyspnea, chest pain, sinus pressure.  She has been taking Sudafed and Zyrtec -minimal relief.  Headache fluctuates throughout the day from 5-8 out of 10 pressure type pain typically lasting for a few hours before resolving and then returning.  She denies any nausea, vomiting, diarrhea, photophobia, phonophobia, aura, unilateral headaches, fevers, lethargy.   States she does not routinely have headaches and has only had one true migraine that she can remember and it was significantly worse than what she currently feels.   No recent illnesses, sleeping good, COVID-negative at work a few days ago.  Up-to-date on eye exam.  No changes to caffeine, alcohol intake, diet, medications.   Past Medical History:  Diagnosis Date  . Lupus ([place])   . Personal history of amenorrhea   . Sinusitis     Past Surgical History:  Procedure Laterality Date  . DILATION AND CURETTAGE OF UTERUS    . TONSILLECTOMY      Family History  Problem Relation Age of Onset  . Cancer Mother        brain  . Hypertension Father   . Diabetes Brother     Social History   Socioeconomic History  . Marital status: Widowed    Spouse name: Not on file  . Number of children: Not on file  . Years of education: Not on file  . Highest education level: Not on file  Occupational History  . Not on file  Tobacco Use  . Smoking status: Never Smoker  . Smokeless tobacco: Never Used  Vaping Use   . Vaping Use: Never used  Substance and Sexual Activity  . Alcohol use: No  . Drug use: No  . Sexual activity: Not Currently    Birth control/protection: Pill    Comment: 1st intercourse- 19, partners-1, widow  Other Topics Concern  . Not on file  Social History Narrative  . Not on file   Social Determinants of Health   Financial Resource Strain: Not on file  Food Insecurity: Not on file  Transportation Needs: Not on file  Physical Activity: Not on file  Stress: Not on file  Social Connections: Not on file  Intimate Partner Violence: Not on file    Outpatient Medications Prior to Visit  Medication Sig Dispense Refill  . Continuous Blood Gluc Receiver (FREESTYLE LIBRE 14 DAY READER) DEVI APPLY 1 APPLICATION EVERY 14 DAYS WITH SENSOR SYSTEM 1 each 11  . Continuous Blood Gluc Sensor (FREESTYLE LIBRE 14 DAY SENSOR) MISC APPLY TO UPPER DELTOID EVERY 14 DAYS. USE WITH READER TO DETERMINE BLOOD SUGARS 2 each 11  . COVID-19 mRNA Vac-TriS, Pfizer, SUSP injection INJECT AS DIRECTED .3 mL 0  . EPINEPHrine 0.3 mg/0.3 mL IJ SOAJ injection INJECT 0.3 MLS (0.3 MG TOTAL) INTO THE MUSCLE ONCE. 2 each 1  . etonogestrel (IMPLANON) 68 MG IMPL implant Inject 1 each into the skin once.    [name]  hydroxychloroquine (PLAQUENIL) 200 MG tablet TAKE 1 TABLET BY MOUTH TWICE DAILY WITH FOOD OR MILK 180 tablet 1  . hydroxychloroquine (PLAQUENIL) 200 MG tablet TAKE 1 TABLET BY MOUTH TWICE DAILY 180 tablet 1  . nitroGLYCERIN (NITRODUR - DOSED IN MG/24 HR) 0.2 mg/hr patch      No facility-administered medications prior to visit.    Allergies  Allergen Reactions  . Aspirin Nausea And Vomiting and Other (See Comments)  . Augmentin [Amoxicillin-Pot Clavulanate] Diarrhea  . Ciprofloxacin Nausea And Vomiting  . Hydrocodone Nausea Only    Cough syrup  . Ivp Dye [Iodinated Diagnostic Agents]     Review of Systems All review of systems negative except what is listed in the HPI     Objective:    Physical  Exam Vitals reviewed.  Constitutional:      Appearance: She is well-developed. She is obese.  HENT:     Head: Normocephalic and atraumatic.     Comments: R ear with cerumen impaction, TM normal after irrigation Eyes:     Extraocular Movements: Extraocular movements intact.     Pupils: Pupils are equal, round, and reactive to light.  Cardiovascular:     Rate and Rhythm: Normal rate and regular rhythm.  Pulmonary:     Breath sounds: Normal breath sounds.  Abdominal:     General: Bowel sounds are normal.  Musculoskeletal:     Cervical back: Normal range of motion and neck supple. No rigidity.  Lymphadenopathy:     Cervical: No cervical adenopathy.  Skin:    General: Skin is warm and dry.     Findings: No rash.  Neurological:     Mental Status: She is alert and oriented to person, place, and time. Mental status is at baseline.     Cranial Nerves: No cranial nerve deficit.     Motor: No weakness.     Gait: Gait normal.  Psychiatric:        Mood and Affect: Mood normal.        Speech: Speech normal.        Behavior: Behavior normal.     BP 121/78   Pulse 89   Resp 18   SpO2 97%  Wt Readings from Last 3 Encounters:  08/17/20 285 lb (129.3 kg)  07/17/20 289 lb 4.8 oz (131.2 kg)  06/05/20 288 lb (130.6 kg)    Health Maintenance Due  Topic Date Due  . Hepatitis C Screening  Never done  . COLONOSCOPY (Pts 45-26yr Insurance coverage will need to be confirmed)  Never done    There are no preventive care reminders to display for this patient.   Lab Results  Component Value Date   TSH 2.61 07/17/2020   Lab Results  Component Value Date   WBC 8.9 07/17/2020   HGB 14.7 07/17/2020   HCT 44.5 07/17/2020   MCV 92.9 07/17/2020   PLT 311 07/17/2020   Lab Results  Component Value Date   NA 140 07/17/2020   K 4.2 07/17/2020   CO2 26 07/17/2020   GLUCOSE 88 07/17/2020   BUN 17 07/17/2020   CREATININE 0.97 07/17/2020   BILITOT 0.2 07/17/2020   ALKPHOS 104 05/28/2015    AST 17 07/17/2020   ALT 18 07/17/2020   PROT 8.6 (H) 07/17/2020   ALBUMIN 4.2 05/28/2015   CALCIUM 9.7 07/17/2020   Lab Results  Component Value Date   CHOL 167 05/12/2018   Lab Results  Component Value Date   HDL 51 05/12/2018  Lab Results  Component Value Date   LDLCALC 102 (H) 05/12/2018   Lab Results  Component Value Date   TRIG 62 05/12/2018   Lab Results  Component Value Date   CHOLHDL 3.3 05/12/2018   Lab Results  Component Value Date   HGBA1C 6.0 (H) 05/12/2018       Assessment & Plan:   1. Acute nonintractable headache, unspecified headache type  Exam and vitals reassuring and consistent with a tension type headache, supported by muscle tightness and tenderness on exam. Will give IM decadron in office and start prn massage, and heat wraps around neck. Can use OTC analgesics. Pt to call after weekend if sxs not improving, and strict ER precautions given in case of worsening sxs such as worst headache of life, visual or gait disturbances, syncope, etc. For nasal congestion, continue zyrtec, Flonase, humidifier use, hydration, OTC cold medications. If no improvement after the weekend, can treat for sinusitis.  - dexamethasone (DECADRON) injection 8 mg  2. Impacted cerumen of right ear Normal after cerumen irrigated. Tolerated well.    Patient aware of signs/symptoms requiring further/urgent evaluation.  Follow-up if symptoms worsen or fail to improve.    Terrilyn Saver, NP

## 2020-08-28 ENCOUNTER — Ambulatory Visit (INDEPENDENT_AMBULATORY_CARE_PROVIDER_SITE_OTHER): Payer: Federal, State, Local not specified - PPO | Admitting: Physician Assistant

## 2020-08-28 ENCOUNTER — Other Ambulatory Visit: Payer: Self-pay

## 2020-08-28 ENCOUNTER — Other Ambulatory Visit (HOSPITAL_BASED_OUTPATIENT_CLINIC_OR_DEPARTMENT_OTHER): Payer: Self-pay

## 2020-08-28 VITALS — BP 131/71 | HR 85 | Ht 65.0 in | Wt 287.0 lb

## 2020-08-28 DIAGNOSIS — Z0000 Encounter for general adult medical examination without abnormal findings: Secondary | ICD-10-CM

## 2020-08-28 DIAGNOSIS — N62 Hypertrophy of breast: Secondary | ICD-10-CM | POA: Diagnosis not present

## 2020-08-28 DIAGNOSIS — M549 Dorsalgia, unspecified: Secondary | ICD-10-CM

## 2020-08-28 DIAGNOSIS — E162 Hypoglycemia, unspecified: Secondary | ICD-10-CM | POA: Diagnosis not present

## 2020-08-28 DIAGNOSIS — M542 Cervicalgia: Secondary | ICD-10-CM | POA: Diagnosis not present

## 2020-08-28 MED ORDER — DEXCOM G6 RECEIVER DEVI
1.0000 | Freq: Every day | 1 refills | Status: DC
  Filled 2020-08-28: qty 1, 1d supply, fill #0
  Filled 2020-10-08 – 2020-10-10 (×3): qty 1, 30d supply, fill #0
  Filled 2020-10-24: qty 1, 28d supply, fill #0
  Filled 2020-10-31: qty 1, 90d supply, fill #0

## 2020-08-28 MED ORDER — DEXCOM G6 TRANSMITTER MISC
1.0000 | Freq: Every day | 1 refills | Status: DC
  Filled 2020-08-28: qty 1, 1d supply, fill #0
  Filled 2020-10-08 – 2020-10-31 (×2): qty 1, 90d supply, fill #0

## 2020-08-28 MED ORDER — DEXCOM G6 SENSOR MISC
1.0000 | 2 refills | Status: DC
  Filled 2020-08-28: qty 3, 21d supply, fill #0
  Filled 2020-10-08 – 2020-10-31 (×2): qty 3, 30d supply, fill #0
  Filled 2021-01-16: qty 3, 21d supply, fill #0

## 2020-08-28 NOTE — Patient Instructions (Addendum)
Hypoglycemia Hypoglycemia is when the sugar (glucose) level in your blood is too low. Low blood sugar can happen to people who have diabetes and people who do not have diabetes. Low blood sugar can happen quickly, and it can be an emergency. What are the causes? This condition happens most often in people who have diabetes and may be caused by:  Diabetes medicine.  Not eating enough, or not eating often enough.  Doing more physical activity.  Drinking alcohol on an empty stomach. If you do not have diabetes, hypoglycemia may be caused by:  A tumor in the pancreas.  Not eating enough, or not eating for long periods at a time (fasting).  A very bad infection or illness.  Problems after having weight loss (bariatric) surgery.  Kidney failure or liver failure.  Certain medicines. What increases the risk? This condition is more likely to develop in people who:  Have diabetes and take medicines to lower their blood sugar.  Abuse alcohol.  Have a very bad illness. What are the signs or symptoms? Symptoms depend on whether your low blood sugar is mild, moderate, or very low. Mild  Hunger.  Feeling worried or nervous (anxious).  Sweating and feeling clammy.  Feeling dizzy or light-headed.  Being sleepy or having trouble sleeping.  Feeling like you may vomit (nauseous).  A fast heartbeat.  A headache.  Blurry vision.  Being irritable or grouchy.  Tingling or loss of feeling (numbness) around your mouth, lips, or tongue.  Trouble with moving (coordination). Moderate  Confusion and poor judgment.  Behavior changes.  Weakness.  Uneven heartbeats. Very low Very low blood sugar (severe hypoglycemia) is a medical emergency. It can cause:  Fainting.  Jerky movements that you cannot control (seizure).  Loss of consciousness (coma).  Death. How is this treated? Treating low blood sugar Low blood sugar is often treated by eating or drinking something  sugary right away. The snack should contain 15 grams of a fast-acting carb (carbohydrate). Options include:  4 oz (120 mL) of fruit juice.  4-6 oz (120-150 mL) of regular soda (not diet soda).  8 oz (240 mL) of low-fat milk.  Several pieces of hard candy. Check food labels to find out how many to eat for 15 grams.  1 Tbsp (15 mL) of sugar or honey. Treating low blood sugar if you have diabetes If you can think clearly and swallow safely, follow the 15:15 rule:  Take 15 grams of a fast-acting carb. Talk with your doctor about how much you should take.  Always keep a source of fast-acting carb with you, such as: ? Sugar tablets (glucose pills). Take 4 pills. ? Several pieces of hard candy. Check food labels to see how many pieces to eat for 15 grams. ? 4 oz (120 mL) of fruit juice. ? 4-6 oz (120-150 mL) of regular (not diet) soda. ? 1 Tbsp (15 mL) of honey or sugar.  Check your blood sugar 15 minutes after you take the carb.  If your blood sugar is still at or below 70 mg/dL (3.9 mmol/L), take 15 grams of a carb again.  If your blood sugar does not go above 70 mg/dL (3.9 mmol/L) after 3 tries, get help right away.  After your blood sugar goes back to normal, eat a meal or a snack within 1 hour.   Treating very low blood sugar If your blood sugar is at or below 54 mg/dL (3 mmol/L), you have very low blood sugar, or severe   hypoglycemia. This is an emergency. Get medical help right away. If you have very low blood sugar and you cannot eat or drink, you will need to be given a hormone called glucagon. A family member or friend should learn how to check your blood sugar and how to give you glucagon. Ask your doctor if you need to have an emergency glucagon kit at home. Very low blood sugar may also need to be treated in a hospital. Follow these instructions at home: General instructions  Take over-the-counter and prescription medicines only as told by your doctor.  Stay aware of your  blood sugar as told by your doctor.  If you drink alcohol: ? Limit how much you use to:  0-1 drink a day for nonpregnant women.  0-2 drinks a day for men. ? Be aware of how much alcohol is in your drink. In the [place] one drink equals one 12 oz bottle of beer (355 mL), one 5 oz glass of wine (148 mL), or one 1 oz glass of hard liquor (44 mL).  Keep all follow-up visits as told by your doctor. This is important. If you have diabetes:  Always have a rapid-acting carb (15 grams) option with you to treat low blood sugar.  Follow your diabetes care plan as told by your doctor. Make sure you: ? Know the symptoms of low blood sugar. ? Check your blood sugar as often as told by your doctor. Always check it before and after exercise. ? Always check your blood sugar before you drive. ? Take your medicines as told. ? Follow your meal plan. ? Eat on time. Do not skip meals.  Share your diabetes care plan with: ? Your work or school. ? People you live with.  Carry a card or wear jewelry that says you have diabetes.   Contact a doctor if:  You have trouble keeping your blood sugar in your target range.  You have low blood sugar often. Get help right away if:  You still have symptoms after you eat or drink something that contains 15 grams of fast-acting carb and you cannot get your blood sugar above 70 mg/dL by following the 15:15 rule.  Your blood sugar is at or below 54 mg/dL (3 mmol/L).  You have a seizure.  You faint. These symptoms may be an emergency. Do not wait to see if the symptoms will go away. Get medical help right away. Call your local emergency services (911 in the [place] Do not drive yourself to the hospital. Summary  Hypoglycemia happens when the level of sugar (glucose) in your blood is too low.  Low blood sugar can happen to people who have diabetes and people who do not have diabetes. Low blood sugar can happen quickly, and it can be an emergency.  Make sure you  know the symptoms of low blood sugar and know how to treat it.  Always keep a source of sugar (fast-acting carb) with you to treat low blood sugar. This information is not intended to replace advice given to you by your health care provider. Make sure you discuss any questions you have with your health care provider. Document Revised: 02/09/2019 Document Reviewed: 02/09/2019 Elsevier Patient Education  2021 [place] Maintenance, Female Adopting a healthy lifestyle and getting preventive care are important in promoting health and wellness. Ask your health care provider about:  The right schedule for you to have regular tests and exams.  Things you can do on your own  to prevent diseases and keep yourself healthy. What should I know about diet, weight, and exercise? Eat a healthy diet  Eat a diet that includes plenty of vegetables, fruits, low-fat dairy products, and lean protein.  Do not eat a lot of foods that are high in solid fats, added sugars, or sodium.   Maintain a healthy weight Body mass index (BMI) is used to identify weight problems. It estimates body fat based on height and weight. Your health care provider can help determine your BMI and help you achieve or maintain a healthy weight. Get regular exercise Get regular exercise. This is one of the most important things you can do for your health. Most adults should:  Exercise for at least 150 minutes each week. The exercise should increase your heart rate and make you sweat (moderate-intensity exercise).  Do strengthening exercises at least twice a week. This is in addition to the moderate-intensity exercise.  Spend less time sitting. Even light physical activity can be beneficial. Watch cholesterol and blood lipids Have your blood tested for lipids and cholesterol at 50 years of age, then have this test every 5 years. Have your cholesterol levels checked more often if:  Your lipid or cholesterol levels are  high.  You are older than 50 years of age.  You are at high risk for heart disease. What should I know about cancer screening? Depending on your health history and family history, you may need to have cancer screening at various ages. This may include screening for:  Breast cancer.  Cervical cancer.  Colorectal cancer.  Skin cancer.  Lung cancer. What should I know about heart disease, diabetes, and high blood pressure? Blood pressure and heart disease  High blood pressure causes heart disease and increases the risk of stroke. This is more likely to develop in people who have high blood pressure readings, are of African descent, or are overweight.  Have your blood pressure checked: ? Every 3-5 years if you are 31-53 years of age. ? Every year if you are 58 years old or older. Diabetes Have regular diabetes screenings. This checks your fasting blood sugar level. Have the screening done:  Once every three years after age 21 if you are at a normal weight and have a low risk for diabetes.  More often and at a younger age if you are overweight or have a high risk for diabetes. What should I know about preventing infection? Hepatitis B If you have a higher risk for hepatitis B, you should be screened for this virus. Talk with your health care provider to find out if you are at risk for hepatitis B infection. Hepatitis C Testing is recommended for:  Everyone born from [birth year] through 1965.  Anyone with known risk factors for hepatitis C. Sexually transmitted infections (STIs)  Get screened for STIs, including gonorrhea and chlamydia, if: ? You are sexually active and are younger than 50 years of age. ? You are older than 50 years of age and your health care provider tells you that you are at risk for this type of infection. ? Your sexual activity has changed since you were last screened, and you are at increased risk for chlamydia or gonorrhea. Ask your health care provider if you  are at risk.  Ask your health care provider about whether you are at high risk for HIV. Your health care provider may recommend a prescription medicine to help prevent HIV infection. If you choose to take medicine to prevent HIV,  you should first get tested for HIV. You should then be tested every 3 months for as long as you are taking the medicine. Pregnancy  If you are about to stop having your period (premenopausal) and you may become pregnant, seek counseling before you get pregnant.  Take 400 to 800 micrograms (mcg) of folic acid every day if you become pregnant.  Ask for birth control (contraception) if you want to prevent pregnancy. Osteoporosis and menopause Osteoporosis is a disease in which the bones lose minerals and strength with aging. This can result in bone fractures. If you are 17 years old or older, or if you are at risk for osteoporosis and fractures, ask your health care provider if you should:  Be screened for bone loss.  Take a calcium or vitamin D supplement to lower your risk of fractures.  Be given hormone replacement therapy (HRT) to treat symptoms of menopause. Follow these instructions at home: Lifestyle  Do not use any products that contain nicotine or tobacco, such as cigarettes, e-cigarettes, and chewing tobacco. If you need help quitting, ask your health care provider.  Do not use street drugs.  Do not share needles.  Ask your health care provider for help if you need support or information about quitting drugs. Alcohol use  Do not drink alcohol if: ? Your health care provider tells you not to drink. ? You are pregnant, may be pregnant, or are planning to become pregnant.  If you drink alcohol: ? Limit how much you use to 0-1 drink a day. ? Limit intake if you are breastfeeding.  Be aware of how much alcohol is in your drink. In the [place] one drink equals one 12 oz bottle of beer (355 mL), one 5 oz glass of wine (148 mL), or one 1 oz glass of hard  liquor (44 mL). General instructions  Schedule regular health, dental, and eye exams.  Stay current with your vaccines.  Tell your health care provider if: ? You often feel depressed. ? You have ever been abused or do not feel safe at home. Summary  Adopting a healthy lifestyle and getting preventive care are important in promoting health and wellness.  Follow your health care provider's instructions about healthy diet, exercising, and getting tested or screened for diseases.  Follow your health care provider's instructions on monitoring your cholesterol and blood pressure. This information is not intended to replace advice given to you by your health care provider. Make sure you discuss any questions you have with your health care provider. Document Revised: 03/10/2018 Document Reviewed: 03/10/2018 Elsevier Patient Education  2021 [employer].

## 2020-08-28 NOTE — Progress Notes (Signed)
Subjective:     Jennifer Erickson is a 50 y.o. female and is here for a comprehensive physical exam. The patient reports problems - pt has had some low glucose readings with freestyle mainly at night. she wonders if they are correct. she is asymptomatic. she wants to try dexcom.    She has had large breast since teenager. For last 10 year upper back and shoulder pain worsening. Large breast make it hard to exercise. She is 36 M. She uses massage, ibuprofen, exercises to help with pain.   Social History   Socioeconomic History  . Marital status: Widowed    Spouse name: Not on file  . Number of children: Not on file  . Years of education: Not on file  . Highest education level: Not on file  Occupational History  . Not on file  Tobacco Use  . Smoking status: Never Smoker  . Smokeless tobacco: Never Used  Vaping Use  . Vaping Use: Never used  Substance and Sexual Activity  . Alcohol use: No  . Drug use: No  . Sexual activity: Not Currently    Birth control/protection: Pill    Comment: 1st intercourse- 19, partners-1, widow  Other Topics Concern  . Not on file  Social History Narrative  . Not on file   Social Determinants of Health   Financial Resource Strain: Not on file  Food Insecurity: Not on file  Transportation Needs: Not on file  Physical Activity: Not on file  Stress: Not on file  Social Connections: Not on file  Intimate Partner Violence: Not on file   Health Maintenance  Topic Date Due  . Pneumococcal Vaccine 60-75 Years old (1 of 4 - PCV13) Never done  . Hepatitis C Screening  Never done  . HIV Screening  07/01/2026 (Originally 11/11/1985)  . INFLUENZA VACCINE  10/29/2020  . Zoster Vaccines- Shingrix (1 of 2) 11/11/2020  . MAMMOGRAM  08/17/2021  . PAP SMEAR-Modifier  08/18/2023  . TETANUS/TDAP  10/28/2028  . COVID-19 Vaccine  Completed  . HPV VACCINES  Aged Out    The following portions of the patient's history were reviewed and updated as appropriate:  allergies, current medications, past family history, past medical history, past social history, past surgical history and problem list.  Review of Systems Pertinent items noted in HPI and remainder of comprehensive ROS otherwise negative.   Objective:    BP 131/71   Pulse 85   Ht 5' 5"  (1.651 m)   Wt 287 lb (130.2 kg)   SpO2 99%   BMI 47.76 kg/m  General appearance: alert, cooperative, appears stated age and morbidly obese Head: Normocephalic, without obvious abnormality, atraumatic Eyes: conjunctivae/corneas clear. PERRL, EOM's intact. Fundi benign. Ears: normal TM's and external ear canals both ears Nose: Nares normal. Septum midline. Mucosa normal. No drainage or sinus tenderness. Throat: lips, mucosa, and tongue normal; teeth and gums normal Neck: no adenopathy, no carotid bruit, no JVD, supple, symmetrical, trachea midline and thyroid not enlarged, symmetric, no tenderness/mass/nodules Back: symmetric, no curvature. ROM normal. No CVA tenderness. Lungs: clear to auscultation bilaterally and normal percussion bilaterally Breasts: normal appearance, no masses or tenderness LARGE bilateral breast.  Heart: regular rate and rhythm, S1, S2 normal, no murmur, click, rub or gallop Abdomen: soft, non-tender; bowel sounds normal; no masses,  no organomegaly Extremities: extremities normal, atraumatic, no cyanosis or edema Pulses: 2+ and symmetric Skin: Skin color, texture, turgor normal. No rashes or lesions Lymph nodes: Cervical, supraclavicular, and axillary nodes normal.  Neurologic: Alert and oriented X 3, normal strength and tone. Normal symmetric reflexes. Normal coordination and gait   .[name] Depression screen [facility] 2/9 08/28/2020 01/04/2020 05/12/2018 07/10/2017 01/21/2017  Decreased Interest 0 0 0 0 0  Down, Depressed, Hopeless 0 0 0 0 0  PHQ - 2 Score 0 0 0 0 0  Altered sleeping - - - - 1  Tired, decreased energy - - - - 1  Change in appetite - - - - 0  Feeling bad or failure about  yourself  - - - - 0  Trouble concentrating - - - - 0  Moving slowly or fidgety/restless - - - - 0  Suicidal thoughts - - - - 0  PHQ-9 Score - - - - 2  Difficult doing work/chores - - - - Not difficult at all   .[name] GAD 7 : Generalized Anxiety Score 05/12/2018 01/21/2017  Nervous, Anxious, on Edge 0 0  Control/stop worrying 0 0  Worry too much - different things 0 0  Trouble relaxing 0 1  Restless 0 0  Easily annoyed or irritable 0 1  Afraid - awful might happen 0 0  Total GAD 7 Score 0 2  Anxiety Difficulty Not difficult at all Not difficult at all     Assessment:    Healthy female exam.      Plan:    Marland KitchenMarland KitchenAileen was seen today for annual exam.  Diagnoses and all orders for this visit:  Annual physical exam  Hypoglycemia -     Continuous Blood Gluc Receiver ([place]) [place]; Use as directed. -     Continuous Blood Gluc Sensor (DEXCOM G6 SENSOR) MISC; Use  as directed once weekly. -     Continuous Blood Gluc Transmit (DEXCOM G6 TRANSMITTER) MISC; Use as directed daily. -     Insulin and C-Peptide -     Hepatitis C Antibody  Large breasts -     Ambulatory referral to Plastic Surgery  Neck pain -     Ambulatory referral to Plastic Surgery  Upper back pain -     Ambulatory referral to Plastic Surgery   .[name] Discussed 150 minutes of exercise a week.  Encouraged vitamin D 1000 units and Calcium 1361m or 4 servings of dairy a day.  PHQ/GAD WNL. Fasting labs ordered.  Pap/mammogram UTD.  cologuard UTD.   Discussed hypoglycemia and symptoms. dexcom to tract sugars more accurately. Reminder to eat small frequent meals.   Would like to consider breast reduction. She has symptoms from large breast but they also keep her from being active and losing weight. Made referral.   See After Visit Summary for Counseling Recommendations

## 2020-08-29 ENCOUNTER — Other Ambulatory Visit (HOSPITAL_BASED_OUTPATIENT_CLINIC_OR_DEPARTMENT_OTHER): Payer: Self-pay

## 2020-08-31 ENCOUNTER — Other Ambulatory Visit (HOSPITAL_BASED_OUTPATIENT_CLINIC_OR_DEPARTMENT_OTHER): Payer: Self-pay

## 2020-09-03 ENCOUNTER — Other Ambulatory Visit (HOSPITAL_BASED_OUTPATIENT_CLINIC_OR_DEPARTMENT_OTHER): Payer: Self-pay

## 2020-09-03 ENCOUNTER — Encounter: Payer: Self-pay | Admitting: Physician Assistant

## 2020-09-04 ENCOUNTER — Other Ambulatory Visit (HOSPITAL_BASED_OUTPATIENT_CLINIC_OR_DEPARTMENT_OTHER): Payer: Self-pay

## 2020-09-05 ENCOUNTER — Other Ambulatory Visit (HOSPITAL_BASED_OUTPATIENT_CLINIC_OR_DEPARTMENT_OTHER): Payer: Self-pay

## 2020-09-07 ENCOUNTER — Other Ambulatory Visit (HOSPITAL_BASED_OUTPATIENT_CLINIC_OR_DEPARTMENT_OTHER): Payer: Self-pay

## 2020-09-10 ENCOUNTER — Other Ambulatory Visit (HOSPITAL_BASED_OUTPATIENT_CLINIC_OR_DEPARTMENT_OTHER): Payer: Self-pay

## 2020-09-11 ENCOUNTER — Other Ambulatory Visit (HOSPITAL_BASED_OUTPATIENT_CLINIC_OR_DEPARTMENT_OTHER): Payer: Self-pay

## 2020-09-12 ENCOUNTER — Other Ambulatory Visit (HOSPITAL_BASED_OUTPATIENT_CLINIC_OR_DEPARTMENT_OTHER): Payer: Self-pay

## 2020-09-13 ENCOUNTER — Other Ambulatory Visit (HOSPITAL_BASED_OUTPATIENT_CLINIC_OR_DEPARTMENT_OTHER): Payer: Self-pay

## 2020-09-13 DIAGNOSIS — Z13220 Encounter for screening for lipoid disorders: Secondary | ICD-10-CM | POA: Diagnosis not present

## 2020-09-13 DIAGNOSIS — E559 Vitamin D deficiency, unspecified: Secondary | ICD-10-CM | POA: Diagnosis not present

## 2020-09-13 DIAGNOSIS — E162 Hypoglycemia, unspecified: Secondary | ICD-10-CM | POA: Diagnosis not present

## 2020-09-13 DIAGNOSIS — Z131 Encounter for screening for diabetes mellitus: Secondary | ICD-10-CM | POA: Diagnosis not present

## 2020-09-14 LAB — LIPID PANEL W/REFLEX DIRECT LDL
Cholesterol: 180 mg/dL (ref <200)
HDL: 56 mg/dL (ref >=50)
LDL Cholesterol (Calc): 107 mg/dL (calc) — ABNORMAL HIGH
Non-HDL Cholesterol (Calc): 124 mg/dL (calc) (ref <130)
Total CHOL/HDL Ratio: 3.2 (calc) (ref <5.0)
Triglycerides: 80 mg/dL (ref <150)

## 2020-09-14 LAB — HEPATITIS C ANTIBODY
Hepatitis C Ab: NONREACTIVE
SIGNAL TO CUT-OFF: 0.01 (ref <1.00)

## 2020-09-14 LAB — HEMOGLOBIN A1C
Hgb A1c MFr Bld: 6.2 % of total Hgb — ABNORMAL HIGH (ref <5.7)
Mean Plasma Glucose: 131 mg/dL
eAG (mmol/L): 7.3 mmol/L

## 2020-09-14 LAB — VITAMIN D 25 HYDROXY (VIT D DEFICIENCY, FRACTURES): Vit D, 25-Hydroxy: 54 ng/mL (ref 30–100)

## 2020-09-14 NOTE — Progress Notes (Signed)
[name],   HDL, good cholesterol, great.  LDL, bad cholesterol, stable and not optimal but still looks really good pending you don't develop diabetes. A1C still in pre-diabetes today but rising some. Recheck in 6 months. 6.5 or greater is diabetes you are 6.2.

## 2020-09-15 LAB — INSULIN AND C-PEPTIDE, SERUM
C-Peptide: 3.6 ng/mL (ref 1.1–4.4)
INSULIN: 35.1 u[IU]/mL — ABNORMAL HIGH (ref 2.6–24.9)

## 2020-09-17 ENCOUNTER — Encounter: Payer: Self-pay | Admitting: Physician Assistant

## 2020-09-17 DIAGNOSIS — R7303 Prediabetes: Secondary | ICD-10-CM | POA: Insufficient documentation

## 2020-09-17 NOTE — Progress Notes (Signed)
Added labs show insulin as suspected in blood to help lower blood glucose levels and C-peptide normal this means a more classic type II diabetes issue is more likely in the future. Pancreas appears to be working fine. Continue to monitor if you are having more hypoglycemic symptoms and let me know.

## 2020-09-21 DIAGNOSIS — Z79899 Other long term (current) drug therapy: Secondary | ICD-10-CM | POA: Diagnosis not present

## 2020-09-27 LAB — HM DIABETES EYE EXAM

## 2020-10-08 ENCOUNTER — Other Ambulatory Visit: Payer: Self-pay | Admitting: Sports Medicine

## 2020-10-08 ENCOUNTER — Other Ambulatory Visit (HOSPITAL_BASED_OUTPATIENT_CLINIC_OR_DEPARTMENT_OTHER): Payer: Self-pay

## 2020-10-08 ENCOUNTER — Encounter: Payer: Self-pay | Admitting: Physician Assistant

## 2020-10-08 MED ORDER — FREESTYLE LIBRE 14 DAY SENSOR MISC
11 refills | Status: DC
  Filled 2020-10-08: qty 2, 28d supply, fill #0
  Filled 2020-10-31 (×2): qty 2, 28d supply, fill #1
  Filled 2020-12-14 – 2020-12-24 (×2): qty 2, 28d supply, fill #2
  Filled 2021-01-16: qty 2, 28d supply, fill #3
  Filled 2021-02-13 – 2021-02-28 (×2): qty 2, 28d supply, fill #4
  Filled 2021-03-20 – 2021-03-21 (×2): qty 2, 28d supply, fill #5
  Filled 2021-04-22: qty 2, 28d supply, fill #6
  Filled 2021-05-22: qty 2, 28d supply, fill #7
  Filled 2021-07-02: qty 2, 28d supply, fill #8
  Filled 2021-08-01: qty 2, 28d supply, fill #9
  Filled 2021-09-19: qty 2, 28d supply, fill #10

## 2020-10-08 MED FILL — Epinephrine Solution Auto-injector 0.3 MG/0.3ML (1:1000): INTRAMUSCULAR | 2 days supply | Qty: 2 | Fill #0 | Status: AC

## 2020-10-09 ENCOUNTER — Other Ambulatory Visit: Payer: Self-pay

## 2020-10-09 ENCOUNTER — Other Ambulatory Visit (HOSPITAL_BASED_OUTPATIENT_CLINIC_OR_DEPARTMENT_OTHER): Payer: Self-pay

## 2020-10-10 ENCOUNTER — Other Ambulatory Visit (HOSPITAL_BASED_OUTPATIENT_CLINIC_OR_DEPARTMENT_OTHER): Payer: Self-pay

## 2020-10-17 ENCOUNTER — Telehealth: Payer: Self-pay | Admitting: Neurology

## 2020-10-17 NOTE — Telephone Encounter (Signed)
Prior Authorization for Dexcom submitted via covermymeds. Awaiting response. Your information has been submitted to [place]. To check for an updated outcome later, reopen this PA request from your dashboard.  If [employer] has not responded to your request within 24 hours, contact [place] at [phone]. If you think there may be a problem with your PA request, use our live chat feature at the bottom right.

## 2020-10-19 NOTE — Telephone Encounter (Signed)
Dexcom auth denied.  "The indicated use of this medication, as provided does not meet the [facility] service benefit plan's criteria due to the diagnosis is not covered"

## 2020-10-22 ENCOUNTER — Ambulatory Visit: Payer: Federal, State, Local not specified - PPO | Admitting: Sports Medicine

## 2020-10-24 ENCOUNTER — Other Ambulatory Visit (HOSPITAL_BASED_OUTPATIENT_CLINIC_OR_DEPARTMENT_OTHER): Payer: Self-pay

## 2020-10-31 ENCOUNTER — Other Ambulatory Visit (HOSPITAL_BASED_OUTPATIENT_CLINIC_OR_DEPARTMENT_OTHER): Payer: Self-pay

## 2020-11-19 ENCOUNTER — Other Ambulatory Visit (HOSPITAL_BASED_OUTPATIENT_CLINIC_OR_DEPARTMENT_OTHER): Payer: Self-pay

## 2020-11-27 ENCOUNTER — Institutional Professional Consult (permissible substitution): Payer: Federal, State, Local not specified - PPO | Admitting: Plastic Surgery

## 2020-12-14 ENCOUNTER — Other Ambulatory Visit (HOSPITAL_BASED_OUTPATIENT_CLINIC_OR_DEPARTMENT_OTHER): Payer: Self-pay

## 2020-12-18 ENCOUNTER — Other Ambulatory Visit (HOSPITAL_BASED_OUTPATIENT_CLINIC_OR_DEPARTMENT_OTHER): Payer: Self-pay

## 2020-12-19 ENCOUNTER — Encounter: Payer: Self-pay | Admitting: Osteopathic Medicine

## 2020-12-19 ENCOUNTER — Other Ambulatory Visit (HOSPITAL_BASED_OUTPATIENT_CLINIC_OR_DEPARTMENT_OTHER): Payer: Self-pay

## 2020-12-19 ENCOUNTER — Telehealth (INDEPENDENT_AMBULATORY_CARE_PROVIDER_SITE_OTHER): Payer: Federal, State, Local not specified - PPO | Admitting: Osteopathic Medicine

## 2020-12-19 DIAGNOSIS — R0981 Nasal congestion: Secondary | ICD-10-CM

## 2020-12-19 MED ORDER — CEPHALEXIN 500 MG PO CAPS
500.0000 mg | ORAL_CAPSULE | Freq: Three times a day (TID) | ORAL | 0 refills | Status: AC
  Filled 2020-12-19: qty 21, 7d supply, fill #0

## 2020-12-19 MED ORDER — IPRATROPIUM BROMIDE 0.06 % NA SOLN
2.0000 | Freq: Four times a day (QID) | NASAL | 1 refills | Status: DC
  Filled 2020-12-19: qty 15, 10d supply, fill #0

## 2020-12-19 MED ORDER — PREDNISONE 20 MG PO TABS
20.0000 mg | ORAL_TABLET | Freq: Two times a day (BID) | ORAL | 0 refills | Status: DC
  Filled 2020-12-19: qty 10, 5d supply, fill #0

## 2020-12-19 NOTE — Patient Instructions (Addendum)
Suspect allergies or mild viral infection Trial treatment w/ symptom relief (OTC meds below would definitely take Benadryl and Sudafed, prednisone steroids for possible bad allergies, ipratropium nasal spray for mucus/congestion) If the above meds aren't helping by Saturday would take the antibiotics for possible sinus infection (cephalexin)    Medications & Home Remedies for Upper Respiratory Illness   Note: the following list assumes no pregnancy, normal liver & kidney function and no other drug interactions. Dr. Sheppard Coil has highlighted medications which are safe for you to use, but these may not be appropriate for everyone. Always ask a pharmacist or qualified medical provider if you have any questions!    Aches/Pains, Fever, Headache OTC Acetaminophen (Tylenol) 500 mg tablets - take max 2 tablets (1000 mg) every 6 hours (4 times per day)  OTC Ibuprofen (Motrin) 200 mg tablets - take max 4 tablets (800 mg) every 6 hours*   Sinus Congestion Prescription Atrovent as directed OTC Nasal Saline if desired to rinse OTC Oxymetolazone (Afrin, others) sparing use due to rebound congestion, NEVER use in kids OTC Phenylephrine (Sudafed) 10 mg tablets every 4 hours (or the 12-hour formulation)* OTC Diphenhydramine (Benadryl) 25 mg tablets - take max 2 tablets every 4 hours   Cough & Sore Throat OTC Dextromethorphan (Robitussin, others) - cough suppressant OTC Guaifenesin (Robitussin, Mucinex, others) - expectorant (helps cough up mucus) (Dextromethorphan and Guaifenesin also come in a combination tablet/syrup) OTC Lozenges w/ Benzocaine + Menthol (Cepacol) Honey - as much as you want! Teas which "coat the throat" - look for ingredients Elm Bark, Licorice Root, Marshmallow Root   Other Prescription Oral Steroids to decrease inflammation / allergies  Prescription Antibiotics if these are necessary for bacterial infection - take ALL, even if you're feeling better  OTC Zinc Lozenges within 24  hours of symptoms onset - mixed evidence this shortens the duration of the common cold Don't waste your money on Vitamin C or Echinacea in acute illness - it's already too late!    *Caution in patients with high blood pressure

## 2020-12-19 NOTE — Progress Notes (Signed)
Telemedicine Visit via  Video & Audio (App used: [email or web address])   I connected with Jennifer Erickson on 12/19/20 at 10:32 AM  by phone or  telemedicine application as noted above  I verified that I am speaking with or regarding  the correct patient using two identifiers.  Participants: Myself, Dr Emeterio Reeve DO Patient: Jennifer Erickson Patient proxy if applicable: none Other, if applicable: none  Patient is at home I am in office at [facility]    I discussed the limitations of evaluation and management  by telemedicine and the availability of in person appointments.  The participant(s) above expressed understanding and  agreed to proceed with this appointment via telemedicine.       History of Present Illness: Jennifer Erickson is a 50 y.o. female who would like to discuss head cold / congestion, headache and sinus pressure w/ mucus, minimal cough, taken Sudafed. Has allergies, this seems a little worse than usual seasonal allergies.        Observations/Objective: There were no vitals taken for this visit. BP Readings from Last 3 Encounters:  08/28/20 131/71  08/24/20 121/78  08/17/20 138/80   Exam: Normal Speech.  NAD  Lab and Radiology Results No results found for this or any previous visit (from the past 72 hour(s)). No results found.     Assessment and Plan: 50 y.o. female with The encounter diagnosis was Sinus congestion more likely mild viral URI vs allergies, possible bacterial sinusitis.   PDMP not reviewed this encounter. No orders of the defined types were placed in this encounter.  Meds ordered this encounter  Medications   ipratropium (ATROVENT) 0.06 % nasal spray    Sig: Place 2 sprays into both nostrils 4 (four) times daily. As needed for runny nose / postnasal drip    Dispense:  15 mL    Refill:  1   predniSONE (DELTASONE) 20 MG tablet    Sig: Take 1 tablet (20 mg total) by mouth 2 (two) times daily with a  meal.    Dispense:  10 tablet    Refill:  0   cephALEXin (KEFLEX) 500 MG capsule    Sig: Take 1 capsule (500 mg total) by mouth 3 (three) times daily for 7 days.    Dispense:  21 capsule    Refill:  0   Patient Instructions  Suspect allergies or mild viral infection Trial treatment w/ symptom relief (OTC meds below would definitely take Benadryl and Sudafed, prednisone steroids for possible bad allergies, ipratropium nasal spray for mucus/congestion) If the above meds aren't helping by Saturday would take the antibiotics for possible sinus infection (cephalexin)    Medications & Home Remedies for Upper Respiratory Illness   Note: the following list assumes no pregnancy, normal liver & kidney function and no other drug interactions. Dr. Sheppard Coil has highlighted medications which are safe for you to use, but these may not be appropriate for everyone. Always ask a pharmacist or qualified medical provider if you have any questions!    Aches/Pains, Fever, Headache OTC Acetaminophen (Tylenol) 500 mg tablets - take max 2 tablets (1000 mg) every 6 hours (4 times per day)  OTC Ibuprofen (Motrin) 200 mg tablets - take max 4 tablets (800 mg) every 6 hours*   Sinus Congestion Prescription Atrovent as directed OTC Nasal Saline if desired to rinse OTC Oxymetolazone (Afrin, others) sparing use due to rebound congestion, NEVER use in kids OTC Phenylephrine (Sudafed) 10 mg tablets every 4 hours (or the  12-hour formulation)* OTC Diphenhydramine (Benadryl) 25 mg tablets - take max 2 tablets every 4 hours   Cough & Sore Throat OTC Dextromethorphan (Robitussin, others) - cough suppressant OTC Guaifenesin (Robitussin, Mucinex, others) - expectorant (helps cough up mucus) (Dextromethorphan and Guaifenesin also come in a combination tablet/syrup) OTC Lozenges w/ Benzocaine + Menthol (Cepacol) Honey - as much as you want! Teas which "coat the throat" - look for ingredients Elm Bark, Licorice Root,  Marshmallow Root   Other Prescription Oral Steroids to decrease inflammation / allergies  Prescription Antibiotics if these are necessary for bacterial infection - take ALL, even if you're feeling better  OTC Zinc Lozenges within 24 hours of symptoms onset - mixed evidence this shortens the duration of the common cold Don't waste your money on Vitamin C or Echinacea in acute illness - it's already too late!    *Caution in patients with high blood pressure    Instructions sent via MyChart.   Follow Up Instructions: Return if symptoms worsen or fail to improve.    I discussed the assessment and treatment plan with the patient. The patient was provided an opportunity to ask questions and all were answered. The patient agreed with the plan and demonstrated an understanding of the instructions.   The patient was advised to call back or seek an in-person evaluation if any new concerns, if symptoms worsen or if the condition fails to improve as anticipated.  20 minutes of non-face-to-face time was provided during this encounter.      . . . . . . . . . . . . . [name]                   Historical information moved to improve visibility of documentation.  Past Medical History:  Diagnosis Date   Lupus ([place])    Personal history of amenorrhea    Sinusitis    Past Surgical History:  Procedure Laterality Date   DILATION AND CURETTAGE OF UTERUS     TONSILLECTOMY     Social History   Tobacco Use   Smoking status: Never   Smokeless tobacco: Never  Substance Use Topics   Alcohol use: No   family history includes Cancer in her mother; Diabetes in her brother; Hypertension in her father.  Medications: Current Outpatient Medications  Medication Sig Dispense Refill   cephALEXin (KEFLEX) 500 MG capsule Take 1 capsule (500 mg total) by mouth 3 (three) times daily for 7 days. 21 capsule 0   Continuous Blood Gluc Receiver (DEXCOM G6 RECEIVER) DEVI Use as directed.  1 each 1   Continuous Blood Gluc Sensor (DEXCOM G6 SENSOR) MISC Use  as directed once weekly. 3 each 2   Continuous Blood Gluc Sensor (FREESTYLE LIBRE 14 DAY SENSOR) MISC APPLY TO UPPER DELTOID EVERY 14 DAYS. USE WITH READER TO DETERMINE BLOOD SUGARS 2 each 11   Continuous Blood Gluc Transmit (DEXCOM G6 TRANSMITTER) MISC Use as directed daily. 1 each 1   EPINEPHrine 0.3 mg/0.3 mL IJ SOAJ injection INJECT 0.3 MLS (0.3 MG TOTAL) INTO THE MUSCLE ONCE. 2 each 1   etonogestrel (IMPLANON) 68 MG IMPL implant Inject 1 each into the skin once.     hydroxychloroquine (PLAQUENIL) 200 MG tablet TAKE 1 TABLET BY MOUTH TWICE DAILY WITH FOOD OR MILK 180 tablet 1   ipratropium (ATROVENT) 0.06 % nasal spray Place 2 sprays into both nostrils 4 (four) times daily. As needed for runny nose / postnasal drip 15 mL 1   Multiple  Vitamins-Minerals (ZINC PO) Take by mouth daily.     predniSONE (DELTASONE) 20 MG tablet Take 1 tablet (20 mg total) by mouth 2 (two) times daily with a meal. 10 tablet 0   VITAMIN D PO Take by mouth.     Maca Root (MACA PO) Take by mouth.     No current facility-administered medications for this visit.   Allergies  Allergen Reactions   Aspirin Nausea And Vomiting and Other (See Comments)   Augmentin [Amoxicillin-Pot Clavulanate] Diarrhea   Ciprofloxacin Nausea And Vomiting   Hydrocodone Nausea Only    Cough syrup   Ivp Dye [Iodinated Diagnostic Agents]      If phone visit, billing and coding can please add appropriate modifier if needed

## 2020-12-24 ENCOUNTER — Other Ambulatory Visit (HOSPITAL_BASED_OUTPATIENT_CLINIC_OR_DEPARTMENT_OTHER): Payer: Self-pay

## 2021-01-01 ENCOUNTER — Ambulatory Visit: Payer: Federal, State, Local not specified - PPO | Attending: Internal Medicine

## 2021-01-01 ENCOUNTER — Other Ambulatory Visit (HOSPITAL_BASED_OUTPATIENT_CLINIC_OR_DEPARTMENT_OTHER): Payer: Self-pay

## 2021-01-01 DIAGNOSIS — Z23 Encounter for immunization: Secondary | ICD-10-CM

## 2021-01-01 MED ORDER — INFLUENZA VAC SPLIT QUAD 0.5 ML IM SUSY
PREFILLED_SYRINGE | INTRAMUSCULAR | 0 refills | Status: DC
  Filled 2021-01-01: qty 0.5, 1d supply, fill #0

## 2021-01-01 NOTE — Progress Notes (Signed)
   Covid-19 Vaccination Clinic  Name:  Jennifer Erickson    MRN: [id number] DOB: 02/17/1971  01/01/2021  [name] was observed post Covid-19 immunization for 15 minutes without incident. She was provided with Vaccine Information Sheet and instruction to access the V-Safe system.   [name] was instructed to call 911 with any severe reactions post vaccine: Difficulty breathing  Swelling of face and throat  A fast heartbeat  A bad rash all over body  Dizziness and weakness

## 2021-01-11 ENCOUNTER — Other Ambulatory Visit (HOSPITAL_BASED_OUTPATIENT_CLINIC_OR_DEPARTMENT_OTHER): Payer: Self-pay

## 2021-01-11 MED ORDER — COVID-19MRNA BIVAL VACC PFIZER 30 MCG/0.3ML IM SUSP
INTRAMUSCULAR | 0 refills | Status: DC
Start: 2021-01-01 — End: 2021-04-23
  Filled 2021-01-11: qty 0.3, 1d supply, fill #0

## 2021-01-16 ENCOUNTER — Other Ambulatory Visit (HOSPITAL_BASED_OUTPATIENT_CLINIC_OR_DEPARTMENT_OTHER): Payer: Self-pay

## 2021-01-17 ENCOUNTER — Other Ambulatory Visit (HOSPITAL_BASED_OUTPATIENT_CLINIC_OR_DEPARTMENT_OTHER): Payer: Self-pay

## 2021-01-17 DIAGNOSIS — L930 Discoid lupus erythematosus: Secondary | ICD-10-CM | POA: Diagnosis not present

## 2021-01-17 DIAGNOSIS — Z79899 Other long term (current) drug therapy: Secondary | ICD-10-CM | POA: Diagnosis not present

## 2021-01-17 DIAGNOSIS — G4733 Obstructive sleep apnea (adult) (pediatric): Secondary | ICD-10-CM | POA: Diagnosis not present

## 2021-01-17 MED ORDER — HYDROXYCHLOROQUINE SULFATE 200 MG PO TABS
200.0000 mg | ORAL_TABLET | Freq: Two times a day (BID) | ORAL | 3 refills | Status: DC
Start: 2021-01-17 — End: 2021-10-20
  Filled 2021-01-17 – 2021-03-20 (×3): qty 180, 90d supply, fill #0
  Filled 2021-10-17: qty 180, 90d supply, fill #1

## 2021-01-21 ENCOUNTER — Other Ambulatory Visit (HOSPITAL_BASED_OUTPATIENT_CLINIC_OR_DEPARTMENT_OTHER): Payer: Self-pay

## 2021-02-13 ENCOUNTER — Other Ambulatory Visit (HOSPITAL_BASED_OUTPATIENT_CLINIC_OR_DEPARTMENT_OTHER): Payer: Self-pay

## 2021-02-22 ENCOUNTER — Other Ambulatory Visit (HOSPITAL_BASED_OUTPATIENT_CLINIC_OR_DEPARTMENT_OTHER): Payer: Self-pay

## 2021-02-28 ENCOUNTER — Other Ambulatory Visit (HOSPITAL_BASED_OUTPATIENT_CLINIC_OR_DEPARTMENT_OTHER): Payer: Self-pay

## 2021-03-12 ENCOUNTER — Institutional Professional Consult (permissible substitution): Payer: Federal, State, Local not specified - PPO | Admitting: Plastic Surgery

## 2021-03-20 ENCOUNTER — Other Ambulatory Visit (HOSPITAL_BASED_OUTPATIENT_CLINIC_OR_DEPARTMENT_OTHER): Payer: Self-pay

## 2021-03-20 ENCOUNTER — Emergency Department
Admission: RE | Admit: 2021-03-20 | Discharge: 2021-03-20 | Disposition: A | Discharge status: Home / Self Care | Payer: Federal, State, Local not specified - PPO | Source: Ambulatory Visit

## 2021-03-20 ENCOUNTER — Other Ambulatory Visit: Payer: Self-pay

## 2021-03-20 VITALS — BP 132/86 | HR 96 | Temp 98.2°F | Resp 17

## 2021-03-20 DIAGNOSIS — R350 Frequency of micturition: Secondary | ICD-10-CM

## 2021-03-20 DIAGNOSIS — N3001 Acute cystitis with hematuria: Secondary | ICD-10-CM

## 2021-03-20 LAB — POCT URINALYSIS DIP (MANUAL ENTRY)
Bilirubin, UA: NEGATIVE
Glucose, UA: NEGATIVE mg/dL
Nitrite, UA: POSITIVE — AB
Protein Ur, POC: 30 mg/dL — AB
Spec Grav, UA: >=1.03 — AB (ref 1.010–1.025)
Urobilinogen, UA: 0.2 E.U./dL
pH, UA: 6 (ref 5.0–8.0)

## 2021-03-20 MED ORDER — NITROFURANTOIN MONOHYD MACRO 100 MG PO CAPS
100.0000 mg | ORAL_CAPSULE | Freq: Two times a day (BID) | ORAL | 0 refills | Status: AC
  Filled 2021-03-20: qty 14, 7d supply, fill #0

## 2021-03-20 NOTE — ED Provider Notes (Signed)
[facility]    CSN: [id number] Arrival date & time: 03/20/21  1330      History   Chief Complaint Chief Complaint  Patient presents with   Urinary Frequency    Appt 2PM   Back Pain    HPI Jennifer Erickson is a 50 y.o. female.   HPI 50 year old female presents with urinary frequency and back pain for 3 days.  Reports history of UTIs.  PMH significant for discoid lupus erythematosus.  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   Elevated blood pressure reading 09/24/2017   Left shoulder pain 12/03/2016   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   Palpitations 07/11/2015   OSA on CPAP 07/10/2015   Large breasts 05/28/2015   Left tennis elbow 05/28/2015   Primary osteoarthritis of both knees 12/08/2014   Vitamin D deficiency 04/19/2014   Bilateral swelling of feet 05/02/2013   Left breast mass 11/22/2012   Obstructive sleep apnea 11/22/2012   Plantar fasciitis, bilateral 11/01/2012   Insomnia 12/30/2011   Obesity, Class III, BMI 40-49.9 (morbid obesity) ([place]) 08/19/2011   Uvular swelling 08/19/2011    Past Surgical History:  Procedure Laterality Date   DILATION AND CURETTAGE OF UTERUS     TONSILLECTOMY      OB History     Gravida  0   Para  0   Term  0   Preterm  0   AB  0   Living  0      SAB  0   IAB  0   Ectopic  0   Multiple  0   Live Births  0             Home Medications    Prior to Admission medications   Medication Sig Start Date End Date Taking? Authorizing Provider  nitrofurantoin, macrocrystal-monohydrate, (MACROBID) 100 MG capsule Take 1 capsule (100 mg total) by mouth 2 (two) times daily for 7 days. 03/20/21 03/27/21 Yes Eliezer Lofts, FNP  Continuous Blood Gluc Receiver (DEXCOM G6 RECEIVER) DEVI Use as directed. 08/28/20   Breeback, Jade L, PA-C  Continuous Blood Gluc Sensor (DEXCOM G6 SENSOR) MISC Use  as directed once weekly. 08/28/20   Breeback, Jade L, PA-C  Continuous Blood Gluc Sensor (FREESTYLE LIBRE 14 DAY SENSOR) MISC APPLY TO UPPER DELTOID EVERY 14 DAYS. USE WITH READER TO DETERMINE BLOOD SUGARS 10/08/20 10/08/21  Breeback, Luvenia Starch L, PA-C  Continuous Blood Gluc Transmit (DEXCOM G6 TRANSMITTER) MISC Use as directed daily. 08/28/20   Breeback, Jade L, PA-C  COVID-19 mRNA bivalent vaccine, Pfizer, injection Inject into the muscle. 01/01/21   Carlyle Basques, MD  etonogestrel (IMPLANON) 68 MG IMPL implant Inject 1 each into the skin once.    [provider]  hydroxychloroquine (PLAQUENIL) 200 MG tablet Take 1 tablet (200 mg total) by mouth 2 (two) times daily. 01/17/21     influenza vac split quadrivalent PF (FLUARIX)  0.5 ML injection Inject into the muscle. 01/01/21   Carlyle Basques, MD  ipratropium (ATROVENT) 0.06 % nasal spray Place 2 sprays into both nostrils 4 (four) times daily as needed for runny nose / postnasal drip 12/19/20   Emeterio Reeve, DO  Maca Root (MACA PO) Take by mouth.    [provider]  Multiple Vitamins-Minerals (ZINC PO) Take by mouth daily.    [provider]  predniSONE (DELTASONE) 20 MG tablet Take 1 tablet (20 mg total) by mouth 2 (two) times daily with a meal. 12/19/20   Emeterio Reeve, DO  VITAMIN D PO Take by mouth.    [provider]    Family History Family History  Problem Relation Age of Onset   Cancer Mother        brain   Hypertension Father     Diabetes Brother     Social History Social History   Tobacco Use   Smoking status: Never   Smokeless tobacco: Never  Vaping Use   Vaping Use: Never used  Substance Use Topics   Alcohol use: No   Drug use: No     Allergies   Aspirin, Augmentin [amoxicillin-pot clavulanate], Ciprofloxacin, Hydrocodone, and Ivp dye [iodinated diagnostic agents]   Review of Systems Review of Systems  Genitourinary:  Positive for frequency and urgency.    Physical Exam Triage Vital Signs ED Triage Vitals  Enc Vitals Group     BP 03/20/21 1355 132/86     Pulse Rate 03/20/21 1355 96     Resp 03/20/21 1355 17     Temp 03/20/21 1355 98.2 F (36.8 C)     Temp Source 03/20/21 1355 Oral     SpO2 03/20/21 1355 98 %     Weight --      Height --      Head Circumference --      Peak Flow --      Pain Score 03/20/21 1353 4     Pain Loc --      Pain Edu? --      Excl. in [place]? --    No data found.  Updated Vital Signs BP 132/86 (BP Location: Right Arm)    Pulse 96    Temp 98.2 F (36.8 C) (Oral)    Resp 17    SpO2 98%      Physical Exam Vitals and nursing note reviewed.  Constitutional:      Appearance: She is obese.  HENT:     Head: Normocephalic and atraumatic.     Mouth/Throat:     Mouth: Mucous membranes are moist.     Pharynx: Oropharynx is clear.  Eyes:     Extraocular Movements: Extraocular movements intact.     Conjunctiva/sclera: Conjunctivae normal.     Pupils: Pupils are equal, round, and reactive to light.  Cardiovascular:     Rate and Rhythm: Normal rate and regular rhythm.     Pulses: Normal pulses.     Heart sounds: Normal heart sounds.  Pulmonary:     Effort: Pulmonary effort is normal.     Breath sounds: Normal breath sounds.  Abdominal:     Tenderness: There is no right CVA tenderness or left CVA tenderness.  Musculoskeletal:     Cervical back: Normal range of motion and neck supple.  Skin:    General: Skin is warm and dry.  Neurological:     General: No  focal deficit present.     Mental Status: She is oriented  to person, place, and time. Mental status is at baseline.     UC Treatments / Results  Labs (all labs ordered are listed, but only abnormal results are displayed) Labs Reviewed  POCT URINALYSIS DIP (MANUAL ENTRY) - Abnormal; Notable for the following components:      Result Value   Clarity, UA cloudy (*)    Ketones, POC UA trace (5) (*)    Spec Grav, UA >=1.030 (*)    Blood, UA trace-intact (*)    Protein Ur, POC =30 (*)    Nitrite, UA Positive (*)    Leukocytes, UA Small (1+) (*)    All other components within normal limits  URINE CULTURE    EKG   Radiology No results found.  Procedures Procedures (including critical care time)  Medications Ordered in UC Medications - No data to display  Initial Impression / Assessment and Plan / UC Course  I have reviewed the triage vital signs and the nursing notes.  Pertinent labs & imaging results that were available during my care of the patient were reviewed by me and considered in my medical decision making (see chart for details).     MDM:1.  Acute cystitis with hematuria-Rx'd Macrobid. Advised patient to take medication as directed with food to completion.  Encouraged patient to increase daily water intake while taking this medication.  Advised patient we will follow-up with urine culture results once received.  Patient discharged home, hemodynamically stable.  Final Clinical Impressions(s) / UC Diagnoses   Final diagnoses:  Urinary frequency  Acute cystitis with hematuria     Discharge Instructions      Advised patient to take medication as directed with food to completion.  Encouraged patient to increase daily water intake while taking this medication.  Advised patient we will follow-up with urine culture results once received.     ED Prescriptions     Medication Sig Dispense Auth. Provider   nitrofurantoin, macrocrystal-monohydrate, (MACROBID) 100 MG  capsule Take 1 capsule (100 mg total) by mouth 2 (two) times daily for 7 days. 14 capsule Eliezer Lofts, FNP      PDMP not reviewed this encounter.   Eliezer Lofts, [place] 03/20/21 1529

## 2021-03-20 NOTE — Discharge Instructions (Addendum)
Advised patient to take medication as directed with food to completion.  Encouraged patient to increase daily water intake while taking this medication.  Advised patient we will follow-up with urine culture results once received.

## 2021-03-20 NOTE — ED Triage Notes (Signed)
Pt c/o urinary frequency and back pain x 3 days. Hx of UTIs turning into kidney infection. Pain 4/10

## 2021-03-21 ENCOUNTER — Other Ambulatory Visit (HOSPITAL_BASED_OUTPATIENT_CLINIC_OR_DEPARTMENT_OTHER): Payer: Self-pay

## 2021-03-22 LAB — URINE CULTURE
MICRO NUMBER:: 12788099
SPECIMEN QUALITY:: ADEQUATE

## 2021-04-05 ENCOUNTER — Emergency Department
Admission: EM | Admit: 2021-04-05 | Discharge: 2021-04-05 | Disposition: A | Discharge status: Home / Self Care | Payer: Federal, State, Local not specified - PPO | Source: Home / Self Care | Attending: Family Medicine | Admitting: Family Medicine

## 2021-04-05 ENCOUNTER — Emergency Department (INDEPENDENT_AMBULATORY_CARE_PROVIDER_SITE_OTHER): Payer: Federal, State, Local not specified - PPO

## 2021-04-05 ENCOUNTER — Other Ambulatory Visit: Payer: Self-pay

## 2021-04-05 DIAGNOSIS — M79622 Pain in left upper arm: Secondary | ICD-10-CM

## 2021-04-05 DIAGNOSIS — R221 Localized swelling, mass and lump, neck: Secondary | ICD-10-CM | POA: Diagnosis not present

## 2021-04-05 DIAGNOSIS — M25512 Pain in left shoulder: Secondary | ICD-10-CM | POA: Diagnosis not present

## 2021-04-05 DIAGNOSIS — S0093XA Contusion of unspecified part of head, initial encounter: Secondary | ICD-10-CM | POA: Diagnosis not present

## 2021-04-05 DIAGNOSIS — M898X1 Other specified disorders of bone, shoulder: Secondary | ICD-10-CM

## 2021-04-05 DIAGNOSIS — M542 Cervicalgia: Secondary | ICD-10-CM

## 2021-04-05 DIAGNOSIS — R222 Localized swelling, mass and lump, trunk: Secondary | ICD-10-CM | POA: Diagnosis not present

## 2021-04-05 DIAGNOSIS — M47812 Spondylosis without myelopathy or radiculopathy, cervical region: Secondary | ICD-10-CM | POA: Diagnosis not present

## 2021-04-05 MED ORDER — PREDNISONE 20 MG PO TABS: ORAL_TABLET | ORAL | 0 refills | Status: DC

## 2021-04-05 NOTE — Discharge Instructions (Signed)
Apply ice pack to back of neck for 20 to 30 minutes, 3 to 4 times daily  Continue until pain decreases.  May take Tylenol as needed for pain.
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
## 2021-04-05 NOTE — ED Triage Notes (Signed)
Pt states that she has some left under arm pain.  Pt states that she received a wax under her arm and she still feels pressure. X2 days
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
## 2021-04-08 ENCOUNTER — Ambulatory Visit (INDEPENDENT_AMBULATORY_CARE_PROVIDER_SITE_OTHER): Payer: Self-pay | Admitting: Physician Assistant

## 2021-04-08 DIAGNOSIS — Z91199 Patient's noncompliance with other medical treatment and regimen due to unspecified reason: Secondary | ICD-10-CM

## 2021-04-08 NOTE — Progress Notes (Signed)
No show

## 2021-04-22 ENCOUNTER — Other Ambulatory Visit (HOSPITAL_BASED_OUTPATIENT_CLINIC_OR_DEPARTMENT_OTHER): Payer: Self-pay

## 2021-04-22 MED ORDER — ZOSTER VAC RECOMB ADJUVANTED 50 MCG/0.5ML IM SUSR
INTRAMUSCULAR | 0 refills | Status: DC
  Filled 2021-04-22: qty 1, 1d supply, fill #0

## 2021-04-23 ENCOUNTER — Other Ambulatory Visit: Payer: Self-pay

## 2021-04-23 ENCOUNTER — Ambulatory Visit: Payer: Federal, State, Local not specified - PPO | Admitting: Sports Medicine

## 2021-04-23 DIAGNOSIS — S0101XA Laceration without foreign body of scalp, initial encounter: Secondary | ICD-10-CM | POA: Diagnosis not present
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
## 2021-05-22 ENCOUNTER — Other Ambulatory Visit (HOSPITAL_BASED_OUTPATIENT_CLINIC_OR_DEPARTMENT_OTHER): Payer: Self-pay

## 2021-06-04 ENCOUNTER — Encounter: Payer: Self-pay | Admitting: Plastic Surgery

## 2021-06-04 ENCOUNTER — Ambulatory Visit: Payer: Federal, State, Local not specified - PPO | Admitting: Plastic Surgery

## 2021-06-04 VITALS — BP 144/98 | HR 100 | Ht 66.0 in | Wt 284.4 lb

## 2021-06-04 DIAGNOSIS — N62 Hypertrophy of breast: Secondary | ICD-10-CM | POA: Diagnosis not present

## 2021-06-04 DIAGNOSIS — N6322 Unspecified lump in the left breast, upper inner quadrant: Secondary | ICD-10-CM | POA: Diagnosis not present

## 2021-06-04 DIAGNOSIS — R7303 Prediabetes: Secondary | ICD-10-CM

## 2021-06-04 NOTE — Progress Notes (Signed)
? ?  Patient ID: Jennifer Erickson, female    DOB: 1970/06/12, 51 y.o.   MRN: [id number] ? ? ?Chief Complaint  ?Patient presents with  ? Advice Only  ? Breast Problem  ? ? ?Mammary Hyperplasia: ?The patient is a 51 y.o. female with a history of mammary hyperplasia for several years.  She has extremely large breasts causing symptoms that include the following: ?Back pain in the upper and lower back, including neck pain. She pulls or pins her bra straps to provide better lift and relief of the pressure and pain. She notices relief by holding her breast up manually.  Her shoulder straps cause grooves and pain and pressure that requires padding for relief. Pain medication is sometimes required with motrin and tylenol.  Activities that are hindered by enlarged breasts include: exercise and running.  She has tried supportive clothing as well as fitted bras without improvement.  She had a left breast biopsy in the past.  It was negative and she did not need in the radiation ? ?Her breasts are extremely large with the left breast being larger than the right.  She has hyperpigmentation of the inframammary area on both sides.  The sternal to nipple distance on the right is 46 cm and the left is 50 cm.  She is 5 feet 6 inches tall and weighs 284 pounds.  The BMI = 45.8 kg/m?[name]  Preoperative bra size = M cup.  The estimated excess breast tissue to be removed at the time of surgery = 1400 grams on the left and 1400 grams on the right.  Mammogram history: 2022 and negative from the [place].  Family history of breast cancer:  no.  Tobacco use:  no.   The patient expresses the desire to pursue surgical intervention. ? ? ? ?Review of Systems  ?Constitutional:  Positive for activity change. Negative for appetite change.  ?Eyes: Negative.   ?Respiratory: Negative.  Negative for chest tightness and shortness of breath.   ?Cardiovascular:  Negative for leg swelling.  ?Gastrointestinal:  Negative for abdominal distention.   ?Endocrine: Negative.   ?Genitourinary: Negative.   ?Musculoskeletal:  Positive for back pain and neck pain.  ?Skin:  Positive for color change and rash.  ?Hematological: Negative.   ?Psychiatric/Behavioral: Negative.    ? ?Past Medical History:  ?Diagnosis Date  ? Lupus ([place])   ? Personal history of amenorrhea   ? Sinusitis   ?  ?Past Surgical History:  ?Procedure Laterality Date  ? DILATION AND CURETTAGE OF UTERUS    ? TONSILLECTOMY    ?  ? ? ?Current Outpatient Medications:  ?  Continuous Blood Gluc Sensor (FREESTYLE LIBRE 14 DAY SENSOR) MISC, APPLY TO UPPER DELTOID EVERY 14 DAYS. USE WITH READER TO DETERMINE BLOOD SUGARS, Disp: 2 each, Rfl: 11 ?  etonogestrel (IMPLANON) 68 MG IMPL implant, Inject 1 each into the skin once., Disp: , Rfl:  ?  hydroxychloroquine (PLAQUENIL) 200 MG tablet, Take 1 tablet (200 mg total) by mouth 2 (two) times daily., Disp: 180 tablet, Rfl: 3 ?  Multiple Vitamins-Minerals (ZINC PO), Take by mouth daily., Disp: , Rfl:  ?  VITAMIN D PO, Take by mouth., Disp: , Rfl:  ?  Zoster Vaccine Adjuvanted (SHINGRIX) injection, Inject into the muscle., Disp: 1 each, Rfl: 0  ? ?Objective:  ? ?Vitals:  ? 06/04/21 1457  ?BP: (!) 144/98  ?Pulse: 100  ?SpO2: 97%  ? ? ?Physical Exam ?Vitals and nursing note reviewed.  ?Constitutional:   ?  Appearance: Normal appearance.  ?HENT:  ?   Head: Normocephalic and atraumatic.  ?Cardiovascular:  ?   Rate and Rhythm: Normal rate.  ?   Pulses: Normal pulses.  ?Pulmonary:  ?   Effort: Pulmonary effort is normal. No respiratory distress.  ?Abdominal:  ?   General: There is no distension.  ?   Palpations: Abdomen is soft.  ?   Tenderness: There is no abdominal tenderness.  ?Musculoskeletal:     ?   General: No swelling or deformity. Normal range of motion.  ?Skin: ?   General: Skin is warm.  ?   Capillary Refill: Capillary refill takes less than 2 seconds.  ?   Coloration: Skin is not jaundiced.  ?   Findings: No bruising.  ?Neurological:  ?   Mental Status: She is  alert and oriented to person, place, and time.  ?Psychiatric:     ?   Mood and Affect: Mood normal.     ?   Behavior: Behavior normal.     ?   Thought Content: Thought content normal.     ?   Judgment: Judgment normal.  ? ? ?Assessment & Plan:  ?Pre-diabetes ? ?Mass of upper inner quadrant of left breast ? ?Large breasts ? ?The procedure the patient selected and that was best for the patient was discussed. The risk were discussed and include but not limited to the following:  Breast asymmetry, fluid accumulation, firmness of the breast, inability to breast feed, loss of nipple or areola, skin loss, change in skin and nipple sensation, fat necrosis of the breast tissue, bleeding, infection and healing delay.  There are risks of anesthesia and injury to nerves or blood vessels.  Allergic reaction to tape, suture and skin glue are possible.  There will be swelling.  Any of these can lead to the need for revisional surgery.  A breast reduction has potential to interfere with diagnostic procedures in the future.  This procedure is best done when the breast is fully developed.  Changes in the breast will continue to occur over time: pregnancy, weight gain or weigh loss. ?   ?Total time: 45 minutes. This includes time spent with the patient during the visit as well as time spent before and after the visit reviewing the chart, documenting the encounter, ordering pertinent studies and literature for the patient.   ?Physical therapy:  ordered ?Mammogram:  done 2022 and I reviewed the results which are in Epic from May 2022 ?Healthy Weight and Wellness: consult ? ?I think the patient would benefit tremendously from a breast reduction.  She did not allow pictures because she has worked for it in the past and is concerned about them being shared.  I completely understand.  I also expressed that pictures would have to be taken prior to surgery for submission to insurance.  She seems to be okay with that.  Pictures will need to be  taken at the next visit if patient is okay with that. ? ?Plan for bilateral breast reduction amputation technique with possible liposuction.  Patient will follow-up in 6 to 8 weeks with PA. ? ?Loel Lofty Kasheena Sambrano, DO ?

## 2021-06-07 ENCOUNTER — Other Ambulatory Visit (HOSPITAL_BASED_OUTPATIENT_CLINIC_OR_DEPARTMENT_OTHER): Payer: Self-pay

## 2021-06-07 ENCOUNTER — Telehealth: Payer: Federal, State, Local not specified - PPO | Admitting: Family

## 2021-06-07 DIAGNOSIS — J301 Allergic rhinitis due to pollen: Secondary | ICD-10-CM | POA: Diagnosis not present

## 2021-06-07 MED ORDER — FLUTICASONE PROPIONATE 50 MCG/ACT NA SUSP
2.0000 | Freq: Every day | NASAL | 6 refills | Status: DC
  Filled 2021-06-07: qty 16, 30d supply, fill #0

## 2021-06-07 MED ORDER — LEVOCETIRIZINE DIHYDROCHLORIDE 5 MG PO TABS
5.0000 mg | ORAL_TABLET | Freq: Every evening | ORAL | 1 refills | Status: DC
  Filled 2021-06-07: qty 90, 90d supply, fill #0

## 2021-06-07 NOTE — Progress Notes (Signed)
E visit for Allergic Rhinitis ?We are sorry that you are not feeling well.  Here is how we plan to help! ? ?Based on what you have shared with me it looks like you have Allergic Rhinitis.  Rhinitis is when a reaction occurs that causes nasal congestion, runny nose, sneezing, and itching.  Most types of rhinitis are caused by an inflammation and are associated with symptoms in the eyes ears or throat. ?There are several types of rhinitis.  The most common are acute rhinitis, which is usually caused by a viral illness, allergic or seasonal rhinitis, and nonallergic or year-round rhinitis.  Nasal allergies occur certain times of the year.  Allergic rhinitis is caused when allergens in the air trigger the release of histamine in the body.  Histamine causes itching, swelling, and fluid to build up in the fragile linings of the nasal passages, sinuses and eyelids.  An itchy nose and clear discharge are common. ? ?I recommend the following over the counter treatments: ?You should take a daily dose of antihistamine and Xyzal 5 mg take 1 tablet daily ? ?I also would recommend a nasal spray: ?Flonase 2 sprays into each nostril once daily ? ?You may also benefit from eye drops such as: ?Visine 1-2 drops each eye twice daily as needed ? ?HOME CARE: ? ?You can use an over-the-counter saline nasal spray as needed ?Avoid areas where there is heavy dust, mites, or molds ?Stay indoors on windy days during the pollen season ?Keep windows closed in home, at least in bedroom; use air conditioner. ?Use high-efficiency house air filter ?Keep windows closed in car, turn [facility] on re-circulate ?Avoid playing out with dog during pollen season ? ?GET HELP RIGHT AWAY IF: ? ?If your symptoms do not improve within 10 days ?You become short of breath ?You develop yellow or green discharge from your nose for over 3 days ?You have coughing fits ? ?MAKE SURE YOU: ? ?Understand these instructions ?Will watch your condition ?Will get help right away if  you are not doing well or get worse ? ?Thank you for choosing an e-visit. ?Your e-visit answers were reviewed by a board certified advanced clinical practitioner to complete your personal care plan. Depending upon the condition, your plan could have included both over the counter or prescription medications. ?Please review your pharmacy choice. Be sure that the pharmacy you have chosen is open so that you can pick up your prescription now.  If there is a problem you may message your provider in [place] to have the prescription routed to another pharmacy. ?Your safety is important to [place]. If you have drug allergies check your prescription carefully.  ?For the next 24 hours, you can use MyChart to ask questions about today?s visit, request a non-urgent call back, or ask for a work or school excuse from your e-visit provider. ?You will get an email in the next two days asking about your experience. I hope that your e-visit has been valuable and will speed your recovery. ? ? ?Approximately 5 minutes was spent documenting and reviewing patient's chart.  ? ? ? ? ? ?

## 2021-06-19 ENCOUNTER — Ambulatory Visit: Payer: Federal, State, Local not specified - PPO

## 2021-07-02 ENCOUNTER — Other Ambulatory Visit (HOSPITAL_BASED_OUTPATIENT_CLINIC_OR_DEPARTMENT_OTHER): Payer: Self-pay

## 2021-07-15 DIAGNOSIS — H40013 Open angle with borderline findings, low risk, bilateral: Secondary | ICD-10-CM | POA: Diagnosis not present

## 2021-07-18 ENCOUNTER — Other Ambulatory Visit (HOSPITAL_BASED_OUTPATIENT_CLINIC_OR_DEPARTMENT_OTHER): Payer: Self-pay

## 2021-07-18 DIAGNOSIS — Z79899 Other long term (current) drug therapy: Secondary | ICD-10-CM | POA: Diagnosis not present

## 2021-07-18 DIAGNOSIS — L930 Discoid lupus erythematosus: Secondary | ICD-10-CM | POA: Diagnosis not present

## 2021-07-18 DIAGNOSIS — G4733 Obstructive sleep apnea (adult) (pediatric): Secondary | ICD-10-CM | POA: Diagnosis not present

## 2021-07-18 MED ORDER — HYDROXYCHLOROQUINE SULFATE 200 MG PO TABS
ORAL_TABLET | ORAL | 3 refills | Status: DC
  Filled 2021-07-18: qty 180, 90d supply, fill #0
  Filled 2021-10-18 – 2021-11-01 (×2): qty 180, 90d supply, fill #1

## 2021-08-01 ENCOUNTER — Other Ambulatory Visit (HOSPITAL_BASED_OUTPATIENT_CLINIC_OR_DEPARTMENT_OTHER): Payer: Self-pay

## 2021-08-01 DIAGNOSIS — L930 Discoid lupus erythematosus: Secondary | ICD-10-CM | POA: Diagnosis not present

## 2021-08-01 MED ORDER — SHINGRIX 50 MCG/0.5ML IM SUSR
INTRAMUSCULAR | 0 refills | Status: DC
  Filled 2021-08-01: qty 1, 1d supply, fill #0

## 2021-08-14 ENCOUNTER — Ambulatory Visit: Payer: Federal, State, Local not specified - PPO | Admitting: Sports Medicine

## 2021-08-14 DIAGNOSIS — L930 Discoid lupus erythematosus: Secondary | ICD-10-CM

## 2021-08-14 NOTE — Assessment & Plan Note (Signed)
Continue to follow-up with PCP, FMLA paperwork filled out today. ?

## 2021-08-14 NOTE — Progress Notes (Signed)
? ? ?  Procedures performed today:   ? ?None. ? ?Independent interpretation of notes and tests performed by another provider:  ? ?None. ? ?Brief History, Exam, Impression, and Recommendations:   ? ?Discoid lupus erythematosus ?Continue to follow-up with PCP, FMLA paperwork filled out today. ? ? ? ?___________________________________________ ?Gwen Her. Dianah Field, M.D., ABFM., CAQSM. ?Primary Care and Sports Medicine ?[place] ? ?Adjunct Instructor of Family Medicine  ?[employer] ?

## 2021-08-19 ENCOUNTER — Ambulatory Visit (INDEPENDENT_AMBULATORY_CARE_PROVIDER_SITE_OTHER): Payer: Federal, State, Local not specified - PPO | Admitting: Obstetrics & Gynecology

## 2021-08-19 ENCOUNTER — Encounter: Payer: Self-pay | Admitting: Obstetrics & Gynecology

## 2021-08-19 VITALS — BP 134/88 | Ht 66.0 in | Wt 289.0 lb

## 2021-08-19 DIAGNOSIS — Z01419 Encounter for gynecological examination (general) (routine) without abnormal findings: Secondary | ICD-10-CM

## 2021-08-19 DIAGNOSIS — Z6841 Body Mass Index (BMI) 40.0 and over, adult: Secondary | ICD-10-CM | POA: Diagnosis not present

## 2021-08-19 DIAGNOSIS — Z3046 Encounter for surveillance of implantable subdermal contraceptive: Secondary | ICD-10-CM | POA: Diagnosis not present

## 2021-08-19 NOTE — Progress Notes (Signed)
Jennifer Erickson October 02, 1970 [id number]   History:    51 y.o.  Widowed x 06/2017.  Has 2 dogs. PHD in Psychology.   RP:  Established patient presenting for annual gyn exam    HPI: Well on Nexplanon since September 2020.  No breakthrough bleeding.  No pelvic pain.  Currently abstinent.  Normal vaginal secretions.  Urine and bowel movements normal.  Breasts normal. Screening mammo scheduled. Body mass index improved to 46.65.  Walking the dogs, going to the gym.  Lower calorie nutrition. Health labs with family physician.  Systemic lupus erythematosus on Plaquenil followed by rheumatology.   Past medical history,surgical history, family history and social history were all reviewed and documented in the EPIC chart.  Gynecologic History No LMP recorded. Patient has had an implant.  Obstetric History OB History  Gravida Para Term Preterm AB Living  0 0 0 0 0 0  SAB IAB Ectopic Multiple Live Births  0 0 0 0 0     ROS: A ROS was performed and pertinent positives and negatives are included in the history. GENERAL: No fevers or chills. HEENT: No change in vision, no earache, sore throat or sinus congestion. NECK: No pain or stiffness. CARDIOVASCULAR: No chest pain or pressure. No palpitations. PULMONARY: No shortness of breath, cough or wheeze. GASTROINTESTINAL: No abdominal pain, nausea, vomiting or diarrhea, melena or bright red blood per rectum. GENITOURINARY: No urinary frequency, urgency, hesitancy or dysuria. MUSCULOSKELETAL: No joint or muscle pain, no back pain, no recent trauma. DERMATOLOGIC: No rash, no itching, no lesions. ENDOCRINE: No polyuria, polydipsia, no heat or cold intolerance. No recent change in weight. HEMATOLOGICAL: No anemia or easy bruising or bleeding. NEUROLOGIC: No headache, seizures, numbness, tingling or weakness. PSYCHIATRIC: No depression, no loss of interest in normal activity or change in sleep pattern.     Exam:   BP 134/88 (BP Location: Right Arm,  Patient Position: Sitting, Cuff Size: Large)   Ht 5' 6"  (1.676 m)   Wt 289 lb (131.1 kg)   BMI 46.65 kg/m   Body mass index is 46.65 kg/m.  General appearance : Well developed well nourished female. No acute distress HEENT: Eyes: no retinal hemorrhage or exudates,  Neck supple, trachea midline, no carotid bruits, no thyroidmegaly Lungs: Clear to auscultation, no rhonchi or wheezes, or rib retractions  Heart: Regular rate and rhythm, no murmurs or gallops Breast:Examined in sitting and supine position were symmetrical in appearance, no palpable masses or tenderness,  no skin retraction, no nipple inversion, no nipple discharge, no skin discoloration, no axillary or supraclavicular lymphadenopathy Abdomen: no palpable masses or tenderness, no rebound or guarding Extremities: no edema or skin discoloration or tenderness  Pelvic: Vulva: Normal             Vagina: No gross lesions or discharge  Cervix: No gross lesions or discharge  Uterus  AV, normal size, shape and consistency, non-tender and mobile  Adnexa  Without masses or tenderness  Anus: Normal   Assessment/Plan:  51 y.o. female for annual exam   1. Well female exam with routine gynecological exam Well on Nexplanon since September 2020.  No breakthrough bleeding.  No pelvic pain.  Currently abstinent.  Normal vaginal secretions.  Urine and bowel movements normal.  Breasts normal. Screening mammo scheduled. Body mass index improved to 46.65.  Walking the dogs, going to the gym.  Lower calorie nutrition. Health labs with family physician.  Systemic lupus erythematosus on Plaquenil followed by rheumatology.  2. Encounter  for surveillance of implantable subdermal contraceptive Well on Nexplanon since September 2020.  No breakthrough bleeding.  No pelvic pain.  Currently abstinent.  Will f/u for change of Nexplanon in 11/2021.  3. Class 3 severe obesity due to excess calories with serious comorbidity and body mass index (BMI) of 45.0 to  49.9 in adult [facility])  Body mass index improved to 46.65.  Walking the dogs, going to the gym. Lower calorie diet to continue.  Princess Bruins MD, 12:55 PM 08/19/2021

## 2021-08-20 ENCOUNTER — Encounter: Payer: Self-pay | Admitting: Obstetrics & Gynecology

## 2021-08-27 DIAGNOSIS — Z1231 Encounter for screening mammogram for malignant neoplasm of breast: Secondary | ICD-10-CM | POA: Diagnosis not present

## 2021-08-29 ENCOUNTER — Encounter: Payer: Self-pay | Admitting: Obstetrics & Gynecology

## 2021-09-05 ENCOUNTER — Ambulatory Visit: Payer: Federal, State, Local not specified - PPO | Admitting: Surgical

## 2021-09-13 ENCOUNTER — Telehealth: Payer: Self-pay | Admitting: *Deleted

## 2021-09-13 NOTE — Telephone Encounter (Signed)
Called pt to follow up on PT referral since appt was cancelled she states that she didn't have time for PT at that time but she will work on getting in touch with them and making those appts.   She also wondered about the Weight management referral so I advised I would check on it. Currently do not see a referral for that in the [facility]

## 2021-09-19 ENCOUNTER — Other Ambulatory Visit (HOSPITAL_BASED_OUTPATIENT_CLINIC_OR_DEPARTMENT_OTHER): Payer: Self-pay

## 2021-09-20 ENCOUNTER — Encounter: Payer: Self-pay | Admitting: Physician Assistant

## 2021-10-08 ENCOUNTER — Emergency Department
Admission: RE | Admit: 2021-10-08 | Discharge: 2021-10-08 | Disposition: A | Discharge status: Home / Self Care | Payer: Federal, State, Local not specified - PPO | Source: Ambulatory Visit

## 2021-10-08 ENCOUNTER — Other Ambulatory Visit: Payer: Self-pay | Admitting: Neurology

## 2021-10-08 ENCOUNTER — Other Ambulatory Visit (HOSPITAL_BASED_OUTPATIENT_CLINIC_OR_DEPARTMENT_OTHER): Payer: Self-pay

## 2021-10-08 VITALS — BP 136/83 | HR 98 | Temp 99.6°F | Resp 18 | Ht 66.0 in | Wt 280.0 lb

## 2021-10-08 DIAGNOSIS — J309 Allergic rhinitis, unspecified: Secondary | ICD-10-CM

## 2021-10-08 DIAGNOSIS — R059 Cough, unspecified: Secondary | ICD-10-CM | POA: Diagnosis not present

## 2021-10-08 DIAGNOSIS — J0100 Acute maxillary sinusitis, unspecified: Secondary | ICD-10-CM

## 2021-10-08 MED ORDER — FREESTYLE LIBRE 14 DAY SENSOR MISC
11 refills | Status: DC
  Filled 2021-10-08 – 2021-10-10 (×2): qty 2, 28d supply, fill #0
  Filled 2021-12-27: qty 2, 28d supply, fill #1
  Filled 2022-02-11: qty 2, 28d supply, fill #2
  Filled 2022-03-11: qty 2, 28d supply, fill #3
  Filled 2022-05-15: qty 2, 28d supply, fill #4
  Filled 2022-07-08: qty 2, 28d supply, fill #5
  Filled 2022-08-13: qty 2, 28d supply, fill #6
  Filled 2022-09-08: qty 2, 28d supply, fill #7
  Filled 2022-10-05: qty 2, 28d supply, fill #8

## 2021-10-08 MED ORDER — FEXOFENADINE HCL 180 MG PO TABS
180.0000 mg | ORAL_TABLET | Freq: Every day | ORAL | 0 refills | Status: DC
  Filled 2021-10-08: qty 30, 30d supply, fill #0

## 2021-10-08 MED ORDER — BENZONATATE 200 MG PO CAPS
200.0000 mg | ORAL_CAPSULE | Freq: Three times a day (TID) | ORAL | 0 refills | Status: AC | PRN
  Filled 2021-10-08: qty 40, 14d supply, fill #0

## 2021-10-08 MED ORDER — DOXYCYCLINE HYCLATE 100 MG PO CAPS
100.0000 mg | ORAL_CAPSULE | Freq: Two times a day (BID) | ORAL | 0 refills | Status: AC
  Filled 2021-10-08: qty 20, 10d supply, fill #0

## 2021-10-08 MED ORDER — PREDNISONE 20 MG PO TABS
ORAL_TABLET | ORAL | 0 refills | Status: DC
  Filled 2021-10-08: qty 15, 5d supply, fill #0

## 2021-10-08 NOTE — Discharge Instructions (Addendum)
Advised patient to hold Xyzal while taking these medications over the next 10 days. Instructed patient to take medication as directed with food to completion.  Advised patient to take prednisone and Allegra with first dose of Doxycycline for the next 5 of 10 days.  Advised may use Allegra as needed afterwards for concurrent postnasal drainage/drip. Advised may use Tessalon Perles daily or as needed for cough.  Encouraged patient to increase daily water intake while taking these medications.  Advised if symptoms worsen and/or unresolved please follow-up with PCP or here for further evaluation.

## 2021-10-08 NOTE — ED Triage Notes (Signed)
Sinus infection symptoms started 11 days ago Pt had left over keflex ( 500mg  TID for 7 days ) Pt started keflex 1 week ago - taking BID  Also sudafed q 4 hours  Now has a productive cough due to sinus drainage  Headache  Per pt she sounds bad, but feels ok other than being tired

## 2021-10-08 NOTE — ED Provider Notes (Signed)
[facility]enn[facility]    CSN: [id number] Arrival date & time: 10/08/21  0955      History   Chief Complaint Chief Complaint  Patient presents with   Cough    HPI Jennifer Erickson is a 51 y.o. female.   HPI Very pleasant 51 year old female presents with productive cough sinus drainage, and headache for 1 week.  Patient reports taking Keflex 500 mg 3 times daily for 7 days reports left over from previous prescription.  PMH significant for morbid obesity, discoid lupus erythematosus, uvular swelling, postnasal drip, and OSA.  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   Alopecia, 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   Elevated blood pressure reading 09/24/2017   Left shoulder pain 12/03/2016   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   Palpitations 07/11/2015   OSA on CPAP 07/10/2015   Large breasts 05/28/2015   Left tennis elbow 05/28/2015   Primary osteoarthritis of both knees 12/08/2014   Vitamin D deficiency 04/19/2014   Bilateral swelling of feet 05/02/2013   Left breast mass 11/22/2012   Obstructive sleep apnea 11/22/2012   Plantar fasciitis, bilateral 11/01/2012   Insomnia 12/30/2011   Obesity, Class III, BMI 40-49.9 (morbid obesity) ([place]) 08/19/2011   Uvular swelling 08/19/2011    Past Surgical History:  Procedure Laterality Date   DILATION AND CURETTAGE OF UTERUS     TONSILLECTOMY      OB History     Gravida   0   Para  0   Term  0   Preterm  0   AB  0   Living  0      SAB  0   IAB  0   Ectopic  0   Multiple  0   Live Births  0            Home Medications    Prior to Admission medications   Medication Sig Start Date End Date Taking? Authorizing Provider  benzonatate (TESSALON) 200 MG capsule Take 1 capsule (200 mg total) by mouth 3 (three) times daily as needed for up to 7 days for cough. 10/08/21 10/15/21 Yes Eliezer Lofts, FNP  doxycycline (VIBRAMYCIN) 100 MG capsule Take 1 capsule (100 mg total) by mouth 2 (two) times daily for 10 days. 10/08/21 10/18/21 Yes Eliezer Lofts, FNP  fexofenadine [facility] ALLERGY) 180 MG tablet Take 1 tablet (180 mg total) by mouth daily for 15 days. 10/08/21 10/23/21 Yes Eliezer Lofts, FNP  predniSONE (DELTASONE) 20 MG tablet Take 3 tabs PO daily x 5 days. 10/08/21  Yes Eliezer Lofts, FNP  Continuous Blood Gluc Sensor (FREESTYLE LIBRE 14 DAY SENSOR) MISC APPLY TO UPPER DELTOID EVERY 14 DAYS. USE WITH READER TO DETERMINE BLOOD SUGARS 10/08/20 10/21/21  Breeback, Royetta Car, PA-C  etonogestrel (IMPLANON) 68 MG IMPL implant Inject 1 each into the skin once.    [provider]  fluticasone (FLONASE) 50 MCG/ACT nasal spray Place 2  sprays into both nostrils daily. Patient not taking: Reported on 10/08/2021 06/07/21   Jannifer Rodney A, FNP  hydroxychloroquine (PLAQUENIL) 200 MG tablet Take 1 tablet (200 mg total) by mouth 2 (two) times daily. 01/17/21     hydroxychloroquine (PLAQUENIL) 200 MG tablet Take 1 tablet by mouth with food or milk twice a day Patient not taking: Reported on 08/19/2021 07/18/21     levocetirizine (XYZAL) 5 MG tablet Take 1 tablet (5 mg total) by mouth every evening. 06/07/21   Junie Spencer, FNP  Multiple Vitamins-Minerals (ZINC PO) Take by mouth daily.    [provider]  VITAMIN D PO Take by mouth.    [provider]  Zoster Vaccine Adjuvanted [facility]) injection Inject into the muscle. 04/22/21   Judyann Munson, MD  Zoster Vaccine Adjuvanted [facility]) injection Inject into the muscle. 08/01/21   Judyann Munson, MD    Family History Family History  Problem Relation Age of Onset   Cancer Mother        brain   Hypertension Father    Diabetes Brother     Social History Social History   Tobacco Use   Smoking status: Never   Smokeless tobacco: Never  Vaping Use   Vaping Use: Never used  Substance Use Topics   Alcohol use: No   Drug use: No     Allergies   Aspirin, Augmentin [amoxicillin-pot clavulanate], Ciprofloxacin, Hydrocodone, Ivp dye [iodinated contrast media], and Sulfa antibiotics   Review of Systems Review of Systems  HENT:  Positive for congestion, postnasal drip and sinus pressure.   Respiratory:  Positive for cough.   All other systems reviewed and are negative.    Physical Exam Triage Vital Signs ED Triage Vitals [10/08/21 1005]  Enc Vitals Group     BP 136/83     Pulse Rate 98     Resp 18     Temp 99.6 F (37.6 C)     Temp Source Oral     SpO2 96 %     Weight      Height      Head Circumference      Peak Flow      Pain Score      Pain Loc      Pain Edu?      Excl. in GC?    No data found.  Updated Vital Signs BP 136/83 (BP Location: Left Arm)   Pulse 98   Temp 99.6 F (37.6 C) (Oral)   Resp 18   Ht 5\' 6"  (1.676 m)   Wt 280 lb (127 kg)   SpO2 96%   BMI 45.19 kg/m     Physical Exam Vitals and nursing note reviewed.  Constitutional:      General: She is not in acute distress.    Appearance: She is obese. She is not ill-appearing.  HENT:     Head: Normocephalic and atraumatic.     Right Ear: External ear normal.     Left Ear: External ear normal.     Ears:     Comments: Moderate eustachian tube dysfunction noted bilaterally; TM's are clear, retracted with serous effusions noted    Mouth/Throat:     Mouth: Mucous membranes are moist.     Pharynx: Oropharynx is clear.     Comments: Moderate to significant amount of clear  drainage of posterior oropharynx noted Eyes:     Extraocular Movements: Extraocular movements intact.     Conjunctiva/sclera: Conjunctivae normal.  Pupils: Pupils are equal, round, and reactive to light.  Cardiovascular:     Rate and Rhythm: Normal rate and regular rhythm.     Pulses: Normal pulses.     Heart sounds: Normal heart sounds.  Pulmonary:     Effort: Pulmonary effort is normal.     Breath sounds: Normal breath sounds. No wheezing, rhonchi or rales.     Comments: Infrequent nonproductive cough noted on exam Musculoskeletal:        General: Normal range of motion.     Cervical back: Normal range of motion and neck supple.  Skin:    General: Skin is warm and dry.  Neurological:     General: No focal deficit present.     Mental Status: She is alert and oriented to person, place, and time. Mental status is at baseline.      UC Treatments / Results  Labs (all labs ordered are listed, but only abnormal results are displayed) Labs Reviewed - No data to display  EKG   Radiology No results found.  Procedures Procedures (including critical care time)  Medications Ordered in UC Medications - No data to display  Initial Impression / Assessment and Plan / UC Course  I have reviewed the triage vital signs and the nursing notes.  Pertinent labs & imaging results that were available during my care of the patient were reviewed by me and considered in my medical decision making (see chart for details).     MDM: 1.  Cough-Rx'd prednisone, Tessalon Perles; 2.  Subacute maxillary sinusitis-Rx'd Doxycycline; 3.  Allergic rhinitis-Rx'd Allegra. Advised patient to hold Xyzal while taking these medications over the next 10 days. Instructed patient to take medication as directed with food to completion.  Advised patient to take prednisone and Allegra with first dose of Doxycycline for the next 5 of 10 days.  Advised may use Allegra as needed afterwards for concurrent postnasal  drainage/drip. Advised may use Tessalon Perles daily or as needed for cough.  Encouraged patient to increase daily water intake while taking these medications.  Advised if symptoms worsen and/or unresolved please follow-up with PCP or here for further evaluation.  Patient discharged home, hemodynamically stable.  Final Clinical Impressions(s) / UC Diagnoses   Final diagnoses:  Cough, unspecified type  Subacute maxillary sinusitis  Allergic rhinitis, unspecified seasonality, unspecified trigger     Discharge Instructions      Advised patient to hold Xyzal while taking these medications over the next 10 days. Instructed patient to take medication as directed with food to completion.  Advised patient to take prednisone and Allegra with first dose of Doxycycline for the next 5 of 10 days.  Advised may use Allegra as needed afterwards for concurrent postnasal drainage/drip. Advised may use Tessalon Perles daily or as needed for cough.  Encouraged patient to increase daily water intake while taking these medications.  Advised if symptoms worsen and/or unresolved please follow-up with PCP or here for further evaluation.     ED Prescriptions     Medication Sig Dispense Auth. Provider   doxycycline (VIBRAMYCIN) 100 MG capsule Take 1 capsule (100 mg total) by mouth 2 (two) times daily for 10 days. 20 capsule Trevor Iha, FNP   predniSONE (DELTASONE) 20 MG tablet Take 3 tabs PO daily x 5 days. 15 tablet Trevor Iha, FNP   fexofenadine [facility] ALLERGY) 180 MG tablet Take 1 tablet (180 mg total) by mouth daily for 15 days. 15 tablet Trevor Iha, FNP   benzonatate [facility])  200 MG capsule Take 1 capsule (200 mg total) by mouth 3 (three) times daily as needed for up to 7 days for cough. 40 capsule Trevor Iha, FNP      PDMP not reviewed this encounter.   Trevor Iha, FNP 10/08/21 1037

## 2021-10-10 ENCOUNTER — Other Ambulatory Visit (HOSPITAL_BASED_OUTPATIENT_CLINIC_OR_DEPARTMENT_OTHER): Payer: Self-pay

## 2021-10-11 ENCOUNTER — Ambulatory Visit: Payer: Federal, State, Local not specified - PPO | Admitting: Sports Medicine

## 2021-10-11 DIAGNOSIS — M5412 Radiculopathy, cervical region: Secondary | ICD-10-CM | POA: Diagnosis not present

## 2021-10-11 NOTE — Assessment & Plan Note (Signed)
Pleasant 51 year old female, she has been having axial neck pain with radiation to the right periscapular region, deltoid, and bicep. She did go to urgent care not too long ago and had some x-rays done that showed C6-C7 DDD. We discussed the anatomy and pathophysiology, we will start conservatively with home conditioning, she will do this every day for 6 weeks and return to see me as needed. If insufficiently better we will consider medication +/- MRI/epidural.

## 2021-10-11 NOTE — Progress Notes (Signed)
    Procedures performed today:    None.  Independent interpretation of notes and tests performed by another provider:   None.  Brief History, Exam, Impression, and Recommendations:    Radiculitis of right cervical region Pleasant 51 year old female, she has been having axial neck pain with radiation to the right periscapular region, deltoid, and bicep. She did go to urgent care not too long ago and had some x-rays done that showed C6-C7 DDD. We discussed the anatomy and pathophysiology, we will start conservatively with home conditioning, she will do this every day for 6 weeks and return to see me as needed. If insufficiently better we will consider medication +/- MRI/epidural.    ____________________________________________ Ihor Austin. Benjamin Stain, M.D., ABFM., CAQSM., AME. Primary Care and Sports Medicine [facility]  Adjunct Professor of Family Medicine  [employer]  Restaurant manager, fast food

## 2021-10-16 ENCOUNTER — Telehealth: Payer: Federal, State, Local not specified - PPO | Admitting: Sports Medicine

## 2021-10-17 ENCOUNTER — Other Ambulatory Visit (HOSPITAL_BASED_OUTPATIENT_CLINIC_OR_DEPARTMENT_OTHER): Payer: Self-pay

## 2021-10-17 ENCOUNTER — Encounter: Payer: Self-pay | Admitting: Physician Assistant

## 2021-10-18 ENCOUNTER — Other Ambulatory Visit (HOSPITAL_BASED_OUTPATIENT_CLINIC_OR_DEPARTMENT_OTHER): Payer: Self-pay

## 2021-10-18 MED ORDER — BUTALBITAL-APAP-CAFFEINE 50-325-40 MG PO TABS
1.0000 | ORAL_TABLET | Freq: Four times a day (QID) | ORAL | 0 refills | Status: DC | PRN
Start: 2021-10-18 — End: 2021-12-25

## 2021-10-20 ENCOUNTER — Emergency Department
Admission: EM | Admit: 2021-10-20 | Discharge: 2021-10-20 | Disposition: A | Discharge status: Home / Self Care | Payer: Federal, State, Local not specified - PPO | Attending: Family Medicine | Admitting: Family Medicine

## 2021-10-20 ENCOUNTER — Emergency Department (INDEPENDENT_AMBULATORY_CARE_PROVIDER_SITE_OTHER): Payer: Federal, State, Local not specified - PPO

## 2021-10-20 ENCOUNTER — Encounter: Payer: Self-pay | Admitting: Emergency Medicine

## 2021-10-20 DIAGNOSIS — J129 Viral pneumonia, unspecified: Secondary | ICD-10-CM

## 2021-10-20 DIAGNOSIS — R0602 Shortness of breath: Secondary | ICD-10-CM

## 2021-10-20 DIAGNOSIS — R059 Cough, unspecified: Secondary | ICD-10-CM | POA: Diagnosis not present

## 2021-10-20 MED ORDER — ALBUTEROL SULFATE HFA 108 (90 BASE) MCG/ACT IN AERS
2.0000 | INHALATION_SPRAY | Freq: Once | RESPIRATORY_TRACT | Status: AC
  Administered 2021-10-20: 2 via RESPIRATORY_TRACT

## 2021-10-20 MED ORDER — DEXAMETHASONE 6 MG PO TABS: 6.0000 mg | ORAL_TABLET | Freq: Every day | ORAL | 0 refills | Status: DC

## 2021-10-20 NOTE — ED Provider Notes (Signed)
[name]    CSN: [id number] Arrival date & time: 10/20/21  1449      History   Chief Complaint Chief Complaint  Patient presents with   Shortness of Breath    HPI Jennifer Erickson is a 51 y.o. female.   HPI  Patient states she has been sick since about 4 July.  She was seen here on 10/08/2021.  She was given a course of doxycycline, 5 days of prednisone, and fexofenadine for her symptoms.  She states that these did not help resolve her cough.  She continues to have a cough, sinus pressure and pain, and now she states for the last 4 days she is becoming increasingly short of breath.  She has not done any COVID testing.  She has no known COVID exposure.  She does have underlying environmental allergies.  She states she does not have asthma but she has had wheezing with a prior case of bronchitis that required a brief course of albuterol.  She is a non-smoker.  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   Alopecia, 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   Elevated blood pressure reading 09/24/2017   Left shoulder pain 12/03/2016   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   Palpitations 07/11/2015   OSA on CPAP 07/10/2015   Large breasts 05/28/2015   Left tennis elbow 05/28/2015   Primary osteoarthritis of both knees 12/08/2014   Vitamin D deficiency 04/19/2014   Bilateral swelling of feet 05/02/2013    Left breast mass 11/22/2012   Plantar fasciitis, bilateral 11/01/2012   Insomnia 12/30/2011   Obesity, Class III, BMI 40-49.9 (morbid obesity) ([facility]) 08/19/2011   Uvular swelling 08/19/2011    Past Surgical History:  Procedure Laterality Date   DILATION AND CURETTAGE OF UTERUS     TONSILLECTOMY      OB History     Gravida  0   Para  0   Term  0   Preterm  0   AB  0   Living  0      SAB  0   IAB  0   Ectopic  0   Multiple  0   Live Births  0            Home Medications    Prior to Admission medications   Medication Sig Start Date End Date Taking? Authorizing Provider  benzonatate (TESSALON) 200 MG capsule Take 1 capsule (200 mg total) by mouth 3 (three) times daily as needed for up to 7 days for cough. 10/08/21 10/22/21 Yes Trevor Iha, FNP  butalbital-acetaminophen-caffeine (FIORICET) [id number] MG tablet Take 1-2 tablets by mouth every 6 (six) hours as needed for headache. 10/18/21 10/18/22 Yes Breeback, Jade L, PA-C  Continuous Blood Gluc Sensor (FREESTYLE LIBRE 14 DAY SENSOR) MISC APPLY TO UPPER DELTOID EVERY 14 DAYS. USE WITH READER TO DETERMINE BLOOD SUGARS 10/08/21 10/08/22 Yes Breeback, Lonna Cobb, PA-C  dexamethasone (DECADRON) 6 MG tablet Take 1 tablet (6 mg total) by mouth daily. 10/20/21  Yes Eustace Moore, MD  etonogestrel (IMPLANON) 68 MG IMPL implant Inject 1 each into the skin once.   Yes [provider]  fexofenadine (ALLEGRA ALLERGY) 180 MG tablet Take 1 tablet (180 mg total) by mouth daily for 15 days. 10/08/21 11/07/21 Yes Trevor Iha, FNP  hydroxychloroquine (PLAQUENIL) 200 MG tablet Take 1 tablet by mouth with food or milk twice a day 07/18/21  Yes   Multiple Vitamins-Minerals (ZINC PO) Take by mouth daily.   Yes [provider]  VITAMIN D PO Take by mouth.   Yes [provider]    Family History Family History  Problem Relation Age of Onset   Cancer Mother        brain   Hypertension Father    Diabetes  Brother     Social History Social History   Tobacco Use   Smoking status: Never   Smokeless tobacco: Never  Vaping Use   Vaping Use: Never used  Substance Use Topics   Alcohol use: No   Drug use: No     Allergies   Aspirin, Augmentin [amoxicillin-pot clavulanate], Ciprofloxacin, Hydrocodone, Ivp dye [iodinated contrast media], and Sulfa antibiotics   Review of Systems Review of Systems See HPI  Physical Exam Triage Vital Signs ED Triage Vitals  Enc Vitals Group     BP 10/20/21 1502 125/86     Pulse Rate 10/20/21 1502 (!) 102     Resp 10/20/21 1502 20     Temp 10/20/21 1502 99.8 F (37.7 C)     Temp Source 10/20/21 1502 Oral     SpO2 10/20/21 1502 96 %     Weight 10/20/21 1503 280 lb (127 kg)     Height 10/20/21 1503 5\' 6"  (1.676 m)     Head Circumference --      Peak Flow --      Pain Score 10/20/21 1503 0     Pain Loc --      Pain Edu? --      Excl. in GC? --    No data found.  Updated Vital Signs BP 125/86 (BP Location: Right Arm)   Pulse (!) 102   Temp 99.8 F (37.7 C) (Oral)   Resp 20   Ht 5\' 6"  (1.676 m)   Wt 127 kg   SpO2 96%   BMI 45.19 kg/m       Physical Exam Constitutional:      General: She is not in acute distress.    Appearance: She is well-developed. She is obese. She is ill-appearing.  HENT:     Head: Normocephalic and atraumatic.     Right Ear: Tympanic membrane and ear canal normal.     Left Ear: Tympanic membrane normal.     Nose: Congestion present.     Comments: Tender predominantly left ethmoid sinus nasal membranes congested and red    Mouth/Throat:     Mouth: Mucous membranes are moist.     Pharynx: No posterior oropharyngeal erythema.  Eyes:     Conjunctiva/sclera: Conjunctivae normal.     Pupils: Pupils are equal, round, and reactive to light.  Cardiovascular:     Rate and Rhythm: Regular rhythm. Tachycardia present.     Heart sounds: Normal heart sounds.  Pulmonary:     Effort: Pulmonary effort is normal. No  respiratory distress.     Breath sounds: Rhonchi present. No wheezing.  Comments: Slightly diminished breath sounds Abdominal:     General: There is no distension.     Palpations: Abdomen is soft.  Musculoskeletal:        General: Normal range of motion.     Cervical back: Normal range of motion.  Lymphadenopathy:     Cervical: No cervical adenopathy.  Skin:    General: Skin is warm and dry.  Neurological:     Mental Status: She is alert.  Psychiatric:        Mood and Affect: Mood normal.        Behavior: Behavior normal.      UC Treatments / Results  Labs (all labs ordered are listed, but only abnormal results are displayed) Labs Reviewed  COVID-19, FLU A+B AND RSV    EKG   Radiology DG Chest 2 View  Result Date: 10/20/2021 CLINICAL DATA:  Cough EXAM: CHEST - 2 VIEW COMPARISON:  01/28/2020 FINDINGS: The heart size and mediastinal contours are within normal limits. Mild, diffuse bilateral interstitial pulmonary opacity. The visualized skeletal structures are unremarkable. IMPRESSION: Mild, diffuse bilateral interstitial pulmonary opacity, which may reflect edema or atypical/viral infection. No focal airspace opacity. Electronically Signed   By: Jearld Lesch M.D.   On: 10/20/2021 15:41    Procedures Procedures (including critical care time)  Medications Ordered in UC Medications  albuterol (VENTOLIN HFA) 108 (90 Base) MCG/ACT inhaler 2 puff (2 puffs Inhalation Given 10/20/21 1525)    Initial Impression / Assessment and Plan / UC Course  I have reviewed the triage vital signs and the nursing notes.  Pertinent labs & imaging results that were available during my care of the patient were reviewed by me and considered in my medical decision making (see chart for details).     After albuterol inhaler patient reports decreased shortness of breath, minimally. I did discuss the chest x-ray with her.  She has bilateral infiltrates consistent with atypical viral  infection.  She is already had a course of 10 days of doxycycline with no improvement.  I believe this is likely viral, and I am worried that it may actually be an aftermath of COVID.  She has not had COVID testing during this almost month-long illness.  I am going to send a COVID test to the laboratory.  I told her it is too late to treat her with antiviral medicines but I will give her another course of steroid.  She needs to call her primary care doctor tomorrow to see about additional testing such as CT scan or PFT to evaluate her lung status.  Final Clinical Impressions(s) / UC Diagnoses   Final diagnoses:  SOB (shortness of breath)  Pneumonia, viral     Discharge Instructions      Drink lots of fluids We have drawn blood work for COVID test , you will be called if it is positive Continue cough medicine as needed Run a humidifier in your bedroom if you have 1 Call your primary care doctor tomorrow to inquire about additional testing     ED Prescriptions     Medication Sig Dispense Auth. Provider   dexamethasone (DECADRON) 6 MG tablet Take 1 tablet (6 mg total) by mouth daily. 5 tablet Eustace Moore, MD      PDMP not reviewed this encounter.   Eustace Moore, MD 10/20/21 [phone]

## 2021-10-20 NOTE — ED Triage Notes (Signed)
Patient c/o SOB x 4 days, noticed more recently, has had a sinus infection x 1 month.  Noticed more when going up and down stairs, walking short distances, her breathing becomes more labored.  No history of asthma.

## 2021-10-20 NOTE — Discharge Instructions (Addendum)
Drink lots of fluids We have drawn blood work for COVID test , you will be called if it is positive Continue cough medicine as needed Run a humidifier in your bedroom if you have 1 Call your primary care doctor tomorrow to inquire about additional testing

## 2021-10-21 ENCOUNTER — Other Ambulatory Visit (HOSPITAL_BASED_OUTPATIENT_CLINIC_OR_DEPARTMENT_OTHER): Payer: Self-pay

## 2021-10-22 LAB — COVID-19, FLU A+B AND RSV
Influenza A, NAA: NOT DETECTED
Influenza B, NAA: NOT DETECTED
RSV, NAA: NOT DETECTED
SARS-CoV-2, NAA: NOT DETECTED

## 2021-10-31 NOTE — Progress Notes (Signed)
Acute Office Visit  Subjective:     Patient ID: Jennifer Erickson, female    DOB: [birth year], 51 y.o.   MRN: [id number]  Chief Complaint  Patient presents with   Follow-up    Pt presents for sick visit today. She says she has not been feeling well and has been unable to taste food. She was seen at urgent care a few weeks ago. CXR was performed at that time that showed viral infection. Physician had high suspicion this is an aftermath of COVID. However, pt was not tested when symptoms first occurred. They did test at urgent care after 3 weeks of symptoms and testing was negative for both COVID and flu. She was given an albuterol treatment and steroids. She had improvement with albuterol. She improved with tesslon perles. She is unsure if the steroids helped her.   Today, pt is feeling ok today. She says she doesn't feel worse, but does not feel better. She made the appointment because co-workers said that she "looked terrible." Denies shortness of breath--this has much improved. She does still have a cough. She had to use her inhaler a few days ago due to coughing. She says everyone has covid. She works at the [employer] and says everyone is sick. She has been taking guanfenasin and it has been helping.    Review of Systems  Constitutional:  Negative for chills and fever.  Respiratory:  Positive for cough. Negative for shortness of breath.   Cardiovascular:  Negative for chest pain.  Neurological:  Negative for headaches.        Objective:    BP 116/85   Pulse 89   Ht 5\' 6"  (1.676 m)   Wt 287 lb (130.2 kg)   BMI 46.32 kg/m    Physical Exam Vitals and nursing note reviewed.  Constitutional:      General: She is not in acute distress.    Appearance: Normal appearance.  HENT:     Head: Normocephalic and atraumatic.     Right Ear: External ear normal.     Left Ear: External ear normal.     Nose: Nose normal.  Eyes:     Conjunctiva/sclera: Conjunctivae normal.   Cardiovascular:     Rate and Rhythm: Normal rate and regular rhythm.  Pulmonary:     Effort: Pulmonary effort is normal.     Breath sounds: Normal breath sounds.  Neurological:     General: No focal deficit present.     Mental Status: She is alert and oriented to person, place, and time.  Psychiatric:        Mood and Affect: Mood normal.        Behavior: Behavior normal.        Thought Content: Thought content normal.        Judgment: Judgment normal.     No results found for any visits on 11/01/21.      Assessment & Plan:   Problem List Items Addressed This Visit       Other   Cough - Primary    Given tesslon perles and albuterol  - likely this is from viral infection - if she starts running fevers she knows to come back to get tested for flu and covid  - lungs were completely clear on exam--no wheezing, crackles etc I anticipate her viral pneumonia diagnosed by urgent care has cleared       Viral infection    - discussed how these viral infections can  last a little bit especially if she is being reinfected at work  - had negative flu and covid testing two weeks ago with no change in symptoms. Reviewed in epic urgent care visits and labs  - if worsens come back for testing  - recommend rest, fluids  - increase immune boosters like elderberry, vit c        Meds ordered this encounter  Medications   benzonatate (TESSALON) 100 MG capsule    Sig: Take 1 capsule (100 mg total) by mouth 2 (two) times daily as needed for cough.    Dispense:  20 capsule    Refill:  0   albuterol (VENTOLIN HFA) 108 (90 Base) MCG/ACT inhaler    Sig: Inhale 2 puffs into the lungs every 6 (six) hours as needed for wheezing or shortness of breath.    Dispense:  6.7 g    Refill:  0    Return if symptoms worsen or fail to improve.  Charlton Amor, DO

## 2021-11-01 ENCOUNTER — Ambulatory Visit: Payer: Federal, State, Local not specified - PPO | Admitting: Family Medicine

## 2021-11-01 ENCOUNTER — Other Ambulatory Visit (HOSPITAL_BASED_OUTPATIENT_CLINIC_OR_DEPARTMENT_OTHER): Payer: Self-pay

## 2021-11-01 VITALS — BP 116/85 | HR 89 | Ht 66.0 in | Wt 287.0 lb

## 2021-11-01 DIAGNOSIS — R058 Other specified cough: Secondary | ICD-10-CM | POA: Diagnosis not present

## 2021-11-01 DIAGNOSIS — B349 Viral infection, unspecified: Secondary | ICD-10-CM | POA: Diagnosis not present

## 2021-11-01 MED ORDER — ALBUTEROL SULFATE HFA 108 (90 BASE) MCG/ACT IN AERS
2.0000 | INHALATION_SPRAY | Freq: Four times a day (QID) | RESPIRATORY_TRACT | 0 refills | Status: DC | PRN
  Filled 2021-11-01: qty 6.7, 30d supply, fill #0

## 2021-11-01 MED ORDER — BENZONATATE 100 MG PO CAPS
100.0000 mg | ORAL_CAPSULE | Freq: Two times a day (BID) | ORAL | 0 refills | Status: DC | PRN
  Filled 2021-11-01: qty 20, 10d supply, fill #0

## 2021-11-01 NOTE — Assessment & Plan Note (Signed)
Given tesslon perles and albuterol  - likely this is from viral infection - if she starts running fevers she knows to come back to get tested for flu and covid  - lungs were completely clear on exam--no wheezing, crackles etc I anticipate her viral pneumonia diagnosed by urgent care has cleared

## 2021-11-01 NOTE — Patient Instructions (Signed)
-   get plenty of rest and drink fluids - boost your immune system with elderberry and vit c  - pick up your albuterol and tesslon perles  - if you start running fevers come back for testing

## 2021-11-01 NOTE — Assessment & Plan Note (Addendum)
-   discussed how these viral infections can last a little bit especially if she is being reinfected at work  - had negative flu and covid testing two weeks ago with no change in symptoms. Reviewed in epic urgent care visits and labs  - if worsens come back for testing  - recommend rest, fluids  - increase immune boosters like elderberry, vit c

## 2021-11-11 ENCOUNTER — Encounter: Payer: Self-pay | Admitting: Physician Assistant

## 2021-11-11 DIAGNOSIS — R051 Acute cough: Secondary | ICD-10-CM

## 2021-11-11 DIAGNOSIS — R0781 Pleurodynia: Secondary | ICD-10-CM

## 2021-11-12 ENCOUNTER — Other Ambulatory Visit (HOSPITAL_BASED_OUTPATIENT_CLINIC_OR_DEPARTMENT_OTHER): Payer: Self-pay

## 2021-11-12 MED ORDER — EPINEPHRINE 0.3 MG/0.3ML IJ SOAJ
0.3000 mg | Freq: Once | INTRAMUSCULAR | 1 refills | Status: AC
  Filled 2021-11-12: qty 2, 2d supply, fill #0

## 2021-11-12 NOTE — Addendum Note (Signed)
Addended by: Jomarie Longs on: 11/12/2021 03:36 PM   Modules accepted: Orders

## 2021-11-18 ENCOUNTER — Ambulatory Visit (INDEPENDENT_AMBULATORY_CARE_PROVIDER_SITE_OTHER): Payer: Federal, State, Local not specified - PPO

## 2021-11-18 DIAGNOSIS — R051 Acute cough: Secondary | ICD-10-CM | POA: Diagnosis not present

## 2021-11-18 DIAGNOSIS — S299XXA Unspecified injury of thorax, initial encounter: Secondary | ICD-10-CM | POA: Diagnosis not present

## 2021-11-18 DIAGNOSIS — R0781 Pleurodynia: Secondary | ICD-10-CM | POA: Diagnosis not present

## 2021-11-18 DIAGNOSIS — R059 Cough, unspecified: Secondary | ICD-10-CM | POA: Diagnosis not present

## 2021-11-18 DIAGNOSIS — M47814 Spondylosis without myelopathy or radiculopathy, thoracic region: Secondary | ICD-10-CM | POA: Diagnosis not present

## 2021-11-18 DIAGNOSIS — M19011 Primary osteoarthritis, right shoulder: Secondary | ICD-10-CM | POA: Diagnosis not present

## 2021-11-19 ENCOUNTER — Other Ambulatory Visit (HOSPITAL_BASED_OUTPATIENT_CLINIC_OR_DEPARTMENT_OTHER): Payer: Self-pay

## 2021-11-19 NOTE — Progress Notes (Signed)
No acute changes. Keep deep breathing to help move air throughout all of lungs. Do you have an incentive spirometer that you can use?

## 2021-12-24 ENCOUNTER — Other Ambulatory Visit: Payer: Self-pay | Admitting: *Deleted

## 2021-12-24 ENCOUNTER — Ambulatory Visit: Payer: Federal, State, Local not specified - PPO | Admitting: Obstetrics & Gynecology

## 2021-12-24 DIAGNOSIS — Z30017 Encounter for initial prescription of implantable subdermal contraceptive: Secondary | ICD-10-CM

## 2021-12-25 ENCOUNTER — Other Ambulatory Visit (HOSPITAL_BASED_OUTPATIENT_CLINIC_OR_DEPARTMENT_OTHER): Payer: Self-pay

## 2021-12-25 ENCOUNTER — Ambulatory Visit: Payer: Federal, State, Local not specified - PPO | Admitting: Obstetrics & Gynecology

## 2021-12-25 ENCOUNTER — Encounter: Payer: Self-pay | Admitting: Obstetrics & Gynecology

## 2021-12-25 VITALS — BP 114/78 | HR 86

## 2021-12-25 DIAGNOSIS — R82998 Other abnormal findings in urine: Secondary | ICD-10-CM

## 2021-12-25 DIAGNOSIS — Z3046 Encounter for surveillance of implantable subdermal contraceptive: Secondary | ICD-10-CM

## 2021-12-25 DIAGNOSIS — Z30017 Encounter for initial prescription of implantable subdermal contraceptive: Secondary | ICD-10-CM

## 2021-12-25 DIAGNOSIS — Z01812 Encounter for preprocedural laboratory examination: Secondary | ICD-10-CM | POA: Diagnosis not present

## 2021-12-25 DIAGNOSIS — Z87440 Personal history of urinary (tract) infections: Secondary | ICD-10-CM

## 2021-12-25 LAB — PREGNANCY, URINE: Preg Test, Ur: NEGATIVE

## 2021-12-25 MED ORDER — CIPROFLOXACIN HCL 500 MG PO TABS
500.0000 mg | ORAL_TABLET | Freq: Two times a day (BID) | ORAL | 0 refills | Status: AC
  Filled 2021-12-25: qty 14, 7d supply, fill #0

## 2021-12-25 NOTE — Progress Notes (Signed)
Jennifer Erickson May 09, 1970 [id number]        51 y.o.  G0P0000 Widowed.  RP: Removal/Insertion of Nexplanon  HPI: Time to change Nexplanon after 3 years.  Doing very well on it.  No BTB.  No pelvic pain.  Has about 5 Cystitis per year, usually asymptomatic.  No current UTI Sx.  No fever.   OB History  Gravida Para Term Preterm AB Living  0 0 0 0 0 0  SAB IAB Ectopic Multiple Live Births  0 0 0 0 0    Past medical history,surgical history, problem list, medications, allergies, family history and social history were all reviewed and documented in the EPIC chart.   Directed ROS with pertinent positives and negatives documented in the history of present illness/assessment and plan.  Exam:  Vitals:   12/25/21 1105  BP: 114/78  Pulse: 86  SpO2: 98%   General appearance:  Normal                                                             Nexplanon procedure note (removal)  The patient presented to the office today requesting for removal of her Nexplanon that was placed in the year 11/2018 on her Left arm.   On examination the nexplanon implant was palpated and the distal end  (end  closest to the elbow) was marked. The area was sterilized with Betadine solution. 1% lidocaine was used for local anesthesia and approximately 1 cc  was injected into the site that was marked where the incision was to be made. The local anesthetic was injected under the implant in an effort to keep it  close to the skin surface. Slight pressure pushing downward was made at the proximal end  of the implant in an effort to stabilize it. A bulge appeared indicating the distal end of the implant. A small transverse incision of 2 mm was made at that location. By gently pushing the implant toward the incision, the tip became visible. Grasping the implant with a curved forcep facilitated in gently removing the implant. Full confirmation of the entire implant which is 4 cm long was inspected and was intact and  was shown to the patient and discarded.                                                                                                            Nexplanon Procedure Note (insertion)  The preloaded disposable Nexplanon was removed from its sterile casing.  The applicator was held above the needle at the textured surface area. The transparent protector was removed. With a freehand, the skin was stretched around the insertion site with a thumb and index finger. The tip of the needle angled at 30 and inserted at the small incision just made for the removal of the previous Nexplanon. The Nexplanon applicator  was lowered to a horizontal position. While lifting the skin with the tip of the needle the needle was then slid to its full length. The applicator was kept in this position with the needle inserted to its full length. The purple slider was unlocked by pushing it slightly downward. The slider was fully moved back until it stopped. This allowed the implant to be in the final subdermal position and the needle to be locked inside the body of the applicator. The applicator was then removed. 3 Steri-Strips were applied over the incision, a band-aid and a bandage was placed which the patient is to remove tomorrow. No complications, the patient tolerated procedure well and was released home with instructions.  UPT Neg U/A Dark yellow, cloudy, pro 1+, Nit Positive, WBC 10-20, RBC 3-10, Bacteria Many.  U. Culture pending.   Assessment/Plan:  51 y.o. G0  1. Encounter for Nexplanon removal Time to change Nexplanon after 3 years.  Doing very well on it.  No BTB.  No pelvic pain.  Easy removal, no Cx. Well tolerated.  2. Nexplanon insertion New Nexplanon inserted without Cx. Well tolerated. Post procedure precautions reviewed.   - Insertion of implanon rod  3. Dark urine Abnormal U/A.  Treat with Ciprofloxacin 500 mg PO BID x 7 days.  H/O intolerance only, patient agrees to take.  Push water intake.   Pending U. Culture.   - Urinalysis,Complete w/RFL Culture  4. History of recurrent cystitis Has about 5 Cystitis per year, usually asymptomatic.  No current UTI Sx.  No fever.  U/A severely abnormal, c/w acute cystitis.  Will treat with Ciprofloxacin.  No allergy.  H/O N/V, but patient said that she had a viral URTI going on at the time of taking Cipro with N/V in the past.  Usage reviewed, prescription sent to pharmacy.  Pending U. Culture.  Refer to Urology for investigation and management of recurrent cystitis.  5. Encounter for preprocedural laboratory examination UPT Neg - Pregnancy, urine  Other orders - Urine Culture - REFLEXIVE URINE CULTURE - ciprofloxacin (CIPRO) 500 MG tablet; Take 1 tablet (500 mg total) by mouth 2 (two) times daily for 7 days.   Princess Bruins MD, 11:21 AM 12/25/2021

## 2021-12-25 NOTE — Addendum Note (Signed)
Addended by: Susy Manor on: 12/25/2021 11:57 AM   Modules accepted: Orders

## 2021-12-26 ENCOUNTER — Telehealth: Payer: Self-pay

## 2021-12-26 DIAGNOSIS — Z87440 Personal history of urinary (tract) infections: Secondary | ICD-10-CM

## 2021-12-26 NOTE — Telephone Encounter (Signed)
-----   Message from Princess Bruins, MD sent at 12/25/2021 11:42 AM EDT ----- Regarding: Refer to Urology Recurrent asymptomatic acute cystitis, about 5 per year.  Refer to Urology for investigation and management.

## 2021-12-26 NOTE — Telephone Encounter (Signed)
Will wait on urine culture results to send complete referral.

## 2021-12-27 ENCOUNTER — Other Ambulatory Visit (HOSPITAL_BASED_OUTPATIENT_CLINIC_OR_DEPARTMENT_OTHER): Payer: Self-pay

## 2021-12-28 LAB — URINE CULTURE
MICRO NUMBER:: 13975292
SPECIMEN QUALITY:: ADEQUATE

## 2021-12-28 LAB — URINALYSIS, COMPLETE W/RFL CULTURE
Bilirubin Urine: NEGATIVE
Glucose, UA: NEGATIVE
Ketones, ur: NEGATIVE
Nitrites, Initial: POSITIVE — AB
Specific Gravity, Urine: 1.02 (ref 1.001–1.035)
pH: 5.5 (ref 5.0–8.0)

## 2021-12-28 LAB — CULTURE INDICATED

## 2021-12-30 NOTE — Telephone Encounter (Signed)
Fax sent successfully.

## 2021-12-31 ENCOUNTER — Encounter: Payer: Self-pay | Admitting: Sports Medicine

## 2021-12-31 NOTE — Telephone Encounter (Signed)
Please contact Jennifer Erickson to see if she can get this patient fitted for a custom OA unloader brace, she will need a custom brace due to the size and shape of her thigh.  She is ambulatory and a custom brace will improve mobility.

## 2021-12-31 NOTE — Telephone Encounter (Signed)
Email sent to [facility] regarding the need for a custom brace.

## 2022-01-01 NOTE — Telephone Encounter (Signed)
Per Maudie Mercury @ [facility]- 10/2- left msg for pt to call back.

## 2022-01-03 ENCOUNTER — Ambulatory Visit: Payer: Federal, State, Local not specified - PPO | Admitting: Surgical

## 2022-01-08 ENCOUNTER — Other Ambulatory Visit (HOSPITAL_BASED_OUTPATIENT_CLINIC_OR_DEPARTMENT_OTHER): Payer: Self-pay

## 2022-01-08 MED ORDER — FLUARIX QUADRIVALENT 0.5 ML IM SUSY
PREFILLED_SYRINGE | INTRAMUSCULAR | 0 refills | Status: DC
  Filled 2022-01-08: qty 0.5, 1d supply, fill #0

## 2022-01-15 ENCOUNTER — Encounter: Payer: Self-pay | Admitting: Sports Medicine

## 2022-01-15 ENCOUNTER — Ambulatory Visit: Payer: Federal, State, Local not specified - PPO | Admitting: Sports Medicine

## 2022-01-15 DIAGNOSIS — M170 Bilateral primary osteoarthritis of knee: Secondary | ICD-10-CM | POA: Diagnosis not present

## 2022-01-15 NOTE — Assessment & Plan Note (Signed)
This is a very pleasant 51 year old female with known bilateral right worse than left knee osteoarthritis, she has had multiple treatments including oral analgesics, injections. We are interested in getting her a custom knee brace. I performed an exam today, she is ambulatory, she has full range of motion, slight valgus deformity, her body habitus and thigh shape and size would preclude use of a regular knee brace and she would need a custom brace. She is already been in touch with our orthopedic rep and can get back in touch with her for fitting. She will have an improvement in her activity level, balance, function and quality of life with the brace.

## 2022-01-15 NOTE — Progress Notes (Signed)
    Procedures performed today:    None.  Independent interpretation of notes and tests performed by another provider:   None.  Brief History, Exam, Impression, and Recommendations:    Primary osteoarthritis of both knees This is a very pleasant 51 year old female with known bilateral right worse than left knee osteoarthritis, she has had multiple treatments including oral analgesics, injections. We are interested in getting her a custom knee brace. I performed an exam today, she is ambulatory, she has full range of motion, slight valgus deformity, her body habitus and thigh shape and size would preclude use of a regular knee brace and she would need a custom brace. She is already been in touch with our orthopedic rep and can get back in touch with her for fitting. She will have an improvement in her activity level, balance, function and quality of life with the brace.    ____________________________________________ Gwen Her. Dianah Field, M.D., ABFM., CAQSM., AME. Primary Care and Sports Medicine [facility]  Adjunct Professor of [employer]  Risk manager

## 2022-01-24 NOTE — Telephone Encounter (Signed)
Called pt to see if she has gotten her appt scheduled w/ urology yet. She said not yet, but plans to. I asked if she wouldn't mind calling us to let us know once she does. She said she would.

## 2022-01-30 ENCOUNTER — Other Ambulatory Visit (HOSPITAL_BASED_OUTPATIENT_CLINIC_OR_DEPARTMENT_OTHER): Payer: Self-pay

## 2022-01-30 DIAGNOSIS — Z79899 Other long term (current) drug therapy: Secondary | ICD-10-CM | POA: Diagnosis not present

## 2022-01-30 DIAGNOSIS — G4733 Obstructive sleep apnea (adult) (pediatric): Secondary | ICD-10-CM | POA: Diagnosis not present

## 2022-01-30 DIAGNOSIS — L930 Discoid lupus erythematosus: Secondary | ICD-10-CM | POA: Diagnosis not present

## 2022-01-30 MED ORDER — HYDROXYCHLOROQUINE SULFATE 200 MG PO TABS
200.0000 mg | ORAL_TABLET | Freq: Two times a day (BID) | ORAL | 3 refills | Status: DC
  Filled 2022-01-30: qty 180, 90d supply, fill #0
  Filled 2022-05-15: qty 180, 90d supply, fill #1
  Filled 2022-08-13: qty 180, 90d supply, fill #2

## 2022-02-11 ENCOUNTER — Other Ambulatory Visit (HOSPITAL_BASED_OUTPATIENT_CLINIC_OR_DEPARTMENT_OTHER): Payer: Self-pay

## 2022-02-13 ENCOUNTER — Other Ambulatory Visit (HOSPITAL_BASED_OUTPATIENT_CLINIC_OR_DEPARTMENT_OTHER): Payer: Self-pay

## 2022-02-22 ENCOUNTER — Encounter: Payer: Self-pay | Admitting: Sports Medicine

## 2022-02-22 DIAGNOSIS — M170 Bilateral primary osteoarthritis of knee: Secondary | ICD-10-CM

## 2022-02-24 NOTE — Telephone Encounter (Signed)
Hey Weston Brass would you please call Rayfield Citizen with [employer] to make sure that she is all set to get her custom knee brace?

## 2022-02-24 NOTE — Telephone Encounter (Signed)
I spent 5 total minutes of online digital evaluation and management services in this patient-initiated request for online care. 

## 2022-03-05 DIAGNOSIS — N3021 Other chronic cystitis with hematuria: Secondary | ICD-10-CM | POA: Diagnosis not present

## 2022-03-05 DIAGNOSIS — R8271 Bacteriuria: Secondary | ICD-10-CM | POA: Diagnosis not present

## 2022-03-11 ENCOUNTER — Other Ambulatory Visit (HOSPITAL_BASED_OUTPATIENT_CLINIC_OR_DEPARTMENT_OTHER): Payer: Self-pay

## 2022-03-21 ENCOUNTER — Encounter: Payer: Self-pay | Admitting: Sports Medicine

## 2022-05-08 NOTE — Telephone Encounter (Signed)
Spoke w/ pt and she reports that she has been seen by uro and that she is currently taking an OTC supplement that seems to be helping with her sxs so far. Will close encounter.

## 2022-05-15 ENCOUNTER — Other Ambulatory Visit (HOSPITAL_BASED_OUTPATIENT_CLINIC_OR_DEPARTMENT_OTHER): Payer: Self-pay

## 2022-05-15 MED ORDER — COVID-19 MRNA VAC-TRIS(PFIZER) 30 MCG/0.3ML IM SUSY
0.3000 mL | PREFILLED_SYRINGE | Freq: Once | INTRAMUSCULAR | 0 refills | Status: AC
  Filled 2022-05-15: qty 0.3, 1d supply, fill #0

## 2022-05-21 ENCOUNTER — Other Ambulatory Visit (HOSPITAL_BASED_OUTPATIENT_CLINIC_OR_DEPARTMENT_OTHER): Payer: Self-pay

## 2022-05-21 ENCOUNTER — Telehealth (INDEPENDENT_AMBULATORY_CARE_PROVIDER_SITE_OTHER): Payer: Federal, State, Local not specified - PPO | Admitting: Medical-Surgical

## 2022-05-21 ENCOUNTER — Encounter: Payer: Self-pay | Admitting: Medical-Surgical

## 2022-05-21 DIAGNOSIS — R0989 Other specified symptoms and signs involving the circulatory and respiratory systems: Secondary | ICD-10-CM

## 2022-05-21 DIAGNOSIS — R059 Cough, unspecified: Secondary | ICD-10-CM

## 2022-05-21 DIAGNOSIS — R519 Headache, unspecified: Secondary | ICD-10-CM

## 2022-05-21 LAB — POCT INFLUENZA A/B
Influenza A, POC: NEGATIVE
Influenza B, POC: NEGATIVE

## 2022-05-21 LAB — POC COVID19 BINAXNOW: SARS Coronavirus 2 Ag: NEGATIVE

## 2022-05-21 MED ORDER — PREDNISONE 50 MG PO TABS
50.0000 mg | ORAL_TABLET | Freq: Every day | ORAL | 0 refills | Status: DC
  Filled 2022-05-21: qty 5, 5d supply, fill #0

## 2022-05-21 MED ORDER — BENZONATATE 200 MG PO CAPS
200.0000 mg | ORAL_CAPSULE | Freq: Three times a day (TID) | ORAL | 0 refills | Status: DC | PRN
  Filled 2022-05-21: qty 45, 15d supply, fill #0

## 2022-05-21 NOTE — Progress Notes (Signed)
Virtual Visit via Video Note  I connected with Jennifer Erickson on 05/21/22 at 11:10 AM EST by a video enabled telemedicine application and verified that I am speaking with the correct person using two identifiers.   I discussed the limitations of evaluation and management by telemedicine and the availability of in person appointments. The patient expressed understanding and agreed to proceed.  Patient location: home Provider locations: office  Subjective:    CC: Respiratory symptoms  HPI: Pleasant 52 year old female presenting via MyChart video visit with reports of 4 days of severe headache, sinus congestion, mild sore throat, and cough.  Notes the cough started yesterday and she has been coughing up thick brown sputum.  Has tried taking Sudafed and notes that it works for couple of hours however it gives out shortly after that.  She has used some leftover Tessalon Perles that she had at home and has been drinking hot lemon water for the sore throat.  Reports that she cannot take liquid cough syrup.  On the same day that her symptoms started, she went to [facility] and was tested for flu and COVID and reports both were negative.  Has not retested since then.  Past medical history, Surgical history, Family history not pertinant except as noted below, Social history, Allergies, and medications have been entered into the medical record, reviewed, and corrections made.   Review of Systems: See HPI for pertinent positives and negatives.   Objective:    General: Speaking clearly in complete sentences without any shortness of breath.  Alert and oriented x3.  Normal judgment. No apparent acute distress.  Impression and Recommendations:    1. Respiratory symptoms After only 4 days of symptoms, feel that this is viral in nature.  She tested on the first day of symptoms which may have been too early.  Having her come by for a drive up test for both flu and COVID for confirmation.  Discussed  symptomatic treatment.  Refilling Tessalon Perles.  Although it is early in her symptoms, her headache is very severe and not being helped by over-the-counter options.  Agreeable to do a burst of prednisone to see if this will help the severity of her symptoms.  Okay to continue using over-the-counter cold/flu preparations for symptom management.  Tylenol/ibuprofen as needed for headache.  Update: Flu and COVID testing negative. Continue with the above plan as likely a viral URI.   I discussed the assessment and treatment plan with the patient. The patient was provided an opportunity to ask questions and all were answered. The patient agreed with the plan and demonstrated an understanding of the instructions.   The patient was advised to call back or seek an in-person evaluation if the symptoms worsen or if the condition fails to improve as anticipated.  25 minutes of non-face-to-face time was provided during this encounter.  Return if symptoms worsen or fail to improve.  Clearnce Sorrel, DNP, APRN, FNP-BC [facility]

## 2022-05-25 ENCOUNTER — Encounter: Payer: Self-pay | Admitting: Medical-Surgical

## 2022-05-27 ENCOUNTER — Ambulatory Visit: Payer: Federal, State, Local not specified - PPO | Admitting: Family Medicine

## 2022-05-27 ENCOUNTER — Encounter: Payer: Self-pay | Admitting: Family Medicine

## 2022-05-27 VITALS — BP 134/72 | HR 94 | Temp 98.1°F | Ht 66.0 in | Wt 291.0 lb

## 2022-05-27 DIAGNOSIS — R059 Cough, unspecified: Secondary | ICD-10-CM

## 2022-05-27 LAB — POCT INFLUENZA A/B
Influenza A, POC: NEGATIVE
Influenza B, POC: NEGATIVE

## 2022-05-27 LAB — POC COVID19 BINAXNOW: SARS Coronavirus 2 Ag: NEGATIVE

## 2022-05-27 NOTE — Progress Notes (Signed)
   Acute Office Visit  Subjective:     Patient ID: Jennifer Erickson, female    DOB: Nov 15, 1970, 52 y.o.   MRN: [id number]  Chief Complaint  Patient presents with   Cough   Chills    HPI Patient is in today for 8 days of cough sinus pressure and congestion and headaches.  She says the sinus symptoms have gradually improved she has been using some Sudafed though not in the last couple days.  Cough has been persistent though which is why she is coming today she has been spitting out mucus that looks like slugs.  ROS      Objective:    BP 134/72   Pulse 94   Temp 98.1 F (36.7 C)   Ht 5' 6"$  (1.676 m)   Wt 291 lb (132 kg)   SpO2 100%   BMI 46.97 kg/m    Physical Exam Constitutional:      Appearance: She is well-developed.  HENT:     Head: Normocephalic and atraumatic.     Right Ear: Tympanic membrane, ear canal and external ear normal.     Left Ear: Tympanic membrane, ear canal and external ear normal.     Nose: Nose normal.     Mouth/Throat:     Pharynx: Oropharynx is clear.  Eyes:     Conjunctiva/sclera: Conjunctivae normal.     Pupils: Pupils are equal, round, and reactive to light.  Neck:     Thyroid: No thyromegaly.  Cardiovascular:     Rate and Rhythm: Normal rate and regular rhythm.     Heart sounds: Normal heart sounds.  Pulmonary:     Effort: Pulmonary effort is normal.     Breath sounds: Normal breath sounds. No wheezing.  Musculoskeletal:     Cervical back: Neck supple.  Lymphadenopathy:     Cervical: No cervical adenopathy.  Skin:    General: Skin is warm and dry.  Neurological:     Mental Status: She is alert and oriented to person, place, and time.     Results for orders placed or performed in visit on 05/27/22  POC COVID-19  Result Value Ref Range   SARS Coronavirus 2 Ag Negative Negative  POCT Influenza A/B  Result Value Ref Range   Influenza A, POC Negative Negative   Influenza B, POC Negative Negative        Assessment &  Plan:   Problem List Items Addressed This Visit       Other   Cough - Primary   Relevant Orders   POC COVID-19 (Completed)   POCT Influenza A/B (Completed)   Cough 8 days-negative for COVID and for flu..  It sounds like the sinus symptoms are improving.  Chest is clear on exam today.  She just finished up a round of prednisone.  We discussed giving it a few more days to see if he she continues to improve.  If she develops new or worsening symptoms or increased shortness of breath or fatigue then please let us know and we will treat with an antibiotic at that point.  Pulse ox is 100%.  No orders of the defined types were placed in this encounter.   Return if symptoms worsen or fail to improve.  Beatrice Lecher, MD

## 2022-06-03 ENCOUNTER — Other Ambulatory Visit: Payer: Self-pay

## 2022-06-03 ENCOUNTER — Telehealth: Payer: Federal, State, Local not specified - PPO | Admitting: Family Medicine

## 2022-06-03 ENCOUNTER — Other Ambulatory Visit (HOSPITAL_BASED_OUTPATIENT_CLINIC_OR_DEPARTMENT_OTHER): Payer: Self-pay

## 2022-06-03 DIAGNOSIS — R052 Subacute cough: Secondary | ICD-10-CM

## 2022-06-03 MED ORDER — PSEUDOEPH-BROMPHEN-DM 30-2-10 MG/5ML PO SYRP
5.0000 mL | ORAL_SOLUTION | Freq: Four times a day (QID) | ORAL | 0 refills | Status: DC | PRN
  Filled 2022-06-03: qty 120, 6d supply, fill #0

## 2022-06-03 MED ORDER — ALBUTEROL SULFATE HFA 108 (90 BASE) MCG/ACT IN AERS
1.0000 | INHALATION_SPRAY | Freq: Four times a day (QID) | RESPIRATORY_TRACT | 0 refills | Status: DC | PRN
  Filled 2022-06-03: qty 6.7, 25d supply, fill #0

## 2022-06-03 MED ORDER — CETIRIZINE HCL 10 MG PO TABS
10.0000 mg | ORAL_TABLET | Freq: Every day | ORAL | 0 refills | Status: DC
  Filled 2022-06-03: qty 100, 100d supply, fill #0

## 2022-06-03 MED ORDER — FLUTICASONE PROPIONATE 50 MCG/ACT NA SUSP
2.0000 | Freq: Every day | NASAL | 0 refills | Status: DC
  Filled 2022-06-03: qty 16, 30d supply, fill #0

## 2022-06-03 NOTE — Progress Notes (Signed)
Virtual Visit Consent   Jennifer Erickson, you are scheduled for a virtual visit with a [facility] provider today. Just as with appointments in the office, your consent must be obtained to participate. Your consent will be active for this visit and any virtual visit you may have with one of our providers in the next 365 days. If you have a MyChart account, a copy of this consent can be sent to you electronically.  As this is a virtual visit, video technology does not allow for your provider to perform a traditional examination. This may limit your provider's ability to fully assess your condition. If your provider identifies any concerns that need to be evaluated in person or the need to arrange testing (such as labs, EKG, etc.), we will make arrangements to do so. Although advances in technology are sophisticated, we cannot ensure that it will always work on either your end or our end. If the connection with a video visit is poor, the visit may have to be switched to a telephone visit. With either a video or telephone visit, we are not always able to ensure that we have a secure connection.  By engaging in this virtual visit, you consent to the provision of healthcare and authorize for your insurance to be billed (if applicable) for the services provided during this visit. Depending on your insurance coverage, you may receive a charge related to this service.  I need to obtain your verbal consent now. Are you willing to proceed with your visit today? [name] has provided verbal consent on 06/03/2022 for a virtual visit (video or telephone). Perlie Mayo, NP  Date: 06/03/2022 11:20 AM  Virtual Visit via Video Note   I, Perlie Mayo, connected with  MAISEE Erickson  ([id number], Feb 07, 1971) on 06/03/22 at 11:15 AM EST by a video-enabled telemedicine application and verified that I am speaking with the correct person using two identifiers.  Location: Patient: Virtual Visit  Location Patient: Home Provider: Virtual Visit Location Provider: Home Office   I discussed the limitations of evaluation and management by telemedicine and the availability of in person appointments. The patient expressed understanding and agreed to proceed.    History of Present Illness: Jennifer Erickson is a 52 y.o. who identifies as a female who was assigned female at birth, and is being seen today for persistent cough that started in mid Feb. She reports overall improvement outside a predominant dry cough. She has sinus congestion- but not a lot and does have allergies and is not currently on anything for them. Denies fevers, chills, chest pain, shortness of breath, or excessive mucus production.   Last office visit 05/27/22- lungs were clear to ausculation and she had just finished prednisone. Advised follow up from that visit if not improved.   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   Alopecia, 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   Elevated blood pressure reading 09/24/2017   Cough 07/03/2017   Left shoulder  pain 12/03/2016   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   Palpitations 07/11/2015   OSA on CPAP 07/10/2015   Large breasts 05/28/2015   Left tennis elbow 05/28/2015   Primary osteoarthritis of both knees 12/08/2014   Vitamin D deficiency 04/19/2014   Bilateral swelling of feet 05/02/2013   Left breast mass 11/22/2012   Plantar fasciitis,  bilateral 11/01/2012   Obesity, Class III, BMI 40-49.9 (morbid obesity) ([place]) 08/19/2011   Uvular swelling 08/19/2011    Allergies:  Allergies  Allergen Reactions   Aspirin Nausea And Vomiting and Other (See Comments)   Augmentin [Amoxicillin-Pot Clavulanate] Diarrhea   Ciprofloxacin Nausea And Vomiting   Hydrocodone Nausea Only    Cough syrup   Ivp Dye [Iodinated Contrast Media]    Sulfa Antibiotics Other (See Comments)    Per pt - unable to take due to lupus    Medications:  Current Outpatient Medications:    benzonatate (TESSALON) 200 MG capsule, Take 1 capsule (200 mg total) by mouth 3 (three) times daily as needed for cough., Disp: 45 capsule, Rfl: 0   Continuous Blood Gluc Sensor (FREESTYLE LIBRE 14 DAY SENSOR) MISC, APPLY TO UPPER DELTOID EVERY 14 DAYS. USE WITH READER TO DETERMINE BLOOD SUGARS, Disp: 2 each, Rfl: 11   etonogestrel (NEXPLANON) 68 MG IMPL implant, 1 each by Subdermal route once., Disp: , Rfl:    hydroxychloroquine (PLAQUENIL) 200 MG tablet, Take 1 tablet by mouth with food or milk twice a day, Disp: 180 tablet, Rfl: 3   hydroxychloroquine (PLAQUENIL) 200 MG tablet, Take 1 tablet (200 mg total) by mouth 2 (two) times daily with food or milk, Disp: 180 tablet, Rfl: 3   Multiple Vitamins-Minerals (ZINC PO), Take by mouth as needed., Disp: , Rfl:    predniSONE (DELTASONE) 50 MG tablet, Take 1 tablet (50 mg total) by mouth daily., Disp: 5 tablet, Rfl: 0   VITAMIN D PO, Take by mouth., Disp: , Rfl:   Observations/Objective: Patient is well-developed, well-nourished in no acute distress.  Resting comfortably  at home.  Head is normocephalic, atraumatic.  No labored breathing.  Speech is clear and coherent with logical content.  Patient is alert and oriented at baseline.  Dry cough present during visit  Assessment and Plan:  1. Subacute cough  - fluticasone (FLONASE) 50 MCG/ACT nasal spray; Place 2 sprays into both nostrils daily.  Dispense: 16 g; Refill: 0 -  cetirizine (ZYRTEC) 10 MG tablet; Take 1 tablet (10 mg total) by mouth daily.  Dispense: 100 tablet; Refill: 0 - brompheniramine-pseudoephedrine-DM 30-2-10 MG/5ML syrup; Take 5 mLs by mouth 4 (four) times daily as needed.  Dispense: 120 mL; Refill: 0 - albuterol (VENTOLIN HFA) 108 (90 Base) MCG/ACT inhaler; Inhale 1-2 puffs into the lungs every 6 (six) hours as needed for wheezing or shortness of breath.  Dispense: 6.7 g; Refill: 0   -Allergy PND vs post viral cough:  - Take meds as prescribed - Rest voice - Use a cool mist humidifier especially during the winter months when heat dries out the air. - Use saline nose sprays frequently to help soothe nasal passages if they are drying out. - Stay hydrated by drinking plenty of fluids - Keep thermostat turn down low to prevent drying out which can cause a dry cough. - For any cough or congestion- robitussin DM or Delsym as needed - For fever or aches or pains- take tylenol or ibuprofen as directed on bottle             *  for fevers greater than 101 orally you may alternate ibuprofen and tylenol every 3 hours.  If you do not improve you will need a follow up visit in person.                Reviewed side effects, risks and benefits of medication.    Patient acknowledged agreement and understanding of the plan.   Past Medical, Surgical, Social History, Allergies, and Medications have been Reviewed.     Follow Up Instructions: I discussed the assessment and treatment plan with the patient. The patient was provided an opportunity to ask questions and all were answered. The patient agreed with the plan and demonstrated an understanding of the instructions.  A copy of instructions were sent to the patient via MyChart unless otherwise noted below.    The patient was advised to call back or seek an in-person evaluation if the symptoms worsen or if the condition fails to improve as anticipated.  Time:  I spent 10 minutes with the patient via  telehealth technology discussing the above problems/concerns.    Perlie Mayo, NP

## 2022-06-03 NOTE — Patient Instructions (Addendum)
Jennifer Erickson, thank you for joining Perlie Mayo, NP for today's virtual visit.  While this provider is not your primary care provider (PCP), if your PCP is located in our provider database this encounter information will be shared with them immediately following your visit.   [facility] account gives you access to today's visit and all your visits, tests, and labs performed at [facility] " click here if you don't have a [facility] account or go to [email or web address]  Consent: (Patient) Jennifer Erickson provided verbal consent for this virtual visit at the beginning of the encounter.  Current Medications:  Current Outpatient Medications:    albuterol (VENTOLIN HFA) 108 (90 Base) MCG/ACT inhaler, Inhale 1-2 puffs into the lungs every 6 (six) hours as needed for wheezing or shortness of breath., Disp: 6.7 g, Rfl: 0   brompheniramine-pseudoephedrine-DM 30-2-10 MG/5ML syrup, Take 5 mLs by mouth 4 (four) times daily as needed., Disp: 120 mL, Rfl: 0   cetirizine (ZYRTEC) 10 MG tablet, Take 1 tablet (10 mg total) by mouth daily., Disp: 100 tablet, Rfl: 0   fluticasone (FLONASE) 50 MCG/ACT nasal spray, Place 2 sprays into both nostrils daily., Disp: 16 g, Rfl: 0   benzonatate (TESSALON) 200 MG capsule, Take 1 capsule (200 mg total) by mouth 3 (three) times daily as needed for cough., Disp: 45 capsule, Rfl: 0   Continuous Blood Gluc Sensor (FREESTYLE LIBRE 14 DAY SENSOR) MISC, APPLY TO UPPER DELTOID EVERY 14 DAYS. USE WITH READER TO DETERMINE BLOOD SUGARS, Disp: 2 each, Rfl: 11   etonogestrel (NEXPLANON) 68 MG IMPL implant, 1 each by Subdermal route once., Disp: , Rfl:    hydroxychloroquine (PLAQUENIL) 200 MG tablet, Take 1 tablet (200 mg total) by mouth 2 (two) times daily with food or milk, Disp: 180 tablet, Rfl: 3   Multiple Vitamins-Minerals (ZINC PO), Take by mouth as needed., Disp: , Rfl:    VITAMIN D PO, Take by mouth., Disp: , Rfl:     Medications ordered in this encounter:  Meds ordered this encounter  Medications   fluticasone (FLONASE) 50 MCG/ACT nasal spray    Sig: Place 2 sprays into both nostrils daily.    Dispense:  16 g    Refill:  0    Order Specific Question:   Supervising Provider    Answer:   Chase Picket [id number]   cetirizine (ZYRTEC) 10 MG tablet    Sig: Take 1 tablet (10 mg total) by mouth daily.    Dispense:  100 tablet    Refill:  0    Order Specific Question:   Supervising Provider    Answer:   Chase Picket [id number]   brompheniramine-pseudoephedrine-DM 30-2-10 MG/5ML syrup    Sig: Take 5 mLs by mouth 4 (four) times daily as needed.    Dispense:  120 mL    Refill:  0    Order Specific Question:   Supervising Provider    Answer:   Chase Picket [id number]   albuterol (VENTOLIN HFA) 108 (90 Base) MCG/ACT inhaler    Sig: Inhale 1-2 puffs into the lungs every 6 (six) hours as needed for wheezing or shortness of breath.    Dispense:  6.7 g    Refill:  0    Order Specific Question:   Supervising Provider    Answer:   Chase Picket [id number]     *If you need refills on other medications prior to your next appointment, please contact your  pharmacy*  Follow-Up: Call back or seek an in-person evaluation if the symptoms worsen or if the condition fails to improve as anticipated.  [place] [phone]  Other Instructions  - Take meds as prescribed   - Rest voice - Use a cool mist humidifier especially during the winter months when heat dries out the air. - Use saline nose sprays frequently to help soothe nasal passages if they are drying out. - Stay hydrated by drinking plenty of fluids - Keep thermostat turn down low to prevent drying out which can cause a dry cough.  - For fever or aches or pains- take tylenol or ibuprofen as directed on bottle             * for fevers greater than 101 orally you may alternate ibuprofen and tylenol every 3 hours.  If  you do not improve you will need a follow up visit in person.    If you have been instructed to have an in-person evaluation today at a local Urgent Care facility, please use the link below. It will take you to a list of all of our available [facility], including address, phone number and hours of operation. Please do not delay care.  [facility]  If you or a family member do not have a primary care provider, use the link below to schedule a visit and establish care. When you choose a [facility] primary care physician or advanced practice provider, you gain a long-term partner in health. Find a Primary Care Provider  Learn more about [facility]'s in-office and virtual care options: [facility]

## 2022-06-05 ENCOUNTER — Encounter: Payer: Self-pay | Admitting: Medical-Surgical

## 2022-06-06 ENCOUNTER — Encounter: Payer: Self-pay | Admitting: Physician Assistant

## 2022-06-27 ENCOUNTER — Ambulatory Visit
Admission: RE | Admit: 2022-06-27 | Discharge: 2022-06-27 | Disposition: A | Discharge status: Home / Self Care | Payer: Federal, State, Local not specified - PPO | Source: Ambulatory Visit | Attending: Family Medicine | Admitting: Family Medicine

## 2022-06-27 ENCOUNTER — Ambulatory Visit (INDEPENDENT_AMBULATORY_CARE_PROVIDER_SITE_OTHER): Payer: Federal, State, Local not specified - PPO

## 2022-06-27 VITALS — BP 136/88 | HR 107 | Temp 98.1°F | Resp 18 | Ht 66.0 in | Wt 285.0 lb

## 2022-06-27 DIAGNOSIS — M79631 Pain in right forearm: Secondary | ICD-10-CM

## 2022-06-27 MED ORDER — METHOCARBAMOL 750 MG PO TABS: ORAL_TABLET | ORAL | 1 refills | Status: DC

## 2022-06-27 NOTE — Discharge Instructions (Signed)
Apply heating pad 2 to 3 times daily.  May take Ibuprofen 200mg , 3 or 4 tabs every 8 hours with food.  Begin gentle range of motion and stretching exercises as tolerated.  Avoid wearing elbow brace for now.

## 2022-06-27 NOTE — ED Provider Notes (Signed)
Jenn[facility] Erickson [facility]    CSN: [id number] Arrival date & time: 06/27/22  1721      History   Chief Complaint Chief Complaint  Patient presents with   Arm Pain    right    HPI Jennifer Erickson is a 52 y.o. female.   Patient complains of pain in her right forearm for about six days, now with intermittent radiation of pain to her inferior biceps muscle.  She denies injury or changes in activities but notes that she works for the [employer] where she performs repetitive arm movement daily.  She has a past history of right lateral epicondylitis and has been wearing a tennis elbow brace to no avail.  She reports weaker right grip because of her forearm pain.  She has not tried any type of pain medication.  The history is provided by the patient.  Arm Pain This is a new problem. Episode onset: 6 days ago. The problem occurs constantly. The problem has been gradually worsening. Exacerbated by: Right forearm movement. Nothing relieves the symptoms. Treatments tried: elbow brace. The treatment provided no relief.    Past Medical History:  Diagnosis Date   Lupus ([place])    Personal history of amenorrhea    Sinusitis     Patient Active Problem List   Diagnosis Date Noted   Viral infection 11/01/2021   Radiculitis of right cervical region 10/11/2021   Laceration of scalp 04/23/2021   Pre-diabetes 09/17/2020   PND (post-nasal drip) 01/24/2020   Trochanteric bursitis, right hip 01/04/2020   Numbness and tingling in both hands 08/18/2019   Lumbar degenerative disc disease 06/06/2019   Asteatosis 03/23/2019   Effect of high altitude on sinuses 123XX123   Metabolic syndrome with hyperglycemia 07/23/2018   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   Alopecia, 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   Elevated blood pressure reading 09/24/2017   Cough 07/03/2017   Left shoulder pain 12/03/2016   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   Palpitations 07/11/2015    OSA on CPAP 07/10/2015   Large breasts 05/28/2015   Left tennis elbow 05/28/2015   Primary osteoarthritis of both knees 12/08/2014   Vitamin D deficiency 04/19/2014   Bilateral swelling of feet 05/02/2013   Left breast mass 11/22/2012   Plantar fasciitis, bilateral 11/01/2012   Obesity, Class III, BMI 40-49.9 (morbid obesity) ([place]) 08/19/2011   Uvular swelling 08/19/2011    Past Surgical History:  Procedure Laterality Date   DILATION AND CURETTAGE OF UTERUS     nexplanon insertion     TONSILLECTOMY      OB History     Gravida  0   Para  0   Term  0   Preterm  0   AB  0   Living  0      SAB  0   IAB  0   Ectopic  0   Multiple  0   Live Births  0            Home Medications    Prior to Admission medications   Medication Sig Start Date End Date Taking? Authorizing Provider  fluticasone (FLONASE) 50 MCG/ACT nasal spray Place 2 sprays into both nostrils daily. 06/03/22  Yes Perlie Mayo, NP  methocarbamol (ROBAXIN) 750 MG tablet Take one tab PO BID to TID for muscle pain 06/27/22  Yes Kandra Nicolas, MD  albuterol (VENTOLIN HFA) 108 (90 Base) MCG/ACT inhaler Inhale 1-2 puffs into the lungs every 6 (  six) hours as needed for wheezing or shortness of breath. 06/03/22   Perlie Mayo, NP  benzonatate (TESSALON) 200 MG capsule Take 1 capsule (200 mg total) by mouth 3 (three) times daily as needed for cough. 05/21/22   Samuel Bouche, NP  brompheniramine-pseudoephedrine-DM 30-2-10 MG/5ML syrup Take 5 mLs by mouth 4 (four) times daily as needed. Patient not taking: Reported on 06/27/2022 06/03/22   Perlie Mayo, NP  cetirizine (ZYRTEC) 10 MG tablet Take 1 tablet (10 mg total) by mouth daily. 06/03/22   Perlie Mayo, NP  Continuous Blood Gluc Sensor (FREESTYLE LIBRE 14 DAY SENSOR) MISC APPLY TO UPPER DELTOID EVERY 14 DAYS. USE WITH READER TO DETERMINE BLOOD SUGARS 10/08/21 10/08/22  Breeback, Royetta Car, PA-C  etonogestrel (NEXPLANON) 68 MG IMPL implant 1 each by  Subdermal route once. 12/25/21   [provider]  hydroxychloroquine (PLAQUENIL) 200 MG tablet Take 1 tablet (200 mg total) by mouth 2 (two) times daily with food or milk 01/30/22     Multiple Vitamins-Minerals (ZINC PO) Take by mouth as needed.    [provider]  VITAMIN D PO Take by mouth.    [provider]    Family History Family History  Problem Relation Age of Onset   Cancer Mother        brain   Hypertension Father    Diabetes Brother     Social History Social History   Tobacco Use   Smoking status: Never   Smokeless tobacco: Never  Vaping Use   Vaping Use: Never used  Substance Use Topics   Alcohol use: No   Drug use: No     Allergies   Aspirin, Augmentin [amoxicillin-pot clavulanate], Ciprofloxacin, Hydrocodone, Ivp dye [iodinated contrast media], and Sulfa antibiotics   Review of Systems Review of Systems  Constitutional: Negative.   Musculoskeletal:  Positive for myalgias. Negative for joint swelling.  Skin:  Negative for color change and rash.  Neurological:  Negative for numbness.  All other systems reviewed and are negative.    Physical Exam Triage Vital Signs ED Triage Vitals  Enc Vitals Group     BP 06/27/22 1739 136/88     Pulse Rate 06/27/22 1739 (!) 107     Resp 06/27/22 1739 18     Temp 06/27/22 1739 98.1 F (36.7 C)     Temp Source 06/27/22 1739 Oral     SpO2 06/27/22 1739 97 %     Weight 06/27/22 1736 285 lb (129.3 kg)     Height 06/27/22 1736 5\' 6"  (1.676 m)     Head Circumference --      Peak Flow --      Pain Score 06/27/22 1735 7     Pain Loc --      Pain Edu? --      Excl. in [place]? --    No data found.  Updated Vital Signs BP 136/88 (BP Location: Right Arm)   Pulse (!) 107   Temp 98.1 F (36.7 C) (Oral)   Resp 18   Ht 5\' 6"  (1.676 m)   Wt 129.3 kg   SpO2 97%   BMI 46.00 kg/m   Visual Acuity Right Eye Distance:   Left Eye Distance:   Bilateral Distance:    Right Eye Near:   Left Eye  Near:    Bilateral Near:     Physical Exam Vitals and nursing note reviewed.  Constitutional:      General: She is not in acute  distress. HENT:     Head: Normocephalic.  Eyes:     Pupils: Pupils are equal, round, and reactive to light.  Cardiovascular:     Rate and Rhythm: Tachycardia present.  Pulmonary:     Effort: Pulmonary effort is normal.  Musculoskeletal:       Arms:     Comments: Right forearm has several distinct trigger points of tenderness over the mid-body of the right brachioradialis muscle.  There is mild tenderness to palpation over the lower right biceps muscle.  There is no tenderness to palpation over the right lateral epicondyle.  Distal neurovascular function is intact.   Skin:    General: Skin is warm and dry.     Findings: No erythema or rash.  Neurological:     General: No focal deficit present.     Mental Status: She is alert.      UC Treatments / Results  Labs (all labs ordered are listed, but only abnormal results are displayed) Labs Reviewed - No data to display  EKG   Radiology DG Forearm Right  Result Date: 06/27/2022 CLINICAL DATA:  Pain EXAM: RIGHT FOREARM - 2 VIEW COMPARISON:  None Available. FINDINGS: No recent fracture or dislocation is seen. There is 5 mm smoothly marginated calcification at the attachment of triceps tendon to the olecranon process. IMPRESSION: No recent fracture or dislocation is seen. There is 5 mm smooth marginated calcification adjacent to the olecranon process at the attachment of triceps tendon suggesting calcific bursitis or calcific tendinosis. Electronically Signed   By: Elmer Picker M.D.   On: 06/27/2022 18:24    Procedures Procedures (including critical care time)  Medications Ordered in UC Medications - No data to display  Initial Impression / Assessment and Plan / UC Course  I have reviewed the triage vital signs and the nursing notes.  Pertinent labs & imaging results that were available during  my care of the patient were reviewed by me and considered in my medical decision making (see chart for details).    Suspect repetitive microtrauma to patient's right brachioradialis muscle from repetitive arm activities in her job at post [employer], resulting in a myofascial pain syndrome.  Begin Robaxin and ibuprofen.  Rest arm as much as possible. Given treatment instructions with range of motion and stretching exercises.  Followup with Dr. Aundria Mems ([facility]) if not improving about two weeks.   Final Clinical Impressions(s) / UC Diagnoses   Final diagnoses:  Right forearm pain     Discharge Instructions      Apply heating pad 2 to 3 times daily.  May take Ibuprofen 200mg , 3 or 4 tabs every 8 hours with food.  Begin gentle range of motion and stretching exercises as tolerated.  Avoid wearing elbow brace for now.     ED Prescriptions     Medication Sig Dispense Auth. Provider   methocarbamol (ROBAXIN) 750 MG tablet Take one tab PO BID to TID for muscle pain 15 tablet Kandra Nicolas, MD         Kandra Nicolas, MD 06/29/22 [phone]

## 2022-06-27 NOTE — ED Triage Notes (Signed)
Per pt" right arm has been hurting for six days" Pt has pain in Right  FA  & in bicep area Denies  injury  Has used a forearm brace &  a brace  for  tennis elbow - no relief No pain meds OTC  Weaker grip when she is picking things up due to pain

## 2022-07-08 ENCOUNTER — Other Ambulatory Visit (HOSPITAL_BASED_OUTPATIENT_CLINIC_OR_DEPARTMENT_OTHER): Payer: Self-pay

## 2022-07-18 ENCOUNTER — Ambulatory Visit: Payer: Federal, State, Local not specified - PPO | Admitting: Sports Medicine

## 2022-07-18 ENCOUNTER — Other Ambulatory Visit (HOSPITAL_BASED_OUTPATIENT_CLINIC_OR_DEPARTMENT_OTHER): Payer: Self-pay

## 2022-07-18 DIAGNOSIS — S76309A Unspecified injury of muscle, fascia and tendon of the posterior muscle group at thigh level, unspecified thigh, initial encounter: Secondary | ICD-10-CM | POA: Insufficient documentation

## 2022-07-18 DIAGNOSIS — M7521 Bicipital tendinitis, right shoulder: Secondary | ICD-10-CM | POA: Diagnosis not present

## 2022-07-18 MED ORDER — IBUPROFEN 200 MG PO TABS: 800.0000 mg | ORAL_TABLET | Freq: Four times a day (QID) | ORAL | Status: DC | PRN

## 2022-07-18 MED ORDER — PREDNISONE 50 MG PO TABS
50.0000 mg | ORAL_TABLET | Freq: Every day | ORAL | 0 refills | Status: DC
  Filled 2022-07-18: qty 5, 5d supply, fill #0

## 2022-07-18 NOTE — Progress Notes (Signed)
    Procedures performed today:    None.  Independent interpretation of notes and tests performed by another provider:   None.  Brief History, Exam, Impression, and Recommendations:    Biceps tendinitis, right, distal Pain right anterior elbow worse with a positive speeds test and resisted supination. Tender distal biceps insertion. Adding biceps conditioning, steroids and ibuprofen, return in 6 weeks. MRI versus distal bicep sheath injection if not better.  Bilateral hamstring strain Was walking the dogs, they bolted, she was pulled forward, she then had severe pain buttock bilaterally, on exam tenderness is at the proximal hamstring origin at the ischial tuberosity, worse with resisted flexion of the knees. Steroids and ibuprofen as above, will do some hamstring conditioning, return to see me in about 6 weeks.    ____________________________________________ Ihor Austin. Benjamin Stain, M.D., ABFM., CAQSM., AME. Primary Care and Sports Medicine [facility]  Adjunct Professor of Family Medicine  [place] [employer] [employer]  Restaurant manager, fast food

## 2022-07-18 NOTE — Assessment & Plan Note (Signed)
Pain right anterior elbow worse with a positive speeds test and resisted supination. Tender distal biceps insertion. Adding biceps conditioning, steroids and ibuprofen, return in 6 weeks. MRI versus distal bicep sheath injection if not better.

## 2022-07-18 NOTE — Assessment & Plan Note (Signed)
Was walking the dogs, they bolted, she was pulled forward, she then had severe pain buttock bilaterally, on exam tenderness is at the proximal hamstring origin at the ischial tuberosity, worse with resisted flexion of the knees. Steroids and ibuprofen as above, will do some hamstring conditioning, return to see me in about 6 weeks.

## 2022-07-20 ENCOUNTER — Encounter: Payer: Self-pay | Admitting: Sports Medicine

## 2022-08-13 ENCOUNTER — Other Ambulatory Visit (HOSPITAL_BASED_OUTPATIENT_CLINIC_OR_DEPARTMENT_OTHER): Payer: Self-pay

## 2022-08-20 ENCOUNTER — Ambulatory Visit (INDEPENDENT_AMBULATORY_CARE_PROVIDER_SITE_OTHER): Payer: Federal, State, Local not specified - PPO

## 2022-08-20 ENCOUNTER — Ambulatory Visit
Admission: RE | Admit: 2022-08-20 | Discharge: 2022-08-20 | Disposition: A | Discharge status: Home / Self Care | Payer: Federal, State, Local not specified - PPO | Source: Ambulatory Visit | Attending: Family Medicine | Admitting: Family Medicine

## 2022-08-20 ENCOUNTER — Other Ambulatory Visit: Payer: Self-pay

## 2022-08-20 VITALS — BP 118/84 | HR 90 | Temp 98.2°F | Resp 18

## 2022-08-20 DIAGNOSIS — R059 Cough, unspecified: Secondary | ICD-10-CM

## 2022-08-20 DIAGNOSIS — R6889 Other general symptoms and signs: Secondary | ICD-10-CM

## 2022-08-20 LAB — POCT INFLUENZA A/B
Influenza A, POC: NEGATIVE
Influenza B, POC: NEGATIVE

## 2022-08-20 MED ORDER — BENZONATATE 200 MG PO CAPS: 200.0000 mg | ORAL_CAPSULE | Freq: Three times a day (TID) | ORAL | 0 refills | Status: AC | PRN

## 2022-08-20 NOTE — ED Triage Notes (Signed)
Pt states she has had body aches and diarrhea since Friday. States she was febrile over the weekend but afebrile since Sunday. She has taken OTC meds for aches.

## 2022-08-20 NOTE — ED Provider Notes (Signed)
[name]    CSN: [id number] Arrival date & time: 08/20/22  1820      History   Chief Complaint Chief Complaint  Patient presents with   Generalized Body Aches   Diarrhea    HPI Jennifer Erickson is a 52 y.o. female.   HPI 52 year old female presents with generalized body aches, diarrhea, and cough x 5 days    PMH significant for lupus, metabolic syndrome with hyperglycemia and morbid obesity.  Past Medical History:  Diagnosis Date   Lupus (HCC)    Personal history of amenorrhea    Sinusitis     Patient Active Problem List   Diagnosis Date Noted   Biceps tendinitis, right, distal 07/18/2022   Bilateral hamstring strain 07/18/2022   Viral infection 11/01/2021   Radiculitis of right cervical region 10/11/2021   Laceration of scalp 04/23/2021   Pre-diabetes 09/17/2020   PND (post-nasal drip) 01/24/2020   Trochanteric bursitis, right hip 01/04/2020   Numbness and tingling in both hands 08/18/2019   Lumbar degenerative disc disease 06/06/2019   Asteatosis 03/23/2019   Effect of high altitude on sinuses 07/23/2018   Metabolic syndrome with hyperglycemia 07/23/2018   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   Alopecia, 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   Elevated blood pressure reading 09/24/2017   Cough 07/03/2017   Left shoulder pain 12/03/2016   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   Palpitations 07/11/2015   OSA on CPAP 07/10/2015   Large breasts 05/28/2015   Left tennis elbow 05/28/2015   Primary osteoarthritis of both knees 12/08/2014   Vitamin D deficiency 04/19/2014   Bilateral swelling of feet 05/02/2013   Left breast mass 11/22/2012   Plantar fasciitis, bilateral 11/01/2012   Obesity, Class III, BMI 40-49.9 (morbid obesity) (HCC) 08/19/2011   Uvular swelling 08/19/2011    Past Surgical History:  Procedure Laterality Date   DILATION AND CURETTAGE OF UTERUS     nexplanon insertion     TONSILLECTOMY       OB History     Gravida  0   Para  0   Term  0   Preterm  0   AB  0   Living  0      SAB  0   IAB  0   Ectopic  0   Multiple  0   Live Births  0            Home Medications    Prior to Admission medications   Medication Sig Start Date End Date Taking? Authorizing Provider  benzonatate (TESSALON) 200 MG capsule Take 1 capsule (200 mg total) by mouth 3 (three) times daily as needed for up to 7 days. 08/20/22 08/27/22 Yes Trevor Iha, FNP  albuterol (VENTOLIN HFA) 108 (90 Base) MCG/ACT inhaler Inhale 1-2 puffs into the lungs every 6 (six) hours as needed for wheezing or shortness of breath. 06/03/22   Freddy Finner, NP  cetirizine (ZYRTEC) 10 MG tablet Take 1 tablet (10 mg total) by mouth daily. 06/03/22   Freddy Finner, NP  Continuous Glucose Sensor (FREESTYLE LIBRE 14 DAY SENSOR) MISC APPLY TO UPPER DELTOID EVERY 14 DAYS. USE WITH READER TO DETERMINE BLOOD SUGARS 10/08/21 10/08/22  Breeback, Lonna Cobb, PA-C  etonogestrel (NEXPLANON) 68 MG IMPL implant 1 each by Subdermal route once. 12/25/21   [provider]  fluticasone (FLONASE) 50 MCG/ACT nasal spray Place 2 sprays into both nostrils daily. 06/03/22   Freddy Finner, NP  hydroxychloroquine (  PLAQUENIL) 200 MG tablet Take 1 tablet (200 mg total) by mouth 2 (two) times daily with food or milk 01/30/22     ibuprofen (ADVIL) 200 MG tablet Take 4 tablets (800 mg total) by mouth every 6 (six) hours as needed. 07/18/22   Monica Becton, MD  Multiple Vitamins-Minerals (ZINC PO) Take by mouth as needed.    [provider]  VITAMIN D PO Take by mouth.    [provider]    Family History Family History  Problem Relation Age of Onset   Cancer Mother        brain   Hypertension Father    Diabetes Brother     Social History Social History   Tobacco Use   Smoking status: Never   Smokeless tobacco: Never  Vaping Use   Vaping Use: Never used  Substance Use Topics   Alcohol use: No    Drug use: No     Allergies   Aspirin, Augmentin [amoxicillin-pot clavulanate], Ciprofloxacin, Hydrocodone, Ivp dye [iodinated contrast media], and Sulfa antibiotics   Review of Systems Review of Systems   Physical Exam Triage Vital Signs ED Triage Vitals  Enc Vitals Group     BP      Pulse      Resp      Temp      Temp src      SpO2      Weight      Height      Head Circumference      Peak Flow      Pain Score      Pain Loc      Pain Edu?      Excl. in GC?    No data found.  Updated Vital Signs BP 118/84 (BP Location: Right Arm)   Pulse 90   Temp 98.2 F (36.8 C) (Oral)   Resp 18   SpO2 98%      Physical Exam Vitals and nursing note reviewed.  Constitutional:      Appearance: Normal appearance. She is obese.  HENT:     Head: Normocephalic and atraumatic.     Mouth/Throat:     Mouth: Mucous membranes are moist.     Pharynx: Oropharynx is clear.  Eyes:     Extraocular Movements: Extraocular movements intact.     Conjunctiva/sclera: Conjunctivae normal.     Pupils: Pupils are equal, round, and reactive to light.  Cardiovascular:     Rate and Rhythm: Normal rate and regular rhythm.     Pulses: Normal pulses.     Heart sounds: Normal heart sounds.  Pulmonary:     Effort: Pulmonary effort is normal.     Breath sounds: Normal breath sounds. No wheezing, rhonchi or rales.     Comments: Diminished breath sounds noted throughout, infrequent nonproductive cough on exam Musculoskeletal:        General: Normal range of motion.     Cervical back: Normal range of motion and neck supple.  Skin:    General: Skin is warm.  Neurological:     General: No focal deficit present.     Mental Status: She is alert and oriented to person, place, and time. Mental status is at baseline.  Psychiatric:        Mood and Affect: Mood normal.        Behavior: Behavior normal.      UC Treatments / Results  Labs (all labs ordered are listed, but only abnormal results are  displayed) Labs Reviewed  POCT INFLUENZA A/B    EKG   Radiology DG Chest 2 View  Result Date: 08/20/2022 CLINICAL DATA:  Cough. EXAM: CHEST - 2 VIEW COMPARISON:  11/18/2021 FINDINGS: The heart size and mediastinal contours are within normal limits. There is no evidence of pulmonary edema, consolidation, pneumothorax, nodule or pleural fluid. The visualized skeletal structures are unremarkable. IMPRESSION: No active cardiopulmonary disease. Electronically Signed   By: Irish Lack M.D.   On: 08/20/2022 19:02    Procedures Procedures (including critical care time)  Medications Ordered in UC Medications - No data to display  Initial Impression / Assessment and Plan / UC Course  I have reviewed the triage vital signs and the nursing notes.  Pertinent labs & imaging results that were available during my care of the patient were reviewed by me and considered in my medical decision making (see chart for details).     MDM: 1.  Flulike symptoms-influenza A and B were negative; 2. Cough, unspecified type-CXR was negative, Rx'd Tessalon Perles 200 mg 3 times daily, as needed. Advised of chest x-ray results with hardcopy provided.  Advised patient that POCT influenza was negative this evening advised patient may take Tessalon Perles daily or as needed for cough.  Encouraged increase daily water intake to 64 ounces per day while taking this medication.  Advised if symptoms worsen and/or unresolved please follow-up with PCP for further evaluation. Final Clinical Impressions(s) / UC Diagnoses   Final diagnoses:  Flu-like symptoms  Cough, unspecified type     Discharge Instructions      Advised of chest x-ray results with hardcopy provided.  Advised patient that POCT influenza was negative this evening advised patient may take Tessalon Perles daily or as needed for cough.  Encouraged increase daily water intake to 64 ounces per day while taking this medication.  Advised if symptoms worsen  and/or unresolved please follow-up with PCP for further evaluation     ED Prescriptions     Medication Sig Dispense Auth. Provider   benzonatate (TESSALON) 200 MG capsule Take 1 capsule (200 mg total) by mouth 3 (three) times daily as needed for up to 7 days. 40 capsule Trevor Iha, FNP      PDMP not reviewed this encounter.   Trevor Iha, FNP 08/20/22 [phone]

## 2022-08-20 NOTE — Discharge Instructions (Addendum)
Advised of chest x-ray results with hardcopy provided.  Advised patient that POCT influenza was negative this evening advised patient may take Tessalon Perles daily or as needed for cough.  Encouraged increase daily water intake to 64 ounces per day while taking this medication.  Advised if symptoms worsen and/or unresolved please follow-up with PCP for further evaluation

## 2022-08-21 ENCOUNTER — Ambulatory Visit: Payer: Federal, State, Local not specified - PPO | Admitting: Obstetrics & Gynecology

## 2022-08-22 ENCOUNTER — Other Ambulatory Visit (HOSPITAL_BASED_OUTPATIENT_CLINIC_OR_DEPARTMENT_OTHER): Payer: Self-pay

## 2022-08-22 DIAGNOSIS — M170 Bilateral primary osteoarthritis of knee: Secondary | ICD-10-CM | POA: Diagnosis not present

## 2022-08-22 DIAGNOSIS — G4733 Obstructive sleep apnea (adult) (pediatric): Secondary | ICD-10-CM | POA: Diagnosis not present

## 2022-08-22 DIAGNOSIS — L930 Discoid lupus erythematosus: Secondary | ICD-10-CM | POA: Diagnosis not present

## 2022-08-22 MED ORDER — HYDROXYCHLOROQUINE SULFATE 200 MG PO TABS
200.0000 mg | ORAL_TABLET | Freq: Two times a day (BID) | ORAL | 3 refills | Status: DC
  Filled 2022-12-19 – 2023-03-04 (×2): qty 180, 90d supply, fill #0
  Filled 2023-06-03 – 2023-06-18 (×2): qty 180, 90d supply, fill #1

## 2022-08-29 ENCOUNTER — Other Ambulatory Visit (HOSPITAL_BASED_OUTPATIENT_CLINIC_OR_DEPARTMENT_OTHER): Payer: Self-pay

## 2022-08-29 ENCOUNTER — Ambulatory Visit (INDEPENDENT_AMBULATORY_CARE_PROVIDER_SITE_OTHER): Payer: Federal, State, Local not specified - PPO

## 2022-08-29 ENCOUNTER — Ambulatory Visit: Payer: Federal, State, Local not specified - PPO | Admitting: Sports Medicine

## 2022-08-29 DIAGNOSIS — R052 Subacute cough: Secondary | ICD-10-CM

## 2022-08-29 DIAGNOSIS — M7521 Bicipital tendinitis, right shoulder: Secondary | ICD-10-CM

## 2022-08-29 DIAGNOSIS — S76309A Unspecified injury of muscle, fascia and tendon of the posterior muscle group at thigh level, unspecified thigh, initial encounter: Secondary | ICD-10-CM | POA: Diagnosis not present

## 2022-08-29 DIAGNOSIS — R0989 Other specified symptoms and signs involving the circulatory and respiratory systems: Secondary | ICD-10-CM | POA: Diagnosis not present

## 2022-08-29 DIAGNOSIS — R059 Cough, unspecified: Secondary | ICD-10-CM | POA: Diagnosis not present

## 2022-08-29 MED ORDER — AZITHROMYCIN 250 MG PO TABS
ORAL_TABLET | ORAL | 0 refills | Status: DC
  Filled 2022-08-29: qty 6, 5d supply, fill #0

## 2022-08-29 NOTE — Progress Notes (Signed)
    Procedures performed today:    None.  Independent interpretation of notes and tests performed by another provider:   None.  Brief History, Exam, Impression, and Recommendations:    Cough Increasing cough for about 2 weeks, was seen in urgent care and discharge, on exam today she does have right upper lobe crackles, I would like to add a chest x-ray, azithromycin. Return to see PCP in 1 to 2 weeks as needed.  Biceps tendinitis, right, distal Jennifer Erickson returns, she continues to have severe right anterior elbow pain, she has had this for over 6 weeks now, pain is worse with supination and she has a positive speeds test. X-rays unrevealing, proceeding with MRI for distal bicep sheath injection planning  Bilateral hamstring strain Please see prior notes, resolved with conservative treatment.    ____________________________________________ Ihor Austin. Benjamin Stain, M.D., ABFM., CAQSM., AME. Primary Care and Sports Medicine [facility]  Adjunct Professor of Family Medicine  [employer] [facility] [employer]  Restaurant manager, fast food

## 2022-08-29 NOTE — Assessment & Plan Note (Signed)
Jennifer Erickson returns, she continues to have severe right anterior elbow pain, she has had this for over 6 weeks now, pain is worse with supination and she has a positive speeds test. X-rays unrevealing, proceeding with MRI for distal bicep sheath injection planning

## 2022-08-29 NOTE — Assessment & Plan Note (Signed)
Please see prior notes, resolved with conservative treatment.

## 2022-08-29 NOTE — Assessment & Plan Note (Signed)
Increasing cough for about 2 weeks, was seen in urgent care and discharge, on exam today she does have right upper lobe crackles, I would like to add a chest x-ray, azithromycin. Return to see PCP in 1 to 2 weeks as needed.

## 2022-08-31 ENCOUNTER — Encounter: Payer: Self-pay | Admitting: Sports Medicine

## 2022-09-02 DIAGNOSIS — Z1231 Encounter for screening mammogram for malignant neoplasm of breast: Secondary | ICD-10-CM | POA: Diagnosis not present

## 2022-09-02 LAB — HM MAMMOGRAPHY

## 2022-09-05 ENCOUNTER — Ambulatory Visit (INDEPENDENT_AMBULATORY_CARE_PROVIDER_SITE_OTHER): Payer: Federal, State, Local not specified - PPO | Admitting: Physician Assistant

## 2022-09-05 ENCOUNTER — Encounter: Payer: Self-pay | Admitting: Physician Assistant

## 2022-09-05 VITALS — BP 124/81 | HR 95 | Ht 65.0 in | Wt 288.0 lb

## 2022-09-05 DIAGNOSIS — I878 Other specified disorders of veins: Secondary | ICD-10-CM

## 2022-09-05 DIAGNOSIS — Z0000 Encounter for general adult medical examination without abnormal findings: Secondary | ICD-10-CM | POA: Diagnosis not present

## 2022-09-05 DIAGNOSIS — Z6841 Body Mass Index (BMI) 40.0 and over, adult: Secondary | ICD-10-CM

## 2022-09-05 DIAGNOSIS — G4733 Obstructive sleep apnea (adult) (pediatric): Secondary | ICD-10-CM

## 2022-09-05 DIAGNOSIS — R7309 Other abnormal glucose: Secondary | ICD-10-CM | POA: Diagnosis not present

## 2022-09-05 MED ORDER — AMBULATORY NON FORMULARY MEDICATION: 0 refills | Status: DC

## 2022-09-05 NOTE — Patient Instructions (Signed)

## 2022-09-05 NOTE — Progress Notes (Unsigned)
Complete physical exam  Patient: Jennifer Erickson   DOB: 1970/11/10   52 y.o. Female  MRN: [id number]  Subjective:    No chief complaint on file.   Jennifer Erickson is a 52 y.o. female who presents today for a complete physical exam. She reports consuming a {diet types:17450} diet. {types:19826} She generally feels well. She reports sleeping poorly. She does not have additional problems to discuss today.   Not using machine   Most recent fall risk assessment:    05/21/2022   11:19 AM  Fall Risk   Falls in the past year? 0  Number falls in past yr: 0  Injury with Fall? 0  Risk for fall due to : No Fall Risks  Follow up Falls evaluation completed     Most recent depression screenings:    01/15/2022    2:16 PM 08/14/2021    9:25 AM  PHQ 2/9 Scores  PHQ - 2 Score 0 0    Vision:Within last year and Dental: No current dental problems and Receives regular dental care  Patient Active Problem List   Diagnosis Date Noted   Biceps tendinitis, right, distal 07/18/2022   Bilateral hamstring strain 07/18/2022   Viral infection 11/01/2021   Radiculitis of right cervical region 10/11/2021   Laceration of scalp 04/23/2021   Pre-diabetes 09/17/2020   PND (post-nasal drip) 01/24/2020   Trochanteric bursitis, right hip 01/04/2020   Numbness and tingling in both hands 08/18/2019   Lumbar degenerative disc disease 06/06/2019   Asteatosis 03/23/2019   Effect of high altitude on sinuses 07/23/2018   Metabolic syndrome with hyperglycemia 07/23/2018   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   Alopecia, 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   Elevated blood pressure reading 09/24/2017   Cough 07/03/2017   Left shoulder pain 12/03/2016   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   Palpitations 07/11/2015   OSA on CPAP 07/10/2015   Large breasts 05/28/2015   Left tennis elbow 05/28/2015   Primary osteoarthritis of both knees 12/08/2014   Vitamin D  deficiency 04/19/2014   Bilateral swelling of feet 05/02/2013   Left breast mass 11/22/2012   Plantar fasciitis, bilateral 11/01/2012   Obesity, Class III, BMI 40-49.9 (morbid obesity) (HCC) 08/19/2011   Uvular swelling 08/19/2011   Past Medical History:  Diagnosis Date   Lupus (HCC)    Personal history of amenorrhea    Sinusitis    Family History  Problem Relation Age of Onset   Cancer Mother        brain   Hypertension Father    Diabetes Brother    Allergies  Allergen Reactions   Aspirin Nausea And Vomiting and Other (See Comments)   Augmentin [Amoxicillin-Pot Clavulanate] Diarrhea   Ciprofloxacin Nausea And Vomiting   Hydrocodone Nausea Only    Cough syrup   Ivp Dye [Iodinated Contrast Media]    Sulfa Antibiotics Other (See Comments)    Per pt - unable to take due to lupus       Patient Care Team: Nolene Ebbs as PCP - General (Family Medicine)   Outpatient Medications Prior to Visit  Medication Sig   albuterol (VENTOLIN HFA) 108 (90 Base) MCG/ACT inhaler Inhale 1-2 puffs into the lungs every 6 (six) hours as needed for wheezing or shortness of breath.   azithromycin (ZITHROMAX Z-PAK) 250 MG tablet Take 2 tablets (500 mg) on  Day 1,  followed by 1 tablet (250 mg) once daily on Days 2 through 5.  cetirizine (ZYRTEC) 10 MG tablet Take 1 tablet (10 mg total) by mouth daily.   Continuous Glucose Sensor (FREESTYLE LIBRE 14 DAY SENSOR) MISC APPLY TO UPPER DELTOID EVERY 14 DAYS. USE WITH READER TO DETERMINE BLOOD SUGARS   etonogestrel (NEXPLANON) 68 MG IMPL implant 1 each by Subdermal route once.   fluticasone (FLONASE) 50 MCG/ACT nasal spray Place 2 sprays into both nostrils daily.   hydroxychloroquine (PLAQUENIL) 200 MG tablet Take 1 tablet (200 mg total) by mouth 2 (two) times daily with food or milk   hydroxychloroquine (PLAQUENIL) 200 MG tablet Take 1 tablet (200 mg total) by mouth 2 (two) times daily. With food or milk   ibuprofen (ADVIL) 200 MG tablet Take  4 tablets (800 mg total) by mouth every 6 (six) hours as needed.   Multiple Vitamins-Minerals (ZINC PO) Take by mouth as needed.   VITAMIN D PO Take by mouth.   No facility-administered medications prior to visit.    ROS        Objective:     There were no vitals taken for this visit. BP Readings from Last 3 Encounters:  09/05/22 124/81  08/20/22 118/84  06/27/22 136/88   Wt Readings from Last 3 Encounters:  09/05/22 288 lb (130.6 kg)  06/27/22 285 lb (129.3 kg)  05/27/22 291 lb (132 kg)      Physical Exam   No results found for any visits on 09/05/22. {Show previous labs (optional):23779}    Assessment & Plan:    Routine Health Maintenance and Physical Exam  Immunization History  Administered Date(s) Administered   COVID-19, mRNA, vaccine(Comirnaty)12 years and older 05/15/2022   Influenza,inj,Quad PF,6+ Mos 01/12/2018, 01/05/2019, 06/05/2020, 01/01/2021, 01/08/2022   PFIZER Comirnaty(Gray Top)Covid-19 Tri-Sucrose Vaccine 05/08/2020   PFIZER(Purple Top)SARS-COV-2 Vaccination 11/08/2019, 11/29/2019   Pfizer Covid-19 Vaccine Bivalent Booster 68yrs & up 01/01/2021   Tdap 10/29/2018   Zoster Recombinat (Shingrix) 04/22/2021, 08/01/2021    Health Maintenance  Topic Date Due   COVID-19 Vaccine (6 - 2023-24 season) 07/10/2022   MAMMOGRAM  08/30/2022   HIV Screening  07/01/2026 (Originally 11/11/1985)   INFLUENZA VACCINE  10/30/2022   Fecal DNA (Cologuard)  01/19/2023   PAP SMEAR-Modifier  08/18/2023   DTaP/Tdap/Td (2 - Td or Tdap) 10/28/2028   Hepatitis C Screening  Completed   Zoster Vaccines- Shingrix  Completed   HPV VACCINES  Aged Out    Discussed health benefits of physical activity, and encouraged her to engage in regular exercise appropriate for her age and condition.  Marland KitchenArchie Patten was seen today for annual exam.  Diagnoses and all orders for this visit:  Routine physical examination  Elevated hemoglobin A1c  Class 3 severe obesity due to excess  calories with serious comorbidity and body mass index (BMI) of 45.0 to 49.9 in adult (HCC)  OSA on CPAP  Other orders -     AMBULATORY NON FORMULARY MEDICATION; Compression stocks to treat for chronic venous stasis.   Brings in sleep study testing- AHI 18.3, min 02 68PERCENT MAX 98PERCENT.  Abstract into chart Encouraged to use CPAP nightly, contact supplier if not likely the mask or have questions about settings.     Tandy Gaw, PA-C

## 2022-09-07 ENCOUNTER — Ambulatory Visit (INDEPENDENT_AMBULATORY_CARE_PROVIDER_SITE_OTHER): Payer: Federal, State, Local not specified - PPO

## 2022-09-07 DIAGNOSIS — M7521 Bicipital tendinitis, right shoulder: Secondary | ICD-10-CM

## 2022-09-08 DIAGNOSIS — I878 Other specified disorders of veins: Secondary | ICD-10-CM | POA: Insufficient documentation

## 2022-09-16 ENCOUNTER — Encounter: Payer: Self-pay | Admitting: Sports Medicine

## 2022-09-16 ENCOUNTER — Encounter: Payer: Self-pay | Admitting: Physician Assistant

## 2022-09-29 ENCOUNTER — Ambulatory Visit: Payer: Federal, State, Local not specified - PPO | Admitting: Sports Medicine

## 2022-09-29 ENCOUNTER — Other Ambulatory Visit (INDEPENDENT_AMBULATORY_CARE_PROVIDER_SITE_OTHER): Payer: Federal, State, Local not specified - PPO

## 2022-09-29 DIAGNOSIS — M7521 Bicipital tendinitis, right shoulder: Secondary | ICD-10-CM | POA: Diagnosis not present

## 2022-09-29 NOTE — Progress Notes (Signed)
    Procedures performed today:    Procedure: Real-time Ultrasound Guided injection of the right distal bicep sheath Device: Samsung HS60  Verbal informed consent obtained.  Time-out conducted.  Noted no overlying erythema, induration, or other signs of local infection.  Skin prepped in a sterile fashion.  Local anesthesia: Topical Ethyl chloride.  With sterile technique and under real time ultrasound guidance: Noted intact distal biceps tendon, I used a dorsal approach, 1 cc Kenalog 40, 1 cc lidocaine, 1 cc bupivacaine injected easily Completed without difficulty  Advised to call if fevers/chills, erythema, induration, drainage, or persistent bleeding.  Images permanently stored and available for review in PACS.  Impression: Technically successful ultrasound guided injection.  Independent interpretation of notes and tests performed by another provider:   I personally reviewed the elbow MRI, there is intense T2 signal around the distal biceps tendon along its course to the radial tuberosity.  Brief History, Exam, Impression, and Recommendations:    Biceps tendinitis, right, distal This pleasant 52 year old female with severe right elbow pain returns, we suspected more of a biceps tendinitis, ultimately an MRI showed intense T2 edema around the distal biceps tendon along its course to the radial tuberosity consistent with a bicipital radial bursitis/tendinopathy. Today I did an ultrasound-guided injection into the distal biceps tendon sheath from a posterior approach, return to see me in 6 weeks.    ____________________________________________ Ihor Austin. Benjamin Stain, M.D., ABFM., CAQSM., AME. Primary Care and Sports Medicine [facility]  Adjunct Professor of Family Medicine  [employer] [facility] [employer]  Restaurant manager, fast food

## 2022-09-29 NOTE — Assessment & Plan Note (Signed)
This pleasant 52 year old female with severe right elbow pain returns, we suspected more of a biceps tendinitis, ultimately an MRI showed intense T2 edema around the distal biceps tendon along its course to the radial tuberosity consistent with a bicipital radial bursitis/tendinopathy. Today I did an ultrasound-guided injection into the distal biceps tendon sheath from a posterior approach, return to see me in 6 weeks.

## 2022-10-14 ENCOUNTER — Ambulatory Visit: Payer: Federal, State, Local not specified - PPO | Admitting: Physician Assistant

## 2022-10-14 ENCOUNTER — Encounter: Payer: Self-pay | Admitting: Physician Assistant

## 2022-10-14 VITALS — BP 130/85 | HR 97 | Ht 66.0 in | Wt 287.2 lb

## 2022-10-14 DIAGNOSIS — E860 Dehydration: Secondary | ICD-10-CM

## 2022-10-14 DIAGNOSIS — L930 Discoid lupus erythematosus: Secondary | ICD-10-CM

## 2022-10-14 DIAGNOSIS — R61 Generalized hyperhidrosis: Secondary | ICD-10-CM | POA: Insufficient documentation

## 2022-10-14 DIAGNOSIS — R252 Cramp and spasm: Secondary | ICD-10-CM | POA: Insufficient documentation

## 2022-10-14 NOTE — Progress Notes (Unsigned)
Established Patient Office Visit  Subjective   Patient ID: Jennifer Erickson, female    DOB: [birth year]  Age: 52 y.o. MRN: [id number]  Chief Complaint  Patient presents with   Headache    Pt c/o  headaches, leg cramps,  feeling exhaused-  excessive sweating,  and skin tingling x 1 week . States A/C went out at work  . Patient states heat is exacerbating her Lupus symptoms and wanting to stop them before progress to Lupus sores.     HPI Pt is a 52 yo obese female with Lupus, OSA, who presents to the clinic to discuss work environment and her chronic issues. 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 She is excessively sweating to the point where she is getting bilateral leg cramps, headaches, fatigue. She is concerned about a lupus flare and even kidney function decline. She uses a small fan at work which helps some. She is drinking water throughout shift but does not seem to be helping with sweating and fatigue.   .. 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   Left shoulder pain 12/03/2016   Cough 07/03/2017   Elevated blood pressure reading 09/24/2017   Alopecia, 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   Effect of high altitude on sinuses 07/23/2018   Metabolic syndrome with hyperglycemia 07/23/2018   Lumbar degenerative disc disease 06/06/2019   Numbness and tingling in both hands  08/18/2019   Trochanteric bursitis, right hip 01/04/2020   PND (post-nasal drip) 01/24/2020   Pre-diabetes 09/17/2020   Laceration of scalp 04/23/2021   Radiculitis of right cervical region 10/11/2021   Viral infection 11/01/2021   Asteatosis 03/23/2019   Biceps tendinitis, right, distal 07/18/2022   Bilateral hamstring strain 07/18/2022   Elevated hemoglobin A1c 09/05/2022   Chronic venous stasis 09/08/2022   Sweating increase 10/14/2022   Leg cramps 10/14/2022   Resolved Ambulatory Problems    Diagnosis Date Noted   Headache(784.0) 08/19/2011   Uvular swelling 12/30/2011   Insomnia 12/30/2011   Dermatitis 05/26/2012   Obstructive sleep apnea 11/22/2012   Left lumbar radiculitis 01/25/2014   Flank pain 11/01/2014   Right-sided low back pain without sciatica 05/07/2015   Cramps of left lower extremity 11/28/2015   Polyarthralgia 02/18/2016   Toe fracture, right 04/15/2016   Lateral epicondylitis of both elbows 12/03/2016   Grief reaction 12/26/2016   Acute frontal sinusitis 06/18/2018   Annual physical exam 02/17/2018   Pain of left heel 10/19/2018   Past Medical History:  Diagnosis Date   Lupus (HCC)    Personal history of amenorrhea    Sinusitis      ROS   See HPI.  Objective:     BP 130/85   Pulse 97   Ht 5\' 6"  (1.676 m)   Wt 287 lb 4 oz (130.3 kg)   SpO2 97%   BMI 46.36 kg/m  BP Readings from Last 3 Encounters:  10/14/22 130/85  09/05/22 124/81  08/20/22 118/84   Wt Readings from Last 3 Encounters:  10/14/22 287 lb 4 oz (130.3 kg)  09/05/22 288 lb (130.6 kg)  06/27/22 285 lb (129.3 kg)      Physical Exam Constitutional:      Appearance: She is well-developed. She is obese.  HENT:     Head: Normocephalic.  Cardiovascular:     Rate and Rhythm: Normal rate and regular rhythm.  Pulmonary:     Effort: Pulmonary effort is normal.     Breath sounds: Normal breath sounds.  Neurological:     Mental Status: She is alert and oriented to person,  place, and time.  Psychiatric:        Mood and Affect: Mood normal.        Assessment & Plan:  Marland KitchenMarland KitchenTonya "[name]" was seen today for headache.  Diagnoses and all orders for this visit:  Leg cramps  Sweating increase  Discoid lupus erythematosus  Dehydration   Immune system is low and concern for flare due to stress and overheating.  Discussed signs of heat exhaustion.  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  Encouraged her to stay hydrated and drink liquid IV before shift Consider investing in a cooling vest  Start magnesium 400mg  at bedtime for leg cramps and better sleep    Return if symptoms worsen or fail to improve.    Tandy Gaw, PA-C

## 2022-10-14 NOTE — Patient Instructions (Addendum)
Start liquid IV at the start of of your shift Magnesium 400mg  at bedtime for leg cramps  Heat Exhaustion Heat exhaustion happens when the body gets overheated from hot weather, exercise, strenuous physical activity, dehydration, or a combination of these. It is important to treat heat exhaustion as soon as possible. If untreated, heat exhaustion can lead to heat stroke, which is a medical emergency and can be life-threatening. What are the causes? Common causes of heat exhaustion include: Exercising or doing strenuous labor or activity in hot or humid weather. Being exposed to very warm and poorly ventilated environments, such as living in a home without air conditioning or being in a car without good ventilation. Not drinking enough water, especially in hot or humid weather. Not having enough salts and minerals in the blood (electrolytes). What increases the risk? The following factors may make you more likely to develop this condition: Exercising beyond your fitness level in hot conditions. Exercising or working in hot weather when your body is not used to the heat. Wearing clothing that does not allow your sweat to evaporate. Drinking a lot of alcoholic beverages or beverages that have caffeine. This can lead to dehydration. Being age 52 or older. Being a child. Having certain chronic medical conditions, such as heart disease, poor circulation or other vascular diseases, sickle cell disease, high blood pressure, diabetes, lung disease, or cystic fibrosis. Being very overweight (obese). Taking certain medicines, such as those for high blood pressure, antihistamines, or tranquilizers. Using drugs such as cocaine, heroin, or amphetamines. What are the signs or symptoms? Symptoms of this condition include: Heavy sweating along with feeling weak, dizzy, light-headed, and nauseous. Vomiting. Red, blotchy rash over the body. This is also called prickly heat. Rapid heartbeat. Headache. Body  temperature over 102.73F (39C). Urine that is darker than normal. Muscle cramps, such as in the leg or side (flank). Moist, cool, and clammy skin. Fatigue. Thirst. Difficulty focusing or concentrating. Passing out. Signs and symptoms may develop suddenly or over time. How is this diagnosed? This condition may be diagnosed based on your symptoms, a physical exam, and history of heat exposure. How is this treated? This condition may be treated by: Immediately stopping physical activity. Moving to a cooler, shaded area with good ventilation and airflow, or to an air-conditioned environment. Removing all unnecessary clothing to expose as much skin to the air as possible. Using cooling fans and water-misting sprays to cool the body down. Drinking plenty of fluids, including sports drinks with electrolytes. Having cooling blankets placed on you to lower your body temperature. Giving you fluids through an IV in a hospital. Follow these instructions at home: If you think that you have heat exhaustion: Ask a friend or a family member to stay with you. Take a cool bath or shower. Drink enough fluid to keep your urine pale yellow. Lie down and rest. Return to your normal activities as told by your health care provider. Ask your health care provider what activities are safe for you. Contact a health care provider if: Symptoms last for more than 30 minutes. You feel your symptoms are not improving after taking steps to cool down. Get help right away if: You have any symptoms of heat stroke. These include: Temperature of 104F (40C) or higher. Diffusely red skin. Inability to sweat, resulting in hot, dry skin. Excessive thirst. Nausea and vomiting. Rapid breathing. Chest pain. Headache. Confusion or disorientation. Fainting. Seizure. These symptoms may represent a serious problem that is an emergency. Do not  wait to see if the symptoms will go away. Get medical help right away. Call  your local emergency services (911 in the [place] Do not drive yourself to the hospital. Summary Heat exhaustion happens when your body gets overheated from hot weather, strenuous activity, and dehydration. Symptoms include sweating, fatigue, muscle cramps, and headache. Treat heat exhaustion immediately by moving to a ventilated and cool environment, having cool water sprayed on as much of the skin as possible, removing all unnecessary clothing, and drinking fluids with electrolytes. If your symptoms last for more than 30 minutes, contact a health care provider. If untreated, heat exhaustion can lead to heat stroke, which is a medical emergency and can be life-threatening. This information is not intended to replace advice given to you by your health care provider. Make sure you discuss any questions you have with your health care provider. Document Revised: 09/12/2019 Document Reviewed: 09/12/2019 Elsevier Patient Education  2024 [employer]   Leg Cramps Leg cramps occur when one or more muscles tighten and a person has no control over it (involuntary muscle contraction). Muscle cramps are most common in the calf muscles of the leg. They can occur during exercise or at rest. Leg cramps are painful, and they may last for a few seconds to a few minutes. Cramps may return several times before they finally stop. Usually, leg cramps are not caused by a serious medical problem. In many cases, the cause is not known. Some common causes include: Excessive physical effort (overexertion), such as during intense exercise. Doing the same motion over and over. Staying in a certain position for a long period of time. Improper preparation, form, or technique while doing a sport or an activity. Dehydration. Injury. Side effects of certain medicines. Abnormally low levels of minerals in your blood (electrolytes), especially potassium and calcium. This could result from: Pregnancy. Taking diuretic  medicines. Follow these instructions at home: Eating and drinking Drink enough fluid to keep your urine pale yellow. Staying hydrated may help prevent cramps. Eat a healthy diet that includes plenty of nutrients to help your muscles function. A healthy diet includes fruits and vegetables, lean protein, whole grains, and low-fat or nonfat dairy products. Managing pain, stiffness, and swelling     Try massaging, stretching, and relaxing the affected muscle. Do this for several minutes at a time. If directed, put ice on areas that are sore or painful after a cramp. To do this: Put ice in a plastic bag. Place a towel between your skin and the bag. Leave the ice on for 20 minutes, 2-3 times a day. Remove the ice if your skin turns bright red. This is very important. If you cannot feel pain, heat, or cold, you have a greater risk of damage to the area. If directed, apply heat to muscles that are tense or tight. Do this before you exercise, or as often as told by your health care provider. Use the heat source that your health care provider recommends, such as a moist heat pack or a heating pad. To do this: Place a towel between your skin and the heat source. Leave the heat on for 20-30 minutes. Remove the heat if your skin turns bright red. This is especially important if you are unable to feel pain, heat, or cold. You may have a greater risk of getting burned. Try taking hot showers or baths to help relax tight muscles. General instructions If you are having frequent leg cramps, avoid intense exercise for several days. Take  over-the-counter and prescription medicines only as told by your health care provider. Keep all follow-up visits. This is important. Contact a health care provider if: Your leg cramps get more severe or more frequent, or they do not improve over time. Your foot becomes cold, numb, or blue. Summary Muscle cramps can develop in any muscle, but the most common place is in the  calf muscles of the leg. Leg cramps are painful, and they may last for a few seconds to a few minutes. Usually, leg cramps are not caused by a serious medical problem. Often, the cause is not known. Stay hydrated, and take over-the-counter and prescription medicines only as told by your health care provider. This information is not intended to replace advice given to you by your health care provider. Make sure you discuss any questions you have with your health care provider. Document Revised: 08/03/2019 Document Reviewed: 08/03/2019 Elsevier Patient Education  2024 [employer].

## 2022-10-15 ENCOUNTER — Encounter: Payer: Self-pay | Admitting: Physician Assistant

## 2022-10-21 ENCOUNTER — Encounter: Payer: Self-pay | Admitting: Family Medicine

## 2022-10-21 ENCOUNTER — Other Ambulatory Visit (HOSPITAL_BASED_OUTPATIENT_CLINIC_OR_DEPARTMENT_OTHER): Payer: Self-pay

## 2022-10-21 ENCOUNTER — Ambulatory Visit: Payer: Federal, State, Local not specified - PPO | Admitting: Family Medicine

## 2022-10-21 VITALS — BP 144/77 | HR 113 | Resp 18 | Ht 66.0 in | Wt 291.5 lb

## 2022-10-21 DIAGNOSIS — R0981 Nasal congestion: Secondary | ICD-10-CM

## 2022-10-21 DIAGNOSIS — R051 Acute cough: Secondary | ICD-10-CM

## 2022-10-21 DIAGNOSIS — J329 Chronic sinusitis, unspecified: Secondary | ICD-10-CM

## 2022-10-21 DIAGNOSIS — J40 Bronchitis, not specified as acute or chronic: Secondary | ICD-10-CM

## 2022-10-21 LAB — POCT INFLUENZA A/B
Influenza A, POC: NEGATIVE
Influenza B, POC: NEGATIVE

## 2022-10-21 LAB — POC COVID19 BINAXNOW: SARS Coronavirus 2 Ag: NEGATIVE

## 2022-10-21 MED ORDER — METHYLPREDNISOLONE 4 MG PO TBPK
ORAL_TABLET | ORAL | 0 refills | Status: DC
  Filled 2022-10-21: qty 21, 6d supply, fill #0

## 2022-10-21 MED ORDER — AZITHROMYCIN 250 MG PO TABS
ORAL_TABLET | ORAL | 0 refills | Status: AC
  Filled 2022-10-21: qty 6, 5d supply, fill #0

## 2022-10-21 NOTE — Progress Notes (Unsigned)
Established patient visit   Patient: Jennifer Erickson   DOB: [birth year]   52 y.o. Female  MRN: [id number] Visit Date: 10/21/2022  Today's healthcare provider: Charlton Amor, DO   Chief Complaint  Patient presents with   sick visit    Pt comes in today with of complaints of cough,congestion and SOB x3days    SUBJECTIVE    Chief Complaint  Patient presents with   sick visit    Pt comes in today with of complaints of cough,congestion and SOB x3days   HPI HPI     sick visit    Additional comments: Pt comes in today with of complaints of cough,congestion and SOB x3days      Last edited by Roselyn Reef, CMA on 10/21/2022  3:31 PM.      Pt presents today for cough, cold, congestion for three days. She feels weak. Says she is having some green nasal discharge.   Review of Systems  Constitutional:  Positive for fatigue. Negative for activity change and fever.  Respiratory:  Negative for cough and shortness of breath.   Cardiovascular:  Negative for chest pain.  Gastrointestinal:  Negative for abdominal pain.  Genitourinary:  Negative for difficulty urinating.       Current Meds  Medication Sig   AMBULATORY NON FORMULARY MEDICATION Compression stocks to treat for chronic venous stasis.   azithromycin (ZITHROMAX) 250 MG tablet Take 2 tablets on day 1, then 1 tablet daily on days 2 through 5   Continuous Glucose Sensor (FREESTYLE LIBRE 14 DAY SENSOR) MISC APPLY TO UPPER DELTOID EVERY 14 DAYS. USE WITH READER TO DETERMINE BLOOD SUGARS   etonogestrel (NEXPLANON) 68 MG IMPL implant 1 each by Subdermal route once.   hydroxychloroquine (PLAQUENIL) 200 MG tablet Take 1 tablet (200 mg total) by mouth 2 (two) times daily. With food or milk   methylPREDNISolone (MEDROL DOSEPAK) 4 MG TBPK tablet Take 6 tablets (60 mg total) by mouth on day 1, then 5 tablets (50 mg total) on day 2, then 4 tablets (40 mg total) on day 3, then 3 tablets (30 mg total) on day 4, then 2 tablets (20  mg total) on day 5, and then 1 tablet (10 mg total) on day 6.   Multiple Vitamins-Minerals (ZINC PO) Take by mouth as needed.   VITAMIN D PO Take by mouth.    OBJECTIVE    BP (!) 144/77 (BP Location: Left Arm, Patient Position: Sitting, Cuff Size: Large)   Pulse (!) 113   Resp 18   Ht 5\' 6"  (1.676 m)   Wt 291 lb 8 oz (132.2 kg)   SpO2 99%   BMI 47.05 kg/m   Physical Exam Vitals and nursing note reviewed.  Constitutional:      General: She is not in acute distress.    Appearance: Normal appearance.  HENT:     Head: Normocephalic and atraumatic.     Right Ear: External ear normal.     Left Ear: External ear normal.     Nose: Nose normal.  Eyes:     Conjunctiva/sclera: Conjunctivae normal.  Cardiovascular:     Rate and Rhythm: Normal rate and regular rhythm.  Pulmonary:     Effort: Pulmonary effort is normal.     Breath sounds: Normal breath sounds.  Neurological:     General: No focal deficit present.     Mental Status: She is alert and oriented to person, place, and time.  Psychiatric:  Mood and Affect: Mood normal.        Behavior: Behavior normal.        Thought Content: Thought content normal.        Judgment: Judgment normal.        ASSESSMENT & PLAN    Problem List Items Addressed This Visit       Respiratory   Sinobronchitis    Pt notes congestion associated with cough for three days. Covid and flu tests were negative  - have gone ahead and given a zpack and steroids to help       Relevant Medications   azithromycin (ZITHROMAX) 250 MG tablet   methylPREDNISolone (MEDROL DOSEPAK) 4 MG TBPK tablet     Other   Cough - Primary   Relevant Orders   POCT Influenza A/B   POC COVID-19   Other Visit Diagnoses     Congested nose       Relevant Orders   POCT Influenza A/B   POC COVID-19       Return if symptoms worsen or fail to improve.      Meds ordered this encounter  Medications   azithromycin (ZITHROMAX) 250 MG tablet    Sig: Take  2 tablets on day 1, then 1 tablet daily on days 2 through 5    Dispense:  6 tablet    Refill:  0   methylPREDNISolone (MEDROL DOSEPAK) 4 MG TBPK tablet    Sig: Take 6 tablets (60 mg total) by mouth on day 1, then 5 tablets (50 mg total) on day 2, then 4 tablets (40 mg total) on day 3, then 3 tablets (30 mg total) on day 4, then 2 tablets (20 mg total) on day 5, and then 1 tablet (10 mg total) on day 6.    Dispense:  21 tablet    Refill:  0    Orders Placed This Encounter  Procedures   POCT Influenza A/B   POC COVID-19    Order Specific Question:   Previously tested for COVID-19    Answer:   Yes    Order Specific Question:   Resident in a congregate (group) care setting    Answer:   No    Order Specific Question:   Employed in healthcare setting    Answer:   No    Order Specific Question:   Pregnant    Answer:   No     Charlton Amor, DO  [facility] at [facility] [phone] (phone) [phone] (fax)  [facility]

## 2022-10-21 NOTE — Assessment & Plan Note (Signed)
Pt notes congestion associated with cough for three days. Covid and flu tests were negative  - have gone ahead and given a zpack and steroids to help

## 2022-10-22 ENCOUNTER — Encounter: Payer: Self-pay | Admitting: Family Medicine

## 2022-10-22 ENCOUNTER — Other Ambulatory Visit: Payer: Self-pay | Admitting: Family Medicine

## 2022-11-06 ENCOUNTER — Other Ambulatory Visit: Payer: Self-pay | Admitting: Physician Assistant

## 2022-11-06 ENCOUNTER — Other Ambulatory Visit (HOSPITAL_BASED_OUTPATIENT_CLINIC_OR_DEPARTMENT_OTHER): Payer: Self-pay

## 2022-11-07 ENCOUNTER — Other Ambulatory Visit (HOSPITAL_BASED_OUTPATIENT_CLINIC_OR_DEPARTMENT_OTHER): Payer: Self-pay

## 2022-11-07 MED ORDER — FREESTYLE LIBRE 14 DAY SENSOR MISC
11 refills | Status: DC
  Filled 2022-11-07 – 2022-11-21 (×2): qty 2, 28d supply, fill #0
  Filled 2022-12-19: qty 2, 28d supply, fill #1
  Filled 2023-01-23: qty 2, 28d supply, fill #2
  Filled 2023-03-04: qty 2, 28d supply, fill #3
  Filled 2023-04-02: qty 2, 28d supply, fill #4
  Filled 2023-04-18 – 2023-04-27 (×4): qty 2, 28d supply, fill #5
  Filled 2023-06-03: qty 2, 28d supply, fill #6
  Filled 2023-07-01: qty 2, 28d supply, fill #7
  Filled 2023-07-31: qty 2, 28d supply, fill #8

## 2022-11-10 ENCOUNTER — Ambulatory Visit: Payer: Federal, State, Local not specified - PPO | Admitting: Sports Medicine

## 2022-11-18 ENCOUNTER — Other Ambulatory Visit (HOSPITAL_BASED_OUTPATIENT_CLINIC_OR_DEPARTMENT_OTHER): Payer: Self-pay

## 2022-11-21 ENCOUNTER — Other Ambulatory Visit (HOSPITAL_BASED_OUTPATIENT_CLINIC_OR_DEPARTMENT_OTHER): Payer: Self-pay

## 2022-11-24 ENCOUNTER — Other Ambulatory Visit (HOSPITAL_BASED_OUTPATIENT_CLINIC_OR_DEPARTMENT_OTHER): Payer: Self-pay

## 2022-11-27 ENCOUNTER — Encounter: Payer: Self-pay | Admitting: Sports Medicine

## 2022-12-10 ENCOUNTER — Other Ambulatory Visit (HOSPITAL_BASED_OUTPATIENT_CLINIC_OR_DEPARTMENT_OTHER): Payer: Self-pay

## 2022-12-10 MED ORDER — FLULAVAL 0.5 ML IM SUSY
0.3000 mL | PREFILLED_SYRINGE | Freq: Once | INTRAMUSCULAR | 0 refills | Status: AC
  Filled 2022-12-10: qty 0.5, 1d supply, fill #0

## 2022-12-10 MED ORDER — COVID-19 MRNA VAC-TRIS(PFIZER) 30 MCG/0.3ML IM SUSY
0.3000 mL | PREFILLED_SYRINGE | Freq: Once | INTRAMUSCULAR | 0 refills | Status: AC
  Filled 2022-12-10: qty 0.3, 1d supply, fill #0

## 2022-12-15 ENCOUNTER — Ambulatory Visit
Admission: RE | Admit: 2022-12-15 | Discharge: 2022-12-15 | Disposition: A | Discharge status: Home / Self Care | Payer: Federal, State, Local not specified - PPO | Source: Ambulatory Visit | Attending: Emergency Medicine | Admitting: Emergency Medicine

## 2022-12-15 ENCOUNTER — Other Ambulatory Visit (HOSPITAL_BASED_OUTPATIENT_CLINIC_OR_DEPARTMENT_OTHER): Payer: Self-pay

## 2022-12-15 VITALS — BP 137/89 | HR 93 | Temp 98.3°F | Resp 18

## 2022-12-15 DIAGNOSIS — R8290 Unspecified abnormal findings in urine: Secondary | ICD-10-CM | POA: Diagnosis not present

## 2022-12-15 DIAGNOSIS — J069 Acute upper respiratory infection, unspecified: Secondary | ICD-10-CM

## 2022-12-15 DIAGNOSIS — J040 Acute laryngitis: Secondary | ICD-10-CM | POA: Diagnosis not present

## 2022-12-15 LAB — POCT URINALYSIS DIP (MANUAL ENTRY)
Bilirubin, UA: NEGATIVE
Glucose, UA: >=1000 mg/dL — AB
Ketones, POC UA: NEGATIVE mg/dL
Leukocytes, UA: NEGATIVE
Nitrite, UA: NEGATIVE
Protein Ur, POC: NEGATIVE mg/dL
Spec Grav, UA: 1.015 (ref 1.010–1.025)
Urobilinogen, UA: 1 U/dL
pH, UA: 5.5 (ref 5.0–8.0)

## 2022-12-15 MED ORDER — PROMETHAZINE-DM 6.25-15 MG/5ML PO SYRP
5.0000 mL | ORAL_SOLUTION | Freq: Four times a day (QID) | ORAL | 0 refills | Status: DC | PRN
  Filled 2022-12-15 (×2): qty 240, 12d supply, fill #0

## 2022-12-15 NOTE — ED Provider Notes (Signed)
[facility]    CSN: [id number] Arrival date & time: 12/15/22  1033     History   Chief Complaint Chief Complaint  Patient presents with   Cough   Dysuria    HPI Jennifer Erickson is a 52 y.o. female.  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 No dysuria, hematuria, frequency/urgency, flank pain  Past Medical History:  Diagnosis Date   Lupus (HCC)    Personal history of amenorrhea    Sinusitis     Patient Active Problem List   Diagnosis Date Noted   Sinobronchitis 10/21/2022   Sweating increase 10/14/2022   Leg cramps 10/14/2022   Chronic venous stasis 09/08/2022   Elevated hemoglobin A1c 09/05/2022   Biceps tendinitis, right, distal 07/18/2022   Bilateral hamstring strain 07/18/2022   Viral infection 11/01/2021   Radiculitis of right cervical region 10/11/2021   Laceration of scalp 04/23/2021   Pre-diabetes 09/17/2020   PND (post-nasal drip) 01/24/2020   Trochanteric bursitis, right hip 01/04/2020   Numbness and tingling in both hands 08/18/2019   Lumbar degenerative disc disease 06/06/2019   Asteatosis 03/23/2019   Effect of high altitude on sinuses 07/23/2018   Metabolic syndrome with hyperglycemia 07/23/2018   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   Alopecia, 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   Elevated blood pressure reading 09/24/2017   Cough 07/03/2017   Left shoulder pain 12/03/2016   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   Palpitations 07/11/2015   OSA on CPAP 07/10/2015   Large breasts 05/28/2015   Left tennis elbow 05/28/2015   Primary osteoarthritis of both knees 12/08/2014   Vitamin D deficiency 04/19/2014   Bilateral swelling of feet 05/02/2013   Left breast mass 11/22/2012   Plantar fasciitis,  bilateral 11/01/2012   Class 3 severe obesity due to excess calories with serious comorbidity and body mass index (BMI) of 45.0 to 49.9 in adult [facility]) 08/19/2011   Uvular swelling 08/19/2011    Past Surgical History:  Procedure Laterality Date   DILATION AND CURETTAGE OF UTERUS     nexplanon insertion     TONSILLECTOMY      OB History     Gravida  0   Para  0   Term  0   Preterm  0   AB  0   Living  0      SAB  0   IAB  0   Ectopic  0   Multiple  0   Live Births  0            Home Medications    Prior to Admission medications   Medication Sig Start Date End Date Taking? Authorizing Provider  promethazine-dextromethorphan (PROMETHAZINE-DM) 6.25-15 MG/5ML syrup Take 5 mLs by mouth 4 (four) times daily as needed for cough. 12/15/22  Yes Lakyla Biswas, PA-C  AMBULATORY NON FORMULARY MEDICATION Compression stocks to treat for chronic venous stasis. 09/05/22   Breeback, Jade L, PA-C  Continuous Glucose Sensor (FREESTYLE LIBRE 14 DAY SENSOR) MISC APPLY TO UPPER DELTOID EVERY 14 DAYS. USE WITH READER TO DETERMINE BLOOD SUGARS 11/07/22   Breeback, Jade L, PA-C  etonogestrel (NEXPLANON) 68 MG IMPL implant 1 each by Subdermal route once. 12/25/21   [provider]  hydroxychloroquine (PLAQUENIL) 200 MG tablet Take 1 tablet (  200 mg total) by mouth 2 (two) times daily. With food or milk 08/22/22     methylPREDNISolone (MEDROL DOSEPAK) 4 MG TBPK tablet Take 6 tablets (60 mg total) by mouth on day 1, then 5 tablets (50 mg total) on day 2, then 4 tablets (40 mg total) on day 3, then 3 tablets (30 mg total) on day 4, then 2 tablets (20 mg total) on day 5, and then 1 tablet (10 mg total) on day 6. 10/21/22   Charlton Amor, DO  Multiple Vitamins-Minerals (ZINC PO) Take by mouth as needed.    [provider]  VITAMIN D PO Take by mouth.    [provider]    Family History Family History  Problem Relation Age of Onset   Cancer Mother        brain    Hypertension Father    Diabetes Brother     Social History Social History   Tobacco Use   Smoking status: Never   Smokeless tobacco: Never  Vaping Use   Vaping status: Never Used  Substance Use Topics   Alcohol use: No   Drug use: No     Allergies   Aspirin, Augmentin [amoxicillin-pot clavulanate], Ciprofloxacin, Hydrocodone, Ivp dye [iodinated contrast media], and Sulfa antibiotics   Review of Systems Review of Systems Per HPI  Physical Exam Triage Vital Signs ED Triage Vitals  Encounter Vitals Group     BP 12/15/22 1044 137/89     Systolic BP Percentile --      Diastolic BP Percentile --      Pulse Rate 12/15/22 1044 93     Resp 12/15/22 1044 18     Temp 12/15/22 1044 98.3 F (36.8 C)     Temp Source 12/15/22 1044 Oral     SpO2 12/15/22 1044 100 %     Weight --      Height --      Head Circumference --      Peak Flow --      Pain Score 12/15/22 1042 0     Pain Loc --      Pain Education --      Exclude from Growth Chart --    No data found.  Updated Vital Signs BP 137/89 (BP Location: Right Arm)   Pulse 93   Temp 98.3 F (36.8 C) (Oral)   Resp 18   SpO2 100%     Physical Exam Vitals and nursing note reviewed.  Constitutional:      General: She is not in acute distress.    Appearance: She is not ill-appearing.  HENT:     Right Ear: Tympanic membrane and ear canal normal.     Left Ear: Tympanic membrane and ear canal normal.     Nose: No congestion or rhinorrhea.     Mouth/Throat:     Mouth: Mucous membranes are moist.     Pharynx: Oropharynx is clear. No oropharyngeal exudate or posterior oropharyngeal erythema.     Comments: Hoarse  No tonsils  Eyes:     Conjunctiva/sclera: Conjunctivae normal.  Cardiovascular:     Rate and Rhythm: Normal rate and regular rhythm.     Pulses: Normal pulses.     Heart sounds: Normal heart sounds.  Pulmonary:     Effort: Pulmonary effort is normal.     Breath sounds: Normal breath sounds.     Comments:  Clear lungs throughout. Infrequent dry cough Abdominal:     Palpations: Abdomen is soft.  Tenderness: There is no abdominal tenderness. There is no right CVA tenderness or left CVA tenderness.     Comments: Habitus limits exam  Musculoskeletal:     Cervical back: Normal range of motion.  Lymphadenopathy:     Cervical: No cervical adenopathy.  Skin:    General: Skin is warm and dry.  Neurological:     Mental Status: She is alert and oriented to person, place, and time.      UC Treatments / Results  Labs (all labs ordered are listed, but only abnormal results are displayed) Labs Reviewed  POCT URINALYSIS DIP (MANUAL ENTRY) - Abnormal; Notable for the following components:      Result Value   Glucose, UA >=1,000 (*)    Blood, UA trace-intact (*)    All other components within normal limits  URINE CULTURE  SARS CORONAVIRUS 2 (TAT 6-24 HRS)    EKG  Radiology No results found.  Procedures Procedures (including critical care time)  Medications Ordered in UC Medications - No data to display  Initial Impression / Assessment and Plan / UC Course  I have reviewed the triage vital signs and the nursing notes.  Pertinent labs & imaging results that were available during my care of the patient were reviewed by me and considered in my medical decision making (see chart for details).  Afebrile.  UA trace RBC, lots of glucose. Will culture. No concern for UTI at this time but will confirm. Increase fluids. Denies history of diabetes although at physical in June it is documented she has elevated A1c. I cannot see the value.   Covid test pending Discussed viral etiology, symptomatic care for URI and laryngitis.  Patient is frustrated she used the [place] and it did not help.  We discussed she is only on day 2 of symptoms and it can unfortunately take time for symptoms to improve.  Can try promethazine DM QID prn. Follow closely with primary care. Return and ED precautions  Final  Clinical Impressions(s) / UC Diagnoses   Final diagnoses:  Viral URI with cough  Laryngitis     Discharge Instructions      Try the cough syrup up to 4x daily.  If this makes you drowsy, take only before bedtime You can also try plain Mucinex (guaifenesin) to help with mucus. Works best if you are very hydrated!  Salt water gargles, lozenges, honey, and vocal rest for the hoarse voice. It can take several days to improve.   We will call you if your covid test returns positive, or if anything grows on urine culture.     ED Prescriptions     Medication Sig Dispense Auth. Provider   promethazine-dextromethorphan (PROMETHAZINE-DM) 6.25-15 MG/5ML syrup Take 5 mLs by mouth 4 (four) times daily as needed for cough. 240 mL Hendrixx Severin, Lurena Joiner, PA-C      PDMP not reviewed this encounter.   Marciel Offenberger, Ray Church 12/15/22 1219

## 2022-12-15 NOTE — ED Triage Notes (Signed)
Pt c/o cough and hoarse voice since yesterday. Says she got her flu and covid vaccines a few days ago. Taking tessalon and cough drops prn. Also having some dysuria. Would like to be checked for UTI.

## 2022-12-15 NOTE — Discharge Instructions (Addendum)
Try the cough syrup up to 4x daily.  If this makes you drowsy, take only before bedtime You can also try plain Mucinex (guaifenesin) to help with mucus. Works best if you are very hydrated!  Salt water gargles, lozenges, honey, and vocal rest for the hoarse voice. It can take several days to improve.   We will call you if your covid test returns positive, or if anything grows on urine culture.

## 2022-12-16 ENCOUNTER — Other Ambulatory Visit: Payer: Self-pay

## 2022-12-16 LAB — URINE CULTURE: Culture: NO GROWTH

## 2022-12-16 LAB — SARS CORONAVIRUS 2 (TAT 6-24 HRS): SARS Coronavirus 2: NEGATIVE

## 2022-12-22 ENCOUNTER — Other Ambulatory Visit: Payer: Self-pay

## 2022-12-22 ENCOUNTER — Other Ambulatory Visit (HOSPITAL_BASED_OUTPATIENT_CLINIC_OR_DEPARTMENT_OTHER): Payer: Self-pay

## 2023-01-02 ENCOUNTER — Other Ambulatory Visit (HOSPITAL_BASED_OUTPATIENT_CLINIC_OR_DEPARTMENT_OTHER): Payer: Self-pay

## 2023-01-27 ENCOUNTER — Other Ambulatory Visit (HOSPITAL_BASED_OUTPATIENT_CLINIC_OR_DEPARTMENT_OTHER): Payer: Self-pay

## 2023-01-27 ENCOUNTER — Ambulatory Visit: Payer: Federal, State, Local not specified - PPO | Admitting: Physician Assistant

## 2023-01-27 VITALS — BP 131/86 | HR 96 | Temp 98.1°F | Ht 66.0 in | Wt 277.0 lb

## 2023-01-27 DIAGNOSIS — J40 Bronchitis, not specified as acute or chronic: Secondary | ICD-10-CM | POA: Diagnosis not present

## 2023-01-27 MED ORDER — PREDNISONE 20 MG PO TABS
20.0000 mg | ORAL_TABLET | Freq: Two times a day (BID) | ORAL | 0 refills | Status: DC
  Filled 2023-01-27: qty 10, 5d supply, fill #0

## 2023-01-27 MED ORDER — DOXYCYCLINE HYCLATE 100 MG PO TABS
100.0000 mg | ORAL_TABLET | Freq: Two times a day (BID) | ORAL | 0 refills | Status: DC
  Filled 2023-01-27: qty 20, 10d supply, fill #0

## 2023-01-27 MED ORDER — PROMETHAZINE-DM 6.25-15 MG/5ML PO SYRP
5.0000 mL | ORAL_SOLUTION | Freq: Four times a day (QID) | ORAL | 0 refills | Status: DC | PRN
  Filled 2023-01-27: qty 240, 12d supply, fill #0

## 2023-01-27 NOTE — Progress Notes (Unsigned)
Acute Office Visit  Subjective:     Patient ID: ELLISON BARO, female    DOB: [birth year], 52 y.o.   MRN: [id number]  Chief Complaint  Patient presents with  . Cough    HPI Patient is in today for ***  ROS      Objective:    BP 131/86   Pulse 96   Temp 98.1 F (36.7 C) (Oral)   Ht 5\' 6"  (1.676 m)   Wt 277 lb (125.6 kg)   SpO2 94%   BMI 44.71 kg/m  {Vitals History (Optional):23777}  Physical Exam  No results found for any visits on 01/27/23.      Assessment & Plan:   Problem List Items Addressed This Visit   None   Meds ordered this encounter  Medications  . promethazine-dextromethorphan (PROMETHAZINE-DM) 6.25-15 MG/5ML syrup    Sig: Take 5 mLs by mouth 4 (four) times daily as needed for cough.    Dispense:  240 mL    Refill:  0    Order Specific Question:   Supervising Provider    Answer:   Nani Gasser D [[id number]]  . doxycycline (VIBRA-TABS) 100 MG tablet    Sig: Take 1 tablet (100 mg total) by mouth 2 (two) times daily. For 10 days.    Dispense:  20 tablet    Refill:  0    Order Specific Question:   Supervising Provider    Answer:   Nani Gasser D [[id number]]  . predniSONE (DELTASONE) 20 MG tablet    Sig: Take 1 tablet (20 mg total) by mouth 2 (two) times daily with a meal.    Dispense:  10 tablet    Refill:  0    Order Specific Question:   Supervising Provider    Answer:   Agapito Games [[id number]]    Return if symptoms worsen or fail to improve.  Tandy Gaw, PA-C

## 2023-01-27 NOTE — Patient Instructions (Signed)
Cough syrup Prednisone and antibiotic

## 2023-01-28 ENCOUNTER — Encounter: Payer: Self-pay | Admitting: Physician Assistant

## 2023-02-03 ENCOUNTER — Ambulatory Visit: Payer: Federal, State, Local not specified - PPO | Admitting: Sports Medicine

## 2023-02-10 ENCOUNTER — Other Ambulatory Visit (HOSPITAL_BASED_OUTPATIENT_CLINIC_OR_DEPARTMENT_OTHER): Payer: Self-pay

## 2023-02-10 ENCOUNTER — Ambulatory Visit
Admission: EM | Admit: 2023-02-10 | Discharge: 2023-02-10 | Disposition: A | Discharge status: Home / Self Care | Payer: Federal, State, Local not specified - PPO | Attending: Family Medicine | Admitting: Family Medicine

## 2023-02-10 ENCOUNTER — Telehealth: Payer: Self-pay

## 2023-02-10 ENCOUNTER — Ambulatory Visit: Payer: Federal, State, Local not specified - PPO

## 2023-02-10 ENCOUNTER — Telehealth: Payer: Self-pay | Admitting: Family Medicine

## 2023-02-10 DIAGNOSIS — R0989 Other specified symptoms and signs involving the circulatory and respiratory systems: Secondary | ICD-10-CM | POA: Diagnosis not present

## 2023-02-10 DIAGNOSIS — R059 Cough, unspecified: Secondary | ICD-10-CM

## 2023-02-10 DIAGNOSIS — J309 Allergic rhinitis, unspecified: Secondary | ICD-10-CM

## 2023-02-10 DIAGNOSIS — R058 Other specified cough: Secondary | ICD-10-CM | POA: Diagnosis not present

## 2023-02-10 MED ORDER — PREDNISONE 20 MG PO TABS
ORAL_TABLET | ORAL | 0 refills | Status: DC
  Filled 2023-02-10 (×2): qty 15, 5d supply, fill #0

## 2023-02-10 MED ORDER — AZITHROMYCIN 250 MG PO TABS
250.0000 mg | ORAL_TABLET | Freq: Every day | ORAL | 0 refills | Status: DC
  Filled 2023-02-10: qty 6, 6d supply, fill #0
  Filled 2023-02-10: qty 6, 5d supply, fill #0

## 2023-02-10 MED ORDER — FEXOFENADINE HCL 180 MG PO TABS
180.0000 mg | ORAL_TABLET | Freq: Every day | ORAL | 0 refills | Status: DC
  Filled 2023-02-10: qty 30, 30d supply, fill #0

## 2023-02-10 MED ORDER — BENZONATATE 200 MG PO CAPS
200.0000 mg | ORAL_CAPSULE | Freq: Three times a day (TID) | ORAL | 0 refills | Status: AC | PRN
  Filled 2023-02-10: qty 40, 14d supply, fill #0

## 2023-02-10 NOTE — ED Provider Notes (Signed)
[name]    CSN: [id number] Arrival date & time: 02/10/23  1332      History   Chief Complaint Chief Complaint  Patient presents with   Cough   Nasal Congestion    HPI Jennifer Erickson is a 52 y.o. female.   HPI 52 year old female presents with nasal congestion and cough worsening for the past 3 days.  Patient reports was treated recently with antibiotics, steroids, and cough medicine.  Patient reports cough has worsened and has history of bronchitis and pneumonia.  PMH significant for morbid obesity, cough, and metabolic syndrome with hyperglycemia.  Past Medical History:  Diagnosis Date   Lupus    Personal history of amenorrhea    Sinusitis     Patient Active Problem List   Diagnosis Date Noted   Sinobronchitis 10/21/2022   Sweating increase 10/14/2022   Leg cramps 10/14/2022   Chronic venous stasis 09/08/2022   Elevated hemoglobin A1c 09/05/2022   Biceps tendinitis, right, distal 07/18/2022   Bilateral hamstring strain 07/18/2022   Viral infection 11/01/2021   Radiculitis of right cervical region 10/11/2021   Laceration of scalp 04/23/2021   Pre-diabetes 09/17/2020   PND (post-nasal drip) 01/24/2020   Trochanteric bursitis, right hip 01/04/2020   Numbness and tingling in both hands 08/18/2019   Lumbar degenerative disc disease 06/06/2019   Asteatosis 03/23/2019   Effect of high altitude on sinuses 07/23/2018   Metabolic syndrome with hyperglycemia 07/23/2018   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   Alopecia, 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   Elevated blood pressure reading 09/24/2017   Cough 07/03/2017   Left shoulder pain 12/03/2016   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   Palpitations 07/11/2015   OSA on CPAP 07/10/2015   Large breasts 05/28/2015   Left tennis elbow 05/28/2015   Primary osteoarthritis of both knees 12/08/2014   Vitamin D deficiency 04/19/2014   Bilateral swelling of feet 05/02/2013    Left breast mass 11/22/2012   Plantar fasciitis, bilateral 11/01/2012   Class 3 severe obesity due to excess calories with serious comorbidity and body mass index (BMI) of 45.0 to 49.9 in adult [facility]) 08/19/2011   Uvular swelling 08/19/2011    Past Surgical History:  Procedure Laterality Date   DILATION AND CURETTAGE OF UTERUS     nexplanon insertion     TONSILLECTOMY      OB History     Gravida  0   Para  0   Term  0   Preterm  0   AB  0   Living  0      SAB  0   IAB  0   Ectopic  0   Multiple  0   Live Births  0            Home Medications    Prior to Admission medications   Medication Sig Start Date End Date Taking? Authorizing Provider  benzonatate (TESSALON) 200 MG capsule Take 1 capsule (200 mg total) by mouth 3 (three) times daily as needed for up to 7 days. 02/10/23 02/24/23 Yes Trevor Iha, FNP  fexofenadine [facility] ALLERGY) 180 MG tablet Take 1 tablet (180 mg total) by mouth daily 02/10/23  Yes Trevor Iha, FNP  AMBULATORY NON FORMULARY MEDICATION Compression stocks to treat for chronic venous stasis. 09/05/22   Breeback, Jade L, PA-C  Continuous Glucose Sensor (FREESTYLE LIBRE 14 DAY SENSOR) MISC APPLY TO UPPER DELTOID EVERY 14 DAYS. USE WITH READER TO DETERMINE BLOOD SUGARS  11/07/22   Breeback, Lonna Cobb, PA-C  etonogestrel (NEXPLANON) 68 MG IMPL implant 1 each by Subdermal route once. 12/25/21   [provider]  hydroxychloroquine (PLAQUENIL) 200 MG tablet Take 1 tablet (200 mg total) by mouth 2 (two) times daily. With food or milk 08/22/22     Multiple Vitamins-Minerals (ZINC PO) Take by mouth as needed.    [provider]  VITAMIN D PO Take by mouth.    [provider]    Family History Family History  Problem Relation Age of Onset   Cancer Mother        brain   Hypertension Father    Diabetes Brother     Social History Social History   Tobacco Use   Smoking status: Never   Smokeless tobacco: Never   Vaping Use   Vaping status: Never Used  Substance Use Topics   Alcohol use: No   Drug use: No     Allergies   Aspirin, Augmentin [amoxicillin-pot clavulanate], Ciprofloxacin, Hydrocodone, Ivp dye [iodinated contrast media], and Sulfa antibiotics   Review of Systems Review of Systems  Respiratory:  Positive for cough.      Physical Exam Triage Vital Signs ED Triage Vitals  Encounter Vitals Group     BP 02/10/23 1357 (!) 152/91     Systolic BP Percentile --      Diastolic BP Percentile --      Pulse Rate 02/10/23 1357 95     Resp 02/10/23 1357 18     Temp 02/10/23 1357 98.5 F (36.9 C)     Temp Source 02/10/23 1357 Oral     SpO2 02/10/23 1357 96 %     Weight --      Height --      Head Circumference --      Peak Flow --      Pain Score 02/10/23 1355 0     Pain Loc --      Pain Education --      Exclude from Growth Chart --    No data found.  Updated Vital Signs BP (!) 152/91 (BP Location: Right Arm)   Pulse 95   Temp 98.5 F (36.9 C) (Oral)   Resp 18   SpO2 96%      Physical Exam Vitals and nursing note reviewed.  Constitutional:      Appearance: Normal appearance. She is obese.  HENT:     Head: Normocephalic and atraumatic.     Right Ear: Tympanic membrane, ear canal and external ear normal.     Left Ear: Tympanic membrane, ear canal and external ear normal.     Nose: Nose normal.     Mouth/Throat:     Mouth: Mucous membranes are moist.     Pharynx: Oropharynx is clear.  Eyes:     Extraocular Movements: Extraocular movements intact.     Conjunctiva/sclera: Conjunctivae normal.     Pupils: Pupils are equal, round, and reactive to light.  Cardiovascular:     Rate and Rhythm: Normal rate and regular rhythm.     Pulses: Normal pulses.     Heart sounds: Normal heart sounds.  Pulmonary:     Effort: Pulmonary effort is normal.     Breath sounds: Normal breath sounds. No wheezing, rhonchi or rales.  Musculoskeletal:        General: Normal range of  motion.     Cervical back: Normal range of motion and neck supple.  Skin:    General: Skin  is warm and dry.  Neurological:     General: No focal deficit present.     Mental Status: She is alert and oriented to person, place, and time. Mental status is at baseline.  Psychiatric:        Mood and Affect: Mood normal.        Behavior: Behavior normal.        Thought Content: Thought content normal.      UC Treatments / Results  Labs (all labs ordered are listed, but only abnormal results are displayed) Labs Reviewed - No data to display  EKG   Radiology DG Chest 2 View  Result Date: 02/10/2023 CLINICAL DATA:  Productive cough and chest congestion for 1 week. EXAM: CHEST - 2 VIEW COMPARISON:  08/29/2022 FINDINGS: The heart size and mediastinal contours are within normal limits. Both lungs are clear. The visualized skeletal structures are unremarkable. IMPRESSION: No active cardiopulmonary disease. Electronically Signed   By: Danae Orleans M.D.   On: 02/10/2023 17:23    Procedures Procedures (including critical care time)  Medications Ordered in UC Medications - No data to display  Initial Impression / Assessment and Plan / UC Course  I have reviewed the triage vital signs and the nursing notes.  Pertinent labs & imaging results that were available during my care of the patient were reviewed by me and considered in my medical decision making (see chart for details).     MDM: 1.  Cough, unspecified type-CXR results revealed above, Rx'd Zithromax (500 mg day 1 then 250 mg day 2-5, Rx'd prednisone 20 mg tablet: Take 3 tabs p.o. daily x 5 days, Rx'd Tessalon 200 mg capsule: Take 1 capsule 3 times daily, as needed; 2.  Allergic rhinitis, unspecified seasonality, unspecified trigger-Rx'd Allegra 180 mg fexofenadine daily x 5 days, then as needed. Advised patient to take medication as directed.  Advised may take Allegra daily for 5 days for concurrent postnasal drainage/drip.  Advised may  use Tessalon capsules daily or as needed for cough.  Encouraged to increase daily water intake to 64 ounces per day while taking these medications.  Advised we will follow-up with chest x-ray results once received.  Patient discharged home, hemodynamically stable. Final Clinical Impressions(s) / UC Diagnoses   Final diagnoses:  Cough, unspecified type  Allergic rhinitis, unspecified seasonality, unspecified trigger     Discharge Instructions      Advised patient to take medication as directed.  Advised may take Allegra daily for 5 days for concurrent postnasal drainage/drip.  Advised may use Tessalon capsules daily or as needed for cough.  Encouraged to increase daily water intake to 64 ounces per day while taking these medications.  Advised we will follow-up with chest x-ray results once received.     ED Prescriptions     Medication Sig Dispense Auth. Provider   fexofenadine (ALLEGRA ALLERGY) 180 MG tablet Take 1 tablet (180 mg total) by mouth daily 30 tablet Trevor Iha, FNP   benzonatate (TESSALON) 200 MG capsule Take 1 capsule (200 mg total) by mouth 3 (three) times daily as needed for up to 7 days. 40 capsule Trevor Iha, FNP      PDMP not reviewed this encounter.   Trevor Iha, FNP 02/10/23 1730

## 2023-02-10 NOTE — Discharge Instructions (Addendum)
Advised patient to take medication as directed.  Advised may take Allegra daily for 5 days for concurrent postnasal drainage/drip.  Advised may use Tessalon capsules daily or as needed for cough.  Encouraged to increase daily water intake to 64 ounces per day while taking these medications.  Advised we will follow-up with chest x-ray results once received.

## 2023-02-10 NOTE — ED Triage Notes (Addendum)
Pt c/o cough and congestion worsening in last three days. Was recently sick and tx with abx, steroids and cough medicine. Still continues to have bad productive cough. Denies fever. Hx of bronchitis and pneumonia.

## 2023-02-10 NOTE — Telephone Encounter (Signed)
Zithromax and prednisone sent to pharmacy.  Patient notified of CXR results.

## 2023-02-12 ENCOUNTER — Ambulatory Visit: Payer: Federal, State, Local not specified - PPO | Admitting: Sports Medicine

## 2023-02-20 ENCOUNTER — Ambulatory Visit: Payer: Federal, State, Local not specified - PPO | Admitting: Sports Medicine

## 2023-02-20 ENCOUNTER — Other Ambulatory Visit: Payer: Self-pay

## 2023-02-20 ENCOUNTER — Ambulatory Visit
Admission: EM | Admit: 2023-02-20 | Discharge: 2023-02-20 | Disposition: A | Discharge status: Home / Self Care | Payer: Federal, State, Local not specified - PPO | Attending: Family Medicine | Admitting: Family Medicine

## 2023-02-20 DIAGNOSIS — R059 Cough, unspecified: Secondary | ICD-10-CM | POA: Insufficient documentation

## 2023-02-20 DIAGNOSIS — R350 Frequency of micturition: Secondary | ICD-10-CM | POA: Insufficient documentation

## 2023-02-20 LAB — POCT URINALYSIS DIP (MANUAL ENTRY)
Bilirubin, UA: NEGATIVE
Glucose, UA: NEGATIVE mg/dL
Ketones, POC UA: NEGATIVE mg/dL
Nitrite, UA: NEGATIVE
Protein Ur, POC: NEGATIVE mg/dL
Spec Grav, UA: >=1.03 — AB (ref 1.010–1.025)
Urobilinogen, UA: 0.2 U/dL
pH, UA: 6 (ref 5.0–8.0)

## 2023-02-20 MED ORDER — CEPHALEXIN 500 MG PO CAPS: ORAL_CAPSULE | ORAL | 0 refills | Status: DC

## 2023-02-20 NOTE — ED Triage Notes (Signed)
C/o cough x 3 weeks and bil flank pain x 2 days. Unsure of fever.

## 2023-02-20 NOTE — ED Provider Notes (Signed)
[name]    CSN: [id number] Arrival date & time: 02/20/23  1809      History   Chief Complaint Chief Complaint  Patient presents with   Flank Pain    HPI Jennifer Erickson is a 52 y.o. female.   Patient was treated here ten days ago for cough and allergic rhinitis.  She reports that she still has some cough, but has now developed dark urine, frequency, urgency, and mild bilateral flank pain. She is not sure if she has had fever.  The history is provided by the patient.    Past Medical History:  Diagnosis Date   Lupus    Personal history of amenorrhea    Sinusitis     Patient Active Problem List   Diagnosis Date Noted   Sinobronchitis 10/21/2022   Sweating increase 10/14/2022   Leg cramps 10/14/2022   Chronic venous stasis 09/08/2022   Elevated hemoglobin A1c 09/05/2022   Biceps tendinitis, right, distal 07/18/2022   Bilateral hamstring strain 07/18/2022   Viral infection 11/01/2021   Radiculitis of right cervical region 10/11/2021   Laceration of scalp 04/23/2021   Pre-diabetes 09/17/2020   PND (post-nasal drip) 01/24/2020   Trochanteric bursitis, right hip 01/04/2020   Numbness and tingling in both hands 08/18/2019   Lumbar degenerative disc disease 06/06/2019   Asteatosis 03/23/2019   Effect of high altitude on sinuses 07/23/2018   Metabolic syndrome with hyperglycemia 07/23/2018   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   Alopecia, 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   Elevated blood pressure reading 09/24/2017   Cough 07/03/2017   Left shoulder pain 12/03/2016   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) 08/19/2011   Uvular swelling 08/19/2011    Past Surgical History:  Procedure Laterality Date   DILATION AND CURETTAGE OF UTERUS     nexplanon insertion     TONSILLECTOMY      OB History     Gravida  0   Para  0   Term  0   Preterm  0   AB  0   Living  0      SAB  0   IAB  0   Ectopic  0   Multiple  0   Live Births  0            Home Medications    Prior to Admission medications   Medication Sig Start Date End Date Taking? Authorizing Provider  cephALEXin (KEFLEX) 500 MG capsule Take one cap PO BID for one week 02/20/23  Yes Kymari Lollis, Tera Mater, MD  AMBULATORY NON FORMULARY MEDICATION Compression stocks to treat for chronic venous stasis. 09/05/22   Breeback, Jade L, PA-C  azithromycin (ZITHROMAX) 250 MG tablet Take 1 tablet (250 mg total) by mouth daily. Take first 2 tablets together, then 1 every day until finished. 02/10/23   Trevor Iha, FNP  benzonatate (TESSALON) 200 MG capsule Take 1 capsule (200 mg total) by mouth 3 (three) times daily as needed for up to 7 days. 02/10/23  02/26/23  Trevor Iha, FNP  Continuous Glucose Sensor (FREESTYLE LIBRE 14 DAY SENSOR) MISC APPLY TO UPPER DELTOID EVERY 14 DAYS. USE WITH READER TO DETERMINE BLOOD SUGARS 11/07/22   Breeback, Jade L, PA-C  etonogestrel (NEXPLANON) 68 MG IMPL implant 1 each by Subdermal route once. 12/25/21   [provider]  fexofenadine [facility] ALLERGY) 180 MG tablet Take 1 tablet (180 mg total) by mouth daily 02/10/23   Trevor Iha, FNP  hydroxychloroquine (PLAQUENIL) 200 MG tablet Take 1 tablet (200 mg total) by mouth 2 (two) times daily. With food or milk 08/22/22     Multiple Vitamins-Minerals (ZINC PO) Take by mouth as needed.    [provider]  predniSONE (DELTASONE) 20 MG tablet Take 3 tabs PO daily x 5 days. 02/10/23   Trevor Iha, FNP  VITAMIN  D PO Take by mouth.    [provider]    Family History Family History  Problem Relation Age of Onset   Cancer Mother        brain   Hypertension Father    Diabetes Brother     Social History Social History   Tobacco Use   Smoking status: Never   Smokeless tobacco: Never  Vaping Use   Vaping status: Never Used  Substance Use Topics   Alcohol use: No   Drug use: No     Allergies   Aspirin, Augmentin [amoxicillin-pot clavulanate], Ciprofloxacin, Hydrocodone, Ivp dye [iodinated contrast media], and Sulfa antibiotics   Review of Systems Review of Systems No sore throat + cough No pleuritic pain No wheezing No nasal congestion No post-nasal drainage No sinus pain/pressure No itchy/red eyes No earache No hemoptysis No SOB ? fever/No chills No nausea No vomiting No abdominal pain No diarrhea + urinary frequency and urgency + bilateral flank pain No skin rash No fatigue No myalgias No headache   Physical Exam Triage Vital Signs ED Triage Vitals  Encounter Vitals Group     BP 02/20/23 1828 (!) 160/94     Systolic BP Percentile --      Diastolic BP Percentile --      Pulse Rate 02/20/23 1828 95     Resp 02/20/23 1828 18     Temp 02/20/23 1828 98.4 F (36.9 C)     Temp Source 02/20/23 1828 Oral     SpO2 02/20/23 1828 99 %     Weight --      Height --      Head Circumference --      Peak Flow --      Pain Score 02/20/23 1832 8     Pain Loc --      Pain Education --      Exclude from Growth Chart --    No data found.  Updated Vital Signs BP (!) 160/94   Pulse 95   Temp 98.4 F (36.9 C) (Oral)   Resp 18   SpO2 99%   Visual Acuity Right Eye Distance:   Left Eye Distance:   Bilateral Distance:    Right Eye Near:   Left Eye Near:    Bilateral Near:     Physical Exam Nursing notes and Vital Signs reviewed. Appearance:  Patient appears stated age, and in no acute distress.    Eyes:  Pupils are equal, round, and reactive to  light and accomodation.  Extraocular movement is intact.  Conjunctivae are not inflamed   Pharynx:  Normal; moist mucous membranes  Neck:  Supple.  No adenopathy Lungs:  Clear to auscultation.  Breath sounds are equal.  Moving air well. Heart:  Regular rate and rhythm without murmurs, rubs, or gallops.  Abdomen:  Nontender without masses or hepatosplenomegaly.  Bowel sounds are present.  No CVA or flank tenderness.  Extremities:  No edema.  Skin:  No rash present.     UC Treatments / Results  Labs (all labs ordered are listed, but only abnormal results are displayed) Labs Reviewed  POCT URINALYSIS DIP (MANUAL ENTRY) - Abnormal; Notable for the following components:      Result Value   Spec Grav, UA >=1.030 (*)    Blood, UA trace-intact (*)    Leukocytes, UA Trace (*)    All other components within normal limits  URINE CULTURE    EKG   Radiology No results found.  Procedures Procedures (including critical care time)  Medications Ordered in UC Medications - No data to display  Initial Impression / Assessment and Plan / UC Course  I have reviewed the triage vital signs and the nursing notes.  Pertinent labs & imaging results that were available during my care of the patient were reviewed by me and considered in my medical decision making (see chart for details).    Urine culture pending. Begin Keflex 500mg  BID for one week. Patient states that she still has some mild cough but lung exam negative.  Note negative chest x-ray done 02/10/23. Followup with Family Doctor if not improved in one week.  Final Clinical Impressions(s) / UC Diagnoses   Final diagnoses:  Urinary frequency     Discharge Instructions      Increase fluid intake. Recommend switching to plain Mucinex 1200mg  twice daily for cough.  If symptoms become significantly worse during the night or over the weekend, proceed to the local emergency room.     ED Prescriptions     Medication Sig Dispense  Auth. Provider   cephALEXin (KEFLEX) 500 MG capsule Take one cap PO BID for one week 14 capsule Lattie Haw, MD         Lattie Haw, MD 02/21/23 1626

## 2023-02-20 NOTE — Discharge Instructions (Signed)
Increase fluid intake. Recommend switching to plain Mucinex 1200mg  twice daily for cough.  If symptoms become significantly worse during the night or over the weekend, proceed to the local emergency room.

## 2023-02-22 LAB — URINE CULTURE
Culture: <10000 — AB
Special Requests: NORMAL

## 2023-03-04 ENCOUNTER — Other Ambulatory Visit: Payer: Self-pay

## 2023-03-04 ENCOUNTER — Other Ambulatory Visit (HOSPITAL_BASED_OUTPATIENT_CLINIC_OR_DEPARTMENT_OTHER): Payer: Self-pay

## 2023-03-05 DIAGNOSIS — Z79899 Other long term (current) drug therapy: Secondary | ICD-10-CM | POA: Diagnosis not present

## 2023-03-09 ENCOUNTER — Ambulatory Visit: Payer: Federal, State, Local not specified - PPO | Admitting: Physician Assistant

## 2023-03-09 DIAGNOSIS — R7309 Other abnormal glucose: Secondary | ICD-10-CM

## 2023-03-09 DIAGNOSIS — E66813 Obesity, class 3: Secondary | ICD-10-CM

## 2023-03-17 ENCOUNTER — Ambulatory Visit: Payer: Federal, State, Local not specified - PPO | Admitting: Physician Assistant

## 2023-03-17 ENCOUNTER — Other Ambulatory Visit (HOSPITAL_BASED_OUTPATIENT_CLINIC_OR_DEPARTMENT_OTHER): Payer: Self-pay

## 2023-03-17 VITALS — BP 120/84 | HR 112 | Temp 97.5°F | Ht 66.0 in | Wt 269.1 lb

## 2023-03-17 DIAGNOSIS — J40 Bronchitis, not specified as acute or chronic: Secondary | ICD-10-CM | POA: Diagnosis not present

## 2023-03-17 DIAGNOSIS — R509 Fever, unspecified: Secondary | ICD-10-CM | POA: Diagnosis not present

## 2023-03-17 DIAGNOSIS — R519 Headache, unspecified: Secondary | ICD-10-CM

## 2023-03-17 DIAGNOSIS — J329 Chronic sinusitis, unspecified: Secondary | ICD-10-CM

## 2023-03-17 LAB — POC COVID19 BINAXNOW: SARS Coronavirus 2 Ag: NEGATIVE

## 2023-03-17 LAB — POCT INFLUENZA A/B
Influenza A, POC: NEGATIVE
Influenza B, POC: NEGATIVE

## 2023-03-17 MED ORDER — METHYLPREDNISOLONE 4 MG PO TBPK
ORAL_TABLET | ORAL | 0 refills | Status: DC
  Filled 2023-03-17: qty 21, 6d supply, fill #0

## 2023-03-17 MED ORDER — DEXAMETHASONE SODIUM PHOSPHATE 10 MG/ML IJ SOLN
10.0000 mg | Freq: Once | INTRAMUSCULAR | Status: AC
  Administered 2023-03-17: 10 mg via INTRAMUSCULAR

## 2023-03-17 MED ORDER — AZITHROMYCIN 250 MG PO TABS
ORAL_TABLET | ORAL | 0 refills | Status: DC
  Filled 2023-03-17: qty 6, 5d supply, fill #0

## 2023-03-17 MED ORDER — KETOROLAC TROMETHAMINE 60 MG/2ML IM SOLN
60.0000 mg | Freq: Once | INTRAMUSCULAR | Status: AC
  Administered 2023-03-17: 60 mg via INTRAMUSCULAR

## 2023-03-17 NOTE — Progress Notes (Unsigned)
   Acute Office Visit  Subjective:     Patient ID: Jennifer Erickson, female    DOB: 19-Jun-1970, 52 y.o.   MRN: [id number]  No chief complaint on file.   HPI Patient is in today for ***  Viral 9/16 cough syrup Bronchitis 10/29 doxy and prendisone Cough 11/12 zpak-normal cxr prednisone 12/17 cough sinus  ROS      Objective:    There were no vitals taken for this visit. {Vitals History (Optional):23777}  Physical Exam  No results found for any visits on 03/17/23.      Assessment & Plan:   Problem List Items Addressed This Visit   None   No orders of the defined types were placed in this encounter.   No follow-ups on file.  Tandy Gaw, PA-C

## 2023-03-17 NOTE — Patient Instructions (Signed)

## 2023-03-18 ENCOUNTER — Encounter: Payer: Self-pay | Admitting: Physician Assistant

## 2023-04-15 ENCOUNTER — Ambulatory Visit: Payer: Federal, State, Local not specified - PPO | Admitting: Sports Medicine

## 2023-04-20 ENCOUNTER — Other Ambulatory Visit (HOSPITAL_BASED_OUTPATIENT_CLINIC_OR_DEPARTMENT_OTHER): Payer: Self-pay

## 2023-04-20 ENCOUNTER — Ambulatory Visit: Payer: Federal, State, Local not specified - PPO | Admitting: Sports Medicine

## 2023-04-20 ENCOUNTER — Ambulatory Visit: Payer: Federal, State, Local not specified - PPO

## 2023-04-20 DIAGNOSIS — M7521 Bicipital tendinitis, right shoulder: Secondary | ICD-10-CM | POA: Diagnosis not present

## 2023-04-20 DIAGNOSIS — E66813 Obesity, class 3: Secondary | ICD-10-CM | POA: Diagnosis not present

## 2023-04-20 DIAGNOSIS — Z6841 Body Mass Index (BMI) 40.0 and over, adult: Secondary | ICD-10-CM | POA: Diagnosis not present

## 2023-04-20 DIAGNOSIS — M170 Bilateral primary osteoarthritis of knee: Secondary | ICD-10-CM

## 2023-04-20 DIAGNOSIS — M25562 Pain in left knee: Secondary | ICD-10-CM | POA: Diagnosis not present

## 2023-04-20 MED ORDER — ZEPBOUND 2.5 MG/0.5ML ~~LOC~~ SOAJ
2.5000 mg | SUBCUTANEOUS | 0 refills | Status: DC
  Filled 2023-04-20 – 2023-09-14 (×4): qty 2, 28d supply, fill #0

## 2023-04-20 NOTE — Assessment & Plan Note (Signed)
Manuel is also having increasing amount of pain right anterolateral elbow, we initially suspected more of a distal biceps tendinitis as she did have a positive Yergason test. MRI did show intense T2 edema around the distal biceps along its course to the radial tuberosity, no obvious tearing, we did an ultrasound-guided injection into the distal biceps sheath from a posterior approach, this was done back in July, she did relatively well but now is having a recurrence. In the spirit of avoiding additional interventional treatment we will have her do some physical therapy, if not better after about 6 weeks we will consider repeat ultrasound-guided injection.

## 2023-04-20 NOTE — Assessment & Plan Note (Signed)
Morbid obesity with multiple comorbidities including degenerative disc disease, osteoarthritis, obstructive sleep apnea, high blood pressure. She will be enrolled in a multidisciplinary weight loss program with calorie counting and an exercise prescription, we will also add Zepbound. Return to see me after 1 month on Zepbound, we can consider compounded medications if these are not covered.

## 2023-04-20 NOTE — Assessment & Plan Note (Signed)
Pleasant 53 year old female, known bilateral knee osteoarthritis, she has had multiple treatment including oral analgesics injections, we did get her a custom knee brace with [employer], she loves the brace, it works really really well. She is having some increasing pain as the weather has gotten colder, she will continue analgesics, she will wear her brace a bit more often and I am going to add some physical therapy before we consider additional interventional treatment. Return in 6 weeks for this.

## 2023-04-20 NOTE — Progress Notes (Signed)
    Procedures performed today:    None.  Independent interpretation of notes and tests performed by another provider:   None.  Brief History, Exam, Impression, and Recommendations:    Primary osteoarthritis of both knees Pleasant 53 year old female, known bilateral knee osteoarthritis, she has had multiple treatment including oral analgesics injections, we did get her a custom knee brace with [employer], she loves the brace, it works really really well. She is having some increasing pain as the weather has gotten colder, she will continue analgesics, she will wear her brace a bit more often and I am going to add some physical therapy before we consider additional interventional treatment. Return in 6 weeks for this.  Biceps tendinitis, right, distal [employer] is also having increasing amount of pain right anterolateral elbow, we initially suspected more of a distal biceps tendinitis as she did have a positive Yergason test. MRI did show intense T2 edema around the distal biceps along its course to the radial tuberosity, no obvious tearing, we did an ultrasound-guided injection into the distal biceps sheath from a posterior approach, this was done back in July, she did relatively well but now is having a recurrence. In the spirit of avoiding additional interventional treatment we will have her do some physical therapy, if not better after about 6 weeks we will consider repeat ultrasound-guided injection.  Class 3 severe obesity due to excess calories with serious comorbidity and body mass index (BMI) of 45.0 to 49.9 in adult [facility]) Morbid obesity with multiple comorbidities including degenerative disc disease, osteoarthritis, obstructive sleep apnea, high blood pressure. She will be enrolled in a multidisciplinary weight loss program with calorie counting and an exercise prescription, we will also add Zepbound. Return to see me after 1 month on Zepbound, we can consider compounded medications if  these are not covered.    ____________________________________________ Ihor Austin. Benjamin Stain, M.D., ABFM., CAQSM., AME. Primary Care and Sports Medicine [facility]  Adjunct Professor of Family Medicine  [employer]  Restaurant manager, fast food

## 2023-04-21 ENCOUNTER — Other Ambulatory Visit (HOSPITAL_BASED_OUTPATIENT_CLINIC_OR_DEPARTMENT_OTHER): Payer: Self-pay

## 2023-04-22 ENCOUNTER — Other Ambulatory Visit (HOSPITAL_BASED_OUTPATIENT_CLINIC_OR_DEPARTMENT_OTHER): Payer: Self-pay

## 2023-04-23 ENCOUNTER — Other Ambulatory Visit (HOSPITAL_BASED_OUTPATIENT_CLINIC_OR_DEPARTMENT_OTHER): Payer: Self-pay

## 2023-04-25 ENCOUNTER — Encounter: Payer: Self-pay | Admitting: Sports Medicine

## 2023-04-27 ENCOUNTER — Encounter: Payer: Self-pay | Admitting: Sports Medicine

## 2023-04-27 ENCOUNTER — Other Ambulatory Visit (HOSPITAL_BASED_OUTPATIENT_CLINIC_OR_DEPARTMENT_OTHER): Payer: Self-pay

## 2023-04-27 ENCOUNTER — Ambulatory Visit: Payer: Federal, State, Local not specified - PPO | Attending: Sports Medicine | Admitting: Physical Therapy

## 2023-04-27 NOTE — Therapy (Incomplete)
OUTPATIENT PHYSICAL THERAPY EVALUATION   Patient Name: Jennifer Erickson MRN: [id number] DOB:11-10-1970, 53 y.o., female Today's Date: 04/27/2023  END OF SESSION:   Past Medical History:  Diagnosis Date   Lupus    Personal history of amenorrhea    Sinusitis    Past Surgical History:  Procedure Laterality Date   DILATION AND CURETTAGE OF UTERUS     nexplanon insertion     TONSILLECTOMY     Patient Active Problem List   Diagnosis Date Noted   Sinobronchitis 10/21/2022   Sweating increase 10/14/2022   Leg cramps 10/14/2022   Chronic venous stasis 09/08/2022   Elevated hemoglobin A1c 09/05/2022   Biceps tendinitis, right, distal 07/18/2022   Bilateral hamstring strain 07/18/2022   Viral infection 11/01/2021   Radiculitis of right cervical region 10/11/2021   Laceration of scalp 04/23/2021   Pre-diabetes 09/17/2020   PND (post-nasal drip) 01/24/2020   Trochanteric bursitis, right hip 01/04/2020   Numbness and tingling in both hands 08/18/2019   Lumbar degenerative disc disease 06/06/2019   Asteatosis 03/23/2019   Effect of high altitude on sinuses 07/23/2018   Metabolic syndrome with hyperglycemia 07/23/2018   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   Alopecia, 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   Elevated blood pressure reading 09/24/2017   Cough 07/03/2017   Left shoulder pain 12/03/2016   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   Palpitations 07/11/2015   OSA on CPAP 07/10/2015   Large breasts 05/28/2015   Left tennis elbow 05/28/2015   Primary osteoarthritis of both knees 12/08/2014   Vitamin D deficiency 04/19/2014   Bilateral swelling of feet 05/02/2013   Left breast mass 11/22/2012   Plantar fasciitis, bilateral 11/01/2012   Class 3 severe obesity due to excess calories with serious comorbidity and body mass index (BMI) of 45.0 to 49.9 in adult [facility]) 08/19/2011   Uvular swelling 08/19/2011    PCP: Jomarie Longs,  PA-C  REFERRING PROVIDER: Monica Becton, MD  REFERRING DIAG: M17.0 (ICD-10-CM) - Primary osteoarthritis of both knees Note: bilateral knee osteoarthritis, right distal biceps tendinosis  THERAPY DIAG:  No diagnosis found.  Rationale for Evaluation and Treatment: Rehabilitation  ONSET DATE: ***  SUBJECTIVE:   SUBJECTIVE STATEMENT: ***  PERTINENT HISTORY: lupus, hx PVCs, OSA on CPAP, pre diabetes, chronic venous stasis  PAIN:  Are you having pain: *** Location/description: *** Best-worst over past week: ***  - aggravating factors: *** - Easing factors: ***    PRECAUTIONS: {Therapy precautions:24002}   WEIGHT BEARING RESTRICTIONS: {Yes ***/No:24003}  FALLS:  Has patient fallen in last 6 months? {fallsyesno:27318}  LIVING ENVIRONMENT: Lives with: {[facility] lives with:25569::"lives with their family"} Lives in: {Lives in:25570} Stairs: {opstairs:27293} Has following equipment at home: {Assistive devices:23999}  OCCUPATION: ***  PLOF: {PLOF:24004}  PATIENT GOALS: ***  NEXT MD VISIT: ***  OBJECTIVE:  Note: Objective measures were completed at Evaluation unless otherwise noted.  DIAGNOSTIC FINDINGS:  04/25/23 BIL knee XR "IMPRESSION: Bilateral osteoarthritis which appears somewhat worse on the right. There has been progression of degenerative change about the right knee, particularly in the patellofemoral compartment.   No acute finding."  PATIENT SURVEYS:  FOTO ***  COGNITION: Overall cognitive status: Within functional limits for tasks assessed     SENSATION: {sensation:27233}  EDEMA:  {edema:24020}  MUSCLE LENGTH: Hamstrings: Right *** deg; Left *** deg Maisie Fus test: Right *** deg; Left *** deg  POSTURE: {posture:25561}  PALPATION: ***  LOWER EXTREMITY ROM:     {AROM/PROM:27142}  Right eval  Left eval  Hip flexion    Hip extension    Hip internal rotation    Hip external rotation    Knee extension    Knee flexion    (Blank  rows = not tested) (Key: WFL = within functional limits not formally assessed, * = concordant pain, s = stiffness/stretching sensation, NT = not tested)  Comments:    LOWER EXTREMITY MMT:    MMT Right eval Left eval  Hip flexion    Hip abduction (modified sitting)    Hip internal rotation    Hip external rotation    Knee flexion    Knee extension    Ankle dorsiflexion     (Blank rows = not tested) (Key: WFL = within functional limits not formally assessed, * = concordant pain, s = stiffness/stretching sensation, NT = not tested)  Comments:    LOWER EXTREMITY SPECIAL TESTS:  {LEspecialtests:26242}  FUNCTIONAL TESTS:  {Functional tests:24029}  GAIT: Distance walked: *** Assistive device utilized: {Assistive devices:23999} Level of assistance: {Levels of assistance:24026} Comments: ***                                                                                                                                TREATMENT DATE:  [facility] Adult PT Treatment:                                                DATE: 04/27/23 Therapeutic Exercise: *** Manual Therapy: *** Neuromuscular re-ed: *** Therapeutic Activity: *** Modalities: *** Self Care: ***     PATIENT EDUCATION:  Education details: Pt education on PT impairments, prognosis, and POC. Informed consent. Rationale for interventions, safe/appropriate HEP performance Person educated: Patient Education method: Explanation, Demonstration, Tactile cues, Verbal cues Education comprehension: verbalized understanding, returned demonstration, verbal cues required, tactile cues required, and needs further education    HOME EXERCISE PROGRAM: ***  ASSESSMENT:  CLINICAL IMPRESSION: Patient is a 53 y.o. woman who was seen today for physical therapy evaluation and treatment for BIL knee pain and R elbow pain. ***    OBJECTIVE IMPAIRMENTS: {opptimpairments:25111}.   ACTIVITY LIMITATIONS:  {activitylimitations:27494}  PARTICIPATION LIMITATIONS: {participationrestrictions:25113}  PERSONAL FACTORS: Time since onset of injury/illness/exacerbation and 3+ comorbidities: lupus, hx PVCs, OSA on CPAP, pre diabetes, chronic venous stasis  are also affecting patient's functional outcome.   REHAB POTENTIAL: {rehabpotential:25112}  CLINICAL DECISION MAKING: {clinical decision making:25114}  EVALUATION COMPLEXITY: {Evaluation complexity:25115}   GOALS: Goals reviewed with patient? {yes/no:20286}  SHORT TERM GOALS: Target date: ***  Pt will demonstrate appropriate understanding and performance of initially prescribed HEP in order to facilitate improved independence with management of symptoms.  Baseline: HEP provided on eval Goal status: INITIAL   2. Pt will score greater than or equal to *** on FOTO in order to demonstrate improved perception of function due  to symptoms.  Baseline: ***  Goal status: INITIAL    LONG TERM GOALS: Target date: ***  Pt will score *** or greater on FOTO in order to demonstrate improved perception of function due to symptoms.  Baseline: *** Goal status: INITIAL  2.  Pt will demonstrate at least *** degrees of *** AROM in order to facilitate improved tolerance to functional movements such as ***.  Baseline: see ROM chart above Goal status: INITIAL  3.  Pt will be able to lift up to *** in order to demonstrate improved capacity for daily activities such as ***.  Baseline: *** Goal status: INITIAL  4.  Pt will be able to perform 5xSTS in less than or equal to *** in order to demonstrate reduced fall risk and improved functional independence (MCID 5xSTS = 2.3 sec). Baseline: *** Goal status: INITIAL    PLAN:  PT FREQUENCY: {rehab frequency:25116}  PT DURATION: {rehab duration:25117}  PLANNED INTERVENTIONS: {rehab planned interventions:25118::"97110-Therapeutic exercises","97530- Therapeutic [phone]- Neuromuscular  re-education","97535- Self JXBJ","47829- Manual therapy"}  PLAN FOR NEXT SESSION: Review/update HEP PRN. Work on [employer] exercises as appropriate with emphasis on ***. Symptom modification strategies as indicated/appropriate.    Ashley Murrain PT, DPT 04/27/2023 7:10 AM

## 2023-04-28 ENCOUNTER — Other Ambulatory Visit (HOSPITAL_BASED_OUTPATIENT_CLINIC_OR_DEPARTMENT_OTHER): Payer: Self-pay

## 2023-04-29 ENCOUNTER — Other Ambulatory Visit (HOSPITAL_BASED_OUTPATIENT_CLINIC_OR_DEPARTMENT_OTHER): Payer: Self-pay

## 2023-04-30 ENCOUNTER — Other Ambulatory Visit (HOSPITAL_BASED_OUTPATIENT_CLINIC_OR_DEPARTMENT_OTHER): Payer: Self-pay

## 2023-05-01 ENCOUNTER — Other Ambulatory Visit (HOSPITAL_BASED_OUTPATIENT_CLINIC_OR_DEPARTMENT_OTHER): Payer: Self-pay

## 2023-05-04 ENCOUNTER — Other Ambulatory Visit (HOSPITAL_BASED_OUTPATIENT_CLINIC_OR_DEPARTMENT_OTHER): Payer: Self-pay

## 2023-05-06 ENCOUNTER — Other Ambulatory Visit (HOSPITAL_BASED_OUTPATIENT_CLINIC_OR_DEPARTMENT_OTHER): Payer: Self-pay

## 2023-05-07 ENCOUNTER — Other Ambulatory Visit (HOSPITAL_BASED_OUTPATIENT_CLINIC_OR_DEPARTMENT_OTHER): Payer: Self-pay

## 2023-05-08 ENCOUNTER — Other Ambulatory Visit (HOSPITAL_BASED_OUTPATIENT_CLINIC_OR_DEPARTMENT_OTHER): Payer: Self-pay

## 2023-05-11 ENCOUNTER — Other Ambulatory Visit (HOSPITAL_BASED_OUTPATIENT_CLINIC_OR_DEPARTMENT_OTHER): Payer: Self-pay

## 2023-05-12 ENCOUNTER — Other Ambulatory Visit (HOSPITAL_BASED_OUTPATIENT_CLINIC_OR_DEPARTMENT_OTHER): Payer: Self-pay

## 2023-05-13 ENCOUNTER — Other Ambulatory Visit (HOSPITAL_BASED_OUTPATIENT_CLINIC_OR_DEPARTMENT_OTHER): Payer: Self-pay

## 2023-05-14 ENCOUNTER — Other Ambulatory Visit (HOSPITAL_BASED_OUTPATIENT_CLINIC_OR_DEPARTMENT_OTHER): Payer: Self-pay

## 2023-05-15 ENCOUNTER — Other Ambulatory Visit (HOSPITAL_BASED_OUTPATIENT_CLINIC_OR_DEPARTMENT_OTHER): Payer: Self-pay

## 2023-05-18 ENCOUNTER — Other Ambulatory Visit (HOSPITAL_BASED_OUTPATIENT_CLINIC_OR_DEPARTMENT_OTHER): Payer: Self-pay

## 2023-05-19 ENCOUNTER — Other Ambulatory Visit (HOSPITAL_BASED_OUTPATIENT_CLINIC_OR_DEPARTMENT_OTHER): Payer: Self-pay

## 2023-05-20 ENCOUNTER — Other Ambulatory Visit (HOSPITAL_BASED_OUTPATIENT_CLINIC_OR_DEPARTMENT_OTHER): Payer: Self-pay

## 2023-05-27 ENCOUNTER — Other Ambulatory Visit: Payer: Self-pay

## 2023-05-27 ENCOUNTER — Other Ambulatory Visit (HOSPITAL_BASED_OUTPATIENT_CLINIC_OR_DEPARTMENT_OTHER): Payer: Self-pay

## 2023-05-28 ENCOUNTER — Other Ambulatory Visit (HOSPITAL_BASED_OUTPATIENT_CLINIC_OR_DEPARTMENT_OTHER): Payer: Self-pay

## 2023-05-29 ENCOUNTER — Other Ambulatory Visit (HOSPITAL_BASED_OUTPATIENT_CLINIC_OR_DEPARTMENT_OTHER): Payer: Self-pay

## 2023-06-01 ENCOUNTER — Ambulatory Visit: Payer: Federal, State, Local not specified - PPO | Admitting: Sports Medicine

## 2023-06-02 ENCOUNTER — Ambulatory Visit: Payer: Federal, State, Local not specified - PPO | Admitting: Sports Medicine

## 2023-06-02 ENCOUNTER — Other Ambulatory Visit (HOSPITAL_BASED_OUTPATIENT_CLINIC_OR_DEPARTMENT_OTHER): Payer: Self-pay

## 2023-06-03 ENCOUNTER — Other Ambulatory Visit (HOSPITAL_BASED_OUTPATIENT_CLINIC_OR_DEPARTMENT_OTHER): Payer: Self-pay

## 2023-06-04 ENCOUNTER — Other Ambulatory Visit (HOSPITAL_BASED_OUTPATIENT_CLINIC_OR_DEPARTMENT_OTHER): Payer: Self-pay

## 2023-06-05 ENCOUNTER — Other Ambulatory Visit (HOSPITAL_BASED_OUTPATIENT_CLINIC_OR_DEPARTMENT_OTHER): Payer: Self-pay

## 2023-06-08 ENCOUNTER — Other Ambulatory Visit (HOSPITAL_BASED_OUTPATIENT_CLINIC_OR_DEPARTMENT_OTHER): Payer: Self-pay

## 2023-06-09 ENCOUNTER — Other Ambulatory Visit (HOSPITAL_BASED_OUTPATIENT_CLINIC_OR_DEPARTMENT_OTHER): Payer: Self-pay

## 2023-06-10 ENCOUNTER — Other Ambulatory Visit (HOSPITAL_BASED_OUTPATIENT_CLINIC_OR_DEPARTMENT_OTHER): Payer: Self-pay

## 2023-06-11 ENCOUNTER — Other Ambulatory Visit (HOSPITAL_BASED_OUTPATIENT_CLINIC_OR_DEPARTMENT_OTHER): Payer: Self-pay

## 2023-06-12 ENCOUNTER — Other Ambulatory Visit (HOSPITAL_BASED_OUTPATIENT_CLINIC_OR_DEPARTMENT_OTHER): Payer: Self-pay

## 2023-06-15 ENCOUNTER — Other Ambulatory Visit (HOSPITAL_BASED_OUTPATIENT_CLINIC_OR_DEPARTMENT_OTHER): Payer: Self-pay

## 2023-06-16 ENCOUNTER — Other Ambulatory Visit (HOSPITAL_BASED_OUTPATIENT_CLINIC_OR_DEPARTMENT_OTHER): Payer: Self-pay

## 2023-06-16 ENCOUNTER — Ambulatory Visit
Admission: EM | Admit: 2023-06-16 | Discharge: 2023-06-16 | Disposition: A | Discharge status: Home / Self Care | Attending: Family Medicine | Admitting: Family Medicine

## 2023-06-16 DIAGNOSIS — J069 Acute upper respiratory infection, unspecified: Secondary | ICD-10-CM

## 2023-06-16 DIAGNOSIS — R059 Cough, unspecified: Secondary | ICD-10-CM | POA: Diagnosis not present

## 2023-06-16 MED ORDER — PREDNISONE 10 MG (21) PO TBPK
ORAL_TABLET | Freq: Every day | ORAL | 0 refills | Status: AC
Start: 2023-06-16 — End: ?

## 2023-06-16 MED ORDER — DOXYCYCLINE HYCLATE 100 MG PO CAPS: 100.0000 mg | ORAL_CAPSULE | Freq: Two times a day (BID) | ORAL | 0 refills | Status: AC

## 2023-06-16 NOTE — ED Triage Notes (Signed)
 Pt c/o continued cough and SOB since having a cold 3 weeks ago. Hx of asthma. Using inhaler prn, as well as homeopathic Sinus Calm and Oscillococcinum.

## 2023-06-16 NOTE — ED Provider Notes (Signed)
 [name]    CSN: [id number] Arrival date & time: 06/16/23  1744      History   Chief Complaint Chief Complaint  Patient presents with   Shortness of Breath   Cough    HPI Jennifer Erickson is a 53 y.o. female.   HPI 53 year old female presents with shortness of breath and cough for 3 weeks.  PMH significant for lupus, chronic venous stasis, and OSA.  Past Medical History:  Diagnosis Date   Lupus    Personal history of amenorrhea    Sinusitis     Patient Active Problem List   Diagnosis Date Noted   Sinobronchitis 10/21/2022   Sweating increase 10/14/2022   Leg cramps 10/14/2022   Chronic venous stasis 09/08/2022   Elevated hemoglobin A1c 09/05/2022   Biceps tendinitis, right, distal 07/18/2022   Bilateral hamstring strain 07/18/2022   Viral infection 11/01/2021   Radiculitis of right cervical region 10/11/2021   Laceration of scalp 04/23/2021   Pre-diabetes 09/17/2020   PND (post-nasal drip) 01/24/2020   Trochanteric bursitis, right hip 01/04/2020   Numbness and tingling in both hands 08/18/2019   Lumbar degenerative disc disease 06/06/2019   Asteatosis 03/23/2019   Effect of high altitude on sinuses 07/23/2018   Metabolic syndrome with hyperglycemia 07/23/2018   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   Alopecia, 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   Elevated blood pressure reading 09/24/2017   Cough 07/03/2017   Left shoulder pain 12/03/2016   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   Palpitations 07/11/2015   OSA on CPAP 07/10/2015   Large breasts 05/28/2015   Left tennis elbow 05/28/2015   Primary osteoarthritis of both knees 12/08/2014   Vitamin D deficiency 04/19/2014   Bilateral swelling of feet 05/02/2013   Left breast mass 11/22/2012   Plantar fasciitis, bilateral 11/01/2012   Class 3 severe obesity due to excess calories with serious comorbidity and body mass index (BMI) of 45.0 to 49.9 in adult [facility])  08/19/2011   Uvular swelling 08/19/2011    Past Surgical History:  Procedure Laterality Date   DILATION AND CURETTAGE OF UTERUS     nexplanon insertion     TONSILLECTOMY      OB History     Gravida  0   Para  0   Term  0   Preterm  0   AB  0   Living  0      SAB  0   IAB  0   Ectopic  0   Multiple  0   Live Births  0            Home Medications    Prior to Admission medications   Medication Sig Start Date End Date Taking? Authorizing Provider  doxycycline (VIBRAMYCIN) 100 MG capsule Take 1 capsule (100 mg total) by mouth 2 (two) times daily for 7 days. 06/16/23 06/23/23 Yes Trevor Iha, FNP  predniSONE (STERAPRED UNI-PAK 21 TAB) 10 MG (21) TBPK tablet Take by mouth daily. Take 6 tabs by mouth daily  for 2 days, then 5 tabs for 2 days, then 4 tabs for 2 days, then 3 tabs for 2 days, 2 tabs for 2 days, then 1 tab by mouth daily for 2 days 06/16/23  Yes Trevor Iha, FNP  AMBULATORY NON FORMULARY MEDICATION Compression stocks to treat for chronic venous stasis. 09/05/22   Breeback, Jade L, PA-C  Continuous Glucose Sensor (FREESTYLE LIBRE 14 DAY SENSOR) MISC APPLY TO UPPER DELTOID EVERY  14 DAYS. USE WITH READER TO DETERMINE BLOOD SUGARS 11/07/22   Breeback, Jade L, PA-C  etonogestrel (NEXPLANON) 68 MG IMPL implant 1 each by Subdermal route once. 12/25/21   [provider]  hydroxychloroquine (PLAQUENIL) 200 MG tablet Take 1 tablet (200 mg total) by mouth 2 (two) times daily. With food or milk 08/22/22     Multiple Vitamins-Minerals (ZINC PO) Take by mouth as needed.    [provider]  tirzepatide (ZEPBOUND) 2.5 MG/0.5ML Pen Inject 2.5 mg into the skin once a week. 04/20/23   Monica Becton, MD  VITAMIN D PO Take by mouth.    [provider]    Family History Family History  Problem Relation Age of Onset   Cancer Mother        brain   Hypertension Father    Diabetes Brother     Social History Social History   Tobacco Use    Smoking status: Never   Smokeless tobacco: Never  Vaping Use   Vaping status: Never Used  Substance Use Topics   Alcohol use: No   Drug use: No     Allergies   Aspirin, Augmentin [amoxicillin-pot clavulanate], Ciprofloxacin, Hydrocodone, Ivp dye [iodinated contrast media], and Sulfa antibiotics   Review of Systems Review of Systems  Respiratory:  Positive for cough and shortness of breath.        Chest congestion x 3 weeks  All other systems reviewed and are negative.    Physical Exam Triage Vital Signs ED Triage Vitals [06/16/23 1811]  Encounter Vitals Group     BP      Systolic BP Percentile      Diastolic BP Percentile      Pulse      Resp      Temp      Temp src      SpO2      Weight      Height      Head Circumference      Peak Flow      Pain Score 0     Pain Loc      Pain Education      Exclude from Growth Chart    No data found.  Updated Vital Signs BP (!) 150/97 (BP Location: Right Arm)   Pulse 79   Temp 98.2 F (36.8 C) (Oral)   Resp 17   SpO2 97%    Physical Exam Vitals and nursing note reviewed.  Constitutional:      Appearance: Normal appearance. She is obese.  HENT:     Head: Normocephalic and atraumatic.     Right Ear: Tympanic membrane, ear canal and external ear normal.     Left Ear: Tympanic membrane, ear canal and external ear normal.     Mouth/Throat:     Mouth: Mucous membranes are moist.     Pharynx: Oropharynx is clear.  Eyes:     Extraocular Movements: Extraocular movements intact.     Conjunctiva/sclera: Conjunctivae normal.     Pupils: Pupils are equal, round, and reactive to light.  Cardiovascular:     Rate and Rhythm: Normal rate and regular rhythm.     Pulses: Normal pulses.     Heart sounds: Normal heart sounds.  Pulmonary:     Effort: Pulmonary effort is normal.     Breath sounds: Normal breath sounds. No wheezing, rhonchi or rales.  Musculoskeletal:        General: Normal range of motion.  Cervical  back: Normal range of motion and neck supple.  Skin:    General: Skin is warm and dry.  Neurological:     General: No focal deficit present.     Mental Status: She is alert and oriented to person, place, and time. Mental status is at baseline.  Psychiatric:        Mood and Affect: Mood normal.        Behavior: Behavior normal.      UC Treatments / Results  Labs (all labs ordered are listed, but only abnormal results are displayed) Labs Reviewed - No data to display  EKG   Radiology No results found.  Procedures Procedures (including critical care time)  Medications Ordered in UC Medications - No data to display  Initial Impression / Assessment and Plan / UC Course  I have reviewed the triage vital signs and the nursing notes.  Pertinent labs & imaging results that were available during my care of the patient were reviewed by me and considered in my medical decision making (see chart for details).     MDM: 1.  Acute URI-Rx doxycycline 100 mg capsule: Take 1 capsule twice daily x 7 days; 2.  Cough, unspecified type-Rx'd Sterapred Unipak (tapering from 60 mg to 10 mg over 10 days). Advised patient to take medications as directed with food to completion.  Advised patient to take prednisone with first dose of doxycycline to completion.  Encouraged to increase daily water intake to 64 ounces per day while taking these medications.  Advised if symptoms worsen and/or unresolved please follow-up with your PCP or here for further evaluation.  Patient discharged home, hemodynamically stable.  Work note provided to patient prior to discharge. Final Clinical Impressions(s) / UC Diagnoses   Final diagnoses:  Acute URI  Cough, unspecified type     Discharge Instructions      Advised patient to take medications as directed with food to completion.  Advised patient to take prednisone with first dose of doxycycline to completion.  Encouraged to increase daily water intake to 64 ounces  per day while taking these medications.  Advised if symptoms worsen and/or unresolved please follow-up with your PCP or here for further evaluation.     ED Prescriptions     Medication Sig Dispense Auth. Provider   doxycycline (VIBRAMYCIN) 100 MG capsule Take 1 capsule (100 mg total) by mouth 2 (two) times daily for 7 days. 14 capsule Trevor Iha, FNP   predniSONE (STERAPRED UNI-PAK 21 TAB) 10 MG (21) TBPK tablet Take by mouth daily. Take 6 tabs by mouth daily  for 2 days, then 5 tabs for 2 days, then 4 tabs for 2 days, then 3 tabs for 2 days, 2 tabs for 2 days, then 1 tab by mouth daily for 2 days 42 tablet Trevor Iha, FNP      PDMP not reviewed this encounter.   Trevor Iha, FNP 06/16/23 [phone]

## 2023-06-16 NOTE — Discharge Instructions (Addendum)
 Advised patient to take medications as directed with food to completion.  Advised patient to take prednisone with first dose of doxycycline to completion.  Encouraged to increase daily water intake to 64 ounces per day while taking these medications.  Advised if symptoms worsen and/or unresolved please follow-up with your PCP or here for further evaluation.

## 2023-06-17 ENCOUNTER — Ambulatory Visit: Admitting: Physician Assistant

## 2023-06-17 ENCOUNTER — Other Ambulatory Visit (HOSPITAL_BASED_OUTPATIENT_CLINIC_OR_DEPARTMENT_OTHER): Payer: Self-pay

## 2023-06-18 ENCOUNTER — Other Ambulatory Visit (HOSPITAL_BASED_OUTPATIENT_CLINIC_OR_DEPARTMENT_OTHER): Payer: Self-pay

## 2023-06-22 ENCOUNTER — Other Ambulatory Visit (HOSPITAL_BASED_OUTPATIENT_CLINIC_OR_DEPARTMENT_OTHER): Payer: Self-pay

## 2023-06-23 ENCOUNTER — Other Ambulatory Visit (HOSPITAL_BASED_OUTPATIENT_CLINIC_OR_DEPARTMENT_OTHER): Payer: Self-pay

## 2023-06-25 ENCOUNTER — Other Ambulatory Visit (HOSPITAL_BASED_OUTPATIENT_CLINIC_OR_DEPARTMENT_OTHER): Payer: Self-pay

## 2023-06-30 ENCOUNTER — Telehealth: Payer: Self-pay

## 2023-06-30 ENCOUNTER — Other Ambulatory Visit (HOSPITAL_COMMUNITY): Payer: Self-pay

## 2023-06-30 NOTE — Telephone Encounter (Signed)
 Pharmacy Patient Advocate Encounter   Received notification from Physician's Office that prior authorization for Zepbound 2.5MG /0.5ML is required/requested.   Insurance verification completed.   The patient is insured through [facility] .   Per test claim: PA required; PA submitted to above mentioned insurance via CoverMyMeds Key/confirmation #/EOC [id number] Status is pending

## 2023-06-30 NOTE — Telephone Encounter (Signed)
 Pharmacy Patient Advocate Encounter  Received notification from [employer] that Prior Authorization for Zepbound 2.5 has been DENIED.  Full denial letter will be uploaded to the media tab. See denial reason below.   PA #/Case ID/Reference #: [id number]

## 2023-07-01 ENCOUNTER — Other Ambulatory Visit (HOSPITAL_COMMUNITY): Payer: Self-pay

## 2023-07-01 ENCOUNTER — Other Ambulatory Visit (HOSPITAL_BASED_OUTPATIENT_CLINIC_OR_DEPARTMENT_OTHER): Payer: Self-pay

## 2023-07-02 ENCOUNTER — Other Ambulatory Visit (HOSPITAL_BASED_OUTPATIENT_CLINIC_OR_DEPARTMENT_OTHER): Payer: Self-pay

## 2023-07-03 ENCOUNTER — Other Ambulatory Visit (HOSPITAL_BASED_OUTPATIENT_CLINIC_OR_DEPARTMENT_OTHER): Payer: Self-pay

## 2023-07-06 ENCOUNTER — Other Ambulatory Visit (HOSPITAL_BASED_OUTPATIENT_CLINIC_OR_DEPARTMENT_OTHER): Payer: Self-pay

## 2023-07-07 ENCOUNTER — Other Ambulatory Visit (HOSPITAL_BASED_OUTPATIENT_CLINIC_OR_DEPARTMENT_OTHER): Payer: Self-pay

## 2023-07-08 ENCOUNTER — Other Ambulatory Visit (HOSPITAL_BASED_OUTPATIENT_CLINIC_OR_DEPARTMENT_OTHER): Payer: Self-pay

## 2023-07-09 ENCOUNTER — Other Ambulatory Visit (HOSPITAL_BASED_OUTPATIENT_CLINIC_OR_DEPARTMENT_OTHER): Payer: Self-pay

## 2023-07-10 ENCOUNTER — Other Ambulatory Visit (HOSPITAL_BASED_OUTPATIENT_CLINIC_OR_DEPARTMENT_OTHER): Payer: Self-pay

## 2023-07-13 ENCOUNTER — Other Ambulatory Visit (HOSPITAL_BASED_OUTPATIENT_CLINIC_OR_DEPARTMENT_OTHER): Payer: Self-pay

## 2023-07-14 ENCOUNTER — Other Ambulatory Visit (HOSPITAL_BASED_OUTPATIENT_CLINIC_OR_DEPARTMENT_OTHER): Payer: Self-pay

## 2023-07-15 ENCOUNTER — Other Ambulatory Visit (HOSPITAL_BASED_OUTPATIENT_CLINIC_OR_DEPARTMENT_OTHER): Payer: Self-pay

## 2023-07-16 ENCOUNTER — Other Ambulatory Visit (HOSPITAL_BASED_OUTPATIENT_CLINIC_OR_DEPARTMENT_OTHER): Payer: Self-pay

## 2023-07-17 ENCOUNTER — Other Ambulatory Visit (HOSPITAL_BASED_OUTPATIENT_CLINIC_OR_DEPARTMENT_OTHER): Payer: Self-pay

## 2023-07-20 ENCOUNTER — Other Ambulatory Visit (HOSPITAL_BASED_OUTPATIENT_CLINIC_OR_DEPARTMENT_OTHER): Payer: Self-pay

## 2023-07-21 ENCOUNTER — Other Ambulatory Visit (HOSPITAL_BASED_OUTPATIENT_CLINIC_OR_DEPARTMENT_OTHER): Payer: Self-pay

## 2023-07-23 ENCOUNTER — Other Ambulatory Visit (HOSPITAL_BASED_OUTPATIENT_CLINIC_OR_DEPARTMENT_OTHER): Payer: Self-pay

## 2023-07-24 ENCOUNTER — Other Ambulatory Visit (HOSPITAL_BASED_OUTPATIENT_CLINIC_OR_DEPARTMENT_OTHER): Payer: Self-pay

## 2023-07-27 ENCOUNTER — Other Ambulatory Visit (HOSPITAL_BASED_OUTPATIENT_CLINIC_OR_DEPARTMENT_OTHER): Payer: Self-pay

## 2023-07-28 ENCOUNTER — Other Ambulatory Visit (HOSPITAL_BASED_OUTPATIENT_CLINIC_OR_DEPARTMENT_OTHER): Payer: Self-pay

## 2023-07-29 ENCOUNTER — Other Ambulatory Visit (HOSPITAL_BASED_OUTPATIENT_CLINIC_OR_DEPARTMENT_OTHER): Payer: Self-pay

## 2023-07-30 ENCOUNTER — Other Ambulatory Visit (HOSPITAL_BASED_OUTPATIENT_CLINIC_OR_DEPARTMENT_OTHER): Payer: Self-pay

## 2023-08-04 ENCOUNTER — Other Ambulatory Visit (HOSPITAL_BASED_OUTPATIENT_CLINIC_OR_DEPARTMENT_OTHER): Payer: Self-pay

## 2023-08-05 ENCOUNTER — Other Ambulatory Visit (HOSPITAL_BASED_OUTPATIENT_CLINIC_OR_DEPARTMENT_OTHER): Payer: Self-pay

## 2023-08-10 ENCOUNTER — Other Ambulatory Visit (HOSPITAL_BASED_OUTPATIENT_CLINIC_OR_DEPARTMENT_OTHER): Payer: Self-pay

## 2023-08-11 ENCOUNTER — Other Ambulatory Visit (HOSPITAL_BASED_OUTPATIENT_CLINIC_OR_DEPARTMENT_OTHER): Payer: Self-pay

## 2023-08-12 ENCOUNTER — Encounter: Payer: Self-pay | Admitting: Physician Assistant

## 2023-08-12 ENCOUNTER — Other Ambulatory Visit (HOSPITAL_BASED_OUTPATIENT_CLINIC_OR_DEPARTMENT_OTHER): Payer: Self-pay

## 2023-08-12 MED ORDER — FREESTYLE LIBRE 3 PLUS SENSOR MISC
3 refills | Status: AC
  Filled 2023-08-12 – 2023-09-14 (×2): qty 6, 90d supply, fill #0
  Filled 2023-12-10: qty 6, 90d supply, fill #1
  Filled 2024-04-15: qty 6, 90d supply, fill #2

## 2023-08-12 NOTE — Telephone Encounter (Signed)
 Ok to send freestyle libre 3 plus.

## 2023-09-15 ENCOUNTER — Ambulatory Visit: Admitting: Physician Assistant

## 2023-09-15 ENCOUNTER — Other Ambulatory Visit (HOSPITAL_BASED_OUTPATIENT_CLINIC_OR_DEPARTMENT_OTHER): Payer: Self-pay

## 2023-09-15 ENCOUNTER — Encounter: Payer: Self-pay | Admitting: Physician Assistant

## 2023-09-15 VITALS — BP 129/74 | HR 92 | Ht 66.0 in | Wt 287.0 lb

## 2023-09-15 DIAGNOSIS — R5382 Chronic fatigue, unspecified: Secondary | ICD-10-CM

## 2023-09-15 DIAGNOSIS — R7303 Prediabetes: Secondary | ICD-10-CM

## 2023-09-15 DIAGNOSIS — E66813 Obesity, class 3: Secondary | ICD-10-CM

## 2023-09-15 DIAGNOSIS — G4733 Obstructive sleep apnea (adult) (pediatric): Secondary | ICD-10-CM

## 2023-09-15 DIAGNOSIS — E559 Vitamin D deficiency, unspecified: Secondary | ICD-10-CM | POA: Diagnosis not present

## 2023-09-15 DIAGNOSIS — Z6841 Body Mass Index (BMI) 40.0 and over, adult: Secondary | ICD-10-CM

## 2023-09-15 DIAGNOSIS — Z1211 Encounter for screening for malignant neoplasm of colon: Secondary | ICD-10-CM

## 2023-09-15 DIAGNOSIS — E8881 Metabolic syndrome: Secondary | ICD-10-CM | POA: Diagnosis not present

## 2023-09-15 MED ORDER — TIRZEPATIDE 10 MG/0.5ML ~~LOC~~ SOAJ: SUBCUTANEOUS | Status: DC

## 2023-09-15 NOTE — Progress Notes (Signed)
 Established Patient Office Visit  Subjective   Patient ID: Jennifer Erickson, female    DOB: 1970/10/02  Age: 53 y.o. MRN: [id number]  Chief Complaint  Patient presents with   Medical Management of Chronic Issues    Fatigue     HPI Pt is a 53 yo obese female who presents to the clinic with fatigue. She has CPAP but not able to tolerate. She would have to lose weight to get inspire implant. She wants to try zepbound  for weight loss and was sent by Dr. ONEIDA but insurance did not approve it. She has to take at least 2 naps a day. She wonders if there is anything else she can do.   .. 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   Left shoulder pain 12/03/2016   Cough 07/03/2017   Elevated blood pressure reading 09/24/2017   Alopecia, 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   Effect of high altitude on sinuses 07/23/2018   Metabolic syndrome with hyperglycemia 07/23/2018   Lumbar degenerative disc disease 06/06/2019   Numbness and tingling in both hands 08/18/2019   Trochanteric bursitis, right hip 01/04/2020   PND (post-nasal drip) 01/24/2020   Pre-diabetes 09/17/2020   Laceration of scalp 04/23/2021   Radiculitis of right cervical region 10/11/2021   Viral infection 11/01/2021   Asteatosis 03/23/2019   Biceps tendinitis, right, distal 07/18/2022   Bilateral hamstring strain 07/18/2022   Elevated  hemoglobin A1c 09/05/2022   Chronic venous stasis 09/08/2022   Sweating increase 10/14/2022   Leg cramps 10/14/2022   Sinobronchitis 10/21/2022   Chronic fatigue 09/15/2023   Resolved Ambulatory Problems    Diagnosis Date Noted   Headache 08/19/2011   Uvular swelling 12/30/2011   Insomnia 12/30/2011   Dermatitis 05/26/2012   Obstructive sleep apnea 11/22/2012   Left lumbar radiculitis 01/25/2014   Flank pain 11/01/2014   Right-sided low back pain without sciatica 05/07/2015   Cramps of left lower extremity 11/28/2015   Polyarthralgia 02/18/2016   Toe fracture, right 04/15/2016   Lateral epicondylitis of both elbows 12/03/2016   Grief reaction 12/26/2016   Acute frontal sinusitis 06/18/2018   Annual physical exam 02/17/2018   Pain of left heel 10/19/2018   Past Medical History:  Diagnosis Date   Lupus    Personal history of amenorrhea    Sinusitis      ROS See HPI.    Objective:     BP 129/74   Pulse 92   Ht 5' 6 (1.676 m)   Wt 287 lb (130.2 kg)   SpO2 99%   BMI 46.32 kg/m  BP Readings from Last 3 Encounters:  09/15/23 129/74  06/16/23 (!) 150/97  03/17/23 120/84   Wt Readings from Last 3 Encounters:  09/15/23 287 lb (130.2 kg)  03/17/23 269 lb 1 oz (122 kg)  01/27/23 277 lb (125.6 kg)      Physical Exam Constitutional:  Appearance: Normal appearance. She is obese.  HENT:     Head: Normocephalic.   Cardiovascular:     Rate and Rhythm: Normal rate and regular rhythm.  Pulmonary:     Effort: Pulmonary effort is normal.     Comments: Bilateral large breast  Musculoskeletal:     Cervical back: Normal range of motion and neck supple.   Neurological:     Mental Status: She is alert.          Assessment & Plan:  SABRASABRATonya [name] was seen today for medical management of chronic issues.  Diagnoses and all orders for this visit:  Class 3 severe obesity due to excess calories with serious comorbidity and body mass index (BMI) of 45.0 to  49.9 in adult -     Cologuard -     Lipid panel -     CMP14+EGFR -     CBC w/Diff/Platelet -     Fe+TIBC+Fer -     TSH + free T4 -     VITAMIN D  25 Hydroxy (Vit-D Deficiency, Fractures) -     B12 and Folate Panel -     tirzepatide  (MOUNJARO ) 10 MG/0.5ML Pen; Liposlim.  Tirzepatide /Pyridoxine/Thiamine/L-Carnitine 10mg /mL.  Inject 2.5 mg/25 units subcu weekly for 4 weeks then 5 mg/50 units subcu weekly for 4 weeks then 7.5 mg/75 units subcu weekly for 4 weeks then 10 mg/100 units subcu weekly for 4 weeks then 15 mg/150 units subcu weekly  Chronic fatigue -     Cologuard -     Lipid panel -     CMP14+EGFR -     CBC w/Diff/Platelet -     Fe+TIBC+Fer -     TSH + free T4 -     VITAMIN D  25 Hydroxy (Vit-D Deficiency, Fractures) -     B12 and Folate Panel  Metabolic syndrome with hyperglycemia -     Lipid panel -     tirzepatide  (MOUNJARO ) 10 MG/0.5ML Pen; Liposlim.  Tirzepatide /Pyridoxine/Thiamine/L-Carnitine 10mg /mL.  Inject 2.5 mg/25 units subcu weekly for 4 weeks then 5 mg/50 units subcu weekly for 4 weeks then 7.5 mg/75 units subcu weekly for 4 weeks then 10 mg/100 units subcu weekly for 4 weeks then 15 mg/150 units subcu weekly  Pre-diabetes -     Lipid panel -     CMP14+EGFR -     CBC w/Diff/Platelet -     Fe+TIBC+Fer  Vitamin D  deficiency -     VITAMIN D  25 Hydroxy (Vit-D Deficiency, Fractures)  Colon cancer screening -     Cologuard  OSA on CPAP -     tirzepatide  (MOUNJARO ) 10 MG/0.5ML Pen; Liposlim.  Tirzepatide /Pyridoxine/Thiamine/L-Carnitine 10mg /mL.  Inject 2.5 mg/25 units subcu weekly for 4 weeks then 5 mg/50 units subcu weekly for 4 weeks then 7.5 mg/75 units subcu weekly for 4 weeks then 10 mg/100 units subcu weekly for 4 weeks then 15 mg/150 units subcu weekly   Reviewing denial for zepbound  found that OSA was not added to problem list Will resubmit Discussed compounded liposlim and prescription given if not covered by insurance Discussed titration up and side  effects Follow up in 3 months If 2.5mg  approved after one month will send 5mg  weekly  Fasting labs ordered today Cologuard ordered   Return in about 3 months (around 12/16/2023) for weight.    Ladarious Kresse, PA-C

## 2023-09-15 NOTE — Patient Instructions (Addendum)
 Consider compounded liposlim for weight loss.  Labs to evaluate for fatigue.  Cologuard ordered for colon cancer screening.  Due for CPE.  Work on weight loss for the next 3 months then follow up.

## 2023-09-16 ENCOUNTER — Other Ambulatory Visit (HOSPITAL_BASED_OUTPATIENT_CLINIC_OR_DEPARTMENT_OTHER): Payer: Self-pay

## 2023-09-16 DIAGNOSIS — Z1231 Encounter for screening mammogram for malignant neoplasm of breast: Secondary | ICD-10-CM | POA: Diagnosis not present

## 2023-09-17 ENCOUNTER — Other Ambulatory Visit (INDEPENDENT_AMBULATORY_CARE_PROVIDER_SITE_OTHER)

## 2023-09-17 ENCOUNTER — Ambulatory Visit: Admitting: Sports Medicine

## 2023-09-17 ENCOUNTER — Encounter: Payer: Self-pay | Admitting: Sports Medicine

## 2023-09-17 ENCOUNTER — Other Ambulatory Visit (HOSPITAL_BASED_OUTPATIENT_CLINIC_OR_DEPARTMENT_OTHER): Payer: Self-pay

## 2023-09-17 DIAGNOSIS — E8881 Metabolic syndrome: Secondary | ICD-10-CM | POA: Diagnosis not present

## 2023-09-17 DIAGNOSIS — M170 Bilateral primary osteoarthritis of knee: Secondary | ICD-10-CM

## 2023-09-17 DIAGNOSIS — E66813 Obesity, class 3: Secondary | ICD-10-CM

## 2023-09-17 DIAGNOSIS — Z6841 Body Mass Index (BMI) 40.0 and over, adult: Secondary | ICD-10-CM | POA: Diagnosis not present

## 2023-09-17 DIAGNOSIS — R5382 Chronic fatigue, unspecified: Secondary | ICD-10-CM | POA: Diagnosis not present

## 2023-09-17 DIAGNOSIS — R7303 Prediabetes: Secondary | ICD-10-CM | POA: Diagnosis not present

## 2023-09-17 DIAGNOSIS — G4733 Obstructive sleep apnea (adult) (pediatric): Secondary | ICD-10-CM | POA: Diagnosis not present

## 2023-09-17 DIAGNOSIS — E559 Vitamin D deficiency, unspecified: Secondary | ICD-10-CM | POA: Diagnosis not present

## 2023-09-17 MED ORDER — TRIAMCINOLONE ACETONIDE 40 MG/ML IJ SUSP
40.0000 mg | Freq: Once | INTRAMUSCULAR | Status: AC
  Administered 2023-09-17: 40 mg via INTRAMUSCULAR

## 2023-09-17 NOTE — Assessment & Plan Note (Signed)
 Jennifer Erickson does have some irritation posterior pharynx with her CPAP, the CPAP device is humidified. She was wondering about the inspire device, I explained to her how this works. We will focus more on weight loss first with medication, exercise, dieting, and if this fails we will consider bariatric surgery before considering something like the inspire device.

## 2023-09-17 NOTE — Assessment & Plan Note (Signed)
 Morbid obesity with multiple comorbidities including disc disease, osteoarthritis, obstructive sleep apnea, high blood pressure. She did have Zepbound  called in some time ago, it sounds like there was a denial based on the need to try a couple of generic agents, the listed generic agents were all amphetamines. Based on her high blood pressure, history of palpitations with premature atrial and ventricular contractions she is not a candidate/there is a contraindication to amphetamine oral treatment. Thus I think she is a good candidate for Zepbound . I have sent a message to the Rx prior Auth team regarding this to resubmit.  Again, she will be enrolled in a multidisciplinary weight loss program with calorie counting, and exercise prescription, she has no contraindications to GLP-1 treatment and she will not be on any additional weight loss medication.

## 2023-09-17 NOTE — Assessment & Plan Note (Signed)
 Increasing pain, mostly right knee, injected today, we will continue to work aggressively on weight loss. Return to see me in 6 to 12 weeks. Visco next if not better.

## 2023-09-17 NOTE — Telephone Encounter (Signed)
 Good morning Jennifer Erickson,  I was able to have a discussion with the patient today, it looks like the requirement for trial, intolerance or contraindication to the previous oral medications needs to be addressed.  Jennifer Erickson does have a history of palpitations, PACs, and PVCs, this is essentially a contraindication to use of the amphetamines listed.  Could you try to run Zepbound  again with notations that she has a contraindication to all 4 of those medications listed.  Thank you!  ___________________________________________ Joselyn Nicely. Sandy Crumb, M.D., ABFM., CAQSM., AME. Primary Care and Sports Medicine  [facility]  Adjunct Instructor of Family Medicine  [employer]  Restaurant manager, fast food

## 2023-09-17 NOTE — Addendum Note (Signed)
 Addended by: OLIVA-AVELLANEDA, Lem Peary L on: 09/17/2023 09:21 AM   Modules accepted: Orders

## 2023-09-17 NOTE — Progress Notes (Signed)
    Procedures performed today:    Procedure: Real-time Ultrasound Guided injection of the right knee Device: Samsung HS60  Verbal informed consent obtained.  Time-out conducted.  Noted no overlying erythema, induration, or other signs of local infection.  Skin prepped in a sterile fashion.  Local anesthesia: Topical Ethyl chloride.  With sterile technique and under real time ultrasound guidance: No effusion, 1 cc Kenalog  40, 2 cc lidocaine , 2 cc bupivacaine injected easily Completed without difficulty  Advised to call if fevers/chills, erythema, induration, drainage, or persistent bleeding.  Images permanently stored and available for review in PACS.  Impression: Technically successful ultrasound guided injection.  Independent interpretation of notes and tests performed by another provider:   None.  Brief History, Exam, Impression, and Recommendations:    Primary osteoarthritis of both knees Increasing pain, mostly right knee, injected today, we will continue to work aggressively on weight loss. Return to see me in 6 to 12 weeks. Visco next if not better.  Class 3 severe obesity due to excess calories with serious comorbidity and body mass index (BMI) of 45.0 to 49.9 in adult [facility]) Morbid obesity with multiple comorbidities including disc disease, osteoarthritis, obstructive sleep apnea, high blood pressure. She did have Zepbound  called in some time ago, it sounds like there was a denial based on the need to try a couple of generic agents, the listed generic agents were all amphetamines. Based on her high blood pressure, history of palpitations with premature atrial and ventricular contractions she is not a candidate/there is a contraindication to amphetamine oral treatment. Thus I think she is a good candidate for Zepbound . I have sent a message to the Rx prior Auth team regarding this to resubmit.  Again, she will be enrolled in a multidisciplinary weight loss program with  calorie counting, and exercise prescription, she has no contraindications to GLP-1 treatment and she will not be on any additional weight loss medication.  OSA on CPAP [name] does have some irritation posterior pharynx with her CPAP, the CPAP device is humidified. She was wondering about the inspire device, I explained to her how this works. We will focus more on weight loss first with medication, exercise, dieting, and if this fails we will consider bariatric surgery before considering something like the inspire device.    ____________________________________________ Jennifer Erickson. Jennifer Erickson, M.D., ABFM., CAQSM., AME. Primary Care and Sports Medicine [place] [facility]  Adjunct Professor of [facility] Medicine  [employer]  Restaurant manager, fast food

## 2023-09-18 ENCOUNTER — Other Ambulatory Visit (HOSPITAL_BASED_OUTPATIENT_CLINIC_OR_DEPARTMENT_OTHER): Payer: Self-pay

## 2023-09-18 ENCOUNTER — Ambulatory Visit: Payer: Self-pay | Admitting: Physician Assistant

## 2023-09-18 DIAGNOSIS — Z6841 Body Mass Index (BMI) 40.0 and over, adult: Secondary | ICD-10-CM

## 2023-09-18 DIAGNOSIS — R7989 Other specified abnormal findings of blood chemistry: Secondary | ICD-10-CM

## 2023-09-18 DIAGNOSIS — E673 Hypervitaminosis D: Secondary | ICD-10-CM

## 2023-09-18 DIAGNOSIS — R946 Abnormal results of thyroid function studies: Secondary | ICD-10-CM

## 2023-09-18 LAB — IRON,TIBC AND FERRITIN PANEL
Ferritin: 368 ng/mL — ABNORMAL HIGH (ref 15–150)
Iron Saturation: 22 % (ref 15–55)
Iron: 77 ug/dL (ref 27–159)
Total Iron Binding Capacity: 348 ug/dL (ref 250–450)
UIBC: 271 ug/dL (ref 131–425)

## 2023-09-18 LAB — CMP14+EGFR
ALT: 23 IU/L (ref 0–32)
AST: 20 IU/L (ref 0–40)
Albumin: 3.8 g/dL (ref 3.8–4.9)
Alkaline Phosphatase: 131 IU/L — ABNORMAL HIGH (ref 44–121)
BUN/Creatinine Ratio: 14 (ref 9–23)
BUN: 13 mg/dL (ref 6–24)
Bilirubin Total: <0.2 mg/dL (ref 0.0–1.2)
CO2: 17 mmol/L — ABNORMAL LOW (ref 20–29)
Calcium: 9.3 mg/dL (ref 8.7–10.2)
Chloride: 103 mmol/L (ref 96–106)
Creatinine, Ser: 0.91 mg/dL (ref 0.57–1.00)
Globulin, Total: 4.8 g/dL — ABNORMAL HIGH (ref 1.5–4.5)
Glucose: 89 mg/dL (ref 70–99)
Potassium: 4.5 mmol/L (ref 3.5–5.2)
Sodium: 140 mmol/L (ref 134–144)
Total Protein: 8.6 g/dL — ABNORMAL HIGH (ref 6.0–8.5)
eGFR: 76 mL/min/{1.73_m2} (ref >59)

## 2023-09-18 LAB — LIPID PANEL
Chol/HDL Ratio: 3.3 ratio (ref 0.0–4.4)
Cholesterol, Total: 183 mg/dL (ref 100–199)
HDL: 56 mg/dL (ref >39)
LDL Chol Calc (NIH): 115 mg/dL — ABNORMAL HIGH (ref 0–99)
Triglycerides: 66 mg/dL (ref 0–149)
VLDL Cholesterol Cal: 12 mg/dL (ref 5–40)

## 2023-09-18 LAB — CBC WITH DIFFERENTIAL/PLATELET
Basophils Absolute: 0 10*3/uL (ref 0.0–0.2)
Basos: 0 %
EOS (ABSOLUTE): 0.2 10*3/uL (ref 0.0–0.4)
Eos: 3 %
Hematocrit: 41.8 % (ref 34.0–46.6)
Hemoglobin: 13.4 g/dL (ref 11.1–15.9)
Immature Grans (Abs): 0 10*3/uL (ref 0.0–0.1)
Immature Granulocytes: 0 %
Lymphocytes Absolute: 2.4 10*3/uL (ref 0.7–3.1)
Lymphs: 40 %
MCH: 30.5 pg (ref 26.6–33.0)
MCHC: 32.1 g/dL (ref 31.5–35.7)
MCV: 95 fL (ref 79–97)
Monocytes Absolute: 0.5 10*3/uL (ref 0.1–0.9)
Monocytes: 8 %
Neutrophils Absolute: 2.8 10*3/uL (ref 1.4–7.0)
Neutrophils: 49 %
Platelets: 274 10*3/uL (ref 150–450)
RBC: 4.39 x10E6/uL (ref 3.77–5.28)
RDW: 12.9 % (ref 11.7–15.4)
WBC: 5.9 10*3/uL (ref 3.4–10.8)

## 2023-09-18 LAB — VITAMIN D 25 HYDROXY (VIT D DEFICIENCY, FRACTURES): Vit D, 25-Hydroxy: 102 ng/mL — ABNORMAL HIGH (ref 30.0–100.0)

## 2023-09-18 LAB — B12 AND FOLATE PANEL
Folate: 12.5 ng/mL (ref >3.0)
Vitamin B-12: 309 pg/mL (ref 232–1245)

## 2023-09-18 LAB — TSH+FREE T4
Free T4: 0.8 ng/dL — ABNORMAL LOW (ref 0.82–1.77)
TSH: 2.55 u[IU]/mL (ref 0.450–4.500)

## 2023-09-18 MED ORDER — ZEPBOUND 2.5 MG/0.5ML ~~LOC~~ SOAJ
2.5000 mg | SUBCUTANEOUS | 0 refills | Status: DC
  Filled 2023-09-18 – 2023-10-19 (×3): qty 2, 28d supply, fill #0

## 2023-09-18 NOTE — Progress Notes (Signed)
 Jennifer Erickson,   Vitamin D  is TOO high-cut dose in half daily and recheck in 3 months. It can be toxic to have too much vitamin d .   Hemoglobin looks good.  Folate is good.  B12 low normal. Start b12 1000mcg daily. Recheck in 3 months.  TSH stable and free t4 low side. Recheck in 3 months.  Cholesterol looks good.  Protein elevated-how much protein are you eating?  Ferritin elevated could be due to the high vitamin D . Recheck in 3 months Stop any iron supplements.

## 2023-09-21 ENCOUNTER — Other Ambulatory Visit (HOSPITAL_BASED_OUTPATIENT_CLINIC_OR_DEPARTMENT_OTHER): Payer: Self-pay

## 2023-09-22 ENCOUNTER — Other Ambulatory Visit (HOSPITAL_COMMUNITY): Payer: Self-pay

## 2023-09-22 ENCOUNTER — Telehealth: Payer: Self-pay

## 2023-09-22 ENCOUNTER — Other Ambulatory Visit (HOSPITAL_BASED_OUTPATIENT_CLINIC_OR_DEPARTMENT_OTHER): Payer: Self-pay

## 2023-09-22 NOTE — Telephone Encounter (Signed)
 Pharmacy Patient Advocate Encounter   Received notification from CoverMyMeds that prior authorization for Zepbound  2.5mg /0.48ml is required/requested.   Insurance verification completed.   The patient is insured through [facility] .   Per test claim: PA required; PA submitted to above mentioned insurance via CoverMyMeds Key/confirmation #/EOC [id number] Status is pending   Dx: OSA

## 2023-09-22 NOTE — Telephone Encounter (Signed)
 Pharmacy Patient Advocate Encounter  Received notification from [facility] that Prior Authorization for Zepbound 2.5mg /0.45ml has been DENIED.  See denial reason below. No denial letter attached in CMM. Will attach denial letter to Media tab once received.

## 2023-09-23 ENCOUNTER — Other Ambulatory Visit (HOSPITAL_BASED_OUTPATIENT_CLINIC_OR_DEPARTMENT_OTHER): Payer: Self-pay

## 2023-09-24 ENCOUNTER — Other Ambulatory Visit (HOSPITAL_BASED_OUTPATIENT_CLINIC_OR_DEPARTMENT_OTHER): Payer: Self-pay

## 2023-09-25 ENCOUNTER — Other Ambulatory Visit (HOSPITAL_BASED_OUTPATIENT_CLINIC_OR_DEPARTMENT_OTHER): Payer: Self-pay

## 2023-09-28 ENCOUNTER — Other Ambulatory Visit (HOSPITAL_BASED_OUTPATIENT_CLINIC_OR_DEPARTMENT_OTHER): Payer: Self-pay

## 2023-09-29 ENCOUNTER — Other Ambulatory Visit (HOSPITAL_BASED_OUTPATIENT_CLINIC_OR_DEPARTMENT_OTHER): Payer: Self-pay

## 2023-09-30 ENCOUNTER — Other Ambulatory Visit (HOSPITAL_BASED_OUTPATIENT_CLINIC_OR_DEPARTMENT_OTHER): Payer: Self-pay

## 2023-09-30 LAB — COLOGUARD: COLOGUARD: NEGATIVE

## 2023-09-30 NOTE — Telephone Encounter (Signed)
 Please review the chart message from the patient. She is requesting to complete an appeal process for Zepbound . Thanks in advance.

## 2023-09-30 NOTE — Telephone Encounter (Signed)
 The PA for Zepbound  was denied by the insurance. Per the insurance, patient must have tried and failed the following alternative medications: benzphetamine, diethylpropion, phentermine , and Qysmia. Patient has been updated of the insurance determination via a MyChart message.

## 2023-10-01 ENCOUNTER — Other Ambulatory Visit (HOSPITAL_COMMUNITY): Payer: Self-pay

## 2023-10-01 ENCOUNTER — Other Ambulatory Visit (HOSPITAL_BASED_OUTPATIENT_CLINIC_OR_DEPARTMENT_OTHER): Payer: Self-pay

## 2023-10-01 NOTE — Telephone Encounter (Signed)
 Patient requests an appeal to the Zepbound . Has a dx of OSA   Per denial letter:

## 2023-10-01 NOTE — Progress Notes (Signed)
 Great news! Your Cologuard test is negative.  Recommend repeat colon cancer screening in 3 years.

## 2023-10-04 ENCOUNTER — Other Ambulatory Visit (HOSPITAL_BASED_OUTPATIENT_CLINIC_OR_DEPARTMENT_OTHER): Payer: Self-pay

## 2023-10-05 ENCOUNTER — Other Ambulatory Visit (HOSPITAL_BASED_OUTPATIENT_CLINIC_OR_DEPARTMENT_OTHER): Payer: Self-pay

## 2023-10-06 ENCOUNTER — Other Ambulatory Visit (HOSPITAL_BASED_OUTPATIENT_CLINIC_OR_DEPARTMENT_OTHER): Payer: Self-pay

## 2023-10-06 ENCOUNTER — Encounter: Payer: Self-pay | Admitting: Pharmacist

## 2023-10-06 ENCOUNTER — Telehealth: Payer: Self-pay | Admitting: Pharmacist

## 2023-10-06 DIAGNOSIS — G4733 Obstructive sleep apnea (adult) (pediatric): Secondary | ICD-10-CM | POA: Diagnosis not present

## 2023-10-06 DIAGNOSIS — M170 Bilateral primary osteoarthritis of knee: Secondary | ICD-10-CM | POA: Diagnosis not present

## 2023-10-06 DIAGNOSIS — L930 Discoid lupus erythematosus: Secondary | ICD-10-CM | POA: Diagnosis not present

## 2023-10-06 NOTE — Telephone Encounter (Signed)
 Appeal has been submitted for Zepbound . Will advise when response is received, please be advised that most companies may take 30 days to make a decision. Appeal letter and supporting documentation have been faxed to [phone] on 10/05/2023 @12 :12 pm.  Spoke with patient via phone to obtain permission to submit appeal on her behalf.  Thank you, Devere Pandy, PharmD Clinical Pharmacist  [place]  Direct Dial: [phone]

## 2023-10-06 NOTE — Telephone Encounter (Signed)
 Additional information has been requested from the patient's insurance in order to proceed with the appeal request. Requested information has been sent, or form has been filled out and faxed back to [phone]

## 2023-10-07 ENCOUNTER — Other Ambulatory Visit (HOSPITAL_BASED_OUTPATIENT_CLINIC_OR_DEPARTMENT_OTHER): Payer: Self-pay

## 2023-10-08 ENCOUNTER — Other Ambulatory Visit (HOSPITAL_BASED_OUTPATIENT_CLINIC_OR_DEPARTMENT_OTHER): Payer: Self-pay

## 2023-10-08 MED ORDER — HYDROXYCHLOROQUINE SULFATE 200 MG PO TABS
200.0000 mg | ORAL_TABLET | Freq: Two times a day (BID) | ORAL | 3 refills | Status: AC
  Filled 2023-10-08: qty 180, 90d supply, fill #0
  Filled 2023-12-31 – 2024-01-21 (×3): qty 180, 90d supply, fill #1
  Filled 2024-04-15: qty 180, 90d supply, fill #2

## 2023-10-09 MED ORDER — TIRZEPATIDE-WEIGHT MANAGEMENT 2.5 MG/0.5ML ~~LOC~~ SOLN
2.5000 mg | SUBCUTANEOUS | 0 refills | Status: DC
Start: 2023-10-09 — End: 2023-10-29

## 2023-10-09 NOTE — Addendum Note (Signed)
 Addended by: [name] on: 10/09/2023 04:04 PM   Modules accepted: Orders

## 2023-10-17 ENCOUNTER — Other Ambulatory Visit (HOSPITAL_BASED_OUTPATIENT_CLINIC_OR_DEPARTMENT_OTHER): Payer: Self-pay

## 2023-10-17 ENCOUNTER — Other Ambulatory Visit: Payer: Self-pay

## 2023-10-19 ENCOUNTER — Other Ambulatory Visit (HOSPITAL_BASED_OUTPATIENT_CLINIC_OR_DEPARTMENT_OTHER): Payer: Self-pay

## 2023-10-19 NOTE — Telephone Encounter (Signed)
 Zepbound  approved. Pt should be able to pick up at pharmacy. Do we need to send the 5mg  dose if she has already started compounded?

## 2023-10-19 NOTE — Telephone Encounter (Signed)
 Insurance has approved the appeal for Zepbound:    Thank you, Dellie Burns, PharmD Clinical Pharmacist  [place]  Direct Dial: [phone]

## 2023-10-20 NOTE — Telephone Encounter (Signed)
 Spoke with patient states still too expensive through insurance. [phone] with $150 discount card as well as PA approval.  She is requesting to continue with lilly direct. As this will cost her $340. States that she has the 2.5mg   and has only used on injection and has done o.k. with this so far. She does not feel she needs to go up on medication yet.

## 2023-10-28 ENCOUNTER — Other Ambulatory Visit: Payer: Self-pay | Admitting: Physician Assistant

## 2023-11-06 NOTE — Telephone Encounter (Signed)
 Can you get liposlim paper copy and fill out demographics and I will put rx details and sign for fax. Thanks!

## 2023-11-17 ENCOUNTER — Other Ambulatory Visit: Payer: Self-pay

## 2023-11-17 ENCOUNTER — Other Ambulatory Visit (HOSPITAL_BASED_OUTPATIENT_CLINIC_OR_DEPARTMENT_OTHER): Payer: Self-pay

## 2023-11-17 ENCOUNTER — Ambulatory Visit
Admission: RE | Admit: 2023-11-17 | Discharge: 2023-11-17 | Disposition: A | Discharge status: Home / Self Care | Attending: Family Medicine | Admitting: Family Medicine

## 2023-11-17 VITALS — BP 127/88 | HR 100 | Temp 98.1°F | Resp 16

## 2023-11-17 DIAGNOSIS — H9203 Otalgia, bilateral: Secondary | ICD-10-CM | POA: Diagnosis not present

## 2023-11-17 DIAGNOSIS — H6691 Otitis media, unspecified, right ear: Secondary | ICD-10-CM

## 2023-11-17 DIAGNOSIS — H6123 Impacted cerumen, bilateral: Secondary | ICD-10-CM

## 2023-11-17 MED ORDER — AZITHROMYCIN 500 MG PO TABS
500.0000 mg | ORAL_TABLET | Freq: Every day | ORAL | 0 refills | Status: AC
  Filled 2023-11-17: qty 5, 5d supply, fill #0

## 2023-11-17 NOTE — ED Provider Notes (Signed)
 Jennifer Erickson    CSN: [id number] Arrival date & time: 11/17/23  0836      History   Chief Complaint Chief Complaint  Patient presents with   Ear Pain    HPI Jennifer Erickson is a 53 y.o. female.   HPI 53 year old female presents with bilateral ear pain for 4 days.  PMH significant for obesity, lupus, and chronic venous stasis.  Past Medical History:  Diagnosis Date   Lupus    Personal history of amenorrhea    Sinusitis     Patient Active Problem List   Diagnosis Date Noted   Chronic fatigue 09/15/2023   Sinobronchitis 10/21/2022   Sweating increase 10/14/2022   Leg cramps 10/14/2022   Chronic venous stasis 09/08/2022   Elevated hemoglobin A1c 09/05/2022   Biceps tendinitis, right, distal 07/18/2022   Bilateral hamstring strain 07/18/2022   Viral infection 11/01/2021   Radiculitis of right cervical region 10/11/2021   Laceration of scalp 04/23/2021   Pre-diabetes 09/17/2020   PND (post-nasal drip) 01/24/2020   Trochanteric bursitis, right hip 01/04/2020   Numbness and tingling in both hands 08/18/2019   Lumbar degenerative disc disease 06/06/2019   Asteatosis 03/23/2019   Effect of high altitude on sinuses 07/23/2018   Metabolic syndrome with hyperglycemia 07/23/2018   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   Alopecia, 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   Elevated blood pressure reading 09/24/2017   Cough 07/03/2017   Left shoulder pain 12/03/2016   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   Palpitations 07/11/2015   OSA on CPAP 07/10/2015   Large breasts 05/28/2015   Left tennis elbow 05/28/2015   Primary osteoarthritis of both knees 12/08/2014   Vitamin D  deficiency 04/19/2014   Bilateral swelling of feet 05/02/2013   Left breast mass 11/22/2012   Plantar fasciitis, bilateral 11/01/2012   Class 3 severe obesity due to excess calories with serious comorbidity and body mass index (BMI) of 45.0 to 49.9 in adult  08/19/2011   Uvular swelling 08/19/2011    Past Surgical History:  Procedure Laterality Date   DILATION AND CURETTAGE OF UTERUS     nexplanon  insertion     TONSILLECTOMY      OB History     Gravida  0   Para  0   Term  0   Preterm  0   AB  0   Living  0      SAB  0   IAB  0   Ectopic  0   Multiple  0   Live Births  0            Home Medications    Prior to Admission medications   Medication Sig Start Date End Date Taking? Authorizing Provider  azithromycin  (ZITHROMAX ) 500 MG tablet Take 1 tablet (500 mg total) by mouth daily for 5 days. 11/17/23 11/22/23 Yes Teddy Sharper, FNP  etonogestrel  (NEXPLANON ) 68 MG IMPL implant 1 each by Subdermal route once. 12/25/21  Yes [provider]  hydroxychloroquine  (PLAQUENIL ) 200 MG tablet Take 1 tablet (200 mg total) by mouth 2 (two) times daily with food or milk. 10/08/23  Yes   Multiple Vitamins-Minerals (ZINC PO) Take by mouth as needed.   Yes [provider]  tirzepatide  (ZEPBOUND ) 2.5 MG/0.5ML Pen Inject 2.5 mg into the skin once a week. 09/18/23  Yes Breeback, Jade L, PA-C  VITAMIN D  PO Take by mouth.   Yes [provider]  Continuous Glucose Sensor (FREESTYLE LIBRE 3  PLUS SENSOR) MISC Change sensor every 15 days. 08/12/23   Breeback, Jade L, PA-C  tirzepatide  (MOUNJARO ) 10 MG/0.5ML Pen Liposlim.  Tirzepatide /Pyridoxine/Thiamine/L-Carnitine 10mg /mL.  Inject 2.5 mg/25 units subcu weekly for 4 weeks then 5 mg/50 units subcu weekly for 4 weeks then 7.5 mg/75 units subcu weekly for 4 weeks then 10 mg/100 units subcu weekly for 4 weeks then 15 mg/150 units subcu weekly 09/15/23   Breeback, Jade L, PA-C  ZEPBOUND  2.5 MG/0.5ML injection vial INJECT 0.5 ML (2.5 MG) UNDER THE SKIN ONCE WEEKLY (0.5ML= 50 UNITS) 10/29/23   [name], PA-C    Family History Family History  Problem Relation Age of Onset   Cancer Mother        brain   Hypertension Father    Diabetes Brother     Social  History Social History   Tobacco Use   Smoking status: Never   Smokeless tobacco: Never  Vaping Use   Vaping status: Never Used  Substance Use Topics   Alcohol use: No   Drug use: No     Allergies   Aspirin, Augmentin  [amoxicillin -pot clavulanate], Ciprofloxacin , Hydrocodone , Ivp dye [iodinated contrast media], and Sulfa  antibiotics   Review of Systems Review of Systems  HENT:  Positive for ear pain.   All other systems reviewed and are negative.    Physical Exam Triage Vital Signs ED Triage Vitals  Encounter Vitals Group     BP      Girls Systolic BP Percentile      Girls Diastolic BP Percentile      Boys Systolic BP Percentile      Boys Diastolic BP Percentile      Pulse      Resp      Temp      Temp src      SpO2      Weight      Height      Head Circumference      Peak Flow      Pain Score      Pain Loc      Pain Education      Exclude from Growth Chart    No data found.  Updated Vital Signs BP 127/88   Pulse 100   Temp 98.1 F (36.7 C) (Oral)   Resp 16   SpO2 97%   Physical Exam Vitals and nursing note reviewed.  Constitutional:      Appearance: Normal appearance. She is obese. She is ill-appearing.  HENT:     Head: Normocephalic and atraumatic.     Right Ear: External ear normal.     Left Ear: External ear normal.     Ears:     Comments: Bilateral EACs occluded with excessive cerumen unable to visualize either TM.  Post bilateral ear lavage: Left EAC-clear, Left TM-clear, mobile, good light reflex; Right EAC-clear, Right TM-erythematous, bulging    Mouth/Throat:     Mouth: Mucous membranes are moist.     Pharynx: Oropharynx is clear.  Eyes:     Extraocular Movements: Extraocular movements intact.     Conjunctiva/sclera: Conjunctivae normal.     Pupils: Pupils are equal, round, and reactive to light.  Cardiovascular:     Rate and Rhythm: Normal rate and regular rhythm.     Pulses: Normal pulses.  Pulmonary:     Effort: Pulmonary  effort is normal.     Breath sounds: Normal breath sounds. No wheezing, rhonchi or rales.  Musculoskeletal:  General: Normal range of motion.  Skin:    General: Skin is warm and dry.  Neurological:     General: No focal deficit present.     Mental Status: She is alert and oriented to person, place, and time. Mental status is at baseline.  Psychiatric:        Mood and Affect: Mood normal.        Behavior: Behavior normal.      UC Treatments / Results  Labs (all labs ordered are listed, but only abnormal results are displayed) Labs Reviewed - No data to display  EKG   Radiology No results found.  Procedures Procedures (including critical Erickson time)  Medications Ordered in UC Medications - No data to display  Initial Impression / Assessment and Plan / UC Course  I have reviewed the triage vital signs and the nursing notes.  Pertinent labs & imaging results that were available during my Erickson of the patient were reviewed by me and considered in my medical decision making (see chart for details).     MDM: 1.  Acute right otitis media-Rx'd Zithromax  500 mg tablet: Take 1 tablet daily x 5 days; 2.  Final Clinical Impressions(s) / UC Diagnoses   Final diagnoses:  Acute right otitis media  Excessive cerumen in both ear canals     Discharge Instructions      Advised patient to take medication as directed with food to completion.  Encouraged increase daily water intake to 64 ounces per day while taking this medication.  Advised if symptoms worsen and/or unresolved please follow-up with your PCP, ENT or here for further evaluation     ED Prescriptions     Medication Sig Dispense Auth. Provider   azithromycin  (ZITHROMAX ) 500 MG tablet Take 1 tablet (500 mg total) by mouth daily for 5 days. 5 tablet Mirza Fessel, FNP      PDMP not reviewed this encounter.   Teddy Sharper, FNP 11/17/23 [phone]

## 2023-11-17 NOTE — ED Triage Notes (Signed)
 C/O starting with right earache 4 days ago, now also having left earache as of yesterday, but right ear remains worse than left ear. Denies fever, congestion, or rhinorrhea. States pain worse with yawning and chewing.

## 2023-11-17 NOTE — Discharge Instructions (Addendum)
 Advised patient to take medication as directed with food to completion.  Encouraged increase daily water intake to 64 ounces per day while taking this medication.  Advised if symptoms worsen and/or unresolved please follow-up with your PCP, ENT or here for further evaluation.

## 2023-12-01 ENCOUNTER — Encounter: Payer: Self-pay | Admitting: Sports Medicine

## 2023-12-09 ENCOUNTER — Ambulatory Visit (INDEPENDENT_AMBULATORY_CARE_PROVIDER_SITE_OTHER)

## 2023-12-09 ENCOUNTER — Ambulatory Visit
Admission: RE | Admit: 2023-12-09 | Discharge: 2023-12-09 | Disposition: A | Discharge status: Home / Self Care | Attending: Family Medicine | Admitting: Family Medicine

## 2023-12-09 ENCOUNTER — Ambulatory Visit: Payer: Self-pay | Admitting: *Deleted

## 2023-12-09 ENCOUNTER — Other Ambulatory Visit (HOSPITAL_BASED_OUTPATIENT_CLINIC_OR_DEPARTMENT_OTHER): Payer: Self-pay

## 2023-12-09 VITALS — BP 168/111 | HR 88 | Temp 98.2°F | Resp 19

## 2023-12-09 DIAGNOSIS — M5412 Radiculopathy, cervical region: Secondary | ICD-10-CM

## 2023-12-09 DIAGNOSIS — M542 Cervicalgia: Secondary | ICD-10-CM

## 2023-12-09 DIAGNOSIS — M62838 Other muscle spasm: Secondary | ICD-10-CM

## 2023-12-09 MED ORDER — PREDNISONE 10 MG PO TABS
ORAL_TABLET | ORAL | 0 refills | Status: AC
  Filled 2023-12-09: qty 42, 12d supply, fill #0

## 2023-12-09 MED ORDER — KETOROLAC TROMETHAMINE 60 MG/2ML IM SOLN
60.0000 mg | Freq: Once | INTRAMUSCULAR | Status: AC
  Administered 2023-12-09: 60 mg via INTRAMUSCULAR

## 2023-12-09 MED ORDER — METHOCARBAMOL 500 MG PO TABS
500.0000 mg | ORAL_TABLET | Freq: Three times a day (TID) | ORAL | 0 refills | Status: DC | PRN
  Filled 2023-12-09: qty 21, 7d supply, fill #0

## 2023-12-09 NOTE — Discharge Instructions (Addendum)
 Advised patient of cervical spine x-ray results with hardcopy and images provided.  Advised patient to take medication as directed with food to completion.  Advised may use Robaxin  daily or as needed for muscle spasms of neck.  Encouraged to increase daily water intake to 64 ounces per day while taking these medications.  Advised if symptoms worsen and/or unresolved please follow-up with your PCP or here for further evaluation.

## 2023-12-09 NOTE — ED Provider Notes (Signed)
 [facility]    CSN: [id number] Arrival date & time: 12/09/23  1457      History   Chief Complaint Chief Complaint  Patient presents with   Neck Injury    HPI Jennifer Erickson is a 53 y.o. female.   HPI pleasant 53 year old female presents with neck pain causing burning sensation going down the middle of her shoulder blades 2 to 3 days.  Patient reports muscle skeletal pain between shoulder blades for 2 to 3 weeks prior to worsening of symptoms.  PMH significant for lupus, amenorrhea, and chronic venous stasis.  Past Medical History:  Diagnosis Date   Lupus    Personal history of amenorrhea    Sinusitis     Patient Active Problem List   Diagnosis Date Noted   Chronic fatigue 09/15/2023   Sinobronchitis 10/21/2022   Sweating increase 10/14/2022   Leg cramps 10/14/2022   Chronic venous stasis 09/08/2022   Elevated hemoglobin A1c 09/05/2022   Biceps tendinitis, right, distal 07/18/2022   Bilateral hamstring strain 07/18/2022   Viral infection 11/01/2021   Radiculitis of right cervical region 10/11/2021   Laceration of scalp 04/23/2021   Pre-diabetes 09/17/2020   PND (post-nasal drip) 01/24/2020   Trochanteric bursitis, right hip 01/04/2020   Numbness and tingling in both hands 08/18/2019   Lumbar degenerative disc disease 06/06/2019   Asteatosis 03/23/2019   Effect of high altitude on sinuses 07/23/2018   Metabolic syndrome with hyperglycemia 07/23/2018   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   Alopecia, 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   Elevated blood pressure reading 09/24/2017   Cough 07/03/2017   Left shoulder pain 12/03/2016   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   Palpitations 07/11/2015   OSA on CPAP 07/10/2015   Large breasts 05/28/2015   Left tennis elbow 05/28/2015   Primary osteoarthritis of both knees 12/08/2014   Vitamin D  deficiency 04/19/2014   Bilateral swelling of feet 05/02/2013   Left breast  mass 11/22/2012   Plantar fasciitis, bilateral 11/01/2012   Class 3 severe obesity due to excess calories with serious comorbidity and body mass index (BMI) of 45.0 to 49.9 in adult 08/19/2011   Uvular swelling 08/19/2011    Past Surgical History:  Procedure Laterality Date   DILATION AND CURETTAGE OF UTERUS     nexplanon  insertion     TONSILLECTOMY      OB History     Gravida  0   Para  0   Term  0   Preterm  0   AB  0   Living  0      SAB  0   IAB  0   Ectopic  0   Multiple  0   Live Births  0            Home Medications    Prior to Admission medications   Medication Sig Start Date End Date Taking? Authorizing Provider  methocarbamol  (ROBAXIN ) 500 MG tablet Take 1 tablet (500 mg total) by mouth 3 (three) times daily as needed. 12/09/23  Yes Teddy Sharper, FNP  predniSONE  (DELTASONE ) 10 MG tablet Take 6 tablets (60 mg total) by mouth daily for 2 days, THEN 5 tablets (50 mg total) daily for 2 days, THEN 4 tablets (40 mg total) daily for 2 days, THEN 3 tablets (30 mg total) daily for 2 days, THEN 2 tablets (20 mg total) daily for 2 days, THEN 1 tablet (10 mg total) daily for 2 days. 12/09/23 12/21/23  Yes Teddy Sharper, FNP  Continuous Glucose Sensor (FREESTYLE LIBRE 3 PLUS SENSOR) MISC Change sensor every 15 days. 08/12/23   Breeback, Jade L, PA-C  etonogestrel  (NEXPLANON ) 68 MG IMPL implant 1 each by Subdermal route once. 12/25/21   [provider]  hydroxychloroquine  (PLAQUENIL ) 200 MG tablet Take 1 tablet (200 mg total) by mouth 2 (two) times daily with food or milk. 10/08/23     Multiple Vitamins-Minerals (ZINC PO) Take by mouth as needed.    [provider]  tirzepatide  (MOUNJARO ) 10 MG/0.5ML Pen Liposlim.  Tirzepatide /Pyridoxine/Thiamine/L-Carnitine 10mg /mL.  Inject 2.5 mg/25 units subcu weekly for 4 weeks then 5 mg/50 units subcu weekly for 4 weeks then 7.5 mg/75 units subcu weekly for 4 weeks then 10 mg/100 units subcu weekly for 4 weeks  then 15 mg/150 units subcu weekly 09/15/23   Breeback, Jade L, PA-C  tirzepatide  (ZEPBOUND ) 2.5 MG/0.5ML Pen Inject 2.5 mg into the skin once a week. 09/18/23   Breeback, Jade L, PA-C  VITAMIN D  PO Take by mouth.    [provider]  ZEPBOUND  2.5 MG/0.5ML injection vial INJECT 0.5 ML (2.5 MG) UNDER THE SKIN ONCE WEEKLY (0.5ML= 50 UNITS) 10/29/23   Breeback, Vermell CROME, PA-C    Family History Family History  Problem Relation Age of Onset   Cancer Mother        brain   Hypertension Father    Diabetes Brother     Social History Social History   Tobacco Use   Smoking status: Never   Smokeless tobacco: Never  Vaping Use   Vaping status: Never Used  Substance Use Topics   Alcohol use: No   Drug use: No     Allergies   Aspirin, Augmentin  [amoxicillin -pot clavulanate], Ciprofloxacin , Hydrocodone , Ivp dye [iodinated contrast media], and Sulfa  antibiotics   Review of Systems Review of Systems  Musculoskeletal:  Positive for neck pain.     Physical Exam Triage Vital Signs ED Triage Vitals  Encounter Vitals Group     BP      Girls Systolic BP Percentile      Girls Diastolic BP Percentile      Boys Systolic BP Percentile      Boys Diastolic BP Percentile      Pulse      Resp      Temp      Temp src      SpO2      Weight      Height      Head Circumference      Peak Flow      Pain Score      Pain Loc      Pain Education      Exclude from Growth Chart    No data found.  Updated Vital Signs BP (!) 168/111   Pulse 88   Temp 98.2 F (36.8 C)   Resp 19   SpO2 98%    Physical Exam Vitals and nursing note reviewed.  Constitutional:      General: She is not in acute distress.    Appearance: Normal appearance. She is obese. She is ill-appearing.  HENT:     Head: Normocephalic and atraumatic.     Mouth/Throat:     Mouth: Mucous membranes are moist.     Pharynx: Oropharynx is clear.  Eyes:     Extraocular Movements: Extraocular movements intact.      Conjunctiva/sclera: Conjunctivae normal.     Pupils: Pupils are equal, round, and reactive to light.  Cardiovascular:     Rate and Rhythm: Normal rate and regular rhythm.     Pulses: Normal pulses.     Heart sounds: Normal heart sounds.  Pulmonary:     Effort: Pulmonary effort is normal.     Breath sounds: Normal breath sounds. No wheezing, rhonchi or rales.  Musculoskeletal:        General: Normal range of motion.     Cervical back: Normal range of motion and neck supple.     Comments: Posterior cervical spine: TTP over paraspinous muscles, palpable bilateral trapezius spasms noted  Skin:    General: Skin is warm and dry.  Neurological:     General: No focal deficit present.     Mental Status: She is alert and oriented to person, place, and time. Mental status is at baseline.  Psychiatric:        Mood and Affect: Mood normal.        Behavior: Behavior normal.      UC Treatments / Results  Labs (all labs ordered are listed, but only abnormal results are displayed) Labs Reviewed - No data to display  EKG   Radiology DG Cervical Spine Complete Result Date: 12/09/2023 CLINICAL DATA:  Neck pain radiating to the shoulders for 2 days EXAM: CERVICAL SPINE - COMPLETE 4+ VIEW COMPARISON:  04/05/2021 FINDINGS: Loss of the normal cervical lordosis, which can be associated with muscle spasm. Mild interbody spurring at C6-7 similar to previous. No prevertebral soft tissue swelling. The C7 spinous process is slightly more curved at its midpoint than seen on 04/05/2021. This may be incidental and projectional although strictly speaking I cannot exclude an interval healed clay-shoveler's fracture. IMPRESSION: 1. Loss of the normal cervical lordosis, which can be associated with muscle spasm. 2. Mild interbody spurring at C6-7. 3. The C7 spinous process is slightly more curved at its midpoint than seen on 04/05/2021. This may be incidental and projectional although strictly speaking I cannot exclude an  interval healed C7 spinous process fracture. Electronically Signed   By: Ryan Salvage M.D.   On: 12/09/2023 16:02    Procedures Procedures (including critical care time)  Medications Ordered in UC Medications  ketorolac  (TORADOL ) injection 60 mg (60 mg Intramuscular Given 12/09/23 1534)    Initial Impression / Assessment and Plan / UC Course  I have reviewed the triage vital signs and the nursing notes.  Pertinent labs & imaging results that were available during my care of the patient were reviewed by me and considered in my medical decision making (see chart for details).     MDM: 1.  Neck pain-cervical spine x-ray results revealed above, patient advised, IM Toradol  60 mg given once in clinic and prior to discharge; 2.  Cervical radiculopathy-Rx'd Sterapred Unipak (42 tab 10 mg taper); 3.  Muscle spasms of neck-Rx'd Robaxin  500 mg tablet: Take 1 tablet 3 times daily, as needed. Advised patient of cervical spine x-ray results with hardcopy and images provided.  Advised patient to take medication as directed with food to completion.  Advised may use Robaxin  daily or as needed for muscle spasms of neck.  Encouraged to increase daily water intake to 64 ounces per day while taking these medications.  Advised if symptoms worsen and/or unresolved please follow-up with your PCP or here for further evaluation.  Patient discharged home, hemodynamically stable. Final Clinical Impressions(s) / UC Diagnoses   Final diagnoses:  Neck pain  Cervical radiculopathy  Muscle spasms of neck     Discharge Instructions  Advised patient of cervical spine x-ray results with hardcopy and images provided.  Advised patient to take medication as directed with food to completion.  Advised may use Robaxin  daily or as needed for muscle spasms of neck.  Encouraged to increase daily water intake to 64 ounces per day while taking these medications.  Advised if symptoms worsen and/or unresolved please  follow-up with your PCP or here for further evaluation.     ED Prescriptions     Medication Sig Dispense Auth. Provider   predniSONE  (DELTASONE ) 10 MG tablet Take 6 tablets (60 mg total) by mouth daily for 2 days, THEN 5 tablets (50 mg total) daily for 2 days, THEN 4 tablets (40 mg total) daily for 2 days, THEN 3 tablets (30 mg total) daily for 2 days, THEN 2 tablets (20 mg total) daily for 2 days, THEN 1 tablet (10 mg total) daily for 2 days. 42 tablet Bowen Kia, FNP   methocarbamol  (ROBAXIN ) 500 MG tablet Take 1 tablet (500 mg total) by mouth 3 (three) times daily as needed. 21 tablet Pailyn Bellevue, FNP      PDMP not reviewed this encounter.   Teddy Sharper, FNP 12/09/23 [phone]

## 2023-12-09 NOTE — Telephone Encounter (Signed)
 FYI Only or Action Required?: Action required by provider: request for appointment. Requesting call back for PCP recommendation  Patient was last seen in primary care on 09/17/2023 by Curtis Debby PARAS, MD.  Called Nurse Triage reporting Neck Pain.  Symptoms began several days ago.  Interventions attempted: Rest, hydration, or home remedies.  Symptoms are: rapidly worsening.  Triage Disposition: See HCP Within 4 Hours (Or PCP Triage)  Patient/caregiver understands and will follow disposition?: No, wishes to speak with PCP    Copied from CRM [id number]. Topic: Clinical - Red Word Triage >> Dec 09, 2023 10:16 AM Carrielelia G wrote: Red Word that prompted transfer to Nurse Triage: severe neck pain. Reason for Disposition  [1] SEVERE neck pain (e.g., excruciating, unable to do any normal activities) AND [2] not improved after 2 hours of pain medicine  Answer Assessment - Initial Assessment Questions No available appt today . Recommended UC/ mobile bus or [facility]. If sx worsen go to ED. Patient requesting if anyone from practice can see her today . Patient already scheduled herself appt for 12/11/23. Recommended patient not wait to be evaluated due to severe pain. Please call patient back.  Reports head feels like 6,000 lbs at times.    1. ONSET: When did the pain begin?      4 days  2. LOCATION: Where does it hurt?      Base of neck, to right shoulder  3. PATTERN Does the pain come and go, or has it been constant since it started?      Comes and goes  4. SEVERITY: How bad is the pain?  (Scale 0-10; or none or slight stiffness, mild, moderate, severe)     Severe at times 5. RADIATION: Does the pain go anywhere else, shoot into your arms?     Right shoulder and upper back  6. CORD SYMPTOMS: Any weakness or numbness of the arms or legs?     Right arm pain 7. CAUSE: What do you think is causing the neck pain?     Not sure  8. NECK OVERUSE: Any recent  activities that involved turning or twisting the neck?     na 9. OTHER SYMPTOMS: Do you have any other symptoms? (e.g., headache, fever, chest pain, difficulty breathing, neck swelling)     Neck swelling to right shoulder , reaching with right shoulder causes pain and feels like taser.  10. PREGNANCY: Is there any chance you are pregnant? When was your last menstrual period?       na  Protocols used: Neck Pain or Stiffness-A-AH

## 2023-12-09 NOTE — ED Triage Notes (Signed)
 Pt presents to uc with co neck pain. Back pain in the middle of her shoulder blades since monday Pt reports she has a history of left shoulder  pain which was hurting about 2-3 weeks prior to this. Pt has been using tyelnol , ice and heat.

## 2023-12-10 ENCOUNTER — Other Ambulatory Visit (HOSPITAL_BASED_OUTPATIENT_CLINIC_OR_DEPARTMENT_OTHER): Payer: Self-pay

## 2023-12-11 ENCOUNTER — Ambulatory Visit: Admitting: Physician Assistant

## 2023-12-11 VITALS — BP 132/88 | HR 88 | Wt 275.0 lb

## 2023-12-11 DIAGNOSIS — M62838 Other muscle spasm: Secondary | ICD-10-CM

## 2023-12-11 DIAGNOSIS — E8881 Metabolic syndrome: Secondary | ICD-10-CM | POA: Diagnosis not present

## 2023-12-11 DIAGNOSIS — Z6841 Body Mass Index (BMI) 40.0 and over, adult: Secondary | ICD-10-CM

## 2023-12-11 DIAGNOSIS — G4733 Obstructive sleep apnea (adult) (pediatric): Secondary | ICD-10-CM | POA: Diagnosis not present

## 2023-12-11 DIAGNOSIS — E66813 Obesity, class 3: Secondary | ICD-10-CM | POA: Diagnosis not present

## 2023-12-11 NOTE — Progress Notes (Signed)
   Established Patient Office Visit  Subjective   Patient ID: Jennifer Erickson, female    DOB: 1970/09/06  Age: 53 y.o. MRN: [id number]  Chief Complaint  Patient presents with   Neck Pain    HPI Pt is a 53 yo obese female who presents to the clinic to follow up after UC visit on 9/10 for upper neck pain and decrease ROM of neck for the last 2 weeks. No injury or trauma.  Xray done showed muscle spasm for neck. Toradol  60mg  given in office and sent home with steroid and muscle relaxer. She has not been able to go to work all this week due to exacerbation of pain with lifting.   She has been paying self pay prices for zepbound  2.5mg  but the price was going to go up with the 5mg  dose. She would like to have rx sent to [facility]ep[facility]ide  [facility] to continue. She has lost 12lbs over 2 months.    ROS See HPI.    Objective:     BP 132/88   Pulse 88   Wt 275 lb (124.7 kg)   SpO2 99%   BMI 44.39 kg/m  BP Readings from Last 3 Encounters:  12/11/23 132/88  12/09/23 (!) 168/111  11/17/23 127/88   Wt Readings from Last 3 Encounters:  12/11/23 275 lb (124.7 kg)  09/15/23 287 lb (130.2 kg)  03/17/23 269 lb 1 oz (122 kg)      Physical Exam Constitutional:      Appearance: Normal appearance.  HENT:     Head: Normocephalic.  Neck:     Comments: Some pain with ROM but able to move neck fully.  No pain to palpation over cervical spine.  Tight and tender to palpation cervical paraspinal muscles to palpation.  Cardiovascular:     Rate and Rhythm: Normal rate and regular rhythm.  Pulmonary:     Effort: Pulmonary effort is normal.     Breath sounds: Normal breath sounds.  Musculoskeletal:     Cervical back: Normal range of motion. No tenderness.  Lymphadenopathy:     Cervical: No cervical adenopathy.  Neurological:     General: No focal deficit present.     Mental Status: She is alert and oriented to person, place, and time.  Psychiatric:        Mood  and Affect: Mood normal.       The 10-year ASCVD risk score (Arnett DK, et al., 2019) is: 2.7%    Assessment & Plan:  SABRASABRATonya [name] was seen today for neck pain.  Diagnoses and all orders for this visit:  Cervical paraspinal muscle spasm  Class 3 severe obesity due to excess calories with serious comorbidity and body mass index (BMI) of 40.0 to 44.9 in adult   Note given to be out of work this week and going back Sunday.  Continue prednisone  to completion.  Use muscle relaxer as needed Discussed tens unit, icy hot patches, heating pads as needed Neck exercises given to start Consider massage therapy  [facility] 5[facility]  weekly dosing for tirzepatide  written for 3 months Continue with healthy diet and regular exercise Starting weight: 288 Weight today: 275 Down 12lbs Follow up in 3 months Return in about 3 months (around 03/11/2024), or if symptoms worsen or fail to improve.    Tynesha Free, PA-C

## 2023-12-14 ENCOUNTER — Encounter: Payer: Self-pay | Admitting: Physician Assistant

## 2023-12-14 MED ORDER — TIRZEPATIDE 10 MG/0.5ML ~~LOC~~ SOAJ: SUBCUTANEOUS | Status: AC

## 2023-12-16 ENCOUNTER — Ambulatory Visit: Admitting: Physician Assistant

## 2023-12-16 ENCOUNTER — Other Ambulatory Visit (HOSPITAL_BASED_OUTPATIENT_CLINIC_OR_DEPARTMENT_OTHER): Payer: Self-pay

## 2023-12-16 ENCOUNTER — Encounter: Payer: Self-pay | Admitting: Physician Assistant

## 2023-12-16 DIAGNOSIS — Z975 Presence of (intrauterine) contraceptive device: Secondary | ICD-10-CM | POA: Diagnosis not present

## 2023-12-16 DIAGNOSIS — Z01419 Encounter for gynecological examination (general) (routine) without abnormal findings: Secondary | ICD-10-CM | POA: Diagnosis not present

## 2023-12-16 DIAGNOSIS — Z1330 Encounter for screening examination for mental health and behavioral disorders, unspecified: Secondary | ICD-10-CM | POA: Diagnosis not present

## 2023-12-16 DIAGNOSIS — Z124 Encounter for screening for malignant neoplasm of cervix: Secondary | ICD-10-CM | POA: Diagnosis not present

## 2023-12-16 MED ORDER — NEFFY 1 MG/0.1ML NA SOLN
1.0000 | Freq: Once | NASAL | 1 refills | Status: AC
  Filled 2023-12-16: qty 2, 30d supply, fill #0

## 2023-12-17 ENCOUNTER — Other Ambulatory Visit (HOSPITAL_BASED_OUTPATIENT_CLINIC_OR_DEPARTMENT_OTHER): Payer: Self-pay

## 2023-12-18 ENCOUNTER — Other Ambulatory Visit (HOSPITAL_BASED_OUTPATIENT_CLINIC_OR_DEPARTMENT_OTHER): Payer: Self-pay

## 2023-12-18 ENCOUNTER — Ambulatory Visit: Admitting: Sports Medicine

## 2023-12-21 ENCOUNTER — Other Ambulatory Visit (HOSPITAL_BASED_OUTPATIENT_CLINIC_OR_DEPARTMENT_OTHER): Payer: Self-pay

## 2023-12-21 MED ORDER — EPINEPHRINE 0.3 MG/0.3ML IJ SOAJ
0.3000 mg | Freq: Once | INTRAMUSCULAR | 1 refills | Status: AC
Start: 2023-12-21 — End: 2023-12-23
  Filled 2023-12-21: qty 2, 2d supply, fill #0

## 2023-12-21 NOTE — Addendum Note (Signed)
 Addended by: [name] on: 12/21/2023 04:53 PM   Modules accepted: Orders

## 2023-12-31 ENCOUNTER — Ambulatory Visit: Payer: Self-pay

## 2023-12-31 ENCOUNTER — Ambulatory Visit

## 2023-12-31 ENCOUNTER — Other Ambulatory Visit (HOSPITAL_BASED_OUTPATIENT_CLINIC_OR_DEPARTMENT_OTHER): Payer: Self-pay

## 2023-12-31 ENCOUNTER — Ambulatory Visit
Admission: EM | Admit: 2023-12-31 | Discharge: 2023-12-31 | Disposition: A | Discharge status: Home / Self Care | Attending: Family Medicine | Admitting: Family Medicine

## 2023-12-31 ENCOUNTER — Other Ambulatory Visit: Payer: Self-pay

## 2023-12-31 DIAGNOSIS — M79671 Pain in right foot: Secondary | ICD-10-CM | POA: Diagnosis not present

## 2023-12-31 DIAGNOSIS — M7731 Calcaneal spur, right foot: Secondary | ICD-10-CM | POA: Diagnosis not present

## 2023-12-31 MED ORDER — MELOXICAM 15 MG PO TABS
15.0000 mg | ORAL_TABLET | Freq: Every day | ORAL | 0 refills | Status: DC
  Filled 2023-12-31: qty 15, 15d supply, fill #0

## 2023-12-31 NOTE — Discharge Instructions (Signed)
 Take meloxicam  once a day Wear a firm bottom shoe Try to limit walking while foot is painful Follow-up with podiatry or sports medicine

## 2023-12-31 NOTE — Telephone Encounter (Signed)
 The patient has an appointment scheduled with Benton Gave on 12/02/23 for the following symptoms.

## 2023-12-31 NOTE — Telephone Encounter (Signed)
 FYI Only or Action Required?: FYI only for provider.  Patient was last seen in primary care on 12/11/2023 by [name], PA-C.  Called Nurse Triage reporting Foot Pain.  Symptoms began yesterday.  Interventions attempted: OTC medications: Advil  with acetaminophen .  Symptoms are: gradually worsening.  Triage Disposition: See PCP When Office is Open (Within 3 Days)  Patient/caregiver understands and will follow disposition?: Yes        Copied from CRM #[id number]. Topic: Clinical - Red Word Triage >> Dec 31, 2023  9:49 AM Ivette P wrote: [facility] that prompted transfer to Nurse Triage:pt wants app with any provider    foot pain - right Reason for Disposition  [1] MODERATE pain (e.g., interferes with normal activities, limping) AND [2] present > 3 days    Pain started yesterday.  Answer Assessment - Initial Assessment Questions Patient taking Advil  with acetaminophen  for symptoms and states the pain is still there. Patient denies numbness, weakness or rash in the area. Pain is increased when putting on shoes.   1. ONSET: When did the pain start?      Yesterday  2. LOCATION: Where is the pain located?      R foot along R side near toes and at the bottom of her foot 3. PAIN: How bad is the pain?    (Scale 1-10; or mild, moderate, severe)     7/10  4. OTHER SYMPTOMS: Do you have any other symptoms? (e.g., leg pain, rash, fever, numbness)     Denies  Protocols used: Foot Pain-A-AH

## 2023-12-31 NOTE — ED Provider Notes (Signed)
 [facility]    CSN: [id number] Arrival date & time: 12/31/23  1400      History   Chief Complaint Chief Complaint  Patient presents with   Foot Pain    HPI Jennifer Erickson is a 53 y.o. female.   Patient is on her feet a lot at work.  She states that she has to be careful rotating a number of shoes and wear shoe inserts.  She has chronic problems with her feet including chronic plantar fasciitis.  She is also had problems with heel pain in the past.  Currently has a new pain.  Right foot only.  Her lateral foot (points to fifth metatarsal) is very painful.  No accident or injury.  No change in shoewear or activity.  Has never had a stress fracture.  That she knows of her bones are in good condition.  She has not had a menstrual period in many years because of her birth control choice.  She does have a history of vitamin D  deficiency.  Non-smoker    Past Medical History:  Diagnosis Date   Lupus    Personal history of amenorrhea    Sinusitis     Patient Active Problem List   Diagnosis Date Noted   Chronic fatigue 09/15/2023   Sinobronchitis 10/21/2022   Sweating increase 10/14/2022   Leg cramps 10/14/2022   Chronic venous stasis 09/08/2022   Elevated hemoglobin A1c 09/05/2022   Biceps tendinitis, right, distal 07/18/2022   Bilateral hamstring strain 07/18/2022   Viral infection 11/01/2021   Radiculitis of right cervical region 10/11/2021   Laceration of scalp 04/23/2021   Pre-diabetes 09/17/2020   PND (post-nasal drip) 01/24/2020   Trochanteric bursitis, right hip 01/04/2020   Numbness and tingling in both hands 08/18/2019   Lumbar degenerative disc disease 06/06/2019   Asteatosis 03/23/2019   Metabolic syndrome with hyperglycemia 07/23/2018   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   Alopecia, 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   Elevated blood pressure reading 09/24/2017   Cough 07/03/2017   Left shoulder pain 12/03/2016   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    Palpitations 07/11/2015   OSA on CPAP 07/10/2015   Large breasts 05/28/2015   Left tennis elbow 05/28/2015   Primary osteoarthritis of both knees 12/08/2014   Vitamin D  deficiency 04/19/2014   Bilateral swelling of feet 05/02/2013   Left breast mass 11/22/2012   Plantar fasciitis, bilateral 11/01/2012   Class 3 severe obesity due to excess calories with serious comorbidity and body mass index (BMI) of 45.0 to 49.9 in adult [facility]) 08/19/2011   Uvular swelling 08/19/2011    Past Surgical History:  Procedure Laterality Date   DILATION AND CURETTAGE OF UTERUS     nexplanon  insertion     TONSILLECTOMY      OB History     Gravida  0   Para  0   Term  0   Preterm  0   AB  0   Living  0      SAB  0   IAB  0   Ectopic  0   Multiple  0   Live Births  0            Home Medications    Prior to Admission medications   Medication Sig Start Date End Date Taking? Authorizing Provider  meloxicam  (MOBIC ) 15 MG tablet Take 1 tablet (15 mg total) by mouth daily. 12/31/23  Yes Maranda Jamee Jacob, MD  Continuous Glucose Sensor (FREESTYLE LIBRE 3 PLUS SENSOR)  MISC Change sensor every 15 days. 08/12/23   Breeback, Jade L, PA-C  etonogestrel  (NEXPLANON ) 68 MG IMPL implant 1 each by Subdermal route once. 12/25/21   [provider]  hydroxychloroquine  (PLAQUENIL ) 200 MG tablet Take 1 tablet (200 mg total) by mouth 2 (two) times daily with food or milk. 10/08/23     Multiple Vitamins-Minerals (ZINC PO) Take by mouth as needed.    [provider]  tirzepatide  (MOUNJARO ) 10 MG/0.5ML Pen Liposlim.  Tirzepatide /Pyridoxine/Thiamine/L-Carnitine 10mg /mL.  Inject 2.5 mg/25 units subcu weekly for 4 weeks then 5 mg/50 units subcu weekly for 4 weeks then 7.5 mg/75 units subcu weekly for 4 weeks then 10 mg/100 units subcu weekly for 4 weeks then 15 mg/150 units subcu weekly 12/14/23   Breeback, Jade L, PA-C  VITAMIN D  PO Take by mouth.    [provider]    Family  History Family History  Problem Relation Age of Onset   Cancer Mother        brain   Hypertension Father    Diabetes Brother     Social History Social History   Tobacco Use   Smoking status: Never   Smokeless tobacco: Never  Vaping Use   Vaping status: Never Used  Substance Use Topics   Alcohol use: No   Drug use: No     Allergies   Aspirin, Augmentin  [amoxicillin -pot clavulanate], Ciprofloxacin , Hydrocodone , Ivp dye [iodinated contrast media], and Sulfa  antibiotics   Review of Systems Review of Systems  See HPI Physical Exam Triage Vital Signs ED Triage Vitals  Encounter Vitals Group     BP 12/31/23 1406 137/88     Girls Systolic BP Percentile --      Girls Diastolic BP Percentile --      Boys Systolic BP Percentile --      Boys Diastolic BP Percentile --      Pulse Rate 12/31/23 1406 95     Resp 12/31/23 1406 16     Temp 12/31/23 1406 98.1 F (36.7 C)     Temp src --      SpO2 12/31/23 1406 97 %     Weight --      Height --      Head Circumference --      Peak Flow --      Pain Score 12/31/23 1410 7     Pain Loc --      Pain Education --      Exclude from Growth Chart --    No data found.  Updated Vital Signs BP 137/88   Pulse 95   Temp 98.1 F (36.7 C)   Resp 16   SpO2 97%      Physical Exam Constitutional:      General: She is not in acute distress.    Appearance: She is well-developed. She is obese.  HENT:     Head: Normocephalic and atraumatic.  Eyes:     Conjunctiva/sclera: Conjunctivae normal.     Pupils: Pupils are equal, round, and reactive to light.  Cardiovascular:     Rate and Rhythm: Normal rate.  Pulmonary:     Effort: Pulmonary effort is normal. No respiratory distress.  Abdominal:     General: There is no distension.     Palpations: Abdomen is soft.  Musculoskeletal:        General: Normal range of motion.     Cervical back: Normal range of motion.       Feet:  Skin:  General: Skin is warm and dry.   Neurological:     Mental Status: She is alert.      UC Treatments / Results  Labs (all labs ordered are listed, but only abnormal results are displayed) Labs Reviewed - No data to display  EKG   Radiology DG Foot Complete Right Result Date: 12/31/2023 CLINICAL DATA:  Right foot pain. EXAM: RIGHT FOOT COMPLETE - 3+ VIEW COMPARISON:  04/15/2016 FINDINGS: There is no evidence of acute fracture or dislocation. No bony lesions or destruction. No significant degenerative disease. Slightly increased prominence of a plantar calcaneal spur since 2018. Soft tissues are unremarkable. IMPRESSION: No acute findings. Slightly increased prominence of a plantar calcaneal spur since 2018. Electronically Signed   By: Marcey Moan M.D.   On: 12/31/2023 15:13    Procedures Procedures (including critical care time)  Medications Ordered in UC Medications - No data to display  Initial Impression / Assessment and Plan / UC Course  I have reviewed the triage vital signs and the nursing notes.  Pertinent labs & imaging results that were available during my care of the patient were reviewed by me and considered in my medical decision making (see chart for details).     Metatarsal pain.  I explained that stress fractures are difficult to tell on initial x-rays and if her pain persist she needs to get additional x-rays or imaging.  Recommend limited weightbearing, ice, anti-inflammatories for now. Final Clinical Impressions(s) / UC Diagnoses   Final diagnoses:  Acute pain of right foot     Discharge Instructions      Take meloxicam  once a day Wear a firm bottom shoe Try to limit walking while foot is painful Follow-up with podiatry or sports medicine     ED Prescriptions     Medication Sig Dispense Auth. Provider   meloxicam  (MOBIC ) 15 MG tablet Take 1 tablet (15 mg total) by mouth daily. 15 tablet Maranda Jamee Jacob, MD      PDMP not reviewed this encounter.   Maranda Jamee Jacob,  MD 12/31/23 [phone]

## 2023-12-31 NOTE — ED Triage Notes (Signed)
 Has c/o right foot pain, no injury. Reports feet always hurt. Pain was severe since yesterday. Reports pain is to the edge of foot and to 3rd and 4th toes. No otc meds today but took advil  with acetaminophen  yesterday.

## 2024-01-01 ENCOUNTER — Ambulatory Visit: Admitting: Urgent Care

## 2024-01-12 ENCOUNTER — Other Ambulatory Visit (HOSPITAL_BASED_OUTPATIENT_CLINIC_OR_DEPARTMENT_OTHER): Payer: Self-pay

## 2024-01-21 ENCOUNTER — Other Ambulatory Visit (HOSPITAL_BASED_OUTPATIENT_CLINIC_OR_DEPARTMENT_OTHER): Payer: Self-pay

## 2024-02-05 ENCOUNTER — Encounter: Payer: Self-pay | Admitting: Physician Assistant

## 2024-03-11 ENCOUNTER — Ambulatory Visit: Admitting: Physician Assistant

## 2024-04-09 ENCOUNTER — Ambulatory Visit
Admission: RE | Admit: 2024-04-09 | Discharge: 2024-04-09 | Disposition: A | Discharge status: Home / Self Care | Source: Ambulatory Visit | Attending: Family Medicine | Admitting: Family Medicine

## 2024-04-09 ENCOUNTER — Other Ambulatory Visit: Payer: Self-pay

## 2024-04-09 VITALS — BP 132/94 | HR 89 | Temp 98.9°F | Resp 17 | Wt 260.5 lb

## 2024-04-09 DIAGNOSIS — N10 Acute tubulo-interstitial nephritis: Secondary | ICD-10-CM

## 2024-04-09 LAB — POCT URINE DIPSTICK
Bilirubin, UA: NEGATIVE
Glucose, UA: NEGATIVE mg/dL
Ketones, POC UA: NEGATIVE mg/dL
Nitrite, UA: POSITIVE — AB
POC PROTEIN,UA: NEGATIVE
Spec Grav, UA: 1.02
Urobilinogen, UA: 0.2 U/dL
pH, UA: 7

## 2024-04-09 MED ORDER — CEFTRIAXONE SODIUM 1 G IJ SOLR
1000.0000 mg | Freq: Once | INTRAMUSCULAR | Status: AC
  Administered 2024-04-09: 1000 mg via INTRAMUSCULAR

## 2024-04-09 MED ORDER — CEFPODOXIME PROXETIL 200 MG PO TABS: 200.0000 mg | ORAL_TABLET | Freq: Two times a day (BID) | ORAL | 0 refills | Status: AC

## 2024-04-09 NOTE — ED Provider Notes (Signed)
 " [name]    CSN: [id number] Arrival date & time: 04/09/24  0931      History   Chief Complaint Chief Complaint  Patient presents with   Flank Pain   Nausea    HPI JONNETTE NUON is a 54 y.o. female.   Patient is worried she has a kidney infection.  She has had a kidney infection before.  She states she has worsening mid and low back pain.  Some nausea.  No urinary symptoms.  Has noticed some Vaginal spotting although she is perimenopausal.  No fever or chills. Patient does have a history of lupus Patient has multiple drug allergies intolerance including Augmentin , Septra , and ciprofloxacin     Past Medical History:  Diagnosis Date   Lupus    Personal history of amenorrhea    Sinusitis     Patient Active Problem List   Diagnosis Date Noted   Chronic fatigue 09/15/2023   Sinobronchitis 10/21/2022   Sweating increase 10/14/2022   Leg cramps 10/14/2022   Chronic venous stasis 09/08/2022   Elevated hemoglobin A1c 09/05/2022   Biceps tendinitis, right, distal 07/18/2022   Bilateral hamstring strain 07/18/2022   Viral infection 11/01/2021   Radiculitis of right cervical region 10/11/2021   Laceration of scalp 04/23/2021   Pre-diabetes 09/17/2020   PND (post-nasal drip) 01/24/2020   Trochanteric bursitis, right hip 01/04/2020   Numbness and tingling in both hands 08/18/2019   Lumbar degenerative disc disease 06/06/2019   Asteatosis 03/23/2019   Metabolic syndrome with hyperglycemia 07/23/2018   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   Alopecia, 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   Elevated blood pressure reading 09/24/2017   Cough 07/03/2017   Left shoulder pain 12/03/2016   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   Palpitations 07/11/2015   OSA on CPAP 07/10/2015   Large breasts 05/28/2015   Left tennis elbow 05/28/2015   Primary osteoarthritis of both knees 12/08/2014   Vitamin D  deficiency 04/19/2014   Bilateral swelling of feet 05/02/2013   Left breast mass  11/22/2012   Plantar fasciitis, bilateral 11/01/2012   Class 3 severe obesity due to excess calories with serious comorbidity and body mass index (BMI) of 45.0 to 49.9 in adult [facility]) 08/19/2011   Uvular swelling 08/19/2011    Past Surgical History:  Procedure Laterality Date   DILATION AND CURETTAGE OF UTERUS     nexplanon  insertion     TONSILLECTOMY      OB History     Gravida  0   Para  0   Term  0   Preterm  0   AB  0   Living  0      SAB  0   IAB  0   Ectopic  0   Multiple  0   Live Births  0            Home Medications    Prior to Admission medications  Medication Sig Start Date End Date Taking? Authorizing Provider  cefpodoxime  (VANTIN ) 200 MG tablet Take 1 tablet (200 mg total) by mouth 2 (two) times daily. 04/09/24  Yes Maranda Jamee Jacob, MD  Continuous Glucose Sensor (FREESTYLE LIBRE 3 PLUS SENSOR) MISC Change sensor every 15 days. 08/12/23   Breeback, Jade L, PA-C  etonogestrel  (NEXPLANON ) 68 MG IMPL implant 1 each by Subdermal route once. 12/25/21   [provider]  hydroxychloroquine  (PLAQUENIL ) 200 MG tablet Take 1 tablet (200 mg total) by mouth 2 (two) times daily with food or milk. 10/08/23  Multiple Vitamins-Minerals (ZINC PO) Take by mouth as needed.    [provider]  tirzepatide  (MOUNJARO ) 10 MG/0.5ML Pen Liposlim.  Tirzepatide /Pyridoxine/Thiamine/L-Carnitine 10mg /mL.  Inject 2.5 mg/25 units subcu weekly for 4 weeks then 5 mg/50 units subcu weekly for 4 weeks then 7.5 mg/75 units subcu weekly for 4 weeks then 10 mg/100 units subcu weekly for 4 weeks then 15 mg/150 units subcu weekly 12/14/23   Breeback, Jade L, PA-C  VITAMIN D  PO Take by mouth.    [provider]    Family History Family History  Problem Relation Age of Onset   Cancer Mother        brain   Hypertension Father    Diabetes Brother     Social History Social History[1]   Allergies   Aspirin, Augmentin  [amoxicillin -pot clavulanate],  Ciprofloxacin , Hydrocodone , Ivp dye [iodinated contrast media], and Sulfa  antibiotics   Review of Systems Review of Systems See HPI  Physical Exam Triage Vital Signs ED Triage Vitals  Encounter Vitals Group     BP 04/09/24 0945 (!) 132/94     Girls Systolic BP Percentile --      Girls Diastolic BP Percentile --      Boys Systolic BP Percentile --      Boys Diastolic BP Percentile --      Pulse Rate 04/09/24 0945 89     Resp 04/09/24 0945 17     Temp 04/09/24 0945 98.9 F (37.2 C)     Temp Source 04/09/24 0945 Oral     SpO2 04/09/24 0945 100 %     Weight 04/09/24 0947 260 lb 8 oz (118.2 kg)     Height --      Head Circumference --      Peak Flow --      Pain Score 04/09/24 0946 4     Pain Loc --      Pain Education --      Exclude from Growth Chart --    No data found.  Updated Vital Signs BP (!) 132/94 (BP Location: Right Arm)   Pulse 89   Temp 98.9 F (37.2 C) (Oral)   Resp 17   Wt 118.2 kg   SpO2 100%   BMI 42.05 kg/m    Physical Exam Constitutional:      General: She is not in acute distress.    Appearance: She is well-developed. She is obese. She is not ill-appearing.  HENT:     Head: Normocephalic and atraumatic.  Eyes:     Conjunctiva/sclera: Conjunctivae normal.     Pupils: Pupils are equal, round, and reactive to light.  Cardiovascular:     Rate and Rhythm: Normal rate.  Pulmonary:     Effort: Pulmonary effort is normal. No respiratory distress.  Abdominal:     General: There is no distension.     Palpations: Abdomen is soft.     Tenderness: There is no abdominal tenderness. There is right CVA tenderness and left CVA tenderness.  Musculoskeletal:        General: Tenderness present. Normal range of motion.     Cervical back: Normal range of motion.     Comments: Mild CVA tenderness.  Also tenderness across low back and posterior pelvis.  Skin:    General: Skin is warm and dry.  Neurological:     Mental Status: She is alert.      UC  Treatments / Results  Labs (all labs ordered are listed, but only abnormal results are displayed)  Labs Reviewed  POCT URINE DIPSTICK - Abnormal; Notable for the following components:      Result Value   Clarity, UA cloudy (*)    Blood, UA trace-lysed (*)    Nitrite, UA Positive (*)    Leukocytes, UA Moderate (2+) (*)    All other components within normal limits  URINE CULTURE    EKG   Radiology No results found.  Procedures Procedures (including critical care time)  Medications Ordered in UC Medications  cefTRIAXone  (ROCEPHIN ) injection 1,000 mg (1,000 mg Intramuscular Given 04/09/24 1019)    Initial Impression / Assessment and Plan / UC Course  I have reviewed the triage vital signs and the nursing notes.  Pertinent labs & imaging results that were available during my care of the patient were reviewed by me and considered in my medical decision making (see chart for details).     Urinalysis is positive for infection.  Her symptoms are likely cystitis, however I am concerned with her nausea and back pain.  Out of caution we will treat her for pyelonephritis. Final Clinical Impressions(s) / UC Diagnoses   Final diagnoses:  Acute pyelonephritis     Discharge Instructions      Drink lots of fluids Take the antibiotic Vantin  (cefpodoxime ) 2 times a day for 10 days May take Tylenol , ibuprofen , naproxen  for pain If you become worse instead of better with high fever or vomiting you must go to an emergency room  Your urine has been sent to the laboratory for culture.  You be called if any change in antibiotics is necessary   ED Prescriptions     Medication Sig Dispense Auth. Provider   cefpodoxime  (VANTIN ) 200 MG tablet Take 1 tablet (200 mg total) by mouth 2 (two) times daily. 20 tablet Maranda Jamee Jacob, MD      PDMP not reviewed this encounter.    [1]  Social History Tobacco Use   Smoking status: Never   Smokeless tobacco: Never  Vaping Use   Vaping  status: Never Used  Substance Use Topics   Alcohol use: No   Drug use: No     Maranda Jamee Jacob, MD 04/09/24 1043  "

## 2024-04-09 NOTE — ED Triage Notes (Signed)
 Pt c/o flank pain and lower back pain x 4-5 days. Some urinary frequency. Hx of kidney stones.

## 2024-04-09 NOTE — Discharge Instructions (Signed)
 Drink lots of fluids Take the antibiotic Vantin  (cefpodoxime ) 2 times a day for 10 days May take Tylenol , ibuprofen , naproxen  for pain If you become worse instead of better with high fever or vomiting you must go to an emergency room  Your urine has been sent to the laboratory for culture.  You be called if any change in antibiotics is necessary

## 2024-04-10 ENCOUNTER — Encounter: Payer: Self-pay | Admitting: Physician Assistant

## 2024-04-12 ENCOUNTER — Ambulatory Visit (HOSPITAL_COMMUNITY): Payer: Self-pay

## 2024-04-12 ENCOUNTER — Other Ambulatory Visit (HOSPITAL_BASED_OUTPATIENT_CLINIC_OR_DEPARTMENT_OTHER): Payer: Self-pay

## 2024-04-12 LAB — URINE CULTURE: Culture: >=100000 — AB

## 2024-04-12 MED ORDER — TRAMADOL HCL 50 MG PO TABS
50.0000 mg | ORAL_TABLET | Freq: Four times a day (QID) | ORAL | 0 refills | Status: AC | PRN
  Filled 2024-04-12: qty 21, 6d supply, fill #0

## 2024-04-15 ENCOUNTER — Other Ambulatory Visit (HOSPITAL_BASED_OUTPATIENT_CLINIC_OR_DEPARTMENT_OTHER): Payer: Self-pay
